# Patient Record
Sex: Male | Born: 1935 | Race: White | Hispanic: No | Marital: Married | Smoking: Never smoker
Health system: Southern US, Community
[De-identification: ages and names within clinical notes are randomized; demographics above are authoritative.]

## PROBLEM LIST (undated history)

## (undated) DIAGNOSIS — I4819 Other persistent atrial fibrillation: Secondary | ICD-10-CM

## (undated) DIAGNOSIS — N400 Enlarged prostate without lower urinary tract symptoms: Secondary | ICD-10-CM

## (undated) DIAGNOSIS — Z952 Presence of prosthetic heart valve: Secondary | ICD-10-CM

## (undated) DIAGNOSIS — E039 Hypothyroidism, unspecified: Secondary | ICD-10-CM

## (undated) DIAGNOSIS — I10 Essential (primary) hypertension: Secondary | ICD-10-CM

## (undated) DIAGNOSIS — I472 Ventricular tachycardia: Secondary | ICD-10-CM

## (undated) DIAGNOSIS — Z87442 Personal history of urinary calculi: Secondary | ICD-10-CM

## (undated) DIAGNOSIS — E785 Hyperlipidemia, unspecified: Secondary | ICD-10-CM

## (undated) DIAGNOSIS — M1990 Unspecified osteoarthritis, unspecified site: Secondary | ICD-10-CM

## (undated) DIAGNOSIS — I2510 Atherosclerotic heart disease of native coronary artery without angina pectoris: Secondary | ICD-10-CM

## (undated) DIAGNOSIS — G709 Myoneural disorder, unspecified: Secondary | ICD-10-CM

## (undated) DIAGNOSIS — K219 Gastro-esophageal reflux disease without esophagitis: Secondary | ICD-10-CM

## (undated) DIAGNOSIS — I4729 Other ventricular tachycardia: Secondary | ICD-10-CM

## (undated) DIAGNOSIS — I5022 Chronic systolic (congestive) heart failure: Secondary | ICD-10-CM

## (undated) DIAGNOSIS — I442 Atrioventricular block, complete: Secondary | ICD-10-CM

## (undated) DIAGNOSIS — G629 Polyneuropathy, unspecified: Secondary | ICD-10-CM

## (undated) DIAGNOSIS — L409 Psoriasis, unspecified: Secondary | ICD-10-CM

## (undated) HISTORY — DX: Psoriasis, unspecified: L40.9

## (undated) HISTORY — DX: Other ventricular tachycardia: I47.29

## (undated) HISTORY — DX: Ventricular tachycardia: I47.2

## (undated) HISTORY — DX: Atherosclerotic heart disease of native coronary artery without angina pectoris: I25.10

## (undated) HISTORY — PX: OTHER SURGICAL HISTORY: SHX169

## (undated) HISTORY — PX: KIDNEY STONE SURGERY: SHX686

## (undated) HISTORY — DX: Presence of prosthetic heart valve: Z95.2

## (undated) HISTORY — PX: CATARACT EXTRACTION, BILATERAL: SHX1313

## (undated) HISTORY — DX: Hyperlipidemia, unspecified: E78.5

## (undated) HISTORY — PX: LUMBAR DISC SURGERY: SHX700

## (undated) HISTORY — DX: Chronic systolic (congestive) heart failure: I50.22

## (undated) HISTORY — PX: CARPAL TUNNEL RELEASE: SHX101

## (undated) HISTORY — DX: Other persistent atrial fibrillation: I48.19

## (undated) HISTORY — PX: BACK SURGERY: SHX140

## (undated) HISTORY — DX: Essential (primary) hypertension: I10

## (undated) HISTORY — PX: APPENDECTOMY: SHX54

## (undated) HISTORY — PX: CORONARY ANGIOPLASTY WITH STENT PLACEMENT: SHX49

## (undated) HISTORY — DX: Atrioventricular block, complete: I44.2

## (undated) HISTORY — PX: ANTERIOR CERVICAL DECOMP/DISCECTOMY FUSION: SHX1161

## (undated) HISTORY — PX: TONSILLECTOMY: SUR1361

## (undated) HISTORY — PX: JOINT REPLACEMENT: SHX530

## (undated) HISTORY — DX: Benign prostatic hyperplasia without lower urinary tract symptoms: N40.0

---

## 1968-08-21 DIAGNOSIS — Z87442 Personal history of urinary calculi: Secondary | ICD-10-CM

## 1968-08-21 HISTORY — DX: Personal history of urinary calculi: Z87.442

## 1993-08-21 HISTORY — PX: CORONARY ARTERY BYPASS GRAFT: SHX141

## 2001-04-17 ENCOUNTER — Encounter: Payer: Self-pay | Admitting: Neurosurgery

## 2001-04-19 ENCOUNTER — Encounter: Payer: Self-pay | Admitting: Neurosurgery

## 2001-04-19 ENCOUNTER — Inpatient Hospital Stay (HOSPITAL_COMMUNITY): Admission: RE | Admit: 2001-04-19 | Discharge: 2001-04-20 | Payer: Self-pay | Admitting: Neurosurgery

## 2001-04-25 ENCOUNTER — Encounter: Admission: RE | Admit: 2001-04-25 | Discharge: 2001-04-25 | Payer: Self-pay | Admitting: Neurosurgery

## 2001-04-25 ENCOUNTER — Encounter: Payer: Self-pay | Admitting: Neurosurgery

## 2001-05-16 ENCOUNTER — Encounter: Payer: Self-pay | Admitting: Neurosurgery

## 2001-05-16 ENCOUNTER — Encounter: Admission: RE | Admit: 2001-05-16 | Discharge: 2001-05-16 | Payer: Self-pay | Admitting: Neurosurgery

## 2001-07-02 ENCOUNTER — Ambulatory Visit (HOSPITAL_COMMUNITY): Admission: RE | Admit: 2001-07-02 | Discharge: 2001-07-02 | Payer: Self-pay | Admitting: Internal Medicine

## 2001-08-29 ENCOUNTER — Other Ambulatory Visit: Admission: RE | Admit: 2001-08-29 | Discharge: 2001-08-29 | Payer: Self-pay | Admitting: Dermatology

## 2002-06-23 ENCOUNTER — Encounter: Payer: Self-pay | Admitting: Cardiology

## 2004-07-25 ENCOUNTER — Ambulatory Visit: Payer: Self-pay | Admitting: Cardiology

## 2004-09-21 HISTORY — PX: SYNOVIAL CYST EXCISION: SUR507

## 2004-09-21 HISTORY — PX: LUMBAR LAMINECTOMY: SHX95

## 2004-09-30 ENCOUNTER — Ambulatory Visit (HOSPITAL_COMMUNITY): Admission: RE | Admit: 2004-09-30 | Discharge: 2004-09-30 | Payer: Self-pay | Admitting: Internal Medicine

## 2004-10-18 ENCOUNTER — Inpatient Hospital Stay (HOSPITAL_COMMUNITY): Admission: RE | Admit: 2004-10-18 | Discharge: 2004-10-21 | Payer: Self-pay | Admitting: Neurosurgery

## 2005-01-20 ENCOUNTER — Ambulatory Visit: Payer: Self-pay | Admitting: Cardiology

## 2005-03-15 ENCOUNTER — Ambulatory Visit (HOSPITAL_COMMUNITY): Admission: RE | Admit: 2005-03-15 | Discharge: 2005-03-15 | Payer: Self-pay | Admitting: Internal Medicine

## 2005-07-25 ENCOUNTER — Ambulatory Visit: Payer: Self-pay | Admitting: Cardiology

## 2005-08-04 ENCOUNTER — Ambulatory Visit: Payer: Self-pay

## 2005-09-28 ENCOUNTER — Ambulatory Visit: Payer: Self-pay | Admitting: Cardiology

## 2005-11-07 ENCOUNTER — Ambulatory Visit: Payer: Self-pay | Admitting: Cardiology

## 2006-02-09 ENCOUNTER — Ambulatory Visit: Payer: Self-pay | Admitting: Cardiology

## 2006-05-15 ENCOUNTER — Ambulatory Visit (HOSPITAL_COMMUNITY): Admission: RE | Admit: 2006-05-15 | Discharge: 2006-05-15 | Payer: Self-pay | Admitting: Internal Medicine

## 2006-07-05 ENCOUNTER — Ambulatory Visit: Payer: Self-pay | Admitting: Cardiology

## 2006-08-06 ENCOUNTER — Ambulatory Visit: Payer: Self-pay | Admitting: Cardiology

## 2006-08-06 LAB — CONVERTED CEMR LAB
ALT: 41 units/L — ABNORMAL HIGH (ref 0–40)
AST: 36 units/L (ref 0–37)
Albumin: 4 g/dL (ref 3.5–5.2)
HDL: 29.7 mg/dL — ABNORMAL LOW (ref >39.0)
Total Bilirubin: 0.7 mg/dL (ref 0.3–1.2)
Triglyceride fasting, serum: 141 mg/dL (ref 0–149)

## 2007-01-17 ENCOUNTER — Ambulatory Visit: Payer: Self-pay | Admitting: *Deleted

## 2007-06-25 ENCOUNTER — Ambulatory Visit: Payer: Self-pay | Admitting: Cardiology

## 2007-07-15 ENCOUNTER — Encounter: Payer: Self-pay | Admitting: Cardiology

## 2007-07-15 ENCOUNTER — Ambulatory Visit: Payer: Self-pay

## 2008-01-09 ENCOUNTER — Ambulatory Visit: Payer: Self-pay | Admitting: *Deleted

## 2008-01-23 ENCOUNTER — Ambulatory Visit: Payer: Self-pay | Admitting: *Deleted

## 2008-01-30 ENCOUNTER — Ambulatory Visit (HOSPITAL_COMMUNITY): Admission: RE | Admit: 2008-01-30 | Discharge: 2008-01-30 | Payer: Self-pay | Admitting: *Deleted

## 2008-02-06 ENCOUNTER — Ambulatory Visit: Payer: Self-pay | Admitting: *Deleted

## 2008-02-19 HISTORY — PX: CAROTID ENDARTERECTOMY: SUR193

## 2008-02-24 ENCOUNTER — Ambulatory Visit: Payer: Self-pay | Admitting: *Deleted

## 2008-02-24 ENCOUNTER — Inpatient Hospital Stay (HOSPITAL_COMMUNITY): Admission: RE | Admit: 2008-02-24 | Discharge: 2008-02-25 | Payer: Self-pay | Admitting: *Deleted

## 2008-02-24 ENCOUNTER — Encounter (INDEPENDENT_AMBULATORY_CARE_PROVIDER_SITE_OTHER): Payer: Self-pay | Admitting: *Deleted

## 2008-03-12 ENCOUNTER — Ambulatory Visit: Payer: Self-pay | Admitting: *Deleted

## 2008-05-19 ENCOUNTER — Ambulatory Visit (HOSPITAL_BASED_OUTPATIENT_CLINIC_OR_DEPARTMENT_OTHER): Admission: RE | Admit: 2008-05-19 | Discharge: 2008-05-19 | Payer: Self-pay | Admitting: Orthopedic Surgery

## 2008-07-02 ENCOUNTER — Ambulatory Visit: Payer: Self-pay | Admitting: Cardiology

## 2008-08-20 ENCOUNTER — Ambulatory Visit: Payer: Self-pay | Admitting: *Deleted

## 2009-01-21 ENCOUNTER — Ambulatory Visit: Payer: Self-pay | Admitting: *Deleted

## 2009-03-03 ENCOUNTER — Encounter: Payer: Self-pay | Admitting: Cardiology

## 2009-06-10 ENCOUNTER — Encounter: Payer: Self-pay | Admitting: Cardiology

## 2009-06-11 ENCOUNTER — Ambulatory Visit: Payer: Self-pay | Admitting: Cardiology

## 2009-06-11 ENCOUNTER — Encounter (INDEPENDENT_AMBULATORY_CARE_PROVIDER_SITE_OTHER): Payer: Self-pay | Admitting: *Deleted

## 2009-06-11 DIAGNOSIS — Z87898 Personal history of other specified conditions: Secondary | ICD-10-CM | POA: Insufficient documentation

## 2009-06-14 ENCOUNTER — Ambulatory Visit: Payer: Self-pay | Admitting: Cardiology

## 2009-06-14 ENCOUNTER — Inpatient Hospital Stay (HOSPITAL_BASED_OUTPATIENT_CLINIC_OR_DEPARTMENT_OTHER): Admission: RE | Admit: 2009-06-14 | Discharge: 2009-06-14 | Payer: Self-pay | Admitting: Cardiology

## 2009-06-15 ENCOUNTER — Encounter: Payer: Self-pay | Admitting: Cardiology

## 2009-06-15 ENCOUNTER — Encounter (INDEPENDENT_AMBULATORY_CARE_PROVIDER_SITE_OTHER): Payer: Self-pay | Admitting: *Deleted

## 2009-06-15 LAB — CONVERTED CEMR LAB
Basophils Absolute: 0 10*3/uL (ref 0.0–0.1)
Calcium: 8.9 mg/dL (ref 8.4–10.5)
Creatinine, Ser: 1.1 mg/dL (ref 0.4–1.5)
Eosinophils Absolute: 0.1 10*3/uL (ref 0.0–0.7)
GFR calc non Af Amer: 69.14 mL/min (ref >60)
Glucose, Bld: 122 mg/dL — ABNORMAL HIGH (ref 70–99)
Hemoglobin: 14.9 g/dL (ref 13.0–17.0)
INR: 1 (ref 0.8–1.0)
Lymphocytes Relative: 17.6 % (ref 12.0–46.0)
Monocytes Relative: 7.7 % (ref 3.0–12.0)
Neutrophils Relative %: 73.2 % (ref 43.0–77.0)
Platelets: 164 10*3/uL (ref 150.0–400.0)
Prothrombin Time: 10.6 s (ref 9.1–11.7)
RDW: 14.2 % (ref 11.5–14.6)
Sodium: 136 meq/L (ref 135–145)

## 2009-06-17 ENCOUNTER — Ambulatory Visit: Payer: Self-pay | Admitting: Cardiology

## 2009-06-17 ENCOUNTER — Inpatient Hospital Stay (HOSPITAL_COMMUNITY): Admission: RE | Admit: 2009-06-17 | Discharge: 2009-06-18 | Payer: Self-pay | Admitting: Cardiology

## 2009-06-18 ENCOUNTER — Encounter: Payer: Self-pay | Admitting: Cardiology

## 2009-06-21 ENCOUNTER — Encounter: Payer: Self-pay | Admitting: Cardiology

## 2009-06-22 ENCOUNTER — Ambulatory Visit: Payer: Self-pay | Admitting: Cardiology

## 2009-07-05 ENCOUNTER — Ambulatory Visit: Payer: Self-pay | Admitting: Cardiology

## 2009-07-07 ENCOUNTER — Encounter (HOSPITAL_COMMUNITY): Admission: RE | Admit: 2009-07-07 | Discharge: 2009-08-06 | Payer: Self-pay | Admitting: Cardiology

## 2009-08-09 ENCOUNTER — Encounter (HOSPITAL_COMMUNITY): Admission: RE | Admit: 2009-08-09 | Discharge: 2009-08-20 | Payer: Self-pay | Admitting: Cardiology

## 2009-08-12 ENCOUNTER — Ambulatory Visit: Payer: Self-pay | Admitting: Surgery

## 2009-08-18 ENCOUNTER — Encounter: Payer: Self-pay | Admitting: Cardiology

## 2009-08-23 ENCOUNTER — Ambulatory Visit: Payer: Self-pay | Admitting: Cardiology

## 2009-08-23 ENCOUNTER — Encounter (HOSPITAL_COMMUNITY): Admission: RE | Admit: 2009-08-23 | Discharge: 2009-09-22 | Payer: Self-pay | Admitting: Cardiology

## 2009-11-01 ENCOUNTER — Encounter (HOSPITAL_COMMUNITY): Admission: RE | Admit: 2009-11-01 | Discharge: 2009-12-01 | Payer: Self-pay | Admitting: Cardiology

## 2009-11-29 ENCOUNTER — Encounter: Payer: Self-pay | Admitting: Cardiology

## 2009-12-02 ENCOUNTER — Encounter (HOSPITAL_COMMUNITY): Admission: RE | Admit: 2009-12-02 | Discharge: 2010-01-01 | Payer: Self-pay | Admitting: Cardiology

## 2009-12-03 ENCOUNTER — Encounter: Payer: Self-pay | Admitting: Cardiology

## 2009-12-17 ENCOUNTER — Encounter: Payer: Self-pay | Admitting: Cardiology

## 2010-02-14 ENCOUNTER — Encounter: Payer: Self-pay | Admitting: Cardiology

## 2010-02-14 ENCOUNTER — Ambulatory Visit: Payer: Self-pay | Admitting: Surgery

## 2010-03-03 ENCOUNTER — Ambulatory Visit: Payer: Self-pay | Admitting: Cardiology

## 2010-03-29 ENCOUNTER — Encounter: Payer: Self-pay | Admitting: Cardiology

## 2010-05-02 ENCOUNTER — Telehealth: Payer: Self-pay | Admitting: Cardiology

## 2010-05-23 ENCOUNTER — Encounter: Admission: RE | Admit: 2010-05-23 | Discharge: 2010-05-23 | Payer: Self-pay | Admitting: Orthopedic Surgery

## 2010-05-25 ENCOUNTER — Ambulatory Visit (HOSPITAL_BASED_OUTPATIENT_CLINIC_OR_DEPARTMENT_OTHER): Admission: RE | Admit: 2010-05-25 | Discharge: 2010-05-25 | Payer: Self-pay | Admitting: Orthopedic Surgery

## 2010-06-22 ENCOUNTER — Telehealth: Payer: Self-pay | Admitting: Cardiology

## 2010-07-13 ENCOUNTER — Encounter: Admission: RE | Admit: 2010-07-13 | Discharge: 2010-07-13 | Payer: Self-pay | Admitting: Neurosurgery

## 2010-08-10 ENCOUNTER — Inpatient Hospital Stay (HOSPITAL_COMMUNITY)
Admission: RE | Admit: 2010-08-10 | Discharge: 2010-08-13 | Payer: Self-pay | Source: Home / Self Care | Attending: Neurosurgery | Admitting: Neurosurgery

## 2010-08-17 ENCOUNTER — Ambulatory Visit: Payer: Self-pay | Admitting: Cardiology

## 2010-09-06 ENCOUNTER — Encounter: Payer: Self-pay | Admitting: Cardiology

## 2010-09-20 NOTE — Miscellaneous (Signed)
Summary: Rehab Report  Rehab Report   Imported By: Faythe Ghee 08/26/2009 13:08:37  _____________________________________________________________________  External Attachment:    Type:   Image     Comment:   External Document

## 2010-09-20 NOTE — Assessment & Plan Note (Signed)
Summary: f36m   Visit Type:  Follow-up Referring Provider:  Cindee Salt, MD  , Durene Cal, MD Primary Provider:  Dr. Carylon Perches  CC:  CAD.  History of Present Illness: The patient is seen for followup of coronary artery disease. He is doing well.  He has not had any recurrent angina.  His anginal symptom is a sensation of coolness in his throat.  He has not had any of this.  I saw him last in January, 2011.  He had a drug-eluting stent placed to the vein graft to the circumflex in October, 2010.  He is to remain on Plavix for one year.  His lipids are being treated aggressively by Dr.Fagan. He needs to have carpal tunnel surgery.  It will be safe to do this and hold his Plavix in October of this year.  Current Medications (verified): 1)  Lipitor 20 Mg Tabs (Atorvastatin Calcium) .Marland Kitchen.. 1 By Mouth Dialy 2)  Aspirin Ec 325 Mg Tbec (Aspirin) .... Take One Tablet By Mouth Daily 3)  Allopurinol 300 Mg Tabs (Allopurinol) .... 2 Tab Once Daily 4)  Synthroid 125 Mcg Tabs (Levothyroxine Sodium) .Marland Kitchen.. 1 Tab Once Daily 5)  Multivitamins   Tabs (Multiple Vitamin) .Marland Kitchen.. 1 Tab Once Daily 6)  Chlorthalidone 25 Mg Tabs (Chlorthalidone) .Marland Kitchen.. 1 Tab Once Daily 7)  Travatan Z 0.004 % Soln (Travoprost) .... Use As Directed 8)  Nabumetone 750 Mg Tabs (Nabumetone) .Marland Kitchen.. 1 Tab Two Times A Day 9)  Toprol Xl 100 Mg Xr24h-Tab (Metoprolol Succinate) .Marland Kitchen.. 1 1/2 Tab Once Daily 10)  Diovan 320 Mg Tabs (Valsartan) .Marland Kitchen.. 1 Tab Once Daily 11)  Depotestosterone .... Once Every 2 Weeks 12)  Tylenol Extra Strength 500 Mg Tabs (Acetaminophen) .... As Needed 13)  Glucosamine 500 Mg Caps (Glucosamine Sulfate) .Marland Kitchen.. 1 Cap Once Daily 14)  Potassium Chloride Crys Cr 20 Meq Cr-Tabs (Potassium Chloride Crys Cr) .... Take One Tablet By Mouth Twice Daily. 15)  Nitrostat 0.4 Mg Subl (Nitroglycerin) .Marland Kitchen.. 1 Tablet Under Tongue At Onset of Chest Pain; You May Repeat Every 5 Minutes For Up To 3 Doses. 16)  Plavix 75 Mg Tabs (Clopidogrel  Bisulfate) .... Take One Tablet By Mouth Daily 17)  Vitamin B-6 100 Mg Tabs (Pyridoxine Hcl) .... Take One Tablet By Mouth Once Daily. 18)  Omeprazole 20 Mg Cpdr (Omeprazole) .... Take One Tablet By Mouth Once Daily.  Allergies (verified): 1)  ! * Propaline  Past History:  Past Medical History: HYPERLIPIDEMIA (ICD-272.4) BENIGN PROSTATIC HYPERTROPHY, HX OF (ICD-V13.8) HYPERTENSION (ICD-401.9) meds adjusted Dr. [name] CHEST PAIN, EXERTIONAL (ICD-786.50) CAROTID ARTERY DISEASE (ICD-433.10) Carotid stenosis status post recent left carotid endarterectomy Dr. Madilyn Fireman Ejection fraction 55%  /  60% cardiac catheterization June 14, 2009 CAD, UNSPECIFIED SITE (ICD-414.00) stress Myoview November 2009 no ischemia...  /  DES  to SVG to the marginal branch of the circumflex... June 17, 2009 ( SVG up  PDA & PLA.. patent... LIMA to the LAD patent.. EF 60%.  Mild inferior hypokinesis) volume overload ...mild..historically p.r.n. diuretic History of renal stones Syncope.. remote.Marland Kitchen etiology unknown Severe psoriasis with total skin exfoliation in the past resolved Need for carpal tunnel surgery  in October, 2011  Review of Systems       Patient denies fever, chills, headache, sweats, rash, change in vision, change in hearing, chest pain, cough, nausea vomiting, urinary symptoms.  All other systems are reviewed and are negative.  Vital Signs:  Patient profile:   77 year old male Height:  70 inches Weight:      191 pounds BMI:     27.50 Pulse rate:   60 / minute Resp:     16 per minute BP sitting:   122 / 68  (left arm)  Vitals Entered By: Marrion Coy, CNA (March 03, 2010 3:44 PM)  Physical Exam  General:  Patient is stable today. Head:  head is atraumatic. Eyes:  no xanthelasma. Neck:  no jugular venous distention. Chest Wall:  no chest wall tenderness. Lungs:  lungs are clear.  Respiratory effort is nonlabored. Heart:  cardiac exam reveals S1-S2.  No clicks or significant  murmurs. Abdomen:  abdomen is soft. Msk:  no musculoskeletal deformities. Extremities:  no peripheral edema. Skin:  no skin rashes. Psych:  patient is oriented to person time and place.  Affect is normal.   Impression & Recommendations:  Problem # 1:  CAD (ICD-414.00)  Coronary disease is stable.  EKG is done and reviewed by me.  There is normal sinus rhythm.  No further workup is needed.  Orders: EKG w/ Interpretation (93000)  Problem # 2:  FLUID OVERLOAD (ICD-276.6) Status Is Stable.  No Change in Meds Needed.  Problem # 3:  HYPERLIPIDEMIA (ICD-272.4)  His updated medication list for this problem includes:    Lipitor 20 Mg Tabs (Atorvastatin calcium) .Marland Kitchen... 1 by mouth dialy The patient's Lipitor was recently increased.  He is following with Dr.Fagan.  Problem # 4:  HYPERTENSION (ICD-401.9) Blood pressure is controlled.  No change in therapy.  Problem # 5:  CAROTID ARTERY DISEASE (ICD-433.10) The patient has carotid artery disease.  He had seen Dr.Hayes in the past.  He is now being followed by Dr.Brabham.  Problem # 6:  * CLEARANCE FOR CARPAL TUNNEL SURGERY The patient is cleared to have carpal tunnel surgery in October, 2011.  At that time it will be safe to hold his Plavix for 5-7 days before the surgery and then restart the Plavix after his surgery.  Patient Instructions: 1)  Your physician wants you to follow-up in:  6 months.  You will receive a reminder letter in the mail two months in advance. If you don't receive a letter, please call our office to schedule the follow-up appointment.

## 2010-09-20 NOTE — Progress Notes (Signed)
Summary: re going off plavix for tooth extraction  Phone Note Call from Patient   Caller: Patient [phone] Reason for Call: Talk to Nurse Summary of Call: having tooth pulled-does he need to go off plavix? Initial call taken by: Glynda Jaeger,  June 22, 2010 10:28 AM  Follow-up for Phone Call        spoke w/pt advised we do not hold plavix for tooth extraction Meredith Staggers, RN  June 22, 2010 11:24 AM

## 2010-09-20 NOTE — Miscellaneous (Signed)
Summary: [name] Progress Report  [name] Progress Report   Imported By: Roderic Ovens 01/06/2010 11:06:40  _____________________________________________________________________  External Attachment:    Type:   Image     Comment:   External Document

## 2010-09-20 NOTE — Progress Notes (Signed)
Summary: pt needs surgical clearence  Phone Note Call from Patient Call back at [phone]   Caller: Patient Reason for Call: Talk to Nurse, Talk to Doctor Summary of Call: pt is have surgery on elbow and wrist on 10/5 and needs to come off plavix and ASA 5 days prior and also needs a letter of clearence for this surgery Initial call taken by: Omer Jack,  May 02, 2010 12:03 PM  Follow-up for Phone Call        pt last seen 03/03/10, will send to Dr Myrtis Ser for review Meredith Staggers, RN  May 02, 2010 3:19 PM   Additional Follow-up for Phone Call Additional follow up Details #1::        In October he will be one year post drug-eluting stent.  In October his Plavix can be held for 5 days before carpal tunnel surgery with the Plavix resumed the day after the procedure.  His aspirin should be continued.  Talitha Givens, MD, [facility]  May 02, 2010 3:29 PM     Additional Follow-up for Phone Call Additional follow up Details #2::    pt is aware, note faxed to Dr Glena Norfolk, RN  May 02, 2010 4:30 PM

## 2010-09-20 NOTE — Assessment & Plan Note (Signed)
Summary: f/u 1st week january/sl   Visit Type:  Follow-up Primary Provider:  Dr. Carylon Perches  CC:  CAD.  History of Present Illness: The patient is seen for followup of coronary artery disease.  He had an intervention June 17, 2009.  He has done well.  He had some mild symptoms afterwards and we'll watch him carefully.  He is now fully active and has only question of very slight symptoms if he is walking hard and very cold weather.  He sounds quite good.  Current Medications (verified): 1)  Lipitor 10 Mg Tabs (Atorvastatin Calcium) .Marland Kitchen.. 1 Tab Once Daily 2)  Aspirin Ec 325 Mg Tbec (Aspirin) .... Take One Tablet By Mouth Daily 3)  Allopurinol 300 Mg Tabs (Allopurinol) .... 2 Tab Once Daily 4)  Synthroid 125 Mcg Tabs (Levothyroxine Sodium) .Marland Kitchen.. 1 Tab Once Daily 5)  Multivitamins   Tabs (Multiple Vitamin) .Marland Kitchen.. 1 Tab Once Daily 6)  Chlorthalidone 25 Mg Tabs (Chlorthalidone) .Marland Kitchen.. 1 Tab Once Daily 7)  Travatan Z 0.004 % Soln (Travoprost) .... Use As Directed 8)  Nabumetone 750 Mg Tabs (Nabumetone) .Marland Kitchen.. 1 Tab Two Times A Day 9)  Toprol Xl 100 Mg Xr24h-Tab (Metoprolol Succinate) .Marland Kitchen.. 1 1/2 Tab Once Daily 10)  Diovan 320 Mg Tabs (Valsartan) .Marland Kitchen.. 1 Tab Once Daily 11)  Depotestosterone .... Once Every 4 Weeks 12)  Tylenol Extra Strength 500 Mg Tabs (Acetaminophen) .... As Needed 13)  Glucosamine 500 Mg Caps (Glucosamine Sulfate) .Marland Kitchen.. 1 Cap Once Daily 14)  Potassium Chloride Crys Cr 20 Meq Cr-Tabs (Potassium Chloride Crys Cr) .... Take One Tablet By Mouth Twice Daily. 15)  Nitrostat 0.4 Mg Subl (Nitroglycerin) .Marland Kitchen.. 1 Tablet Under Tongue At Onset of Chest Pain; You May Repeat Every 5 Minutes For Up To 3 Doses. 16)  Plavix 75 Mg Tabs (Clopidogrel Bisulfate) .... Take One Tablet By Mouth Daily 17)  Vitamin B-6 100 Mg Tabs (Pyridoxine Hcl) .... Take One Tablet By Mouth Once Daily. 18)  Omeprazole 20 Mg Cpdr (Omeprazole) .... Take One Tablet By Mouth Once Daily.  Allergies: 1)  ! * Propaline  Past  History:  Past Medical History: Last updated: 06/21/2009 HYPERLIPIDEMIA (ICD-272.4) BENIGN PROSTATIC HYPERTROPHY, HX OF (ICD-V13.8) HYPERTENSION (ICD-401.9) meds adjusted Dr. [name] CHEST PAIN, EXERTIONAL (ICD-786.50) CAROTID ARTERY DISEASE (ICD-433.10) Carotid stenosis status post recent left carotid endarterectomy Dr. Madilyn Fireman Ejection fraction 55%  /  60% cardiac catheterization June 14, 2009 CAD, UNSPECIFIED SITE (ICD-414.00) stress Myoview November 2009 no ischemia...  /  DES  to SVG to the marginal branch of the circumflex... June 17, 2009 ( SVG up  PDA & PLA.. patent... LIMA to the LAD patent.. EF 60%.  Mild inferior hypokinesis) volume overload ...mild..historically p.r.n. diuretic History of renal stones Syncope.. remote.Marland Kitchen etiology unknown Severe psoriasis with total skin exfoliation in the past resolved  Review of Systems       Patient denies fever, chills, headache, sweats, rash, change in vision, change in hearing, chest pain, shortness of breath, cough, nausea vomiting, urinary symptoms.  All other systems are reviewed and are negative.  Vital Signs:  Patient profile:   77 year old male Height:      70 inches Weight:      191 pounds BMI:     27.50 Pulse rate:   50 / minute BP sitting:   127 / 65  (left arm) Cuff size:   regular  Vitals Entered By: Oswald Hillock (August 23, 2009 10:07 AM)  Physical Exam  General:  patient is stable. Eyes:  no xanthelasma. Neck:  no jugular venous distention. Lungs:  lungs are clear.  Respiratory effort is not labored. Heart:  cardiac exam reveals S1 and S2.  There is a soft systolic murmur. Abdomen:  abdomen is soft. Msk:  no musculoskeletal deformities. Extremities:  no peripheral edema. Skin:  no skin rashes. Psych:  patient is oriented to person time and place.  Affect is normal.   Impression & Recommendations:  Problem # 1:  FLUID OVERLOAD (ICD-276.6) Volume status is stable.  No change in therapy.  Problem  # 2:  CAD (ICD-414.00) Coronary disease is stable.  He is back to full activity.  No testing is needed.  We'll see him back in 6 months.  He asked about the use of Viagra.  I approved it as long as he does not use nitroglycerin and we discussed this carefully.  Problem # 3:  SYNCOPE (ICD-780.2) Patient has had slight dizziness but this is probably vertigo.  No change in therapy.  Patient Instructions: 1)  Follow up in 6 months

## 2010-09-20 NOTE — Letter (Signed)
Summary: [facility]   [facility]   Imported By: Roderic Ovens 03/05/2010 10:21:43  _____________________________________________________________________  External Attachment:    Type:   Image     Comment:   External Document

## 2010-09-20 NOTE — Miscellaneous (Signed)
Summary: [name]  [name]   Imported By: Roderic Ovens 12/21/2009 13:23:38  _____________________________________________________________________  External Attachment:    Type:   Image     Comment:   External Document

## 2010-09-22 NOTE — Miscellaneous (Signed)
  Clinical Lists Changes  Observations: Added new observation of PAST MED HX: HYPERLIPIDEMIA (ICD-272.4) BENIGN PROSTATIC HYPERTROPHY, HX OF (ICD-V13.8) HYPERTENSION (ICD-401.9) meds adjusted Dr. [name] CHEST PAIN, EXERTIONAL (ICD-786.50) CAROTID ARTERY DISEASE (ICD-433.10) Carotid stenosis status post recent left carotid endarterectomy Dr. Madilyn Fireman Ejection fraction 55%  /  60% cardiac catheterization June 14, 2009 CAD, UNSPECIFIED SITE (ICD-414.00) stress Myoview November 2009 no ischemia...  /  DES  to SVG to the marginal branch of the circumflex... June 17, 2009 ( SVG up  PDA & PLA.. patent... LIMA to the LAD patent.. EF 60%.  Mild inferior hypokinesis) volume overload ...mild..historically p.r.n. diuretic History of renal stones Syncope.. remote.Marland Kitchen etiology unknown Severe psoriasis with total skin exfoliation in the past resolved Need for carpal tunnel surgery  in October, 2011 Lumbar laminectomy L2-3, L3-4 with posterior lateral arthrodesis   Dr. Jeral Fruit... December, 2011  (09/06/2010 13:37) Added new observation of REFERRING MD: Cindee Salt, MD  , Durene Cal, MD (09/06/2010 13:37) Added new observation of PRIMARY MD: Dr. Carylon Perches (09/06/2010 13:37)       Past History:  Past Medical History: HYPERLIPIDEMIA (ICD-272.4) BENIGN PROSTATIC HYPERTROPHY, HX OF (ICD-V13.8) HYPERTENSION (ICD-401.9) meds adjusted Dr. [name] CHEST PAIN, EXERTIONAL (ICD-786.50) CAROTID ARTERY DISEASE (ICD-433.10) Carotid stenosis status post recent left carotid endarterectomy Dr. Madilyn Fireman Ejection fraction 55%  /  60% cardiac catheterization June 14, 2009 CAD, UNSPECIFIED SITE (ICD-414.00) stress Myoview November 2009 no ischemia...  /  DES  to SVG to the marginal branch of the circumflex... June 17, 2009 ( SVG up  PDA & PLA.. patent... LIMA to the LAD patent.. EF 60%.  Mild inferior hypokinesis) volume overload ...mild..historically p.r.n. diuretic History of renal stones Syncope.. remote.Marland Kitchen  etiology unknown Severe psoriasis with total skin exfoliation in the past resolved Need for carpal tunnel surgery  in October, 2011 Lumbar laminectomy L2-3, L3-4 with posterior lateral arthrodesis   Dr. Jeral Fruit... December, 2011

## 2010-09-22 NOTE — Assessment & Plan Note (Signed)
Summary: [name]   Visit Type:  Follow-up Referring Provider:  Hilda Lias, MD  Cindee Salt, MD  , Durene Cal, MD Primary Provider:  Dr. Carylon Perches  CC:  CAD.  History of Present Illness: The patient is seen for followup coronary artery disease.  I saw him last July, 2011 he received a drug-eluting stent to a vein graft to the circumflex in October, 2010.  He was on aspirin and Plavix for greater than one year.  Recently he underwent laminectomy at L2-3, and L3-4.  He has done well.  He did have some wheezing and his aspirin and Plavix have remained on hold.  He will be seeing his doctor in 2 days and I'm hopeful that his aspirin would actually be restarted.  His Plavix can be held if necessary for another week or 2.  Is not having any chest pain or shortness of breath.  Current Medications (verified): 1)  Lipitor 20 Mg Tabs (Atorvastatin Calcium) .Marland Kitchen.. 1 By Mouth Dialy 2)  Aspirin Ec 325 Mg Tbec (Aspirin) .... Take One Tablet By Mouth Daily On Hold 3)  Allopurinol 300 Mg Tabs (Allopurinol) .... 2 Tab Once Daily 4)  Synthroid 125 Mcg Tabs (Levothyroxine Sodium) .Marland Kitchen.. 1 Tab Once Daily 5)  Multivitamins   Tabs (Multiple Vitamin) .Marland Kitchen.. 1 Tab Once Daily 6)  Chlorthalidone 25 Mg Tabs (Chlorthalidone) .Marland Kitchen.. 1 Tab Once Daily 7)  Travatan Z 0.004 % Soln (Travoprost) .... Use As Directed 8)  Toprol Xl 100 Mg Xr24h-Tab (Metoprolol Succinate) .Marland Kitchen.. 1 1/2 Tab Once Daily 9)  Diovan 320 Mg Tabs (Valsartan) .Marland Kitchen.. 1 Tab Once Daily 10)  Depotestosterone .... Once Every 2 Weeks 11)  Tylenol Extra Strength 500 Mg Tabs (Acetaminophen) .... As Needed 12)  Glucosamine 500 Mg Caps (Glucosamine Sulfate) .Marland Kitchen.. 1 Cap Once Daily 13)  Potassium Chloride Crys Cr 20 Meq Cr-Tabs (Potassium Chloride Crys Cr) .... Take One Tablet By Mouth Twice Daily. 14)  Nitrostat 0.4 Mg Subl (Nitroglycerin) .Marland Kitchen.. 1 Tablet Under Tongue At Onset of Chest Pain; You May Repeat Every 5 Minutes For Up To 3 Doses. 15)  Plavix 75 Mg Tabs (Clopidogrel  Bisulfate) .... Take One Tablet By Mouth Daily On Hold 16)  Vitamin B-6 100 Mg Tabs (Pyridoxine Hcl) .... Take One Tablet By Mouth Once Daily. 17)  Omeprazole 20 Mg Cpdr (Omeprazole) .... Take One Tablet By Mouth Once Daily.  Allergies (verified): 1)  ! * Propaline  Past History:  Past Medical History: HYPERLIPIDEMIA (ICD-272.4) BENIGN PROSTATIC HYPERTROPHY, HX OF (ICD-V13.8) HYPERTENSION (ICD-401.9) meds adjusted Dr. [name] CHEST PAIN, EXERTIONAL (ICD-786.50) CAROTID ARTERY DISEASE (ICD-433.10) Carotid stenosis status post recent left carotid endarterectomy Dr. Madilyn Fireman Ejection fraction 55%  /  60% cardiac catheterization June 14, 2009 CAD, UNSPECIFIED SITE (ICD-414.00) stress Myoview November 2009 no ischemia...  /  DES  to SVG to the marginal branch of the circumflex... June 17, 2009 ( SVG up  PDA & PLA.. patent... LIMA to the LAD patent.. EF 60%.  Mild inferior hypokinesis) volume overload ...mild..historically p.r.n. diuretic History of renal stones Syncope.. remote.Marland Kitchen etiology unknown Severe psoriasis with total skin exfoliation in the past resolved Need for carpal tunnel surgery  in October, 2011 Lumbar laminectomy L2-3, L3-4 with posterior lateral arthrodesis   Dr. Jeral Fruit... December, 2011 Murmur   systolic.... January, 2012  Review of Systems       Patient denies fever, chills, headache, sweats, rash, change in vision, change chest pain, cough, nausea vomiting, urinary symptoms.  All the systems are reviewed  and are negative.  Vital Signs:  Patient profile:   77 year old male Height:      70 inches Weight:      188 pounds BMI:     27.07 Pulse rate:   70 / minute BP sitting:   118 / 60  (left arm) Cuff size:   regular  Vitals Entered By: Hardin Negus, RMA (September 06, 2010 3:17 PM)  Physical Exam  General:  patient is quite stable. Eyes:  no xanthelasma. Neck:  no jugular venous distention. Lungs:  lungs are clear respiratory effort is nonlabored. Heart:   cardiac exam reveals S1-S2.  There is a 2/6 systolic murmur. Abdomen:  abdomen is soft Extremities:  no peripheral edema. Psych:  patient is oriented to person time and place.  Affect is normal.   Impression & Recommendations:  Problem # 1:  MURMUR (ICD-785.2)  His updated medication list for this problem includes:    Chlorthalidone 25 Mg Tabs (Chlorthalidone) .Marland Kitchen... 1 tab once daily    Toprol Xl 100 Mg Xr24h-tab (Metoprolol succinate) .Marland Kitchen... 1 1/2 tab once daily    Diovan 320 Mg Tabs (Valsartan) .Marland Kitchen... 1 tab once daily    Nitrostat 0.4 Mg Subl (Nitroglycerin) .Marland Kitchen... 1 tablet under tongue at onset of chest pain; you may repeat every 5 minutes for up to 3 doses. The patient has a systolic murmur today.  I will follow this over time and consider followup echo at some point.  Problem # 2:  CAD (ICD-414.00)  His updated medication list for this problem includes:    Aspirin Ec 325 Mg Tbec (Aspirin) .Marland Kitchen... Take one tablet by mouth daily on hold    Toprol Xl 100 Mg Xr24h-tab (Metoprolol succinate) .Marland Kitchen... 1 1/2 tab once daily    Nitrostat 0.4 Mg Subl (Nitroglycerin) .Marland Kitchen... 1 tablet under tongue at onset of chest pain; you may repeat every 5 minutes for up to 3 doses.    Plavix 75 Mg Tabs (Clopidogrel bisulfate) .Marland Kitchen... Take one tablet by mouth daily on hold Coronary disease is stable.  No change in therapy.  I hoped that he will be back on his aspirin and Plavix soon.  Problem # 3:  FLUID OVERLOAD (ICD-276.6) The patient had an episode of fluid overload after he left the hospital.  He has had unusual intermittent episodes in the past.  Each time the diuresis easily in and does not require an ongoing diuretic.  This was all managed by Dr.Fagan and he is stable  Problem # 4:  CAROTID ARTERY DISEASE (ICD-433.10)  His updated medication list for this problem includes:    Aspirin Ec 325 Mg Tbec (Aspirin) .Marland Kitchen... Take one tablet by mouth daily on hold    Plavix 75 Mg Tabs (Clopidogrel bisulfate) .Marland Kitchen... Take  one tablet by mouth daily on hold carotid disease is stable.  No change in therapy  Patient Instructions: 1)  Your physician wants you to follow-up in:  6 months.  You will receive a reminder letter in the mail two months in advance. If you don't receive a letter, please call our office to schedule the follow-up appointment.

## 2010-10-10 NOTE — Op Note (Signed)
[name], [name]               ACCOUNT NO.:  [id number]  MEDICAL RECORD NO.:  [id number]          [name] TYPE:  AMB  LOCATION:  DSC                          FACILITY:  [facility]  PHYSICIAN:  Cindee Salt, M.D.       DATE OF BIRTH:  [birth year]  DATE OF PROCEDURE:  05/25/2010 DATE OF DISCHARGE:                              OPERATIVE REPORT   PREOPERATIVE DIAGNOSES:  Carpal tunnel syndrome right hand, cubital tunnel syndrome right elbow.  POSTOPERATIVE DIAGNOSES:  Carpal tunnel syndrome right hand, cubital tunnel syndrome right elbow.  OPERATION:  Carpal tunnel release right hand, decompression ulnar nerve right elbow.  SURGEON.:  Cindee Salt, MD  ASSISTANT:  None.  ANESTHESIA:  General with local infiltration.  ANESTHESIOLOGIST:  Janetta Hora. Gelene Mink, MD  HISTORY:  The [name] is a 77 year old male with a history of bilateral carpal tunnel syndrome, ulnar neuropathies at the elbow.  These have not responded to conservative treatment.  Nerve conductions are positive at each location.  He has undergone release on his left and is admitted now for release on his right.  Pre, peri and postoperative course have been discussed along with risks and complications.  He is aware there is no guarantee with the surgery, possibility of infection, recurrence injury to arteries, nerves, tendons, incomplete relief of symptoms and dystrophy.  In the preoperative area, the [name] is seen.  The extremity marked by both the [name] and surgeon.  Antibiotic given.  PROCEDURE:  The [name] was brought to the operating room where a general anesthetic was carried out without difficulty under the direction of Dr. Gelene Mink.  He was prepped using ChloraPrep, supine position with the right arm free.  A 3-minute dry time was allowed. Time-out taken confirming the [name] and procedure.  The limb was exsanguinated with an Esmarch bandage, tourniquet placed high and the arm was inflated to 250 mmHg.  A  longitudinal incision was made in the palm and carried down through subcutaneous tissue.  Bleeders were electrocauterized.  Palmar fascia was split, superficial palmar arch identified, flexor tendon to the ring and little finger identified.  To the ulnar side of the median nerve the carpal retinaculum was incised with sharp dissection.  A right-angle and Sewall retractor were placed between skin and forearm fascia.  The fascia released for approximately a centimeter and half proximal to the wrist crease.  Canal was explored. Area compression to the nerve was apparent.  No further lesions were identified.  The wound was irrigated.  Skin closed with interrupted 5-0 Vicryl Rapide sutures.  A separate incision was then made over the medial epicondyle of the right elbow and carried down through subcutaneous tissue.  Bleeders again electrocauterized with bipolar. The posterior branches of the medial antebrachial cutaneous nerve in the forearm were identified and protected.  An anconeus epitrochlearis muscle was found.  This was detached from its most posterior aspect. The ulnar nerve was identified.  This was then traced distally.  The two muscle bellies of the flexor carpi ulnaris were split.  Two new retractors were placed allowing visualization of the subcutaneous fascia interval after blunt dissection.  This  allowed release of the fascia for approximately 6 cm to 7 cm distally.  The muscle belly of the flexor carpi ulnaris was then split.  A KMI carpal tunnel retractor skid was then placed using the EMT scissors.  The deep fascia was then cut protecting the ulnar nerve.  Attention was then directed proximally. The fascia again dissected free from the underlying fascia.  The two knee retractors were placed allowing visualization of the fascia and the fascial release was then performed.  The muscle dissected from the medial intermuscular septum and a release of the deep fascia was  then performed again with protection to the ulnar nerve using KMI carpal tunnel skid.  The nerve was fully released at this point in time proximally and distally.  Full flexion revealed no subluxation anteriorly of the nerve.  The wound was copiously irrigated with saline. The anconeus epitrochlearis was then sutured to the dorsal skin flap, subcutaneous tissue, deep fascia and the subcutaneous tissue closed with interrupted 2-0 Vicryl and the skin with interrupted 4-0 Vicryl Rapide sutures.  A sterile compressive dressing long-arm splint with the elbow flexed approximately 30 degrees was applied.  Deflation of the tourniquet all fingers immediately pinked.  He was taken to the recovery room for observation in satisfactory condition.  He will be discharged home to return to the [facility] in 1 week on Vicodin.          ______________________________ Cindee Salt, M.D.     GK/MEDQ  D:  05/25/2010  T:  05/26/2010  Job:  [id number]  cc:   Kingsley Callander. [name], MD  Electronically Signed by Cindee Salt M.D. on 10/10/2010 12:11:30 PM

## 2010-10-31 LAB — CBC
Hemoglobin: 15.7 g/dL (ref 13.0–17.0)
Platelets: 163 10*3/uL (ref 150–400)
RBC: 4.79 MIL/uL (ref 4.22–5.81)

## 2010-10-31 LAB — BASIC METABOLIC PANEL
Calcium: 9.6 mg/dL (ref 8.4–10.5)
Creatinine, Ser: 1.26 mg/dL (ref 0.4–1.5)
GFR calc Af Amer: >60 mL/min (ref >60)
GFR calc non Af Amer: 56 mL/min — ABNORMAL LOW (ref >60)
Sodium: 136 mEq/L (ref 135–145)

## 2010-10-31 LAB — SURGICAL PCR SCREEN
MRSA, PCR: NEGATIVE
Staphylococcus aureus: NEGATIVE

## 2010-11-02 LAB — BASIC METABOLIC PANEL
BUN: 14 mg/dL (ref 6–23)
Calcium: 9.2 mg/dL (ref 8.4–10.5)
GFR calc non Af Amer: >60 mL/min (ref >60)
Potassium: 3.9 mEq/L (ref 3.5–5.1)
Sodium: 137 mEq/L (ref 135–145)

## 2010-11-02 LAB — POCT HEMOGLOBIN-HEMACUE: Hemoglobin: 15.5 g/dL (ref 13.0–17.0)

## 2010-11-24 LAB — CBC
HCT: 45.6 % (ref 39.0–52.0)
Platelets: 144 10*3/uL — ABNORMAL LOW (ref 150–400)
Platelets: 149 10*3/uL — ABNORMAL LOW (ref 150–400)
RBC: 4.06 MIL/uL — ABNORMAL LOW (ref 4.22–5.81)
RDW: 14.6 % (ref 11.5–15.5)
WBC: 9.2 10*3/uL (ref 4.0–10.5)
WBC: 9.9 10*3/uL (ref 4.0–10.5)

## 2010-11-24 LAB — BASIC METABOLIC PANEL
BUN: 14 mg/dL (ref 6–23)
BUN: 17 mg/dL (ref 6–23)
Calcium: 9.3 mg/dL (ref 8.4–10.5)
Creatinine, Ser: 1.13 mg/dL (ref 0.4–1.5)
GFR calc Af Amer: >60 mL/min (ref >60)
GFR calc non Af Amer: 56 mL/min — ABNORMAL LOW (ref >60)
GFR calc non Af Amer: >60 mL/min (ref >60)
Glucose, Bld: 116 mg/dL — ABNORMAL HIGH (ref 70–99)
Potassium: 3.8 mEq/L (ref 3.5–5.1)
Sodium: 132 mEq/L — ABNORMAL LOW (ref 135–145)

## 2011-01-03 NOTE — Procedures (Signed)
CAROTID DUPLEX EXAM   INDICATION:  Preop carotid endarterectomy.   HISTORY:  Diabetes:  No.  Cardiac:  CABG.  Hypertension:  Yes.  Smoking:  No.  Previous Surgery:  Left carotid endarterectomy and Dacron patch  angioplasty 02/2008.  CV History:  No.  Amaurosis Fugax No, Paresthesias No, Hemiparesis No                                       RIGHT             LEFT  Brachial systolic pressure:         116               154  Brachial Doppler waveforms:         Within normal limits                Within normal limits  Vertebral direction of flow:        Antegrade         Antegrade  DUPLEX VELOCITIES (cm/sec)  CCA peak systolic                   120               119  ECA peak systolic                   270               192  ICA peak systolic                   226               99  ICA end diastolic                   53                24  PLAQUE MORPHOLOGY:                  Calcified  PLAQUE AMOUNT:                      Moderate  PLAQUE LOCATION:                    CCA, bifurcation, ICA and ECA   IMPRESSION:  1. Sonographic evidence of 40-59% stenosis of the right internal      carotid artery.  2. Patent left internal carotid artery with no restenosis.  3. Bilateral external carotid artery stenosis.   ___________________________________________  P. Liliane Bade, M.D.   AC/MEDQ  D:  08/20/2008  T:  08/20/2008  Job:  [id number]

## 2011-01-03 NOTE — Procedures (Signed)
CAROTID DUPLEX EXAM   INDICATION:  Follow-up of known carotid artery disease.   HISTORY:  Diabetes:  No.  Cardiac:  CABG.  Hypertension:  Yes.  Smoking:  No.  Previous Surgery:  Left carotid endarterectomy with Dacron patch  angioplasty on 02/2008.  CV History:  No.  Amaurosis Fugax No, Paresthesias No, Hemiparesis No.                                       RIGHT             LEFT  Brachial systolic pressure:         156               150  Brachial Doppler waveforms:         WNL               WNL  Vertebral direction of flow:        Antegrade         Antegrade  DUPLEX VELOCITIES (cm/sec)  CCA peak systolic                   100               139  ECA peak systolic                   214               150  ICA peak systolic                   196               90  ICA end diastolic                   53                25  PLAQUE MORPHOLOGY:                  Calcified         Homogeneous, soft  PLAQUE AMOUNT:                      Moderate          Mild  PLAQUE LOCATION:                    BIF, ICA, ECA     BIF, ICA   IMPRESSION:  1. 40-59% right internal carotid artery stenosis.  2. 20-39% left internal carotid artery restenosis.       ___________________________________________  P. Liliane Bade, M.D.   AC/MEDQ  D:  01/21/2009  T:  01/21/2009  Job:  [id number]

## 2011-01-03 NOTE — Discharge Summary (Signed)
NAMECHONG, WOJDYLA               ACCOUNT NO.:  [id number]   MEDICAL RECORD NO.:  [id number]          PATIENT TYPE:  INP   LOCATION:  3301                         FACILITY:  [facility]   PHYSICIAN:  Balinda Quails, M.D.    DATE OF BIRTH:  [birth year]   DATE OF ADMISSION:  02/24/2008  DATE OF DISCHARGE:  02/25/2008                               DISCHARGE SUMMARY   ADMISSION DIAGNOSIS:  Severe progressive left internal carotid artery  stenosis, asymptomatic.   DISCHARGE/SECONDARY DIAGNOSES:  1. Severe progressive left internal carotid artery stenosis,      asymptomatic status post left carotid endarterectomy.  2. Hypertension.  3. History of gout.  4. Hyperlipidemia.  5. Hypothyroidism.  6. No known drug allergies.  7. Coronary artery disease followed at [facility].  8. History of nephrolithiasis.  9. Benign prostatic hyperplasia.  10.History of syncope of unknown origin.  11.Ejection fraction of 55%.  12.History of severe psoriasis, treated at [facility].  13.History of bilateral L3-4 laminectomy, removal of synovial cyst,      posterior arthrodesis with autograft and allograft by Dr. Jeral Fruit in      February 2006.  14.Postoperative hypokalemia, supplemented.  15.Postoperative hyponatremia (sodium of 126), asymptomatic).   PROCEDURES:  On February 24, 2008, left carotid endarterectomy with Dacron  patch angioplasty by Dr. Denman George.   BRIEF HISTORY:  Mr. Fonda is a 77 year old male followed by Dr. Madilyn Fireman  for known carotid disease.  Recently, he presented for routine followup  with evidence of progression of his carotid disease.  He underwent  carotid duplex scan along with a CT angiogram verified a severe left  internal carotid artery stenosis.  Subsequently, Dr. Madilyn Fireman recommended  that he undergo elective left carotid endarterectomy to reduce his risk  for future stroke.   HOSPITAL COURSE:  Mr. Denardo was electively admitted to [facility] on February 24, 2008.  He underwent the previously-mentioned  procedure.  After short-stay in recovery, he was transferred to step-  down unit 3300 where he remained until discharge.  Of note  intraoperatively, Dr. Madilyn Fireman did obtain incidental left mid jugular lymph  node, which he sent to biology and we will review any significant size  of the patient on his followup.  Mr. Demello had uneventful hospital  course.  He remained hemodynamically stable.  He did require  supplementation for mild hypokalemia with potassium of 3.3.  His other  labs showed mild hyponatremia with 126; however, he was asymptomatic,  chloride 92, CO2 25, blood glucose 117, BUN 15, creatinine 1.08, and  calcium 8.4.  White count of 14.5, which was felt probably reactive.  He  did have a negative urinalysis preoperatively.  His hemoglobin and  hematocrit were 13.2 and 39 respectively, and platelet count 164.  Left  neck appeared clean, dry, and intact without evidence of hematoma.  He  remained neurologically appropriate.  On postoperative day #1, he was  able to tolerate regular food, void without difficulty, pain was  controlled on oral medication, and ambulate in the hallways.  Subsequently, he was felt appropriate  for discharge home on  postoperative day 1, on February 25, 2008.  He was discharged in stable  condition.   DISCHARGE MEDICATIONS:  1. Oxycodone 5 mg 1-2 tablets p.o. q.4 h. p.r.n. pain.  2. Omeprazole 20 mg daily.  3. Chlorthalidone 25 mg HIM.  4. Allopurinol 30 mg two daily.  5. Colchicine 0.6 mg daily.  6. Coated aspirin 1 mg daily.  7. Potassium chloride 20 mEq daily.  8. Lipitor 10 mg daily.  9. Nabumetone 750 mg one b.i.d.  10.Metoprolol ER 115 mg one q.a.m.  11.Formulated vitamin daily.  12.Diovan 160 mg daily.  13.Testosterone injections 1 mL every 3 weeks.   DISCHARGE INSTRUCTIONS:  He is to continue heart-healthy diet, may  shower and clean incisions gently with soap and water, increase activity  slowly,  and avoid driving or heavy lifting for the next 2 weeks.  Call  us if he develops fever greater than 101 or redness or drainage from  incision sites or neurologic changes.  Otherwise, he will see Dr. Madilyn Fireman  at his [facility] office in 2-3 weeks.  Our office will contact him regarding  specific appointment date and time.      Jerold Coombe, P.A.      Balinda Quails, M.D.  Electronically Signed    AWZ/MEDQ  D:  02/25/2008  T:  02/26/2008  Job:  [id number]   cc:   Kingsley Callander. [name], MD  Claude Manges. Cleophas Dunker, M.D.  [name], MD, [facility]

## 2011-01-03 NOTE — Assessment & Plan Note (Signed)
[facility]                            CARDIOLOGY OFFICE NOTE   Daniel, [name]                      MRN:          [id number]  DATE:07/02/2008                            DOB:          [birth year]    [name] was seen back with a followup of his coronary artery disease.  Fortunately, he is doing very well.  I saw him last in November 2008.  Since that time through careful followup by Dr. Madilyn Fireman, he needed to have  a left carotid endarterectomy that was done successfully and he is doing  well.  He also had carpal tunnel repair done by Dr. Merlyn Lot, of his left  wrist.  He is stable.  He is not having any chest pain.  He goes about  full activities.  He has had no syncope or presyncope.   PAST MEDICAL HISTORY:   ALLERGIES:  No known drug allergies.   MEDICATIONS:  See the flow sheet.   REVIEW OF SYSTEMS:  He has no GI or GU symptoms.  He has no fevers or  chills or skin rashes at this time.  Review of systems is negative.   PHYSICAL EXAMINATION:  VITAL SIGNS:  Blood pressure is 147/82 with a  pulse of 54.  GENERAL:  The patient is oriented to person, time, and place.  Affect is  normal.  HEENT:  No xanthelasma.  He has normal extraocular motion.  NECK:  There are no carotid bruits.  There is no jugular venous  distention.  LUNGS:  Clear.  Respiratory effort is not labored.  CARDIAC:  S1 and S2.  No clicks or significant murmurs.  ABDOMEN:  Soft.  EXTREMITIES:  He has no peripheral edema.   EKG reveals sinus rhythm with nonspecific ST-T wave changes.  The  patient had a stress Myoview scan in November 2008 that showed no  significant ischemia.   Problems are listed completely on the note of June 25, 2007.  His  coronary artery disease is stable.  He needs no further testing at this  time.  His blood pressure meds are being adjusted by Dr. [name] and he is  doing well.  No further workup is needed.  I will see him in 1 year.     [name], MD, [facility]  Electronically Signed    JDK/MedQ  DD: 07/02/2008  DT: 07/03/2008  Job #: [id number]   cc:   Kingsley Callander. [name], MD

## 2011-01-03 NOTE — Procedures (Signed)
CAROTID DUPLEX EXAM   INDICATION:  Follow up known carotid disease.   HISTORY:  Diabetes:  No.  Cardiac:  Stent, CABG.  Hypertension:  Yes.  Smoking:  No.  Previous Surgery:  Left carotid endarterectomy with Dacron patch in  02/2008.  CV History:  Asymptomatic.  Amaurosis Fugax No, Paresthesias No, Hemiparesis No.                                       RIGHT             LEFT  Brachial systolic pressure:         124               120  Brachial Doppler waveforms:         Normal            Normal  Vertebral direction of flow:        Antegrade         Antegrade  DUPLEX VELOCITIES (cm/sec)  CCA peak systolic                   108               93  ECA peak systolic                   128               147  ICA peak systolic                   154               86  ICA end diastolic                   32                19  PLAQUE MORPHOLOGY:                  Calcific          Homogenous  PLAQUE AMOUNT:                      Moderate          Mild  PLAQUE LOCATION:                    ICA, bulb         ICA   IMPRESSION:  1. Doppler velocities suggest 40% to 59% stenosis in the right      internal carotid artery.  2. Left internal carotid artery is patent post carotid endarterectomy      with no evidence of restenosis or narrowing.  3. Antegrade flow in bilateral vertebral arteries.   ___________________________________________  V. Charlena Cross, MD   NT/MEDQ  D:  02/14/2010  T:  02/14/2010  Job:  [id number]

## 2011-01-03 NOTE — Assessment & Plan Note (Signed)
OFFICE VISIT   BLAYDEN, CONWELL  DOB:  1931-02-01                                       03/12/2008  NWGNF#:[id number]   The patient underwent left carotid endarterectomy 02/24/2008 at [facility]  Doing well without major complaints at this time.  Mild  soreness in his left side of his jaw.  Otherwise doing well.   BP 164/64, pulse 88 per minute.  Left neck incision healing  unremarkably.  No recurrent bruits.  Cranial nerves intact.  Strength  equal bilaterally.   The patient continues to do well following his left carotid surgery.  Will plan followup in 6 months with a carotid Doppler evaluation.   Balinda Quails, M.D.  Electronically Signed   PGH/MEDQ  D:  03/12/2008  T:  03/13/2008  Job:  [id number]   cc:   [name], MD, [facility]  Kingsley Callander. [name], MD

## 2011-01-03 NOTE — Assessment & Plan Note (Signed)
OFFICE VISIT   [name], [name]  DOB:  [birth year]                                       01/23/2008  ZOXWR#:[id number]   The patient underwent carotid surveillance Doppler evaluation on  01/09/2008 at this office.  He has progression of his left internal  carotid artery stenosis.  Velocities now measure 370/101 cm/sec, this  represents a stenosis greater than 80% by velocity evaluation.  His  right ICA shows some increase from previous study but remains in the 40-  59% range.   The patient has been free of any symptoms.  Denies sensory, motor or  visual deficit.  No speech problems.  No syncope or presyncope.   He is not a diabetic.  He has a history of coronary artery bypass.  Also  longstanding hypertension.  Not using tobacco.  No history of stroke.   PHYSICAL EXAMINATION:  On evaluation today he is a 77 year old gentleman  who is alert and oriented, in no acute distress.  His left neck does  reveal a bruit.  Cranial nerves intact.  Strength equal bilaterally.  Normal heart sounds without murmurs.  Chest is clear.  His blood  pressure is 150/79 in both arms and pulse is 54 per minute.   CURRENT MEDICATIONS:  Current medications reviewed again and he remains  on aspirin 81 mg daily.   PLAN:  The plan at this time is to obtain a CT angiogram of the neck and  followup appointment to see me.  He clearly does have progression of his  left internal carotid artery stenosis and may require surgery.   Balinda Quails, M.D.  Electronically Signed   PGH/MEDQ  D:  01/23/2008  T:  01/24/2008  Job:  [id number]   cc:   Kingsley Callander. [name], MD  [name], MD, [facility]

## 2011-01-03 NOTE — Procedures (Signed)
CAROTID DUPLEX EXAM   INDICATION:  Followup carotid artery disease.   HISTORY:  Diabetes:  No.  Cardiac:  CABG.  Hypertension:  Yes.  Smoking:  No.  Previous Surgery:  No.  CV History:  No.  Amaurosis Fugax No, Paresthesias No, Hemiparesis No                                       RIGHT               LEFT  Brachial systolic pressure:         190                 185  Brachial Doppler waveforms:         Biphasic            Biphasic  Vertebral direction of flow:        Antegrade           Antegrade  DUPLEX VELOCITIES (cm/sec)  CCA peak systolic                   99                  87  ECA peak systolic                   184                 190  ICA peak systolic                   184                 370  ICA end diastolic                   46                  101  PLAQUE MORPHOLOGY:                  Calcified           Calcified  PLAQUE AMOUNT:                      Moderate            Moderate/severe  PLAQUE LOCATION:                    ICA/ECA/bifurcation ICA/ECA/CCA   IMPRESSION:  1. Right ICA shows evidence of 40 to 59% stenosis, an increase from      previous study.  2. Left ICA shows evidence of 60 to 79% stenosis (top end of range),      an increase from previous study.  3. Bilateral ECA stenosis.  4. Followup appointment made to see Dr. Madilyn Fireman.   ___________________________________________  P. Liliane Bade, M.D.   AS/MEDQ  D:  01/09/2008  T:  01/09/2008  Job:  [id number]

## 2011-01-03 NOTE — Procedures (Signed)
CAROTID DUPLEX EXAM   INDICATION:  Followup carotid artery disease.   HISTORY:  Diabetes:  No.  Cardiac:  Stent, CABG.  Hypertension:  Yes.  Smoking:  No.  Previous Surgery:  Left carotid endarterectomy with Dacron patch in  02/2008.  CV History:  No.  Amaurosis Fugax No, Paresthesias No, Hemiparesis No                                       RIGHT             LEFT  Brachial systolic pressure:         130               120  Brachial Doppler waveforms:         WNL               WNL  Vertebral direction of flow:        Antegrade         Antegrade  DUPLEX VELOCITIES (cm/sec)  CCA peak systolic                   110               114  ECA peak systolic                   184               143  ICA peak systolic                   154               94  ICA end diastolic                   28                20  PLAQUE MORPHOLOGY:                  Calcific          Homogeneous  PLAQUE AMOUNT:                      Mild              Mild  PLAQUE LOCATION:                    Bulb              ICA   IMPRESSION:  1. 40%-59% stenosis of the right internal carotid artery.  2. 20%-39% stenosis of the left internal carotid artery status post      left carotid endarterectomy.  3. Antegrade flow in bilateral vertebrals.   ___________________________________________  V. Charlena Cross, MD   CB/MEDQ  D:  08/12/2009  T:  08/12/2009  Job:  [id number]

## 2011-01-03 NOTE — Assessment & Plan Note (Signed)
OFFICE VISIT   WOODY, KRONBERG  DOB:  May 05, 1931                                       02/14/2010  ZOXWR#:[id number]   The patient comes back in today for followup.  He is status post left  carotid endarterectomy on February 24, 2008 by Dr. Madilyn Fireman.  This was done for  progressive carotid stenosis which is asymptomatic.  The patient has  remained asymptomatic since that time.  He has had no complications from  his operation.   PHYSICAL EXAMINATION:  On physical exam, his heart rate is 65, blood  pressure 140/55, O2 saturations 98%.  He is well-appearing in no acute  distress.  HEENT:  Within normal limits.  Cardiovascular:  Regular rate  and rhythm, no carotid bruits.  Respirations are nonlabored.  Neurologically he is intact.   I have ordered and independently reviewed his carotid ultrasound that  shows 40% to 59% stenosis in the right side.  The left is widely patent  with no evidence of stenosis.   ASSESSMENT:  Status post left carotid endarterectomy.   PLAN:  The patient will be followed up in 1 year with a repeat carotid  ultrasound to evaluate the operative side as well as to follow the  nonoperative side.     Jorge Ny, MD  Electronically Signed   VWB/MEDQ  D:  02/14/2010  T:  02/15/2010  Job:  [id number]   cc:   [name], MD, [facility]  Kingsley Callander. [name], MD

## 2011-01-03 NOTE — Assessment & Plan Note (Signed)
[facility]                            CARDIOLOGY OFFICE NOTE   [name], [name]                      MRN:          [id number]  DATE:06/25/2007                            DOB:          12-25-1930    [name] is doing well. He has known coronary disease. He is not  having any chest pain. His last exercise test was done in December 2006  and it is time to repeat one. He is going about full activities. He has  gained a few extra pounds and he needs to lose some weight. Today his  blood pressure is a little higher than it has been. I am not changing  his medications but he will see Dr. [name] for early followup.   PAST MEDICAL HISTORY:   ALLERGIES:  No known drug allergies.   MEDICATIONS:  Lipitor, aspirin, allopurinol, colchicine, Altace, Toprol,  Synthroid, multivitamin, chlorthalidone, eyedrops, Depo-Testosterone  weekly and nabumetone 750 b.i.d.   OTHER MEDICAL PROBLEMS:  See the list below.   REVIEW OF SYSTEMS:  He is actually feeling well. He has no major  complaints. Review of systems is negative.   PHYSICAL EXAMINATION:  Blood pressure today is 160/85. His weight is 204  on our scale which is up 2 pounds since his last visit. Pulse is 69.  The patient is oriented to person, time and place. Affect is normal.  HEENT:  Reveals no xanthelasma. He has normal extraocular motion. There  is a question of a soft left carotid bruit. There is no jugular venous  distention.  LUNGS:  Clear. Respiratory effort is not labored.  CARDIAC:  Reveals an S1 with an S2. There is a 2/6 murmur.  ABDOMEN:  Soft. He has no masses or bruits. He has good distal pulses.  There is no significant peripheral edema.   EKG reveals normal sinus with no major changes in the past.   PROBLEM:  1. Coronary disease. It is now time for a followup Myoview scan and      this will be arranged.  2. Hypertension. This has been treated over time but his systolic is  higher than baseline today. He will follow his pressure and see Dr.      [name] soon.  3. Mild volume overload and this has been stable with him watching his      salt intake and using a diuretic as needed.  4. History of renal stones.  5. Benign prostatic hypertrophy.  6. Lipid abnormalities on very low-dose Lipitor.  7. History of syncope once in the past of unknown origin.  8. Carotid stenosis. He is followed by Dr. Liliane Bade and he is due to      see him some time in the near future.  9. Ejection fraction of 55%.  10.Episode of severe psoriasis in the past and ultimately this      improved. This was treated at [facility].   [name] is stable. We need to renew some of his medicine  prescriptions. He needs a stress Myoview scan. He will followup with Dr.  [name] concerning his blood  pressure.     Daniel Abed, MD, [facility]  Electronically Signed    JDK/MedQ  DD: 06/25/2007  DT: 06/26/2007  Job #: [id number]   cc:   Kingsley Callander. [name], MD  P. Liliane Bade, M.D.  Claude Manges. Cleophas Dunker, M.D.

## 2011-01-03 NOTE — Assessment & Plan Note (Signed)
OFFICE VISIT   [name], ZAHLER  DOB:  [birth year]                                       02/06/2008  ZOXWR#:[id number]   The patient returned to the office today with the results of CT  angiogram of the neck.  This does verify a severe stenosis of his left  internal carotid artery.  He will require a left carotid endarterectomy.   This is been scheduled for 02/24/2008 at [facility].   He had a stress test carried out by Dr. Myrtis Ser some time within the last  year and this was normal according to the patient.   I discussed the details of his operative procedure.  The potential risks  1-2% of a major complication.  He will increase his aspirin to 325 mg  daily.   His blood pressure is well controlled at 126/80 and his pulse is 69 per  minute.  He has had no symptoms up to this time.   Balinda Quails, M.D.  Electronically Signed   PGH/MEDQ  D:  02/06/2008  T:  02/07/2008  Job:  [id number]

## 2011-01-03 NOTE — Op Note (Signed)
NAMEINES, WARF               ACCOUNT NO.:  [id number]   MEDICAL RECORD NO.:  [id number]          PATIENT TYPE:  AMB   LOCATION:  DSC                          FACILITY:  [facility]   PHYSICIAN:  Cindee Salt, M.D.       DATE OF BIRTH:  1931-01-17   DATE OF PROCEDURE:  05/19/2008  DATE OF DISCHARGE:                               OPERATIVE REPORT   PREOPERATIVE DIAGNOSIS:  Carpal tunnel syndrome, cubital tunnel  syndrome, left arm.   POSTOPERATIVE DIAGNOSIS:  Carpal tunnel syndrome, cubital tunnel  syndrome, left arm.   OPERATION:  Decompression of left median nerve, left ulnar nerve at the  elbow and median nerve at the wrist.   SURGEON:  Cindee Salt, MD   ASSISTANT:  Joaquin Courts, RN   ANESTHESIA:  Axillary general.   ANESTHESIOLOGIST:  Quita Skye. Krista Blue, MD   HISTORY:  The patient is a 77 year old male with a history of carpal  tunnel syndrome.  EMG nerve conduction is positive with ulnar neuropathy  at his elbow.  He has elected to proceed to have this surgically  decompress.  Postoperative course has been discussed along with risks  and complications.  He is aware that there is no guarantee with the  surgery, possibility of infection, recurrence of injury to arteries,  nerves, tendons, incomplete relief of symptoms, and dystrophy.  In the  preoperative area, the patient is seen, the extremity marked by both the  patient and surgeon, and antibiotic given.   PROCEDURE IN DETAIL:  The patient was brought to the operating room  where an axillary block was carried out without difficulty.  He was  prepped using DuraPrep, supine position, left arm free.  He had some  feeling in the arm.  A general anesthetic was then carried out under the  direction of Dr. Krista Blue with an LMA.  Time-out was taken.  A  longitudinal incision was made in the palm after exsanguinating the limb  with an Esmarch bandage and inflation of tourniquet on the upper arm to  250 mmHg.  Dissection was carried down  splitting the palmar fascia.  Superficial palmar arch was identified.  Flexor tendon in the mid, ring,  and little finger was identified.  The ulnar side of median nerve with  the carpal retinaculum was incised with sharp dissection.  Right angle  and Sewall retractor were placed between the skin and forearm fascia.  The fascia released for approximately a centimeter and half proximal to  the wrist crease under direct vision.  The canal was explored.  Area  compression to the nerve was apparent.  No further lesions were  identified.  The wound was irrigated.  The skin was then closed with  interrupted 5-0 Vicryl Rapide sutures.  A separate incision was then  made over the medial side of the elbow with the epicondyle, carried down  through subcutaneous tissue, and bleeders again electrocauterized.  The  posterior branches of the medial antebrachial cutaneous nerve and  forearm were identified and protected.  The dissection carried down to  [facility] fascia.  This was opened.  The  dissection was carried proximally  and distally.  A fasciotomy was performed to the flexor carpi ulnaris  and Sewall retractors were placed.  This allowed visualization of the  intramuscular fascia.  This was released for approximately 5 cm distal  to the Osborne fascia in the entrance of the cubital tunnel into the  flexor carpi ulnaris protecting the motor branches.  Retractors were  then placed proximally to protect the posterior branches of medial  antebrachial cutaneous nerve of the forearm.  A release was then  performed of the fascia proximally to the level of the arcade of  Struthers.  The entire area was released.  The medial intermuscular  septum was left intact.  The elbow flexed and no subluxation to the  ulnar nerve was apparent.  The wound was irrigated.  Subcutaneous tissue  was closed with interrupted 4-0 Vicryl sutures and the skin with  interrupted 5-0 Vicryl Rapide sutures.  Sterile compressive  dressing and  splint with the elbow flexed approximately 20 degrees and the wrist in  extension was applied.  Fingers were free.  The patient tolerated the  procedure well.  On deflation of the tourniquet, all fingers immediately  pinked.  He was taken to the recovery room for observation in  satisfactory condition.  He will be discharged to home to return to the  [facility] [place] in 1 week on Vicodin.           ______________________________  Cindee Salt, M.D.     GK/MEDQ  D:  05/19/2008  T:  05/20/2008  Job:  [id number]   cc:   Kingsley Callander. [name], MD

## 2011-01-03 NOTE — Op Note (Signed)
NAMECHUKWUMA, STRAUS               ACCOUNT NO.:  [id number]   MEDICAL RECORD NO.:  [id number]          PATIENT TYPE:  INP   LOCATION:  3301                         FACILITY:  [facility]   PHYSICIAN:  Balinda Quails, M.D.    DATE OF BIRTH:  [birth year]   DATE OF PROCEDURE:  DATE OF DISCHARGE:                               OPERATIVE REPORT   SURGEON:  Balinda Quails, MD   ASSISTANT:  Crissie Reese, PA   ANESTHETIC:  General endotracheal.   PREOPERATIVE DIAGNOSIS:  Severe progressive left internal carotid artery  stenosis.   POSTOPERATIVE DIAGNOSIS:  Severe progressive left internal carotid  artery stenosis.   PROCEDURE:  Left carotid endarterectomy and Dacron patch angioplasty.   CLINICAL NOTE:  Mr. Bontempo is a 77 year old male followed in the office  with known carotid disease.  Recently, presented for routine follow up  with evidence of progression of his carotid disease.  He underwent  carotid duplex scan along with a CT angiogram verifying a severe left  internal carotid artery stenosis.  He was brought to the operating room  at this time for planned left carotid endarterectomy.   OPERATIVE PROCEDURE:  The patient was brought to the operating room in  stable hemodynamic condition.  Placed under general endotracheal  anesthesia.  Left neck prepped and draped in a sterile fashion.  Foley  catheter and arterial line in place.   Curvilinear skin incision made along the anterior border of left  sternomastoid muscle.  Dissection carried down through the subcutaneous  tissue with electrocautery.  Deep dissection carried through the  platysma.  The facial vein exposed and ligated with 2-0 silk and  divided.  Carotid bifurcation exposed.  The common carotid artery  mobilized down to the omohyoid muscle and encircled with a Vesseloop.  The superior thyroid and external carotid were freed and encircled with  Vesseloops.  The internal carotid artery then followed distally up to  the  posterior belly of the digastric muscle and encircled with a  Vesseloop.   The carotid bifurcation did reveal extensive calcified plaque disease in  the bulb extending into the left internal carotid artery.   The vagus nerve and hypoglossal nerve both clearly identified and  preserved.   The patient administered 7000 units of heparin intravenously.  Adequate  circulation time permitted.  The carotid vessel was controlled with  clamps.  A longitudinal arteriotomy made in the distal common carotid  artery.  The arteriotomy extended across the carotid bulb and up into  the internal carotid artery.   There was extensive calcified plaque with mild ulceration and a high-  grade left internal carotid artery stenosis.  A shunt was inserted.   The endarterectomy then carried out with an elevator, the plaque divided  proximally in the common carotid artery.  Raised up in the bulb was  superior thyroid and external carotid were endarterectomized using an  eversion technique.  The distal internal carotid artery plaque feathered  out well.   Fragments of plaque removed with fine forceps.  Site irrigated with  heparin, saline, and dextran solution.  A  patch angioplasty carried out  with a Finesse Dacron patch using running 6-0 Prolene suture.  At  completion of patch angioplasty, the shunt removed.  All vessels well  flushed.  Clamps removed directing initial antegrade flow up the  external carotid artery, following this the internal carotid was  released.   Excellent pulse and Doppler signal in the distal internal carotid  arteries.  The patient administered 50 mg protamine intravenously.  Adequate hemostasis obtained.  Sponge and instrument counts correct.   Sternomastoid fascia closed with running 2-0 Vicryl suture.  Platysma  closed with running 3-0 Vicryl suture.  Skin closed with 4-0 Monocryl.  Dermabond applied.  No apparent complications.  The patient awakened  readily.  Moved all  extremities to command.  Transferred to recovery  room in stable condition.      Balinda Quails, M.D.  Electronically Signed     PGH/MEDQ  D:  02/24/2008  T:  02/25/2008  Job:  [id number]   cc:   [name], MD, [facility]  Kingsley Callander. [name], MD

## 2011-01-06 NOTE — Op Note (Signed)
[name], [name]               ACCOUNT NO.:  [id number]   MEDICAL RECORD NO.:  [id number]          PATIENT TYPE:  INP   LOCATION:  2867                         FACILITY:  [facility]   PHYSICIAN:  Hilda Lias, M.D.   DATE OF BIRTH:  06/27/1931   DATE OF PROCEDURE:  10/18/2004  DATE OF DISCHARGE:                                 OPERATIVE REPORT   PREOPERATIVE DIAGNOSES:  L3-4 lumbar stenosis with a right L3-4 synovial  cyst and 2 mm spondylolisthesis at L3-4.   POSTOPERATIVE DIAGNOSES:  L3-4 lumbar stenosis with a right L3-4 synovial  cyst and 2 mm spondylolisthesis at L3-4.   PROCEDURES:  1.  Bilateral L3-4 laminectomies.  2.  Removal of synovial cyst.  3.  Posterior arthrodesis with autograft and allograft.   SURGEON:  Hilda Lias, M.D.   ASSISTANT:  Cristi Loron, M.D.   CLINICAL HISTORY:  The patient was admitted because of back pain with  radiation to both legs, right worse than the left one.  X-ray showed severe  stenosis at the level of L3-4 with a large synovial cyst.  The patient had a  2-3 mm step off between flexion and extension in that area.  Surgery was  advised.  The procedure was to decompress and fuse in situ.  The risks were  explained in the history and physical.   DESCRIPTION OF PROCEDURE:  The patient was taken to the OR and he was  positioned in a prone manner.  The patient had quite a difficult intubation.  The lumbar area was prepped with Betadine.  A midline incision from the  upper part of L3 down to L4-5 was made.  Muscle was retracted laterally.  X-  rays showed that we were at the spinous process of L2.  From then on, we  identified L3 and L4.  With a Stille rongeur, we removed the spinous process  of L3 and L4.  With the [employer] with a drill, we did bilateral  laminectomies.  Indeed at the level of L3-4 going to the right side there  was a large synovial cyst.  We brought the microscope into the area and  using dissection, as  well as the 1-2 mm Kerrison punch, we were able to  remove the cyst, decompressing along the line of the L3 and L4 nerve root.  Having good decompression, the area was irrigated.  Then we went laterally  and identified the transverse process of L3 and L4.  The periosteum was  removed.  Then a mix of autograft and allograft was used for the  arthrodesis.  Hemostasis was done with bipolar.  Fentanyl was left in the  epidural space and the wound was closed with Vicryl and Steri-Strips.  The  patient did well.  He is going to go to the recovery room.      EB/MEDQ  D:  10/18/2004  T:  10/18/2004  Job:  [id number]

## 2011-01-06 NOTE — Assessment & Plan Note (Signed)
[facility]                              CARDIOLOGY OFFICE NOTE   [name], [name]                      MRN:          [id number]  DATE:07/05/2006                            DOB:          1930-10-17    [name] returns today and is doing well from the cardiovascular view  point. He has not had any significant chest pain. When I saw him in December  of 2006, we decided to proceed with a stress Myoview scan. This was done on  August 04, 2005. He had no significant ischemia, and he is doing well. He  continues to have full activities. He is not having chest pain he has no  shortness of breath. He has no presyncope or syncope.   He does need to be cleared to have knee surgery by Dr. Norlene Campbell.   ALLERGIES:  No known drug allergies.   MEDICATIONS:  1. Lipitor 5.  2. Aspirin 81.  3. Allopurinol 300.  4. Colchicine.  5. Altace 10.  6. Toprol 100.  7. Synthroid 0.125.  8. Chlorthalidone 25.  9. Travatan.  10.Depo-Testosterone weekly.  11.Nabumetone b.i.d.   OTHER MEDICAL PROBLEMS:  See the complete list below.   REVIEW OF SYSTEMS:  The patient has noticed some psoriasis on his lower  abdomen. He had a severe problem with this in the past which stabilized. He  and I are both concerned that this appears to have returned. He is headed  over to [facility] to the team that worked with him previously to have thi  assessed. Otherwise, his review of systems is negative.   PHYSICAL EXAMINATION:  The patient is well developed and well nourished. He  is oriented x3. Affect is normal.  Eyes revealed no xanthelasma. He has normal extraocular motion. The  patient's tongue is normal. He is not having any significant dental  problems.  NECK:  Reveals his left carotid bruit that is known. He has no jugular  venous distention.  LUNGS:  Clear. Respiratory effort is not labored. He has no kyphosis or  scoliosis.  CARDIAC:  Reveals a S1 with a S2.  There is a 2/6 crescendo/decrescendo  systolic murmur.  ABDOMEN:  Soft. There are no masses or bruits. There is normal bowel sounds.  He does have areas of psoriatic skin lesions on his lower abdomen. He has no  peripheral edema. There are 2+ distal pulses.   EKG reveals no significant change.   PROBLEM:  1. Coronary disease. The patient's Myoview scan last year showed no marked      abnormalities. He does not need any further cardiac testing at this      time. He is post coronary artery bypass grafting in the past.  2. Hypertension. Treated.  3. History of mild volume overload. Watching his salt and keeping him on      low-dose diuretic, and he remains stable.  4. Hypertension. Treated.  5. History of renal stones.  6. Benign prostatic hypertrophy.  7. Lipids well treated on very low dose Lipitor.  8. History of syncope in the past of  unknown origin.  9. Carotid stenosis. I have encouraged him to have his yearly carotid      follow up before he has his knee surgery.  10.Episode of severe psoriasis in the past. He had a dramatically marked      course. He will be seen at [facility] very soon to help assess this.  11.History of ejection fraction 55%.   From the cardiac view point, he is stable for knee surgery. However, I have  recommended that he have his psoriasis fully evaluated again to be sure it  remains stable and that he have his follow up with Dr. Madilyn Fireman before he has  any knee surgery.     [name], MD, [facility]  Electronically Signed    JDK/MedQ  DD: 07/05/2006  DT: 07/05/2006  Job #: [id number]   cc:   Claude Manges. Cleophas Dunker, M.D.  Balinda Quails, M.D.  Kingsley Callander. [name], MD

## 2011-01-06 NOTE — H&P (Signed)
[facility]  Patient:    [name], VORIS Visit Number: [id number] MRN: [id number]          Service Type: SUR Location: [facility] 3172 02 Attending Physician:  Danella Penton Dictated by:   Tanya Nones. Jeral Fruit, M.D. Admit Date:  04/19/2001                           History and Physical  HISTORY OF PRESENT ILLNESS:  The patient is a 77 year old gentleman who came to see me because of weakening in the right arm.  This problem has been going for several months and he has no pain.  Because of the amount of atrophy he has in the arm he had an MRI and was sent to [place] for evaluation.  He denies any problem with the left upper extremity or sensory changes.  PAST MEDICAL HISTORY:  Open heart surgery in 1995.  He had his kidney stone in 1970.  ALLERGIES:  He is not allergic to any medication.  SOCIAL HISTORY:  Patient does not smoke or drink.  FAMILY HISTORY:  Unremarkable.  REVIEW OF SYSTEMS:  Positive for arm weakness and thyroid disease.  PHYSICAL EXAMINATION:  HEENT:  Normal.  NECK:  He has a good flexibility with no pain.  LUNGS:  Clear.  HEART:  Heart sounds normal.  ABDOMEN:  There is a midline scar from previous surgery.  Abdomen normal.  EXTREMITIES:  Normal pulses.  NEUROLOGIC:  Mental status and cranial nerves normal.  Strength in the left upper extremity is normal.  In the right upper extremity he has atrophy of the right deltoid, right biceps, and wrist extensor.  The weakness goes from 1/5 in the deltoid to 1/5 in the biceps to 2/5 at the level of the wrist.  Triceps completely normal.  The right lower extremity as well as the left is normal. Sensory normal.  Reflexes show absent on the right brachioradialis and right biceps.  There is 1+ of the right triceps.  Coordination normal.  LABORATORY DATA:  The MRI is quite impressive.  The patient has a severe case of spondylosis with stenosis at the level 4-5, 5-6.  CLINICAL  IMPRESSION:  C4-C5 and C5-C6 spondylosis with a chronic radiculopathy.  RECOMMENDATIONS:  The patient agrees with surgery.  He knows about the risks such as infection, CSF leak, worsening of the pain, stroke, all the risks associated with his open heart surgery including emboli to the brain, failure of the bone graft, damage to the vocal cord, and also the possibility that the amount of weakness that he has will not get better. Dictated by:   Tanya Nones. Jeral Fruit, M.D. Attending Physician:  Danella Penton DD:  04/19/01 TD:  04/19/01 Job: [id number]

## 2011-01-06 NOTE — Discharge Summary (Signed)
[name], [name]               ACCOUNT NO.:  [id number]   MEDICAL RECORD NO.:  [id number]          PATIENT TYPE:  INP   LOCATION:  5158                         FACILITY:  [facility]   PHYSICIAN:  Hilda Lias, M.D.   DATE OF BIRTH:  07-31-1931   DATE OF ADMISSION:  10/18/2004  DATE OF DISCHARGE:  10/21/2004                                 DISCHARGE SUMMARY   ADMISSION DIAGNOSIS:  L3-4 lumbar stenosis.   DISCHARGE DIAGNOSIS:  L3-4 lumbar stenosis.   HISTORY OF PRESENT ILLNESS:  The patient was admitted because of back pain  radiation down to both legs, right worse than the left one.  The patient has  a small step-off between L3 and 4 with a large synovial cyst.  Surgery was  advised.   LABORATORY DATA:  Normal.   HOSPITAL COURSE:  The patient was taken to surgery and bilateral L3-4  laminectomy and removal of synovial cyst was done.  We did a posterior  lateral arthrodesis.  Today, he is doing really well, he is ready to go  home, and he is going to be followed by me in my office in four weeks.   DISCHARGE MEDICATIONS:  1.  Flexeril.  2.  Percocet.   DIET:  Regular.   ACTIVITY:  Do not drive for a week.   FOLLOWUP:  In four weeks.      EB/MEDQ  D:  10/21/2004  T:  10/21/2004  Job:  [id number]

## 2011-02-02 ENCOUNTER — Encounter: Payer: Self-pay | Admitting: Cardiology

## 2011-02-13 ENCOUNTER — Other Ambulatory Visit (INDEPENDENT_AMBULATORY_CARE_PROVIDER_SITE_OTHER): Payer: Medicare Other

## 2011-02-13 ENCOUNTER — Ambulatory Visit: Payer: Self-pay | Admitting: Surgery

## 2011-02-13 ENCOUNTER — Ambulatory Visit (INDEPENDENT_AMBULATORY_CARE_PROVIDER_SITE_OTHER): Payer: Medicare Other | Admitting: Surgery

## 2011-02-13 ENCOUNTER — Other Ambulatory Visit: Payer: Self-pay

## 2011-02-13 DIAGNOSIS — I6529 Occlusion and stenosis of unspecified carotid artery: Secondary | ICD-10-CM

## 2011-02-13 DIAGNOSIS — Z48812 Encounter for surgical aftercare following surgery on the circulatory system: Secondary | ICD-10-CM

## 2011-02-14 NOTE — Assessment & Plan Note (Signed)
OFFICE VISIT  COURTLAND, REAS DOB:  05/28/1931                                       02/13/2011 GEXBM#:[id number]  REASON FOR VISIT:  Followup.  HISTORY:  This is a 77 year old gentleman who is status post left carotid endarterectomy on 02/24/2008 by Dr. Madilyn Fireman for asymptomatic progressive carotid stenosis.  He comes back in today for routine followup.  He denies having any symptoms.  Specifically denies numbness or weakness in either extremity.  No slurred speech.  No amaurosis fugax.  PHYSICAL EXAMINATION:  Vital signs:  Blood pressure is 172/77 on the right, 145/72 on the left, heart rate 74, respiratory rate 18.  General: He is well-appearing, in no distress.  Neurological:  He is intact. Cardiovascular:  Regular rate and rhythm.  No murmur.  No carotid bruits.  Respirations:  Are nonlabored.  No wheezes or rhonchi.  DIAGNOSTIC STUDIES:  Carotid duplex was performed today.  This shows 1%- 39% stenosis on the right and mild neointimal thickening on the left, unchanged from prior study in June of 2011.  ASSESSMENT:  Status post left carotid endarterectomy for asymptomatic disease.  PLAN:  The patient is doing well.  We will continue to follow him with repeat duplex ultrasound.  The next will be in 1 year.    Jorge Ny, MD Electronically Signed  VWB/MEDQ  D:  02/13/2011  T:  02/14/2011  Job:  [id number]  cc:   Kingsley Callander. [name], MD [name], MD, [facility]

## 2011-02-23 NOTE — Procedures (Unsigned)
CAROTID DUPLEX EXAM  INDICATION:  Followup carotid stenosis  HISTORY: Diabetes:  No. Cardiac:  Stent, CABG. Hypertension:  Yes. Smoking:  No. Previous Surgery:  Left carotid endarterectomy 02/24/2008. CV History:  Asymptomatic. Amaurosis Fugax No, Paresthesias No, Hemiparesis No                                      RIGHT             LEFT Brachial systolic pressure:         156               148 Brachial Doppler waveforms:         WNL               WNL Vertebral direction of flow:        Antegrade         Antegrade DUPLEX VELOCITIES (cm/sec) CCA peak systolic                   115               112 ECA peak systolic                   213               119 ICA peak systolic                   190               108 ICA end diastolic                   28                19 PLAQUE MORPHOLOGY:                  Calcified         Homogeneous PLAQUE AMOUNT:                      Mild to moderate  Mild PLAQUE LOCATION:                    CCA, ICA, ECA     CCA, ICA  IMPRESSION: 1. Right internal carotid artery stenosis in the 1%-39% range (high     end of range). 2. Right external carotid artery stenosis is present. 3. Left internal carotid artery myointimal thickening present with     history of endarterectomy. 4. Essentially unchanged since previous study on 02/14/2010.  ___________________________________________ V. Charlena Cross, MD  SH/MEDQ  D:  02/13/2011  T:  02/13/2011  Job:  [id number]

## 2011-03-23 ENCOUNTER — Encounter: Payer: Self-pay | Admitting: Cardiology

## 2011-03-23 DIAGNOSIS — E8770 Fluid overload, unspecified: Secondary | ICD-10-CM | POA: Insufficient documentation

## 2011-03-23 DIAGNOSIS — E785 Hyperlipidemia, unspecified: Secondary | ICD-10-CM | POA: Insufficient documentation

## 2011-03-23 DIAGNOSIS — I2510 Atherosclerotic heart disease of native coronary artery without angina pectoris: Secondary | ICD-10-CM | POA: Insufficient documentation

## 2011-03-23 DIAGNOSIS — G5600 Carpal tunnel syndrome, unspecified upper limb: Secondary | ICD-10-CM | POA: Insufficient documentation

## 2011-03-23 DIAGNOSIS — R55 Syncope and collapse: Secondary | ICD-10-CM | POA: Insufficient documentation

## 2011-03-23 DIAGNOSIS — L409 Psoriasis, unspecified: Secondary | ICD-10-CM | POA: Insufficient documentation

## 2011-03-23 DIAGNOSIS — R9430 Abnormal result of cardiovascular function study, unspecified: Secondary | ICD-10-CM | POA: Insufficient documentation

## 2011-03-23 DIAGNOSIS — Z951 Presence of aortocoronary bypass graft: Secondary | ICD-10-CM | POA: Insufficient documentation

## 2011-03-23 DIAGNOSIS — I10 Essential (primary) hypertension: Secondary | ICD-10-CM | POA: Insufficient documentation

## 2011-03-24 ENCOUNTER — Ambulatory Visit: Payer: Self-pay | Admitting: Cardiology

## 2011-03-24 ENCOUNTER — Encounter: Payer: Self-pay | Admitting: Cardiology

## 2011-03-24 ENCOUNTER — Ambulatory Visit (INDEPENDENT_AMBULATORY_CARE_PROVIDER_SITE_OTHER): Payer: Medicare Other | Admitting: Cardiology

## 2011-03-24 DIAGNOSIS — R011 Cardiac murmur, unspecified: Secondary | ICD-10-CM

## 2011-03-24 DIAGNOSIS — R55 Syncope and collapse: Secondary | ICD-10-CM

## 2011-03-24 DIAGNOSIS — I779 Disorder of arteries and arterioles, unspecified: Secondary | ICD-10-CM | POA: Insufficient documentation

## 2011-03-24 DIAGNOSIS — E785 Hyperlipidemia, unspecified: Secondary | ICD-10-CM

## 2011-03-24 DIAGNOSIS — I10 Essential (primary) hypertension: Secondary | ICD-10-CM

## 2011-03-24 DIAGNOSIS — I2510 Atherosclerotic heart disease of native coronary artery without angina pectoris: Secondary | ICD-10-CM

## 2011-03-24 DIAGNOSIS — I739 Peripheral vascular disease, unspecified: Secondary | ICD-10-CM

## 2011-03-24 NOTE — Assessment & Plan Note (Signed)
The patient's murmur suggesting aortic valvular disease.  I suspect he may now have some mild aortic stenosis.  Two-dimensional echo will be done to assess his LV function and his aortic valve.  We'll be in touch with him.  We can see him back in one year if further workup is not needed.

## 2011-03-24 NOTE — Assessment & Plan Note (Signed)
Coronary disease is stable.  He does not need further workup at this time. 

## 2011-03-24 NOTE — Assessment & Plan Note (Signed)
Blood pressure is under good control. No change in therapy 

## 2011-03-24 NOTE — Assessment & Plan Note (Signed)
Lipids are being treated. No change in therapy. 

## 2011-03-24 NOTE — Assessment & Plan Note (Signed)
Patient has had no recurrent syncope or presyncope.  No further workup. 

## 2011-03-24 NOTE — Assessment & Plan Note (Signed)
His vascular disease is being followed carefully by vascular surgery.  No further workup.

## 2011-03-24 NOTE — Patient Instructions (Signed)
Your physician recommends that you schedule a follow-up appointment in: 1 year  Your physician has requested that you have an echocardiogram. Echocardiography is a painless test that uses sound waves to create images of your heart. It provides your doctor with information about the size and shape of your heart and how well your heart's chambers and valves are working. This procedure takes approximately one hour. There are no restrictions for this procedure.    

## 2011-03-24 NOTE — Progress Notes (Signed)
HPI Patient is seen for cardiology followup.  [name] has known coronary disease.  [name] also has a systolic murmur.  In the past I had decided to wait for followup echo and will now be time to proceed with this.  [name] has carotid artery disease.  [name] had carotid endarterectomy in the past and is being followed now by Dr.Brabham.  [name] is fully active.  I saw him in the office last December, 2011 [name] received a drug-eluting stent to the SVG to marginal in October, 2010.  [name]'s been stable and does not need further evaluation of this at this time. Allergies  Allergen Reactions  . Glycolax (Polyethylene Glycol)     Current Outpatient Prescriptions  Medication Sig Dispense Refill  . acetaminophen (TYLENOL) 500 MG tablet Take 500 mg by mouth every 6 (six) hours as needed.        Marland Kitchen allopurinol (ZYLOPRIM) 300 MG tablet Take 600 mg by mouth daily.        Marland Kitchen aspirin 325 MG tablet Take 325 mg by mouth daily. ON HOLD       . atorvastatin (LIPITOR) 20 MG tablet Take 20 mg by mouth daily.        . chlorthalidone (HYGROTON) 25 MG tablet Take 25 mg by mouth daily.        . clopidogrel (PLAVIX) 75 MG tablet Take 75 mg by mouth daily. ON HOLD       . Glucosamine 500 MG CAPS Take 1 capsule by mouth daily.        Marland Kitchen levothyroxine (SYNTHROID, LEVOTHROID) 125 MCG tablet Take 125 mcg by mouth daily.        . metoprolol (TOPROL-XL) 100 MG 24 hr tablet Take 150 mg by mouth daily.        . multivitamin (THERAGRAN) per tablet Take 1 tablet by mouth daily.        . nitroGLYCERIN (NITROSTAT) 0.4 MG SL tablet Place 0.4 mg under the tongue every 5 (five) minutes as needed.        Marland Kitchen omeprazole (PRILOSEC) 20 MG capsule Take 20 mg by mouth daily.        Marland Kitchen pyridOXINE (VITAMIN B-6) 100 MG tablet Take 100 mg by mouth daily.        . Tamsulosin HCl (FLOMAX) 0.4 MG CAPS Take 0.4 mg by mouth daily.        Marland Kitchen testosterone cypionate (DEPOTESTOTERONE CYPIONATE) 200 MG/ML injection Inject into the muscle every 14 (fourteen) days.        . traMADol  (ULTRAM) 50 MG tablet Take 50 mg by mouth 3 (three) times daily as needed.        . Travoprost, BAK Free, (TRAVATAN Z) 0.004 % SOLN ophthalmic solution Place 1 drop into both eyes at bedtime.        . valsartan (DIOVAN) 320 MG tablet Take 320 mg by mouth daily.          History   Social History  . Marital Status: Married    Spouse Name: N/A    Number of Children: N/A  . Years of Education: N/A   Occupational History  . Not on file.   Social History Main Topics  . Smoking status: Never Smoker   . Smokeless tobacco: Not on file  . Alcohol Use: No  . Drug Use: Not on file  . Sexually Active: Not on file   Other Topics Concern  . Not on file   Social History Narrative  . No narrative  on file    No family history on file.  Past Medical History  Diagnosis Date  . Hyperlipidemia   . Benign prostatic hypertrophy   . HTN (hypertension)   . Carotid artery disease     Status post left carotid endarterectomy / Dr.   Myra Gianotti   . CAD (coronary artery disease)     DES, SVG to circumflex marginal, October, 2010 ( SVG to PDA and PLA patent , LIMA to LAD patent, EF 60%, mild inferior hypokinesis  . Syncope     Remote, etiology unknown  . Psoriasis     severe; with total skin exfoliation in the past resolved  . Systolic murmur   . Ejection fraction     EF 60% cardiac catheterization October, 2010  . Fluid overload     Mild,prn diuretic  . Nephrolithiasis   . Carpal tunnel syndrome   . Hx of decompressive lumbar laminectomy     Dr.Botero December, 2011  . Hx of CABG     Past Surgical History  Procedure Date  . Coronary artery bypass graft 1995  . Decompression of left median nerve   . Left carotid endarectomy and dacron patch angioplasty]   . Bilateral l3-4 laminectomies   . Removal of synovial cyst   . Posterior arthrodesis with autograft and allograft     ROS  Patient denies fever, chills, headache, sweats, rash, change in vision, change in hearing, chest pain,  cough, nausea vomiting, urinary symptoms.  All other systems are reviewed and are negative.  PHYSICAL EXAM Patient is quite stable today.  [name] is oriented to person time and place.  Affect is normal.  Head is atraumatic.  There is no xanthelasma.  There is a left carotid bruit.  There is no jugular venous distention.  Lungs are clear.  Respiratory effort is nonlabored.  Cardiac exam reveals S1-S2.  There is a 3/6 crescendo decrescendo systolic murmur compatible with aortic valve disease.  The abdomen is soft there is no peripheral edema.  There no skin rashes.  There is no musculoskeletal deformities. Filed Vitals:   03/24/11 0939  BP: 110/60  Pulse: 55  Height: 5\' 10"  (1.778 m)  Weight: 185 lb (83.915 kg)    EKG Is done today and reviewed by me.  There is normal sinus rhythm.  There are no acute changes.  ASSESSMENT & PLAN

## 2011-03-28 ENCOUNTER — Encounter: Payer: Self-pay | Admitting: Cardiology

## 2011-03-31 ENCOUNTER — Ambulatory Visit (HOSPITAL_COMMUNITY): Payer: Medicare Other | Attending: Cardiology

## 2011-04-05 ENCOUNTER — Encounter (INDEPENDENT_AMBULATORY_CARE_PROVIDER_SITE_OTHER): Payer: Self-pay | Admitting: *Deleted

## 2011-04-06 ENCOUNTER — Ambulatory Visit (HOSPITAL_COMMUNITY)
Admission: RE | Admit: 2011-04-06 | Discharge: 2011-04-06 | Disposition: A | Discharge status: Home / Self Care | Payer: Medicare Other | Source: Ambulatory Visit | Attending: Cardiology | Admitting: Cardiology

## 2011-04-06 DIAGNOSIS — R011 Cardiac murmur, unspecified: Secondary | ICD-10-CM | POA: Insufficient documentation

## 2011-04-06 DIAGNOSIS — E785 Hyperlipidemia, unspecified: Secondary | ICD-10-CM | POA: Insufficient documentation

## 2011-04-06 DIAGNOSIS — I10 Essential (primary) hypertension: Secondary | ICD-10-CM | POA: Insufficient documentation

## 2011-04-06 DIAGNOSIS — I359 Nonrheumatic aortic valve disorder, unspecified: Secondary | ICD-10-CM

## 2011-04-06 NOTE — Progress Notes (Signed)
*  PRELIMINARY RESULTS* Echocardiogram 2D Echocardiogram has been performed.  [name] 04/06/2011, 2:20 PM

## 2011-04-12 ENCOUNTER — Telehealth: Payer: Self-pay | Admitting: *Deleted

## 2011-04-12 NOTE — Telephone Encounter (Signed)
pt aware of echo results today and gave verbal understanding. [name]

## 2011-05-18 LAB — BASIC METABOLIC PANEL
CO2: 25
Calcium: 8.4
GFR calc Af Amer: >60
GFR calc non Af Amer: >60
Sodium: 126 — ABNORMAL LOW

## 2011-05-18 LAB — URINALYSIS, ROUTINE W REFLEX MICROSCOPIC
Hgb urine dipstick: NEGATIVE
Nitrite: NEGATIVE
Specific Gravity, Urine: 1.01
Urobilinogen, UA: 0.2

## 2011-05-18 LAB — COMPREHENSIVE METABOLIC PANEL
AST: 41 — ABNORMAL HIGH
Albumin: 4.2
Alkaline Phosphatase: 88
BUN: 18
GFR calc Af Amer: >60
Potassium: 4
Sodium: 136
Total Protein: 7.3

## 2011-05-18 LAB — TYPE AND SCREEN
ABO/RH(D): O POS
Antibody Screen: NEGATIVE

## 2011-05-18 LAB — CBC
HCT: 47.9
Hemoglobin: 13.2
Platelets: 214
RBC: 3.77 — ABNORMAL LOW
RDW: 14.8

## 2011-05-18 LAB — ABO/RH: ABO/RH(D): O POS

## 2011-05-18 LAB — PROTIME-INR: INR: 0.9

## 2011-05-22 LAB — BASIC METABOLIC PANEL
CO2: 28
Chloride: 101
Glucose, Bld: 95
Potassium: 4.5
Sodium: 137

## 2011-09-14 ENCOUNTER — Encounter (INDEPENDENT_AMBULATORY_CARE_PROVIDER_SITE_OTHER): Payer: Self-pay | Admitting: *Deleted

## 2011-09-14 ENCOUNTER — Other Ambulatory Visit (INDEPENDENT_AMBULATORY_CARE_PROVIDER_SITE_OTHER): Payer: Self-pay | Admitting: *Deleted

## 2011-09-14 DIAGNOSIS — Z1211 Encounter for screening for malignant neoplasm of colon: Secondary | ICD-10-CM

## 2011-10-26 ENCOUNTER — Telehealth (INDEPENDENT_AMBULATORY_CARE_PROVIDER_SITE_OTHER): Payer: Self-pay | Admitting: *Deleted

## 2011-10-26 NOTE — Telephone Encounter (Signed)
Requesting MD/PCP:  [name]     Name & DOB: Edgerrin Correia 02-Mar-2031     Procedure: TCS  Reason/Indication:  screening  Has patient had this procedure before?  yes  If so, when, by whom and where?  2002  Is there a family history of colon cancer?  no  Who?  What age when diagnosed?    Is patient diabetic?   no      Does patient have prosthetic heart valve?  no  Do you have a pacemaker?  no  Has patient had joint replacement within last 12 months?  no  Is patient on Coumadin, Plavix and/or Aspirin? yes  Medications: plavix 75 mg daily, asa 81 mg daily, Lipitor 20 mg daily, allopurinol 300 mg bid, synthroid 125 mcg daily, multi vit, chlorthalidone 25 mg daily, eye drop 1 drop each eye daily, toprol xl 150 mg daily, diovan 320 mg daily, depotestosterone 1 ml every 2 weeks, glucosamine, nitroglycerin prn, omeprazole 20 mg daily  Allergies: glycolax  Pharmacy: [facility]  Medication Adjustment: plavix 5 days, asa 2 days  Procedure date & time: 11/23/11 at 730

## 2011-10-27 NOTE — Telephone Encounter (Signed)
agree

## 2011-11-17 ENCOUNTER — Encounter (HOSPITAL_COMMUNITY): Payer: Self-pay

## 2011-11-23 ENCOUNTER — Encounter (HOSPITAL_COMMUNITY): Payer: Self-pay | Admitting: *Deleted

## 2011-11-23 ENCOUNTER — Ambulatory Visit (HOSPITAL_COMMUNITY)
Admission: RE | Admit: 2011-11-23 | Discharge: 2011-11-23 | Disposition: A | Discharge status: Home Health | Payer: Medicare Other | Source: Ambulatory Visit | Attending: Internal Medicine | Admitting: Internal Medicine

## 2011-11-23 ENCOUNTER — Encounter (HOSPITAL_COMMUNITY)
Admission: RE | Disposition: A | Discharge status: Home Health | Payer: Self-pay | Source: Ambulatory Visit | Attending: Internal Medicine

## 2011-11-23 DIAGNOSIS — Z1211 Encounter for screening for malignant neoplasm of colon: Secondary | ICD-10-CM

## 2011-11-23 DIAGNOSIS — Z79899 Other long term (current) drug therapy: Secondary | ICD-10-CM | POA: Insufficient documentation

## 2011-11-23 DIAGNOSIS — E785 Hyperlipidemia, unspecified: Secondary | ICD-10-CM | POA: Insufficient documentation

## 2011-11-23 DIAGNOSIS — D126 Benign neoplasm of colon, unspecified: Secondary | ICD-10-CM

## 2011-11-23 DIAGNOSIS — Z951 Presence of aortocoronary bypass graft: Secondary | ICD-10-CM | POA: Insufficient documentation

## 2011-11-23 DIAGNOSIS — K644 Residual hemorrhoidal skin tags: Secondary | ICD-10-CM | POA: Insufficient documentation

## 2011-11-23 DIAGNOSIS — I10 Essential (primary) hypertension: Secondary | ICD-10-CM | POA: Insufficient documentation

## 2011-11-23 HISTORY — PX: COLONOSCOPY: SHX5424

## 2011-11-23 SURGERY — COLONOSCOPY
Anesthesia: Moderate Sedation

## 2011-11-23 MED ORDER — MIDAZOLAM HCL 5 MG/5ML IJ SOLN
INTRAMUSCULAR | Status: AC
  Filled 2011-11-23: qty 10

## 2011-11-23 MED ORDER — STERILE WATER FOR IRRIGATION IR SOLN
Status: DC | PRN
  Administered 2011-11-23: 07:00:00

## 2011-11-23 MED ORDER — MEPERIDINE HCL 50 MG/ML IJ SOLN
INTRAMUSCULAR | Status: AC
  Filled 2011-11-23: qty 1

## 2011-11-23 MED ORDER — MEPERIDINE HCL 50 MG/ML IJ SOLN
INTRAMUSCULAR | Status: DC | PRN
  Administered 2011-11-23: 25 mg via INTRAVENOUS

## 2011-11-23 MED ORDER — SODIUM CHLORIDE 0.45 % IV SOLN
Freq: Once | INTRAVENOUS | Status: AC
  Administered 2011-11-23: 07:00:00 via INTRAVENOUS

## 2011-11-23 MED ORDER — MIDAZOLAM HCL 5 MG/5ML IJ SOLN
INTRAMUSCULAR | Status: DC | PRN
  Administered 2011-11-23: 1 mg via INTRAVENOUS
  Administered 2011-11-23: 2 mg via INTRAVENOUS
  Administered 2011-11-23: 1 mg via INTRAVENOUS

## 2011-11-23 NOTE — H&P (Signed)
[name] is an 78 y.o. male.   Chief Complaint: Patient is here for colonoscopy. HPI: Patient is a 78 year-old Caucasian male who is here for average risk screening colonoscopy. He denies abdominal pain change in his bowel habits or rectal bleeding. Patient's loss exams in 2002. Family history is negative for colorectal carcinoma.   Past Medical History  Diagnosis Date  . Hyperlipidemia   . Benign prostatic hypertrophy   . HTN (hypertension)   . Carotid artery disease     Status post left carotid endarterectomy / Dr.   Myra Gianotti   . CAD (coronary artery disease)     DES, SVG to circumflex marginal, October, 2010 ( SVG to PDA and PLA patent , LIMA to LAD patent, EF 60%, mild inferior hypokinesis  . Syncope     Remote, etiology unknown  . Psoriasis     severe; with total skin exfoliation in the past resolved  . Systolic murmur   . Ejection fraction     EF 60% cardiac catheterization October, 2010  . Fluid overload     Mild,prn diuretic  . Nephrolithiasis   . Carpal tunnel syndrome   . Hx of decompressive lumbar laminectomy     Dr.Botero December, 2011  . Hx of CABG     Past Surgical History  Procedure Date  . Coronary artery bypass graft 1995  . Decompression of left median nerve   . Left carotid endarectomy and dacron patch angioplasty]   . Bilateral l3-4 laminectomies   . Removal of synovial cyst   . Posterior arthrodesis with autograft and allograft     Family History  Problem Relation Age of Onset  . Colon cancer Neg Hx    Social History:  reports that he has never smoked. He does not have any smokeless tobacco history on file. He reports that he drinks alcohol. He reports that he does not use illicit drugs.  Allergies:  Allergies  Allergen Reactions  . Propylene Glycol Rash    Medications Prior to Admission  Medication Dose Route Frequency Provider Last Rate Last Dose  . 0.45 % sodium chloride infusion   Intravenous Once Malissa Hippo, MD 20 mL/hr at  11/23/11 4098    . meperidine (DEMEROL) 50 MG/ML injection           . midazolam (VERSED) 5 MG/5ML injection           . simethicone susp in sterile water 1000 mL irrigation    PRN Malissa Hippo, MD       Medications Prior to Admission  Medication Sig Dispense Refill  . acetaminophen (TYLENOL) 500 MG tablet Take 500 mg by mouth every 6 (six) hours as needed. For pain      . allopurinol (ZYLOPRIM) 300 MG tablet Take 600 mg by mouth daily.       Marland Kitchen atorvastatin (LIPITOR) 20 MG tablet Take 20 mg by mouth daily.        . chlorthalidone (HYGROTON) 25 MG tablet Take 25 mg by mouth daily.        . clopidogrel (PLAVIX) 75 MG tablet Take 75 mg by mouth daily. ON HOLD for 5 days prior to surgery      . Glucosamine 500 MG CAPS Take 1 capsule by mouth daily.       Marland Kitchen levothyroxine (SYNTHROID, LEVOTHROID) 125 MCG tablet Take 125 mcg by mouth daily.       . metoprolol (TOPROL-XL) 100 MG 24 hr tablet Take 150 mg by  mouth daily.       . multivitamin (THERAGRAN) per tablet Take 1 tablet by mouth daily.        Marland Kitchen omeprazole (PRILOSEC) 20 MG capsule Take 20 mg by mouth daily.        Marland Kitchen pyridOXINE (VITAMIN B-6) 100 MG tablet Take 100 mg by mouth daily.       . Tamsulosin HCl (FLOMAX) 0.4 MG CAPS Take 0.4 mg by mouth daily.       Marland Kitchen testosterone cypionate (DEPOTESTOTERONE CYPIONATE) 200 MG/ML injection Inject into the muscle every 14 (fourteen) days.        . traMADol (ULTRAM) 50 MG tablet Take 50 mg by mouth daily.       . Travoprost, BAK Free, (TRAVATAN Z) 0.004 % SOLN ophthalmic solution Place 1 drop into both eyes at bedtime.       . valsartan (DIOVAN) 320 MG tablet Take 320 mg by mouth daily.       . nitroGLYCERIN (NITROSTAT) 0.4 MG SL tablet Place 0.4 mg under the tongue every 5 (five) minutes x 3 doses as needed.         No results found for this or any previous visit (from the past 48 hour(s)). No results found.  Review of Systems  Constitutional: Negative for weight loss.  Gastrointestinal: Negative  for abdominal pain, diarrhea, constipation, blood in stool and melena.    Blood pressure 170/87, temperature 97.8 F (36.6 C), temperature source Oral, resp. rate 21, height 5\' 10"  (1.778 m), weight 185 lb (83.915 kg), SpO2 96.00%. Physical Exam  Constitutional: He appears well-developed and well-nourished.  HENT:  Mouth/Throat: Oropharynx is clear and moist.  Eyes: Conjunctivae are normal.  Neck: No thyromegaly present.  Cardiovascular: Normal rate and regular rhythm.   No murmur heard.      Grade 3/6 SEM at LLSB  Respiratory: Effort normal and breath sounds normal.  GI: Soft. He exhibits no distension and no mass. There is no tenderness.  Musculoskeletal: He exhibits edema.  Lymphadenopathy:    He has no cervical adenopathy.  Neurological: He is alert.  Skin: Skin is warm and dry.   he has leg edema bilaterally slightly worse on the left; no calf tenderness noted.  Assessment/Plan Average risk screening colonoscopy  Dayyan Krist U 11/23/2011, 7:34 AM

## 2011-11-23 NOTE — Discharge Instructions (Signed)
Resume medications and diet. No driving for 24 hours. Physician will contact you with biopsy results.  Colonoscopy Care After Read the instructions outlined below and refer to this sheet in the next few weeks. These discharge instructions provide you with general information on caring for yourself after you leave the hospital. Your doctor may also give you specific instructions. While your treatment has been planned according to the most current medical practices available, unavoidable complications occasionally occur. If you have any problems or questions after discharge, call your doctor. HOME CARE INSTRUCTIONS ACTIVITY:  You may resume your regular activity, but move at a slower pace for the next 24 hours.   Take frequent rest periods for the next 24 hours.   Walking will help get rid of the air and reduce the bloated feeling in your belly (abdomen).   No driving for 24 hours (because of the medicine (anesthesia) used during the test).   You may shower.   Do not sign any important legal documents or operate any machinery for 24 hours (because of the anesthesia used during the test).  NUTRITION:  Drink plenty of fluids.   You may resume your normal diet as instructed by your doctor.   Begin with a light meal and progress to your normal diet. Heavy or fried foods are harder to digest and may make you feel sick to your stomach (nauseated).   Avoid alcoholic beverages for 24 hours or as instructed.  MEDICATIONS:  You may resume your normal medications unless your doctor tells you otherwise.  WHAT TO EXPECT TODAY:  Some feelings of bloating in the abdomen.   Passage of more gas than usual.   Spotting of blood in your stool or on the toilet paper.  IF YOU HAD POLYPS REMOVED DURING THE COLONOSCOPY:  No aspirin products for 7 days or as instructed.   No alcohol for 7 days or as instructed.   Eat a soft diet for the next 24 hours.  FINDING OUT THE RESULTS OF YOUR TEST Not  all test results are available during your visit. If your test results are not back during the visit, make an appointment with your caregiver to find out the results. Do not assume everything is normal if you have not heard from your caregiver or the medical facility. It is important for you to follow up on all of your test results.  SEEK IMMEDIATE MEDICAL CARE IF:  You have more than a spotting of blood in your stool.   Your belly is swollen (abdominal distention).   You are nauseated or vomiting.   You have a fever.   You have abdominal pain or discomfort that is severe or gets worse throughout the day.  Document Released: 03/21/2004 Document Revised: 07/27/2011 Document Reviewed: 03/19/2008 [facility] Patient Information 2012 [place], [place].  Colon Polyps A polyp is extra tissue that grows inside your body. Colon polyps grow in the large intestine. The large intestine, also called the colon, is part of your digestive system. It is a long, hollow tube at the end of your digestive tract where your body makes and stores stool. Most polyps are not dangerous. They are benign. This means they are not cancerous. But over time, some types of polyps can turn into cancer. Polyps that are smaller than a pea are usually not harmful. But larger polyps could someday become or may already be cancerous. To be safe, doctors remove all polyps and test them.  WHO GETS POLYPS? Anyone can get polyps, but  certain people are more likely than others. You may have a greater chance of getting polyps if:  You are over 50.   You have had polyps before.   Someone in your family has had polyps.   Someone in your family has had cancer of the large intestine.   Find out if someone in your family has had polyps. You may also be more likely to get polyps if you:   Eat a lot of fatty foods.   Smoke.   Drink alcohol.   Do not exercise.   Eat too much.  SYMPTOMS  Most small polyps do not cause symptoms. People  often do not know they have one until their caregiver finds it during a regular checkup or while testing them for something else. Some people do have symptoms like these:  Bleeding from the anus. You might notice blood on your underwear or on toilet paper after you have had a bowel movement.   Constipation or diarrhea that lasts more than a week.   Blood in the stool. Blood can make stool look black or it can show up as red streaks in the stool.  If you have any of these symptoms, see your caregiver. HOW DOES THE DOCTOR TEST FOR POLYPS? The doctor can use four tests to check for polyps:  Digital rectal exam. The caregiver wears gloves and checks your rectum (the last part of the large intestine) to see if it feels normal. This test would find polyps only in the rectum. Your caregiver may need to do one of the other tests listed below to find polyps higher up in the intestine.   Barium enema. The caregiver puts a liquid called barium into your rectum before taking x-rays of your large intestine. Barium makes your intestine look white in the pictures. Polyps are dark, so they are easy to see.   Sigmoidoscopy. With this test, the caregiver can see inside your large intestine. A thin flexible tube is placed into your rectum. The device is called a sigmoidoscope, which has a light and a tiny video camera in it. The caregiver uses the sigmoidoscope to look at the last third of your large intestine.   Colonoscopy. This test is like sigmoidoscopy, but the caregiver looks at all of the large intestine. It usually requires sedation. This is the most common method for finding and removing polyps.  TREATMENT   The caregiver will remove the polyp during sigmoidoscopy or colonoscopy. The polyp is then tested for cancer.   If you have had polyps, your caregiver may want you to get tested regularly in the future.  PREVENTION  There is not one sure way to prevent polyps. You might be able to lower your risk  of getting them if you:  Eat more fruits and vegetables and less fatty food.   Do not smoke.   Avoid alcohol.   Exercise every day.   Lose weight if you are overweight.   Eating more calcium and folate can also lower your risk of getting polyps. Some foods that are rich in calcium are milk, cheese, and broccoli. Some foods that are rich in folate are chickpeas, kidney beans, and spinach.   Aspirin might help prevent polyps. Studies are under way.  Document Released: 05/03/2004 Document Revised: 07/27/2011 Document Reviewed: 10/09/2007 [facility] Patient Information 2012 [place], [place].

## 2011-11-23 NOTE — Op Note (Signed)
COLONOSCOPY PROCEDURE REPORT  PATIENT:  [name]  MR#:  [id number] Birthdate:  1931-03-19, 78 y.o., male Endoscopist:  Dr. Malissa Hippo, MD Referred By:  Dr. Carylon Perches, MD Procedure Date: 11/23/2011  Procedure:   Colonoscopy  Indications:  Patient is a 78 year old Caucasian male who is undergoing average risk screening colonoscopy  Informed Consent:  The procedure and risks were reviewed with the patient and informed consent was obtained.  Medications:  Demerol 25 mg IV Versed 4 mg IV  Description of procedure:  After a digital rectal exam was performed, that colonoscope was advanced from the anus through the rectum and colon to the area of the cecum, ileocecal valve and appendiceal orifice. The cecum was deeply intubated. These structures were well-seen and photographed for the record. From the level of the cecum and ileocecal valve, the scope was slowly and cautiously withdrawn. The mucosal surfaces were carefully surveyed utilizing scope tip to flexion to facilitate fold flattening as needed. The scope was pulled down into the rectum where a thorough exam including retroflexion was performed.  Findings:   Prep satisfactory. Small polyp ablated via cold biopsy from cecum and another from hepatic flexure and submitted together. Rest of the mucosa was normal. Normal rectal mucosa. Small hemorrhoids below the dentate line.  Therapeutic/Diagnostic Maneuvers Performed:  See above  Complications:  None  Cecal Withdrawal Time:  10 minutes  Impression:  Examination performed to cecum. Two small polyps ablated via cold biopsy and submitted together. One was at cecum and the second one at hepatic flexure. Small external hemorrhoids.   Recommendations:  Standard instructions given. I will be contacting patient with biopsy results.  Marquese Burkland U  11/23/2011 8:21 AM  CC: Dr. Carylon Perches, MD, MD & Dr. Bonnetta Barry ref. provider found

## 2011-11-27 ENCOUNTER — Encounter (HOSPITAL_COMMUNITY): Payer: Self-pay | Admitting: Internal Medicine

## 2011-12-01 ENCOUNTER — Encounter (INDEPENDENT_AMBULATORY_CARE_PROVIDER_SITE_OTHER): Payer: Self-pay | Admitting: *Deleted

## 2012-02-12 ENCOUNTER — Encounter: Payer: Self-pay | Admitting: Neurosurgery

## 2012-02-13 ENCOUNTER — Other Ambulatory Visit: Payer: Federal, State, Local not specified - PPO

## 2012-02-13 ENCOUNTER — Ambulatory Visit (INDEPENDENT_AMBULATORY_CARE_PROVIDER_SITE_OTHER): Payer: Medicare Other | Admitting: *Deleted

## 2012-02-13 ENCOUNTER — Encounter: Payer: Self-pay | Admitting: Neurosurgery

## 2012-02-13 ENCOUNTER — Ambulatory Visit (INDEPENDENT_AMBULATORY_CARE_PROVIDER_SITE_OTHER): Payer: Medicare Other | Admitting: Neurosurgery

## 2012-02-13 VITALS — BP 130/68 | HR 63 | Resp 14 | Ht 70.0 in | Wt 185.3 lb

## 2012-02-13 DIAGNOSIS — I6529 Occlusion and stenosis of unspecified carotid artery: Secondary | ICD-10-CM

## 2012-02-13 DIAGNOSIS — Z48812 Encounter for surgical aftercare following surgery on the circulatory system: Secondary | ICD-10-CM

## 2012-02-13 NOTE — Progress Notes (Signed)
[facility]  Carotid Office Note  CC: Annual carotid duplex Referring Physician: Brabham  History of Present Illness: 78 year old male patient of Dr. Myra Gianotti who is status post a left CEA in 2009 with Dr. Madilyn Fireman. Patient reports no signs or symptoms of CVA, TIA, amaurosis fugax or any neural deficit. The patient also denies any new medical diagnoses or recent surgeries.  Past Medical History  Diagnosis Date  . Hyperlipidemia   . Benign prostatic hypertrophy   . HTN (hypertension)   . Carotid artery disease     Status post left carotid endarterectomy / Dr.   Myra Gianotti   . CAD (coronary artery disease)     DES, SVG to circumflex marginal, October, 2010 ( SVG to PDA and PLA patent , LIMA to LAD patent, EF 60%, mild inferior hypokinesis  . Syncope     Remote, etiology unknown  . Psoriasis     severe; with total skin exfoliation in the past resolved  . Systolic murmur   . Ejection fraction     EF 60% cardiac catheterization October, 2010  . Fluid overload     Mild,prn diuretic  . Nephrolithiasis   . Carpal tunnel syndrome   . Hx of decompressive lumbar laminectomy     Dr.Botero December, 2011  . Hx of CABG     ROS: [x]  Positive   [ ]  Denies    General: [ ]  Weight loss, [ ]  Fever, [ ]  chills Neurologic: [ ]  Dizziness, [ ]  Blackouts, [ ]  Seizure [ ]  Stroke, [ ]  "Mini stroke", [ ]  Slurred speech, [ ]  Temporary blindness; [ ]  weakness in arms or legs, [ ]  Hoarseness Cardiac: [ ]  Chest pain/pressure, [ ]  Shortness of breath at rest [ ]  Shortness of breath with exertion, [ ]  Atrial fibrillation or irregular heartbeat Vascular: [ ]  Pain in legs with walking, [ ]  Pain in legs at rest, [ ]  Pain in legs at night,  [ ]  Non-healing ulcer, [ ]  Blood clot in vein/DVT,   Pulmonary: [ ]  Home oxygen, [ ]  Productive cough, [ ]  Coughing up blood, [ ]  Asthma,  [ ]  Wheezing Musculoskeletal:  [ ]  Arthritis, [ ]  Low back pain, [ ]  Joint pain Hematologic: [ ]  Easy Bruising, [  ] Anemia; [ ]  Hepatitis Gastrointestinal: [ ]  Blood in stool, [ ]  Gastroesophageal Reflux/heartburn, [ ]  Trouble swallowing Urinary: [ ]  chronic Kidney disease, [ ]  on HD - [ ]  MWF or [ ]  TTHS, [ ]  Burning with urination, [ ]  Difficulty urinating Skin: [ ]  Rashes, [ ]  Wounds Psychological: [ ]  Anxiety, [ ]  Depression   Social History History  Substance Use Topics  . Smoking status: Never Smoker   . Smokeless tobacco: Not on file  . Alcohol Use: Yes     Occasional    Family History Family History  Problem Relation Age of Onset  . Colon cancer Neg Hx     Allergies  Allergen Reactions  . Propylene Glycol Rash    Current Outpatient Prescriptions  Medication Sig Dispense Refill  . acetaminophen (TYLENOL) 500 MG tablet Take 500 mg by mouth every 6 (six) hours as needed. For pain      . allopurinol (ZYLOPRIM) 300 MG tablet Take 600 mg by mouth daily.       Marland Kitchen aspirin EC 81 MG tablet Take 81 mg by mouth daily. Hold for 2 days prior to surgery      . atorvastatin (LIPITOR) 20 MG tablet Take  20 mg by mouth daily.        . chlorthalidone (HYGROTON) 25 MG tablet Take 25 mg by mouth daily.        . clopidogrel (PLAVIX) 75 MG tablet Take 75 mg by mouth daily. ON HOLD for 5 days prior to surgery      . Glucosamine 500 MG CAPS Take 1 capsule by mouth daily.       Marland Kitchen ibuprofen (ADVIL,MOTRIN) 200 MG tablet Take 200 mg by mouth every 6 (six) hours as needed. For pain      . levothyroxine (SYNTHROID, LEVOTHROID) 125 MCG tablet Take 125 mcg by mouth daily.       . metoprolol (TOPROL-XL) 100 MG 24 hr tablet Take 150 mg by mouth daily.       . multivitamin (THERAGRAN) per tablet Take 1 tablet by mouth daily.        . nitroGLYCERIN (NITROSTAT) 0.4 MG SL tablet Place 0.4 mg under the tongue every 5 (five) minutes x 3 doses as needed.       Marland Kitchen omeprazole (PRILOSEC) 20 MG capsule Take 20 mg by mouth daily.        Marland Kitchen pyridOXINE (VITAMIN B-6) 100 MG tablet Take 100 mg by mouth daily.       . Tamsulosin HCl  (FLOMAX) 0.4 MG CAPS Take 0.4 mg by mouth daily.       Marland Kitchen testosterone cypionate (DEPOTESTOTERONE CYPIONATE) 200 MG/ML injection Inject 70 mg into the muscle every 14 (fourteen) days.       . traMADol (ULTRAM) 50 MG tablet Take 50 mg by mouth daily.       . valsartan (DIOVAN) 320 MG tablet Take 320 mg by mouth daily.         Physical Examination  Filed Vitals:   02/13/12 1340  BP: 130/68  Pulse:   Resp:     Body mass index is 26.59 kg/(m^2).  General:  WDWN in NAD Gait: Normal HEENT: WNL Eyes: Pupils equal Pulmonary: normal non-labored breathing , without Rales, rhonchi,  wheezing Cardiac: RRR, without  Murmurs, rubs or gallops; Abdomen: soft, NT, no masses Skin: no rashes, ulcers noted  Vascular Exam Pulses: 3+ radial pulses bilaterally Carotid bruits: Carotid pulses to auscultation no bruits are heard Extremities without ischemic changes, no Gangrene , no cellulitis; no open wounds;  Musculoskeletal: no muscle wasting or atrophy   Neurologic: A&O X 3; Appropriate Affect ; SENSATION: normal; MOTOR FUNCTION:  moving all extremities equally. Speech is fluent/normal  Non-Invasive Vascular Imaging CAROTID DUPLEX 02/13/2012  Right ICA 20 - 39 % stenosis Left ICA 0 - 19% stenosis   ASSESSMENT/PLAN: Asymptomatic patient status post left CEA 2009. The patient return in one year for repeat carotid duplex and be seen in my clinic. The patient's questions were encouraged and answered. The patient understands signs and symptoms of CVA and knows to report to the nearest emergency room should that occur.  Lauree Chandler ANP   Clinic MD: [facility]

## 2012-02-14 NOTE — Addendum Note (Signed)
Addended by: Sharee Pimple on: 02/14/2012 09:44 AM   Modules accepted: Orders

## 2012-02-19 NOTE — Procedures (Unsigned)
CAROTID DUPLEX EXAM  INDICATION:  Carotid disease  HISTORY: Diabetes:  No Cardiac:  Stent, CABG Hypertension:  Yes Smoking:  No Previous Surgery:  Left CEA 02/24/2008 CV History:  Currently asymptomatic Amaurosis Fugax No, Paresthesias No, Hemiparesis No                                      RIGHT             LEFT Brachial systolic pressure:         138               141 Brachial Doppler waveforms:         Normal            Normal Vertebral direction of flow:        Antegrade         Antegrade DUPLEX VELOCITIES (cm/sec) CCA peak systolic                   87                91 ECA peak systolic                   126               103 ICA peak systolic                   139               72 ICA end diastolic                   27                20 PLAQUE MORPHOLOGY:                  Calcific          Homogeneous PLAQUE AMOUNT:                      Minimal/moderate  Minimal PLAQUE LOCATION:                    CCA, ICA, ECA     CCA, ICA  IMPRESSION: 1. Right internal carotid artery velocity suggests 1%-39% stenosis. 2. Right external carotid artery stenosis present. 3. Patent left carotid endarterectomy site with no evidence of     restenosis of the internal carotid artery. 4. Stable in comparison to the previous exam.  ___________________________________________ V. Charlena Cross, MD  EM/MEDQ  D:  02/13/2012  T:  02/13/2012  Job:  [id number]

## 2012-03-04 ENCOUNTER — Telehealth: Payer: Self-pay | Admitting: Cardiology

## 2012-03-04 NOTE — Telephone Encounter (Signed)
Walk in pt Form " Pt Dropped Off Pre-Operative CLearance form from  [facility]" sent Message Nurse 03/04/12/KM

## 2012-03-06 ENCOUNTER — Encounter: Payer: Self-pay | Admitting: Cardiology

## 2012-03-06 DIAGNOSIS — I350 Nonrheumatic aortic (valve) stenosis: Secondary | ICD-10-CM | POA: Insufficient documentation

## 2012-03-27 ENCOUNTER — Encounter: Payer: Self-pay | Admitting: Cardiology

## 2012-03-28 ENCOUNTER — Encounter: Payer: Self-pay | Admitting: Cardiology

## 2012-03-28 ENCOUNTER — Ambulatory Visit (INDEPENDENT_AMBULATORY_CARE_PROVIDER_SITE_OTHER): Payer: Medicare Other | Admitting: Cardiology

## 2012-03-28 VITALS — BP 140/80 | HR 63 | Ht 70.0 in | Wt 184.6 lb

## 2012-03-28 DIAGNOSIS — I2510 Atherosclerotic heart disease of native coronary artery without angina pectoris: Secondary | ICD-10-CM

## 2012-03-28 DIAGNOSIS — I359 Nonrheumatic aortic valve disorder, unspecified: Secondary | ICD-10-CM

## 2012-03-28 DIAGNOSIS — I10 Essential (primary) hypertension: Secondary | ICD-10-CM

## 2012-03-28 DIAGNOSIS — I350 Nonrheumatic aortic (valve) stenosis: Secondary | ICD-10-CM

## 2012-03-28 DIAGNOSIS — Z01810 Encounter for preprocedural cardiovascular examination: Secondary | ICD-10-CM

## 2012-03-28 NOTE — Assessment & Plan Note (Signed)
The patient's cardiac status is stable. His exercise capacity is good despite his knee problem. He does not need any further cardiac workup. He is cleared for orthopedic surgery to his knee.

## 2012-03-28 NOTE — Assessment & Plan Note (Signed)
Blood pressures control. No change in therapy. 

## 2012-03-28 NOTE — Assessment & Plan Note (Signed)
His coronary disease is stable. His last intervention was October, 2010. LV function remains normal. He is active and having no significant symptoms. He is very reliable concerning his symptoms. He does not need exercise testing at this time.

## 2012-03-28 NOTE — Assessment & Plan Note (Signed)
He has mild aortic stenosis. There is no need for an echo at this time.

## 2012-03-28 NOTE — Progress Notes (Signed)
HPI Patient is seen today to followup coronary disease. He is also seen for preop clearance for knee surgery. The patient has been stable. I have followed him for many years. His carotid disease is followed carefully by vascular surgery. He underwent CABG in the past. He then had a drug-eluting stent to vein graft in October, 2010. His ejection fraction remains 60%. He is extremely reliable with his history. When he has symptoms he has a burning sensation in his chest. He has had none of this since his intervention in October, 2010. Despite his knee problem he can be active on an elliptical machine. With this he gets his heart rate up and he has no symptoms.  At this time he requires knee surgery.  Allergies  Allergen Reactions  . Propylene Glycol Rash    Current Outpatient Prescriptions  Medication Sig Dispense Refill  . allopurinol (ZYLOPRIM) 300 MG tablet Take 600 mg by mouth daily.       Marland Kitchen aspirin EC 81 MG tablet Take 81 mg by mouth daily. Hold for 2 days prior to surgery      . atorvastatin (LIPITOR) 20 MG tablet Take 20 mg by mouth daily.        . chlorthalidone (HYGROTON) 25 MG tablet Take 25 mg by mouth daily.        . clopidogrel (PLAVIX) 75 MG tablet Take 75 mg by mouth daily. ON HOLD for 5 days prior to surgery      . Glucosamine 500 MG CAPS Take 1 capsule by mouth daily.       Marland Kitchen HALOG 0.1 % CREA Apply 1 application topically as needed.       Marland Kitchen ibuprofen (ADVIL,MOTRIN) 200 MG tablet Take 200 mg by mouth every 6 (six) hours as needed. For pain       . levothyroxine (SYNTHROID, LEVOTHROID) 125 MCG tablet Take 125 mcg by mouth daily.       . metoprolol (TOPROL-XL) 100 MG 24 hr tablet Take 150 mg by mouth daily.       . multivitamin (THERAGRAN) per tablet Take 1 tablet by mouth daily.        . nitroGLYCERIN (NITROSTAT) 0.4 MG SL tablet Place 0.4 mg under the tongue every 5 (five) minutes x 3 doses as needed.       Marland Kitchen omeprazole (PRILOSEC) 20 MG capsule Take 20 mg by mouth daily.         Marland Kitchen pyridOXINE (VITAMIN B-6) 100 MG tablet Take 100 mg by mouth daily.       . Tamsulosin HCl (FLOMAX) 0.4 MG CAPS Take 0.4 mg by mouth daily.       Marland Kitchen testosterone cypionate (DEPOTESTOTERONE CYPIONATE) 200 MG/ML injection Inject 70 mg into the muscle every 14 (fourteen) days.       . traMADol (ULTRAM) 50 MG tablet Take 50 mg by mouth daily.       . valsartan (DIOVAN) 320 MG tablet Take 320 mg by mouth daily.         History   Social History  . Marital Status: Married    Spouse Name: N/A    Number of Children: N/A  . Years of Education: N/A   Occupational History  . Not on file.   Social History Main Topics  . Smoking status: Never Smoker   . Smokeless tobacco: Not on file  . Alcohol Use: Yes     Occasional  . Drug Use: No  . Sexually Active: Not on file  Other Topics Concern  . Not on file   Social History Narrative  . No narrative on file    Family History  Problem Relation Age of Onset  . Colon cancer Neg Hx     Past Medical History  Diagnosis Date  . Hyperlipidemia   . Benign prostatic hypertrophy   . HTN (hypertension)   . Carotid artery disease     Status post left carotid endarterectomy / Dr.   Myra Gianotti   . CAD (coronary artery disease)     DES, SVG to circumflex marginal, October, 2010 ( SVG to PDA and PLA patent , LIMA to LAD patent, EF 60%, mild inferior hypokinesis  . Syncope     Remote, etiology unknown  . Psoriasis     severe; with total skin exfoliation in the past resolved  . Systolic murmur   . Ejection fraction     EF 60% cardiac catheterization October, 2010  . Fluid overload     Mild,prn diuretic  . Nephrolithiasis   . Carpal tunnel syndrome   . Hx of decompressive lumbar laminectomy     Dr.Botero December, 2011  . Hx of CABG   . Aortic stenosis     Mild, echo, August, 2012    Past Surgical History  Procedure Date  . Coronary artery bypass graft 1995  . Decompression of left median nerve   . Left carotid endarectomy and dacron  patch angioplasty]   . Bilateral l3-4 laminectomies   . Removal of synovial cyst   . Posterior arthrodesis with autograft and allograft   . Colonoscopy 11/23/2011    Procedure: COLONOSCOPY;  Surgeon: Malissa Hippo, MD;  Location: [facility];  Service: Endoscopy;  Laterality: N/A;  730    ROS    Patient denies fever, chills, headache, sweats, rash, change in vision, change in hearing, chest pain, cough, nausea vomiting, urinary symptoms. All of the systems are reviewed and are negative.  PHYSICAL EXAM   Patient is oriented to person time and place. Affect is normal. There is no jugulovenous distention. Lungs are clear. Respiratory effort is nonlabored. Cardiac exam reveals S1 and S2. There no clicks. There is a systolic murmur compatible with for his mild aortic stenosis. Abdomen is soft. Is no peripheral edema. There no musculoskeletal deformities. There are no skin rashes.  Filed Vitals:   03/28/12 1021  BP: 140/80  Pulse: 63  Height: 5\' 10"  (1.778 m)  Weight: 184 lb 9.6 oz (83.734 kg)   EKG is done today and reviewed by me. It is compared to the prior tracing. He does have scattered PVCs. He has an interventricular conduction delay. His QRS is slightly longer than the past. There is no significant change.  ASSESSMENT & PLAN

## 2012-03-28 NOTE — Patient Instructions (Addendum)
Your physician wants you to follow-up in: 6 months.   You will receive a reminder letter in the mail two months in advance. If you don't receive a letter, please call our office to schedule the follow-up appointment.  You have been cleared by Dr Myrtis Ser for knee surgery from a cardiac standpoint.

## 2012-04-07 NOTE — H&P (Signed)
Daniel Campbell, MD   Daniel Code, PA-C [address] [place], [place], [place]  [address]                             [phone]  ORTHOPAEDIC HISTORY & PHYSICAL  [name] MRN:  [id number] DOB/SEX:  [birth year]/male  CHIEF COMPLAINT:  Painful left Knee  HISTORY: Patient is a 78 y.o. male presented with a history of pain in the left knee for 8 years. Onset of symptoms was gradual starting 8 years ago with gradually worsening course since that time. Prior procedures on the knee are meniscectomy. Patient has been treated conservatively with over-the-counter NSAIDs and activity modification. Patient currently rates pain in the knee at 6 out of 10 with activity. There is pain at night. present.  They have been previously treated with: NSAIDS: Ibuprofen, Naprosyn with mild improvement  Knee injection with corticosteroid  was performed Knee injection with visco supplementation was not performed Medications: Ibuprofen with mild improvement, still taking   PAST MEDICAL HISTORY: Patient Active Problem List   Diagnosis Date Noted  . Preop cardiovascular exam   . Aortic stenosis   . Occlusion and stenosis of carotid artery without mention of cerebral infarction 02/13/2012  . Carotid artery disease   . Hyperlipidemia   . HTN (hypertension)   . CAD (coronary artery disease)   . Syncope   . Psoriasis   . Systolic murmur   . Ejection fraction   . Fluid overload   . Carpal tunnel syndrome   . Hx of CABG   . BENIGN PROSTATIC HYPERTROPHY, HX OF 06/11/2009   Past Medical History  Diagnosis Date  . Hyperlipidemia   . Benign prostatic hypertrophy   . HTN (hypertension)   . Carotid artery disease     Status post left carotid endarterectomy / Dr.   Myra Gianotti   . CAD (coronary artery disease)     DES, SVG to circumflex marginal, October, 2010 ( SVG to PDA and PLA patent , LIMA to LAD patent, EF 60%, mild inferior hypokinesis  . Syncope     Remote, etiology unknown  . Psoriasis      severe; with total skin exfoliation in the past resolved  . Systolic murmur   . Ejection fraction     EF 60% cardiac catheterization October, 2010  . Fluid overload     Mild,prn diuretic  . Nephrolithiasis   . Carpal tunnel syndrome   . Hx of decompressive lumbar laminectomy     Dr.Botero December, 2011  . Hx of CABG   . Aortic stenosis     Mild, echo, August, 2012  . Preop cardiovascular exam     Preop clearance for knee surgery, August, 2013   Past Surgical History  Procedure Date  . Coronary artery bypass graft 1995  . Decompression of left median nerve   . Left carotid endarectomy and dacron patch angioplasty]   . Bilateral l3-4 laminectomies   . Removal of synovial cyst   . Posterior arthrodesis with autograft and allograft   . Colonoscopy 11/23/2011    Procedure: COLONOSCOPY;  Surgeon: Malissa Hippo, MD;  Location: [facility];  Service: Endoscopy;  Laterality: N/A;  730     MEDICATIONS:   No prescriptions prior to admission    ALLERGIES:   Allergies  Allergen Reactions  . Propylene Glycol Rash    REVIEW OF SYSTEMS:  A comprehensive review of systems was negative  except for: Eyes: positive for cataracts and contacts/glasses Ears, nose, mouth, throat, and face: positive for partial upper denture Cardiovascular: positive for previous CABG x3, Stent x1.  Hypertension since 1995 Gastrointestinal: positive for hemmorrhoids Genitourinary: positive for nocturia and h/o uric acid renal calculi Endocrine: positive for hypothyroidism H/O psoriasis with remission from Enbrel   FAMILY HISTORY:   Family History  Problem Relation Age of Onset  . Colon cancer Neg Hx     SOCIAL HISTORY:   History  Substance Use Topics  . Smoking status: Never Smoker   . Smokeless tobacco: Not on file  . Alcohol Use: Yes     Occasional      EXAMINATION: Vital signs in last 24 hours:     T 97.1, P 64, R 18, PB 140/70   General Appearance:    Alert, cooperative, no  distress, appears stated age  Head:    Normocephalic, without obvious abnormality, atraumatic  Eyes:    PERRL, conjunctiva/corneas clear, EOM's intact       Ears:    Normal TM's and external ear canals, both ears  Nose:   Nares normal, septum midline, mucosa normal, no drainage    or sinus tenderness  Throat:   Lips, mucosa, and tongue normal; teeth and gums normal  Neck:   Supple, symmetrical, trachea midline; no carotid   bruit   Back:     Symmetric, no curvature, ROM normal, no CVA tenderness  Lungs:     Clear to auscultation bilaterally, respirations unlabored  Chest wall:    No tenderness or deformity  Heart:    Regular rate and rhythm, S1 and S2 normal, no murmur, rub   or gallop  Abdomen:     Soft, non-tender, bowel sounds active all four quadrants,    no masses  Genitalia:    Not indicated for Ortho evaluation  Rectal:    Not indicated for Ortho evaluation  Extremities:   Extremities normal, atraumatic, no cyanosis or edema  Pulses:   1+ and symmetric all extremities  Skin:   Skin color, texture, turgor normal, no rashes or lesions     Neurologic:   CNII-XII intact. Oriented x3t    Musculoskeletal:  ROM 5-105 degrees, Ligaments pseudo laxity with varus/valgus stressing. Positive for effusion.  Crepitus and pain with ROM ,    Imaging Review Plain radiographs demonstrate severe degenerative joint disease of the left knee. The overall alignment is mild varus. The bone quality appears to be poor for age and reported activity level.  ASSESSMENT: End stage arthritis, left knee  Past Medical History  Diagnosis Date  . Hyperlipidemia   . Benign prostatic hypertrophy   . HTN (hypertension)   . Carotid artery disease     Status post left carotid endarterectomy / Dr.   Myra Gianotti   . CAD (coronary artery disease)     DES, SVG to circumflex marginal, October, 2010 ( SVG to PDA and PLA patent , LIMA to LAD patent, EF 60%, mild inferior hypokinesis  . Syncope     Remote, etiology  unknown  . Psoriasis     severe; with total skin exfoliation in the past resolved  . Systolic murmur   . Ejection fraction     EF 60% cardiac catheterization October, 2010  . Fluid overload     Mild,prn diuretic  . Nephrolithiasis   . Carpal tunnel syndrome   . Hx of decompressive lumbar laminectomy     Dr.Botero December, 2011  . Hx of CABG   .  Aortic stenosis     Mild, echo, August, 2012  . Preop cardiovascular exam     Preop clearance for knee surgery, August, 2013    PLAN: Plan for left total knee replacement.  The patient history, physical examination and imaging studies are consistent with progressive degenerative joint disease of the left knee. The patient has failed conservative treatment.  The clearance notes were reviewed.  After discussion with the patient it was felt that Total Knee Replacement was indicated. The procedure,  risks, and benefits of total knee arthroplasty were presented and reviewed. The risks including but not limited to aseptic loosening, infection, blood clots, vascular and nerve injury, stiffness, patella tracking problems and fracture complications among others were discussed. The patient acknowledged the explanation, agreed to proceed with total knee replacement.  PETRARCA,BRIAN 04/07/2012, 9:20 PM

## 2012-04-08 NOTE — Pre-Procedure Instructions (Signed)
20 HELIX LAFONTAINE  04/08/2012   Your procedure is scheduled on:  Tuesday, August 27th.  Report to [facility] at 5:30 AM.  Call this number if you have problems the morning of surgery: [phone]   Remember:   Do not eat food or drink any fluid:After Midnight.      Take these medicines the morning of surgery with A SIP OF WATER: Allopurinol (Zyloprim), Levothyroxine (Synthyroid), Metoprolol (Toprol XL),  Omeprazole (Prilosec), Tamsulosin (Flomax). May take Tramadol (Ultram) if needed.  Stop taking Clopidogrel (Plavix) _________. Do not take Ibuprofen (Advil, Motrin) or any Herbal medications.   Do not wear jewelry, make-up or nail polish.  Do not wear lotions, powders, or perfumes. You may wear deodorant.  Do not shave 48 hours prior to surgery. Men may shave face and neck.  Do not bring valuables to the hospital.  Contacts, dentures or bridgework may not be worn into surgery.  Leave suitcase in the car. After surgery it may be brought to your room.  For patients admitted to the hospital, checkout time is 11:00 AM the day of discharge.   Patients discharged the day of surgery will not be allowed to drive home.  Name and phone number of your driver: NA  Special Instructions: Incentive Spirometry - Practice and bring it with you on the day of surgery. and CHG Shower Use Special Wash: 1/2 bottle night before surgery and 1/2 bottle morning of surgery.   Please read over the following fact sheets that you were given: Pain Booklet, Coughing and Deep Breathing, Blood Transfusion Information and Surgical Site Infection Prevention

## 2012-04-09 ENCOUNTER — Encounter (HOSPITAL_COMMUNITY): Payer: Self-pay

## 2012-04-09 ENCOUNTER — Encounter (HOSPITAL_COMMUNITY)
Admission: RE | Admit: 2012-04-09 | Discharge: 2012-04-09 | Disposition: A | Discharge status: Home / Self Care | Payer: Medicare Other | Source: Ambulatory Visit | Attending: Orthopedic Surgery | Admitting: Orthopedic Surgery

## 2012-04-09 ENCOUNTER — Encounter (HOSPITAL_COMMUNITY): Payer: Self-pay | Admitting: Pharmacy Technician

## 2012-04-09 ENCOUNTER — Encounter (HOSPITAL_COMMUNITY)
Admission: RE | Admit: 2012-04-09 | Discharge: 2012-04-09 | Disposition: A | Discharge status: Home / Self Care | Payer: Medicare Other | Source: Ambulatory Visit | Attending: Orthopaedic Surgery | Admitting: Orthopaedic Surgery

## 2012-04-09 HISTORY — DX: Gastro-esophageal reflux disease without esophagitis: K21.9

## 2012-04-09 HISTORY — DX: Hypothyroidism, unspecified: E03.9

## 2012-04-09 HISTORY — DX: Personal history of urinary calculi: Z87.442

## 2012-04-09 LAB — COMPREHENSIVE METABOLIC PANEL
ALT: 26 U/L (ref 0–53)
AST: 33 U/L (ref 0–37)
Albumin: 3.9 g/dL (ref 3.5–5.2)
Alkaline Phosphatase: 87 U/L (ref 39–117)
CO2: 32 mEq/L (ref 19–32)
Chloride: 98 mEq/L (ref 96–112)
Creatinine, Ser: 1.11 mg/dL (ref 0.50–1.35)
GFR calc non Af Amer: 61 mL/min — ABNORMAL LOW (ref >90)
Potassium: 3.5 mEq/L (ref 3.5–5.1)
Sodium: 137 mEq/L (ref 135–145)
Total Bilirubin: 0.5 mg/dL (ref 0.3–1.2)

## 2012-04-09 LAB — URINALYSIS, ROUTINE W REFLEX MICROSCOPIC
Bilirubin Urine: NEGATIVE
Glucose, UA: NEGATIVE mg/dL
Nitrite: NEGATIVE
Specific Gravity, Urine: 1.013 (ref 1.005–1.030)
pH: 6.5 (ref 5.0–8.0)

## 2012-04-09 LAB — PROTIME-INR: INR: 1.03 (ref 0.00–1.49)

## 2012-04-09 LAB — SURGICAL PCR SCREEN
MRSA, PCR: NEGATIVE
Staphylococcus aureus: NEGATIVE

## 2012-04-09 LAB — CBC
HCT: 44.6 % (ref 39.0–52.0)
Hemoglobin: 15.3 g/dL (ref 13.0–17.0)
MCH: 31.3 pg (ref 26.0–34.0)
RBC: 4.89 MIL/uL (ref 4.22–5.81)

## 2012-04-09 LAB — APTT: aPTT: 35 seconds (ref 24–37)

## 2012-04-09 NOTE — Progress Notes (Addendum)
Primary Physician - Dr. [name] in [place]  Cardiologist - Dr. Myrtis Ser EKG in August 2013 in epic Echo 2012 in epic Cath 2010 in epic

## 2012-04-15 MED ORDER — CEFAZOLIN SODIUM-DEXTROSE 2-3 GM-% IV SOLR
2.0000 g | INTRAVENOUS | Status: AC
  Administered 2012-04-16: 2 g via INTRAVENOUS
  Filled 2012-04-15: qty 50

## 2012-04-16 ENCOUNTER — Encounter (HOSPITAL_COMMUNITY): Payer: Self-pay | Admitting: Surgery

## 2012-04-16 ENCOUNTER — Encounter (HOSPITAL_COMMUNITY)
Admission: RE | Disposition: A | Discharge status: Home / Self Care | Payer: Self-pay | Source: Ambulatory Visit | Attending: Orthopaedic Surgery

## 2012-04-16 ENCOUNTER — Encounter (HOSPITAL_COMMUNITY): Payer: Self-pay | Admitting: General Practice

## 2012-04-16 ENCOUNTER — Encounter (HOSPITAL_COMMUNITY): Payer: Self-pay | Admitting: Certified Registered"

## 2012-04-16 ENCOUNTER — Inpatient Hospital Stay (HOSPITAL_COMMUNITY): Payer: Medicare Other | Admitting: Certified Registered"

## 2012-04-16 ENCOUNTER — Inpatient Hospital Stay (HOSPITAL_COMMUNITY)
Admission: RE | Admit: 2012-04-16 | Discharge: 2012-04-19 | DRG: 470 | Disposition: A | Discharge status: Home / Self Care | Payer: Medicare Other | Source: Ambulatory Visit | Attending: Orthopaedic Surgery | Admitting: Orthopaedic Surgery

## 2012-04-16 DIAGNOSIS — E871 Hypo-osmolality and hyponatremia: Secondary | ICD-10-CM | POA: Diagnosis present

## 2012-04-16 DIAGNOSIS — N400 Enlarged prostate without lower urinary tract symptoms: Secondary | ICD-10-CM | POA: Diagnosis present

## 2012-04-16 DIAGNOSIS — E876 Hypokalemia: Secondary | ICD-10-CM | POA: Diagnosis not present

## 2012-04-16 DIAGNOSIS — M1710 Unilateral primary osteoarthritis, unspecified knee: Secondary | ICD-10-CM | POA: Diagnosis present

## 2012-04-16 DIAGNOSIS — I2510 Atherosclerotic heart disease of native coronary artery without angina pectoris: Secondary | ICD-10-CM | POA: Diagnosis present

## 2012-04-16 DIAGNOSIS — IMO0002 Reserved for concepts with insufficient information to code with codable children: Principal | ICD-10-CM | POA: Diagnosis present

## 2012-04-16 DIAGNOSIS — D62 Acute posthemorrhagic anemia: Secondary | ICD-10-CM | POA: Diagnosis not present

## 2012-04-16 DIAGNOSIS — E785 Hyperlipidemia, unspecified: Secondary | ICD-10-CM | POA: Diagnosis present

## 2012-04-16 DIAGNOSIS — Z951 Presence of aortocoronary bypass graft: Secondary | ICD-10-CM

## 2012-04-16 DIAGNOSIS — I6529 Occlusion and stenosis of unspecified carotid artery: Secondary | ICD-10-CM | POA: Diagnosis present

## 2012-04-16 HISTORY — DX: Unspecified osteoarthritis, unspecified site: M19.90

## 2012-04-16 HISTORY — PX: TOTAL KNEE ARTHROPLASTY: SHX125

## 2012-04-16 SURGERY — ARTHROPLASTY, KNEE, TOTAL
Anesthesia: General | Site: Knee | Laterality: Left | Wound class: Clean

## 2012-04-16 MED ORDER — PROPOFOL 10 MG/ML IV EMUL
INTRAVENOUS | Status: DC | PRN
  Administered 2012-04-16: 180 mg via INTRAVENOUS

## 2012-04-16 MED ORDER — ACETAMINOPHEN 10 MG/ML IV SOLN
1000.0000 mg | Freq: Four times a day (QID) | INTRAVENOUS | Status: AC
  Administered 2012-04-16 – 2012-04-17 (×4): 1000 mg via INTRAVENOUS
  Filled 2012-04-16 (×5): qty 100

## 2012-04-16 MED ORDER — IRBESARTAN 300 MG PO TABS
300.0000 mg | ORAL_TABLET | Freq: Every day | ORAL | Status: DC
  Administered 2012-04-16 – 2012-04-19 (×4): 300 mg via ORAL
  Filled 2012-04-16 (×4): qty 1

## 2012-04-16 MED ORDER — BUPIVACAINE-EPINEPHRINE PF 0.25-1:200000 % IJ SOLN
INTRAMUSCULAR | Status: AC
  Filled 2012-04-16: qty 30

## 2012-04-16 MED ORDER — METOCLOPRAMIDE HCL 5 MG/ML IJ SOLN: 10.0000 mg | Freq: Once | INTRAMUSCULAR | Status: DC | PRN

## 2012-04-16 MED ORDER — FENTANYL CITRATE 0.05 MG/ML IJ SOLN
INTRAMUSCULAR | Status: DC | PRN
  Administered 2012-04-16 (×4): 50 ug via INTRAVENOUS

## 2012-04-16 MED ORDER — GLYCOPYRROLATE 0.2 MG/ML IJ SOLN
INTRAMUSCULAR | Status: DC | PRN
  Administered 2012-04-16: 0.2 mg via INTRAVENOUS
  Administered 2012-04-16: 0.4 mg via INTRAVENOUS

## 2012-04-16 MED ORDER — MIDAZOLAM HCL 5 MG/5ML IJ SOLN
INTRAMUSCULAR | Status: DC | PRN
  Administered 2012-04-16: 2 mg via INTRAVENOUS

## 2012-04-16 MED ORDER — ONDANSETRON HCL 4 MG/2ML IJ SOLN: 4.0000 mg | Freq: Four times a day (QID) | INTRAMUSCULAR | Status: DC | PRN

## 2012-04-16 MED ORDER — CEFAZOLIN SODIUM 1-5 GM-% IV SOLN
1.0000 g | Freq: Four times a day (QID) | INTRAVENOUS | Status: AC
  Administered 2012-04-16 (×2): 1 g via INTRAVENOUS
  Filled 2012-04-16 (×2): qty 50

## 2012-04-16 MED ORDER — ONDANSETRON HCL 4 MG PO TABS: 4.0000 mg | ORAL_TABLET | Freq: Four times a day (QID) | ORAL | Status: DC | PRN

## 2012-04-16 MED ORDER — ALLOPURINOL 300 MG PO TABS
600.0000 mg | ORAL_TABLET | Freq: Every day | ORAL | Status: DC
  Administered 2012-04-17 – 2012-04-19 (×3): 600 mg via ORAL
  Filled 2012-04-16 (×3): qty 2

## 2012-04-16 MED ORDER — LEVOTHYROXINE SODIUM 125 MCG PO TABS
125.0000 ug | ORAL_TABLET | Freq: Every day | ORAL | Status: DC
  Administered 2012-04-17 – 2012-04-19 (×3): 125 ug via ORAL
  Filled 2012-04-16 (×5): qty 1

## 2012-04-16 MED ORDER — OXYCODONE HCL 5 MG/5ML PO SOLN: 5.0000 mg | Freq: Once | ORAL | Status: DC | PRN

## 2012-04-16 MED ORDER — OXYCODONE HCL 5 MG PO TABS
5.0000 mg | ORAL_TABLET | ORAL | Status: DC | PRN
  Administered 2012-04-16 – 2012-04-17 (×2): 10 mg via ORAL
  Administered 2012-04-17 – 2012-04-18 (×2): 5 mg via ORAL
  Filled 2012-04-16: qty 1
  Filled 2012-04-16: qty 2
  Filled 2012-04-16: qty 1
  Filled 2012-04-16: qty 2

## 2012-04-16 MED ORDER — FLUOCINONIDE 0.05 % EX CREA: 1.0000 "application " | TOPICAL_CREAM | Freq: Two times a day (BID) | CUTANEOUS | Status: DC | PRN

## 2012-04-16 MED ORDER — NEOSTIGMINE METHYLSULFATE 1 MG/ML IJ SOLN
INTRAMUSCULAR | Status: DC | PRN
  Administered 2012-04-16: 3 mg via INTRAVENOUS

## 2012-04-16 MED ORDER — SODIUM CHLORIDE 0.9 % IV SOLN
75.0000 mL/h | INTRAVENOUS | Status: DC
  Administered 2012-04-16 – 2012-04-17 (×2): 75 mL/h via INTRAVENOUS

## 2012-04-16 MED ORDER — METHOCARBAMOL 500 MG PO TABS
500.0000 mg | ORAL_TABLET | Freq: Four times a day (QID) | ORAL | Status: DC | PRN
  Administered 2012-04-16 – 2012-04-18 (×5): 500 mg via ORAL
  Filled 2012-04-16 (×6): qty 1

## 2012-04-16 MED ORDER — BUPIVACAINE-EPINEPHRINE 0.25% -1:200000 IJ SOLN
INTRAMUSCULAR | Status: DC | PRN
  Administered 2012-04-16: 30 mL

## 2012-04-16 MED ORDER — DOCUSATE SODIUM 100 MG PO CAPS
100.0000 mg | ORAL_CAPSULE | Freq: Two times a day (BID) | ORAL | Status: DC
  Administered 2012-04-16 – 2012-04-19 (×7): 100 mg via ORAL
  Filled 2012-04-16 (×8): qty 1

## 2012-04-16 MED ORDER — METOCLOPRAMIDE HCL 5 MG PO TABS
5.0000 mg | ORAL_TABLET | Freq: Three times a day (TID) | ORAL | Status: DC | PRN
  Filled 2012-04-16: qty 2

## 2012-04-16 MED ORDER — THROMBIN 20000 UNITS EX KIT
PACK | CUTANEOUS | Status: DC | PRN
  Administered 2012-04-16: 20000 [IU] via TOPICAL

## 2012-04-16 MED ORDER — NEOSTIGMINE METHYLSULFATE 1 MG/ML IJ SOLN: INTRAMUSCULAR | Status: DC | PRN

## 2012-04-16 MED ORDER — ONDANSETRON HCL 4 MG/2ML IJ SOLN
INTRAMUSCULAR | Status: DC | PRN
  Administered 2012-04-16: 4 mg via INTRAVENOUS

## 2012-04-16 MED ORDER — NITROGLYCERIN 0.4 MG SL SUBL: 0.4000 mg | SUBLINGUAL_TABLET | SUBLINGUAL | Status: DC | PRN

## 2012-04-16 MED ORDER — OXYCODONE HCL 5 MG PO TABS: 5.0000 mg | ORAL_TABLET | Freq: Once | ORAL | Status: DC | PRN

## 2012-04-16 MED ORDER — HYDROMORPHONE HCL PF 1 MG/ML IJ SOLN: 0.2500 mg | INTRAMUSCULAR | Status: DC | PRN

## 2012-04-16 MED ORDER — MAGNESIUM HYDROXIDE 400 MG/5ML PO SUSP: 30.0000 mL | Freq: Every day | ORAL | Status: DC | PRN

## 2012-04-16 MED ORDER — BUPIVACAINE HCL (PF) 0.5 % IJ SOLN
INTRAMUSCULAR | Status: DC | PRN
  Administered 2012-04-16: 25 mL

## 2012-04-16 MED ORDER — SODIUM CHLORIDE 0.9 % IV SOLN: INTRAVENOUS | Status: DC

## 2012-04-16 MED ORDER — THROMBIN 20000 UNITS EX KIT
PACK | CUTANEOUS | Status: AC
  Filled 2012-04-16: qty 1

## 2012-04-16 MED ORDER — CHLORTHALIDONE 25 MG PO TABS
25.0000 mg | ORAL_TABLET | Freq: Every day | ORAL | Status: DC
  Administered 2012-04-16 – 2012-04-18 (×3): 25 mg via ORAL
  Filled 2012-04-16 (×3): qty 1

## 2012-04-16 MED ORDER — ATORVASTATIN CALCIUM 20 MG PO TABS
20.0000 mg | ORAL_TABLET | Freq: Every day | ORAL | Status: DC
  Administered 2012-04-16 – 2012-04-18 (×3): 20 mg via ORAL
  Filled 2012-04-16 (×4): qty 1

## 2012-04-16 MED ORDER — PANTOPRAZOLE SODIUM 40 MG PO TBEC
40.0000 mg | DELAYED_RELEASE_TABLET | Freq: Every day | ORAL | Status: DC
  Administered 2012-04-17 – 2012-04-19 (×3): 40 mg via ORAL
  Filled 2012-04-16 (×3): qty 1

## 2012-04-16 MED ORDER — EPHEDRINE SULFATE 50 MG/ML IJ SOLN
INTRAMUSCULAR | Status: DC | PRN
  Administered 2012-04-16: 5 mg via INTRAVENOUS
  Administered 2012-04-16 (×2): 10 mg via INTRAVENOUS
  Administered 2012-04-16: 5 mg via INTRAVENOUS
  Administered 2012-04-16: 10 mg via INTRAVENOUS

## 2012-04-16 MED ORDER — BISACODYL 10 MG RE SUPP: 10.0000 mg | Freq: Every day | RECTAL | Status: DC | PRN

## 2012-04-16 MED ORDER — HYDROMORPHONE HCL PF 1 MG/ML IJ SOLN: 0.5000 mg | INTRAMUSCULAR | Status: DC | PRN

## 2012-04-16 MED ORDER — LACTATED RINGERS IV SOLN
INTRAVENOUS | Status: DC | PRN
  Administered 2012-04-16 (×3): via INTRAVENOUS

## 2012-04-16 MED ORDER — KETOROLAC TROMETHAMINE 15 MG/ML IJ SOLN
7.5000 mg | Freq: Four times a day (QID) | INTRAMUSCULAR | Status: AC
  Administered 2012-04-16 – 2012-04-17 (×4): 7.5 mg via INTRAVENOUS
  Filled 2012-04-16 (×4): qty 1

## 2012-04-16 MED ORDER — ROCURONIUM BROMIDE 100 MG/10ML IV SOLN
INTRAVENOUS | Status: DC | PRN
  Administered 2012-04-16: 50 mg via INTRAVENOUS

## 2012-04-16 MED ORDER — SODIUM CHLORIDE 0.9 % IR SOLN
Status: DC | PRN
  Administered 2012-04-16: 3000 mL

## 2012-04-16 MED ORDER — CHLORHEXIDINE GLUCONATE 4 % EX LIQD: 60.0000 mL | Freq: Every day | CUTANEOUS | Status: DC

## 2012-04-16 MED ORDER — CHLORHEXIDINE GLUCONATE 4 % EX LIQD: 60.0000 mL | Freq: Once | CUTANEOUS | Status: DC

## 2012-04-16 MED ORDER — ACETAMINOPHEN 10 MG/ML IV SOLN
1000.0000 mg | Freq: Once | INTRAVENOUS | Status: DC
  Administered 2012-04-16: 1000 mg via INTRAVENOUS

## 2012-04-16 MED ORDER — 0.9 % SODIUM CHLORIDE (POUR BTL) OPTIME
TOPICAL | Status: DC | PRN
  Administered 2012-04-16: 1000 mL

## 2012-04-16 MED ORDER — ACETAMINOPHEN 10 MG/ML IV SOLN
INTRAVENOUS | Status: AC
  Filled 2012-04-16: qty 100

## 2012-04-16 MED ORDER — METOCLOPRAMIDE HCL 5 MG/ML IJ SOLN: 5.0000 mg | Freq: Three times a day (TID) | INTRAMUSCULAR | Status: DC | PRN

## 2012-04-16 MED ORDER — TAMSULOSIN HCL 0.4 MG PO CAPS
0.4000 mg | ORAL_CAPSULE | Freq: Every day | ORAL | Status: DC
Start: 2012-04-17 — End: 2012-04-19
  Administered 2012-04-17 – 2012-04-19 (×3): 0.4 mg via ORAL
  Filled 2012-04-16 (×3): qty 1

## 2012-04-16 MED ORDER — RIVAROXABAN 10 MG PO TABS
10.0000 mg | ORAL_TABLET | ORAL | Status: DC
  Administered 2012-04-16 – 2012-04-18 (×3): 10 mg via ORAL
  Filled 2012-04-16 (×4): qty 1

## 2012-04-16 MED ORDER — FLEET ENEMA 7-19 GM/118ML RE ENEM: 1.0000 | ENEMA | Freq: Once | RECTAL | Status: AC | PRN

## 2012-04-16 MED ORDER — PHENYLEPHRINE HCL 10 MG/ML IJ SOLN
INTRAMUSCULAR | Status: DC | PRN
  Administered 2012-04-16: 80 ug via INTRAVENOUS
  Administered 2012-04-16: 40 ug via INTRAVENOUS
  Administered 2012-04-16: 80 ug via INTRAVENOUS
  Administered 2012-04-16: 40 ug via INTRAVENOUS
  Administered 2012-04-16: 80 ug via INTRAVENOUS

## 2012-04-16 MED ORDER — DEXAMETHASONE SODIUM PHOSPHATE 4 MG/ML IJ SOLN
INTRAMUSCULAR | Status: DC | PRN
  Administered 2012-04-16: 8 mg via INTRAVENOUS

## 2012-04-16 MED ORDER — METHOCARBAMOL 100 MG/ML IJ SOLN
500.0000 mg | Freq: Four times a day (QID) | INTRAVENOUS | Status: DC | PRN
  Filled 2012-04-16: qty 5

## 2012-04-16 MED ORDER — METOPROLOL SUCCINATE ER 50 MG PO TB24
150.0000 mg | ORAL_TABLET | Freq: Every day | ORAL | Status: DC
  Administered 2012-04-17: 150 mg via ORAL
  Filled 2012-04-16 (×3): qty 1

## 2012-04-16 SURGICAL SUPPLY — 59 items
BANDAGE ESMARK 6X9 LF (GAUZE/BANDAGES/DRESSINGS) ×1 IMPLANT
BLADE SAGITTAL 25.0X1.19X90 (BLADE) ×2 IMPLANT
BNDG ESMARK 6X9 LF (GAUZE/BANDAGES/DRESSINGS) ×2
BOWL SMART MIX CTS (DISPOSABLE) ×2 IMPLANT
CEMENT HV SMART SET (Cement) ×4 IMPLANT
CLOTH BEACON ORANGE TIMEOUT ST (SAFETY) ×2 IMPLANT
COVER BACK TABLE 24X17X13 BIG (DRAPES) ×2 IMPLANT
COVER SURGICAL LIGHT HANDLE (MISCELLANEOUS) ×2 IMPLANT
CUFF TOURNIQUET SINGLE 34IN LL (TOURNIQUET CUFF) ×2 IMPLANT
CUFF TOURNIQUET SINGLE 44IN (TOURNIQUET CUFF) IMPLANT
DRAPE EXTREMITY T 121X128X90 (DRAPE) ×2 IMPLANT
DRAPE PROXIMA HALF (DRAPES) ×4 IMPLANT
DRSG ADAPTIC 3X8 NADH LF (GAUZE/BANDAGES/DRESSINGS) IMPLANT
DRSG PAD ABDOMINAL 8X10 ST (GAUZE/BANDAGES/DRESSINGS) IMPLANT
DURAPREP 26ML APPLICATOR (WOUND CARE) ×2 IMPLANT
ELECT CAUTERY BLADE 6.4 (BLADE) ×2 IMPLANT
ELECT REM PT RETURN 9FT ADLT (ELECTROSURGICAL) ×2
ELECTRODE REM PT RTRN 9FT ADLT (ELECTROSURGICAL) ×1 IMPLANT
EVACUATOR 1/8 PVC DRAIN (DRAIN) IMPLANT
FACESHIELD LNG OPTICON STERILE (SAFETY) ×4 IMPLANT
FLOSEAL 10ML (HEMOSTASIS) IMPLANT
GLOVE BIOGEL PI IND STRL 8 (GLOVE) ×1 IMPLANT
GLOVE BIOGEL PI IND STRL 8.5 (GLOVE) ×1 IMPLANT
GLOVE BIOGEL PI INDICATOR 8 (GLOVE) ×1
GLOVE BIOGEL PI INDICATOR 8.5 (GLOVE) ×1
GLOVE ECLIPSE 8.0 STRL XLNG CF (GLOVE) ×6 IMPLANT
GLOVE SURG ORTHO 8.5 STRL (GLOVE) ×4 IMPLANT
GOWN PREVENTION PLUS XXLARGE (GOWN DISPOSABLE) ×2 IMPLANT
GOWN STRL NON-REIN LRG LVL3 (GOWN DISPOSABLE) ×4 IMPLANT
HANDPIECE INTERPULSE COAX TIP (DISPOSABLE) ×1
KIT BASIN OR (CUSTOM PROCEDURE TRAY) ×2 IMPLANT
KIT ROOM TURNOVER OR (KITS) ×2 IMPLANT
MANIFOLD NEPTUNE II (INSTRUMENTS) ×2 IMPLANT
MARKER SPHERE PSV REFLC THRD 5 (MARKER) IMPLANT
NEEDLE 22X1 1/2 (OR ONLY) (NEEDLE) IMPLANT
NS IRRIG 1000ML POUR BTL (IV SOLUTION) ×2 IMPLANT
PACK TOTAL JOINT (CUSTOM PROCEDURE TRAY) ×2 IMPLANT
PAD ARMBOARD 7.5X6 YLW CONV (MISCELLANEOUS) ×4 IMPLANT
PAD CAST 4YDX4 CTTN HI CHSV (CAST SUPPLIES) IMPLANT
PADDING CAST COTTON 4X4 STRL (CAST SUPPLIES)
PADDING CAST COTTON 6X4 STRL (CAST SUPPLIES) IMPLANT
PIN SCHANZ 4MM 130MM (PIN) IMPLANT
SET HNDPC FAN SPRY TIP SCT (DISPOSABLE) ×1 IMPLANT
SPONGE GAUZE 4X4 12PLY (GAUZE/BANDAGES/DRESSINGS) IMPLANT
STAPLER VISISTAT 35W (STAPLE) ×2 IMPLANT
SUCTION FRAZIER TIP 10 FR DISP (SUCTIONS) ×2 IMPLANT
SUT BONE WAX W31G (SUTURE) ×2 IMPLANT
SUT ETHIBOND NAB CT1 #1 30IN (SUTURE) ×6 IMPLANT
SUT MNCRL AB 3-0 PS2 18 (SUTURE) ×2 IMPLANT
SUT VIC AB 0 CT1 27 (SUTURE) ×1
SUT VIC AB 0 CT1 27XBRD ANBCTR (SUTURE) ×1 IMPLANT
SUT VIC AB 1 CT1 27 (SUTURE)
SUT VIC AB 1 CT1 27XBRD ANBCTR (SUTURE) IMPLANT
SYR CONTROL 10ML LL (SYRINGE) IMPLANT
TOWEL OR 17X24 6PK STRL BLUE (TOWEL DISPOSABLE) ×2 IMPLANT
TOWEL OR 17X26 10 PK STRL BLUE (TOWEL DISPOSABLE) ×2 IMPLANT
TRAY FOLEY CATH 14FR (SET/KITS/TRAYS/PACK) ×2 IMPLANT
WATER STERILE IRR 1000ML POUR (IV SOLUTION) ×4 IMPLANT
WRAP KNEE MAXI GEL POST OP (GAUZE/BANDAGES/DRESSINGS) ×2 IMPLANT

## 2012-04-16 NOTE — Progress Notes (Signed)
Patient ID: [name], male   DOB: June 18, 1931, 78 y.o.   MRN: [id number] There has been no change in health status since  the current H&P.I have examined the patient and discussed the surgery. No contraindications to the planned procedure exist.

## 2012-04-16 NOTE — Transfer of Care (Signed)
Immediate Anesthesia Transfer of Care Note  Patient: [name]  Procedure(s) Performed: Procedure(s) (LRB): TOTAL KNEE ARTHROPLASTY (Left)  Patient Location: PACU  Anesthesia Type: GA combined with regional for post-op pain  Level of Consciousness: awake, alert  and oriented  Airway & Oxygen Therapy: Patient Spontanous Breathing and Patient connected to nasal cannula oxygen  Post-op Assessment: Report given to PACU RN, Post -op Vital signs reviewed and stable and Patient moving all extremities X 4  Post vital signs: Reviewed and stable  Complications: No apparent anesthesia complications

## 2012-04-16 NOTE — Progress Notes (Signed)
Orthopedic Tech Progress Note Patient Details:  [name] Mar 03, 1931 [id number] CPM applied to Left LE with appropriate settings; OHF with trapeze bar applied to bed CPM Left Knee CPM Left Knee: On Left Knee Flexion (Degrees): 60  Left Knee Extension (Degrees): 0    Asia R Thompson 04/16/2012, 10:36 AM

## 2012-04-16 NOTE — Plan of Care (Signed)
Problem: Consults Goal: Diagnosis- Total Joint Replacement Primary Total Knee Left     

## 2012-04-16 NOTE — Anesthesia Procedure Notes (Signed)
Anesthesia Regional Block:  Femoral nerve block  Pre-Anesthetic Checklist: ,, timeout performed, Correct Patient, Correct Site, Correct Laterality, Correct Procedure, Correct Position, site marked, Risks and benefits discussed,  Surgical consent,  Pre-op evaluation,  At surgeon's request and post-op pain management  Laterality: Left  Prep: chloraprep       Needles:   Needle Type: Other   (Arrow Echogenic)   Needle Length: 9cm  Needle Gauge: 21    Additional Needles:  Procedures: ultrasound guided Femoral nerve block Narrative:  Start time: 04/16/2012 6:53 AM End time: 04/16/2012 7:00 AM Injection made incrementally with aspirations every 5 mL.  Performed by: Personally  Anesthesiologist: C. Addi Pak MD  Additional Notes: Ultrasound guidance used to: id relevant anatomy, confirm needle position, local anesthetic spread, avoidance of vascular puncture. Picture saved. No complications. Block performed personally by Janetta Hora. Gelene Mink, MD    Femoral nerve block

## 2012-04-16 NOTE — Anesthesia Postprocedure Evaluation (Signed)
Anesthesia Post Note  Patient: [name]  Procedure(s) Performed: Procedure(s) (LRB): TOTAL KNEE ARTHROPLASTY (Left)  Anesthesia type: general  Patient location: PACU  Post pain: Pain level controlled  Post assessment: Patient's Cardiovascular Status Stable  Last Vitals:  Filed Vitals:   04/16/12 0940  BP:   Pulse:   Temp: 36.4 C  Resp:     Post vital signs: Reviewed and stable  Level of consciousness: sedated  Complications: No apparent anesthesia complications

## 2012-04-16 NOTE — Anesthesia Preprocedure Evaluation (Addendum)
Anesthesia Evaluation  Patient identified by MRN, date of birth, ID band Patient awake    Reviewed: Allergy & Precautions, H&P , NPO status , Patient's Chart, lab work & pertinent test results, reviewed documented beta blocker date and time   Airway Mallampati: II TM Distance: >3 FB Neck ROM: full    Dental  (+) Teeth Intact, Missing and Dental Advisory Given   Pulmonary neg pulmonary ROS,  breath sounds clear to auscultation        Cardiovascular hypertension, Pt. on medications and Pt. on home beta blockers + CAD, + CABG and + Peripheral Vascular Disease + Valvular Problems/Murmurs AS Rhythm:regular     Neuro/Psych  Neuromuscular disease negative psych ROS   GI/Hepatic Neg liver ROS, GERD-  Medicated and Controlled,  Endo/Other  Hypothyroidism   Renal/GU Renal disease  negative genitourinary   Musculoskeletal   Abdominal   Peds  Hematology negative hematology ROS (+)   Anesthesia Other Findings See surgeon's H&P   Reproductive/Obstetrics negative OB ROS                          Anesthesia Physical Anesthesia Plan  ASA: III  Anesthesia Plan: General   Post-op Pain Management:    Induction:   Airway Management Planned: Oral ETT  Additional Equipment:   Intra-op Plan:   Post-operative Plan: Extubation in OR  Informed Consent: I have reviewed the patients History and Physical, chart, labs and discussed the procedure including the risks, benefits and alternatives for the proposed anesthesia with the patient or authorized representative who has indicated his/her understanding and acceptance.   Dental Advisory Given  Plan Discussed with: CRNA and Surgeon  Anesthesia Plan Comments:         Anesthesia Quick Evaluation

## 2012-04-16 NOTE — Op Note (Signed)
PATIENT ID:      [name]  MRN:     [id number] DOB/AGE:    01-25-1931 / 78 y.o.       OPERATIVE REPORT    DATE OF PROCEDURE:  04/16/2012       PREOPERATIVE DIAGNOSIS:   osteoarthritis left knee                                                       There is no height or weight on file to calculate BMI.     POSTOPERATIVE DIAGNOSIS:   osteoarthritis left knee                                                                     There is no height or weight on file to calculate BMI.     PROCEDURE:  Procedure(s):LEFT TOTAL KNEE ARTHROPLASTY     SURGEON:  Norlene Campbell, MD    ASSISTANT:   Jacqualine Code, PA-C   (Present and scrubbed throughout the case, critical for assistance with exposure, retraction, instrumentation, and closure.)          ANESTHESIA: Regional     DRAINS: (left knee) Hemovact drain(s) in the open with  Suction Open :       TOURNIQUET TIME: 69 minutes   COMPLICATIONS:  None    CONDITION:  stable  DESCRIPTION:[id number]   [name] 04/16/2012, 9:13 AM

## 2012-04-17 ENCOUNTER — Encounter (HOSPITAL_COMMUNITY): Payer: Self-pay | Admitting: Orthopaedic Surgery

## 2012-04-17 LAB — CBC
MCHC: 35 g/dL (ref 30.0–36.0)
Platelets: 157 10*3/uL (ref 150–400)
RDW: 16.3 % — ABNORMAL HIGH (ref 11.5–15.5)
WBC: 24.6 10*3/uL — ABNORMAL HIGH (ref 4.0–10.5)

## 2012-04-17 LAB — BASIC METABOLIC PANEL
GFR calc Af Amer: 54 mL/min — ABNORMAL LOW (ref >90)
GFR calc non Af Amer: 47 mL/min — ABNORMAL LOW (ref >90)
Potassium: 3.3 mEq/L — ABNORMAL LOW (ref 3.5–5.1)
Sodium: 127 mEq/L — ABNORMAL LOW (ref 135–145)

## 2012-04-17 LAB — GLUCOSE, CAPILLARY: Glucose-Capillary: 142 mg/dL — ABNORMAL HIGH (ref 70–99)

## 2012-04-17 MED ORDER — POTASSIUM CHLORIDE CRYS ER 20 MEQ PO TBCR
20.0000 meq | EXTENDED_RELEASE_TABLET | Freq: Two times a day (BID) | ORAL | Status: DC
  Administered 2012-04-17 – 2012-04-19 (×5): 20 meq via ORAL
  Filled 2012-04-17 (×7): qty 1

## 2012-04-17 MED ORDER — SODIUM CHLORIDE 0.9 % IV SOLN
INTRAVENOUS | Status: DC
  Administered 2012-04-17: 10:00:00 via INTRAVENOUS

## 2012-04-17 NOTE — Progress Notes (Signed)
Patient ID: [name], male   DOB: 01/10/1931, 78 y.o.   MRN: [id number] PATIENT ID: [name]        MRN:  [id number]          DOB/AGE: 1931-02-21 / 78 y.o.  Norlene Campbell, MD   Jacqualine Code, PA-C [address] [place], [place], [place]  [address]                             [phone]   PROGRESS NOTE  Subjective:  negative for Chest Pain  negative for Shortness of Breath  negative for Nausea/Vomiting   negative for Calf Pain  negative for Bowel Movement   Tolerating Diet: yes         Patient reports pain as mild.       Objective: Vital signs in last 24 hours:   Patient Vitals for the past 24 hrs:  BP Temp Temp src Pulse Resp SpO2 Height Weight  04/17/12 0522 141/67 mmHg 97.7 F (36.5 C) Oral 88  18  99 % - -  04/17/12 0301 114/57 mmHg 98.4 F (36.9 C) Oral 73  16  97 % - -  04/16/12 2048 117/56 mmHg 98.6 F (37 C) Oral 80  16  97 % - -  04/16/12 1500 113/65 mmHg 97.7 F (36.5 C) - 80  16  97 % - -  04/16/12 1115 166/83 mmHg 97.7 F (36.5 C) - 83  20  98 % 5\' 10"  (1.778 m) 83.915 kg (185 lb)  04/16/12 1053 - - - 85  30  100 % - -  04/16/12 1052 - - - 86  34  99 % - -  04/16/12 1051 - - - 82  31  99 % - -  04/16/12 1050 - - - 85  46  99 % - -  04/16/12 1049 - - - 83  24  99 % - -  04/16/12 1048 - - - 84  34  99 % - -  04/16/12 1047 - - - 84  37  99 % - -  04/16/12 1046 - - - 86  38  99 % - -  04/16/12 1045 - 97.3 F (36.3 C) - 87  42  99 % - -  04/16/12 1044 157/81 mmHg - - 86  31  99 % - -  04/16/12 1043 - - - 85  38  98 % - -  04/16/12 1042 - - - 86  36  99 % - -  04/16/12 1041 - - - 84  38  99 % - -  04/16/12 1040 - - - 84  40  99 % - -  04/16/12 1039 - - - 83  38  99 % - -  04/16/12 1038 - - - 86  30  99 % - -  04/16/12 1037 - - - 86  28  99 % - -  04/16/12 1036 - - - 87  22  99 % - -  04/16/12 1035 - - - 87  39  100 % - -  04/16/12 1034 - - - 86  29  99 % - -  04/16/12 1033 - - - 86  35  91 % - -  04/16/12 1032 - - - 86  35  96 % - -    04/16/12 1031 - - - 86  39  98 % - -  04/16/12  1030 - - - 87  24  98 % - -  04/16/12 1029 166/84 mmHg - - 87  23  98 % - -  04/16/12 1028 - - - 87  27  98 % - -  04/16/12 1027 - - - 87  24  98 % - -  04/16/12 1026 - - - 88  29  98 % - -  04/16/12 1025 - - - 87  24  99 % - -  04/16/12 1024 - - - 87  24  98 % - -  04/16/12 1023 - - - 88  24  98 % - -  04/16/12 1022 - - - 88  24  99 % - -  04/16/12 1021 - - - 87  29  98 % - -  04/16/12 1020 - - - 87  19  99 % - -  04/16/12 1019 - - - 86  20  97 % - -  04/16/12 1018 - - - 87  17  98 % - -  04/16/12 1017 - - - 86  20  98 % - -  04/16/12 1016 - - - 86  21  98 % - -  04/16/12 1015 164/79 mmHg - - 86  28  98 % - -  04/16/12 1014 - - - 88  22  98 % - -  04/16/12 1013 - - - 88  30  98 % - -  04/16/12 1012 - - - 88  28  98 % - -  04/16/12 1011 - - - 88  36  98 % - -  04/16/12 1010 - - - 87  25  99 % - -  04/16/12 1009 - - - 87  26  99 % - -  04/16/12 1008 - - - 86  23  99 % - -  04/16/12 1007 - - - 87  23  97 % - -  04/16/12 1006 - - - 86  22  99 % - -  04/16/12 1005 - - - 86  37  98 % - -  04/16/12 1004 - - - 86  23  99 % - -  04/16/12 1003 - - - 86  24  99 % - -  04/16/12 1002 - - - 87  21  99 % - -  04/16/12 1001 - - - 85  27  99 % - -  04/16/12 1000 - - - 84  31  99 % - -  04/16/12 0959 117/100 mmHg - - 83  23  98 % - -  04/16/12 0958 - - - 74  25  97 % - -  04/16/12 0957 - - - 31  23  88 % - -  04/16/12 0956 - - - 49  26  90 % - -  04/16/12 0955 - - - 37  29  92 % - -  04/16/12 0954 - - - 44  22  92 % - -  04/16/12 0953 - - - 77  22  90 % - -  04/16/12 0952 - - - 81  23  95 % - -  04/16/12 0951 - - - 82  19  95 % - -  04/16/12 0950 - - - 80  27  94 % - -  04/16/12 0949 - - - 79  26  87 % - -  04/16/12 0948 - - - - 43  - - -  04/16/12 0947 - - - 80  34  100 % - -  04/16/12 0946 - - - 79  36  100 % - -  04/16/12 0945 - - - 79  37  98 % - -  04/16/12 0944 146/69 mmHg - - 76  34  93 % - -  04/16/12 0940 - 97.5 F (36.4 C) - -  - - - -      Intake/Output from previous day:   08/27 0701 - 08/28 0700 In: 2500 [I.V.:2500] Out: 1840 [Urine:900; Drains:890]   Intake/Output this shift:       Intake/Output      08/27 0701 - 08/28 0700 08/28 0701 - 08/29 0700   I.V. (mL/kg) 2500 (29.8)    Total Intake(mL/kg) 2500 (29.8)    Urine (mL/kg/hr) 900 (0.4)    Drains 890    Blood 50    Total Output 1840    Net +660            LABORATORY DATA:  Basename 04/17/12 0600  WBC 24.6*  HGB 11.4*  HCT 32.6*  PLT 157    Basename 04/17/12 0600  NA 127*  K 3.3*  CL 92*  CO2 26  BUN 23  CREATININE 1.39*  GLUCOSE 152*  CALCIUM 8.5   Lab Results  Component Value Date   INR 1.03 04/09/2012   INR 1.0 ratio 06/11/2009   INR 0.9 02/20/2008    Recent Radiographic Studies :   Chest 2 View  04/09/2012  *RADIOLOGY REPORT*  Clinical Data: Preoperative assessment for left total knee arthroplasty, history coronary artery disease, aortic stenosis, hypertension, GERD  CHEST - 2 VIEW  Comparison: 05/23/2010  Findings: Upper normal heart size post CABG. Mediastinal contours and pulmonary vascularity normal. Lungs clear. No pleural effusion or pneumothorax. Prior cervical spine fusion. Few scattered endplate spurs and thoracic spine.  IMPRESSION: Post CABG. No acute abnormalities.   Original Report Authenticated By: Lollie Marrow, M.D.      Examination:  General appearance: alert, cooperative and no distress  Wound Exam: clean, dry, intact   Drainage:  None: wound tissue dry  Motor Exam: EHL, FHL, Anterior Tibial and Posterior Tibial Intact  Sensory Exam: Superficial Peroneal, Deep Peroneal and Tibial normal  Vascular Exam: Normal  Assessment:    1 Day Post-Op  Procedure(s) (LRB): TOTAL KNEE ARTHROPLASTY (Left)  ADDITIONAL DIAGNOSIS:  Active Problems:  * No active hospital problems. *   Hypokalemia and Hyponatremia   Plan: Physical Therapy as ordered Partial Weight Bearing @ 50% (PWB)  DVT Prophylaxis:   Xarelto  DISCHARGE PLAN: Home  DISCHARGE NEEDS: has equipment at home   hemovac output 50cc in last 7 hours-will D/C. OOB with PT      Valeria Batman 04/17/2012, 7:53 AM

## 2012-04-17 NOTE — Progress Notes (Signed)
Referral received for SNF. Chart reviewed and CSW has spoken with RNCM who indicates that patient is for DC to home with Home Health.  CSW to sign off. Please re-consult if CSW needs arise.  Destani Wamser T. Evo Aderman, LCSWA  [phone]  

## 2012-04-17 NOTE — Progress Notes (Signed)
CARE MANAGEMENT NOTE 04/17/2012  Patient:  [name], [name]   Account Number:  [id number]  Date Initiated:  04/17/2012  Documentation initiated by:  Vance Peper  Subjective/Objective Assessment:   78 yr old male s/p left total knee arthroplasty.     Action/Plan:   CM spoke with patient and wife regarding home health and DME needs at discharge. Choice offered, patient preoperatively setup with [facility], no changes. rolling walker, CPM and 3in1 have been delivered to the home by [employer].   Anticipated DC Date:  04/18/2012   Anticipated DC Plan:  HOME W HOME HEALTH SERVICES      DC Planning Services  CM consult      [facility] Choice  HOME HEALTH   Choice offered to / List presented to:  C-1 Patient        HH arranged  HH-2 PT      [facility] agency  [facility]   Status of service:  Completed, signed off Medicare Important Message given?   (If response is "NO", the following Medicare IM given date fields will be blank) Date Medicare IM given:   Date Additional Medicare IM given:    Discharge Disposition:  HOME W HOME HEALTH SERVICES  Per UR Regulation:    If discussed at Long Length of Stay Meetings, dates discussed:    Comments:  04/17/12 1053 Vance Peper, RN BSN Case Manager Patient will be discharged on Xarelto 10 mg, contacted patient's pharmacy- [facility], spoke with pharmacist Sela Hua, they have Xarelto in stock.

## 2012-04-17 NOTE — Evaluation (Signed)
Physical Therapy Evaluation Patient Details Name: [name] MRN: [id number] DOB: 26-Mar-1931 Today's Date: 04/17/2012 Time: 7829-5621 PT Time Calculation (min): 24 min  PT Assessment / Plan / Recommendation Clinical Impression  Pt is 78 y/o male admitted for s/p left TKA.  Pt moving well and willing to work.  Pt will benefit from acute PT services to improve overall mobility to prepare for safe d/c home with family.    PT Assessment  Patient needs continued PT services    Follow Up Recommendations  Home health PT;Supervision/Assistance - 24 hour    Barriers to Discharge        Equipment Recommendations  None recommended by PT    Recommendations for Other Services     Frequency 7X/week    Precautions / Restrictions Precautions Precautions: Knee Restrictions Weight Bearing Restrictions: Yes LLE Weight Bearing: Partial weight bearing (50%)   Pertinent Vitals/Pain 3/10 left knee pain      Mobility  Bed Mobility Bed Mobility: Sit to Supine Sit to Supine: 4: Min guard Details for Bed Mobility Assistance: Minguard for safety with cues for proper technique Transfers Transfers: Sit to Stand;Stand to Sit Sit to Stand: 4: Min guard;From bed Stand to Sit: 4: Min guard;To chair/3-in-1 Details for Transfer Assistance: Minguard for safety with cues for hand placement and LE placement Ambulation/Gait Ambulation/Gait Assistance: 4: Min guard Ambulation Distance (Feet): 30 Feet Assistive device: Rolling walker Ambulation/Gait Assistance Details: Minguard for safety with cues for proper step sequence and RW placement Gait Pattern: Step-to pattern;Decreased stride length;Antalgic;Trunk flexed    Exercises Total Joint Exercises Ankle Circles/Pumps: AAROM;Both;10 reps Quad Sets: AAROM;Strengthening;Left;5 reps Heel Slides: AAROM;Strengthening;Left;5 reps   PT Diagnosis: Difficulty walking;Abnormality of gait;Acute pain  PT Problem List: Decreased strength;Decreased range of  motion;Decreased activity tolerance;Decreased balance;Decreased knowledge of use of DME;Decreased mobility;Pain PT Treatment Interventions: DME instruction;Gait training;Stair training;Functional mobility training;Therapeutic activities;Therapeutic exercise;Patient/family education   PT Goals Acute Rehab PT Goals PT Goal Formulation: With patient Time For Goal Achievement: 04/24/12 Potential to Achieve Goals: Good Pt will go Supine/Side to Sit: with modified independence PT Goal: Supine/Side to Sit - Progress: Goal set today Pt will go Sit to Supine/Side: with modified independence PT Goal: Sit to Supine/Side - Progress: Goal set today Pt will go Sit to Stand: with modified independence PT Goal: Sit to Stand - Progress: Goal set today Pt will go Stand to Sit: with modified independence PT Goal: Stand to Sit - Progress: Goal set today Pt will Ambulate: >150 feet;with modified independence;with rolling walker PT Goal: Ambulate - Progress: Goal set today Pt will Go Up / Down Stairs: 1-2 stairs;with min assist;with rolling walker PT Goal: Up/Down Stairs - Progress: Goal set today Pt will Perform Home Exercise Program: Independently  Visit Information  Last PT Received On: 04/17/12 Assistance Needed: +1    Subjective Data  Subjective: "I'm ready to get up." Patient Stated Goal: To play golf again   Prior Functioning  Home Living Lives With: Spouse Available Help at Discharge: Family Type of Home: House Home Access: Stairs to enter Secretary/administrator of Steps: 2 Entrance Stairs-Rails: None Home Layout: One level Bathroom Shower/Tub: Walk-in shower;Door [employer] Toilet: Pharmacist, community: Yes How Accessible: Accessible via wheelchair Home Adaptive Equipment: Bedside commode/3-in-1;Walker - rolling Prior Function Level of Independence: Independent Able to Take Stairs?: Yes Driving: Yes Vocation: Retired Musician: No difficulties Dominant  Hand: Right    Cognition  Overall Cognitive Status: Appears within functional limits for tasks assessed/performed Arousal/Alertness: Awake/alert Orientation Level: Appears  intact for tasks assessed Behavior During Session: [facility] for tasks performed    Extremity/Trunk Assessment Right Lower Extremity Assessment RLE ROM/Strength/Tone: Within functional levels Left Lower Extremity Assessment LLE ROM/Strength/Tone: Unable to fully assess;Due to pain;Deficits LLE ROM/Strength/Tone Deficits: Limited knee flexion AAROM 0-50 degrees    Balance    End of Session PT - End of Session Equipment Utilized During Treatment: Gait belt Activity Tolerance: Patient tolerated treatment well Patient left: in chair;with call bell/phone within reach Nurse Communication: Mobility status CPM Left Knee CPM Left Knee: Off Left Knee Flexion (Degrees): 65  Left Knee Extension (Degrees): 0   GP     [name] 04/17/2012, 9:06 AM [name], PT DPT [phone]

## 2012-04-17 NOTE — Op Note (Signed)
[name], [name] NO.:  [id number]  MEDICAL RECORD NO.:  [id number]  LOCATION:  5N01C                        FACILITY:  [facility]  PHYSICIAN:  Claude Manges. Whitfield, M.D.DATE OF BIRTH:  Feb 15, 1931  DATE OF PROCEDURE:  04/16/2012 DATE OF DISCHARGE:                              OPERATIVE REPORT   PREOPERATIVE DIAGNOSIS:  End-stage osteoarthritis, left knee.  POSTOPERATIVE DIAGNOSIS:  End-stage osteoarthritis, left knee.  PROCEDURE:  Left total knee replacement.  SURGEON:  Claude Manges. Cleophas Dunker, MD  ASSISTANT:  Oris Drone. Petrarca, PA-C  ANESTHESIA:  General with supplemental femoral nerve block.  COMPLICATIONS:  None.  COMPONENTS:  DePuy LCS large femoral component at #4 rotating keeled tibial tray, a 10-mm bridging bearing, a metal-back 3-peg rotating patella, all was secured with polymethyl methacrylate.  PROCEDURE:  [name] was met in the holding area, identified his left knee as appropriate operative site, did receive a preoperative femoral nerve block.  The patient was transported to room #7 and placed under general anesthesia without difficulty.  Nursing staff inserted a Foley catheter. Urine was clear.  Tourniquet was then applied to the left thigh and the leg was prepped with chlorhexidine and DuraPrep tourniquet to the midfoot.  Sterile draping was performed.  With the extremity still elevated, was Esmarch exsanguinated with tourniquet at 350 mmHg.  Midline longitudinal incision was made, centered about the patella, extending from the superior pouch to the tibial tubercle via sharp dissection.  Incision was carried down to the subcutaneous tissue. Gross bleeders were Bovie coagulated.  The deep capsule was incised with the Bovie.  The joint was entered.  There was a large deep yellow effusion.  The patella was everted at 180 degrees and knee flexed to 90 degrees. There was a large and abundant synovitis.  Synovectomy was performed. There  were very large osteophytes along the medial, lateral femoral condyle along the medial, tibial plateau.  There was a fixed varus and medial release was performed.  So, I could neutralize the extremity. There was complete absence of articular cartilage medially and to a greater extent laterally in both the femoral condyle and tibial plateau. There were large osteophytes about the patella, which were excised.  First, bony cut was made transversely in the proximal tibia using the external tibial guide with a 7-degree angle of declination.  We measured a large femoral component, and using that jig, femoral cuts were made with 10-mm flexion and extension gaps being perfectly symmetrical. There was a fixed flexure contracture preoperatively approximately 10 degrees.  We had perfectly full extension after our bony cuts.  At each bony cut, I checked the alignment.  A 4-degree distal femoral valgus cut was utilized.  Lamina spreaders were then placed along the medial and lateral compartments to remove medial and lateral menisci, synovitis, and ACL and PCL.  Osteophytes removed from the posterior femoral condyle medially and laterally with a 3/4-inch curved osteotome.  The Baker cyst was decompressed and excised as best I could see with the Bovie.  The final femoral cut was then made for tapering and to apply the two center holes.  Retractors were then placed around the tibia.  We measured a #4  tibial tray.  Any further osteophytes removed from the medial and lateral femoral condyle.  Center cut was filled by the keeled cut.  The 10-mm bridging bearing was then applied to the tibial jig followed by the large femoral component.  We had excellent alignment.  We have no opening with varus or valgus stress with full extension.  The patella was then prepared by removing 12 mm of bone leaving, 14 mm of patella thickness.  Three pegs were then made.  The trial patella was inserted and then reduced  through a full range of motion, it remained perfectly stable.  The trial components were removed.  The joint was copiously irrigated with saline solution.  The final components were then applied with polymethyl methacrylate.  We initially applied the tibia followed by the 10-mm bridging bearing and the femur.  Components were compressed and then the knee was placed in full extension.  Any extraneous methacrylate was removed with a Glorious Peach. The patella was applied with methacrylate and bone clamp.  Waiting for the methacrylate to mature, we injected the deep capsule with 0.25% Marcaine with epinephrine.  We also applied thrombin as the patient has been on Plavix.  At approximately 16 minutes, the methacrylate was hard, the tourniquet was deflated.  Any gross bleeders were Bovie coagulated with a very nice dry field.  Hemovac was inserted. The deep capsule was closed with interrupted #1 Ethibond, superficial capsule was closed with running 0 Vicryl, subcu with 3-0 Monocryl.  Skin was closed with skin clips.  Sterile bulky dressing was applied followed by the patient's support stocking.  The patient tolerated the procedure well without complications.     Claude Manges. Cleophas Dunker, M.D.    PWW/MEDQ  D:  04/16/2012  T:  04/17/2012  Job:  [id number]

## 2012-04-17 NOTE — Progress Notes (Signed)
Physical Therapy Treatment Patient Details Name: [name] MRN: [id number] DOB: 1931-01-23 Today's Date: 04/17/2012 Time: 7829-5621 PT Time Calculation (min): 30 min  PT Assessment / Plan / Recommendation Comments on Treatment Session  Pt able to increase ambulation distance and perform HEP.  Pt given HEP handout.  Plan to practice stairs next sessino.    Follow Up Recommendations  Home health PT;Supervision/Assistance - 24 hour    Barriers to Discharge        Equipment Recommendations  None recommended by PT    Recommendations for Other Services    Frequency 7X/week   Plan Discharge plan remains appropriate;Frequency remains appropriate    Precautions / Restrictions Precautions Precautions: Knee Restrictions Weight Bearing Restrictions: Yes LLE Weight Bearing: Partial weight bearing   Pertinent Vitals/Pain 5/10 left knee pain    Mobility  Bed Mobility Bed Mobility: Sit to Supine Sit to Supine: 4: Min assist Details for Bed Mobility Assistance: (A) to elevate trunk OOB with cues for proper technique Transfers Transfers: Sit to Stand;Stand to Sit Sit to Stand: 4: Min guard;From bed Stand to Sit: 4: Min guard;To chair/3-in-1 Details for Transfer Assistance: Minguard for safety with cues for hand placement and LE placement Ambulation/Gait Ambulation/Gait Assistance: 4: Min guard Ambulation Distance (Feet): 150 Feet Assistive device: Rolling walker Ambulation/Gait Assistance Details: Minguard for safety with cues for upright posture and proper step sequence Gait Pattern: Step-to pattern;Decreased stride length;Antalgic;Trunk flexed    Exercises Total Joint Exercises Ankle Circles/Pumps: AAROM;Both;10 reps Quad Sets: AAROM;Strengthening;Left;10 reps Short Arc Quad: Strengthening;Left;10 reps Heel Slides: Strengthening;Left;10 reps Hip ABduction/ADduction: Strengthening;Left;10 reps Straight Leg Raises: Strengthening;Left;10 reps   PT Diagnosis:    PT Problem  List:   PT Treatment Interventions:     PT Goals Acute Rehab PT Goals PT Goal Formulation: With patient Time For Goal Achievement: 04/24/12 Potential to Achieve Goals: Good Pt will go Supine/Side to Sit: with modified independence PT Goal: Supine/Side to Sit - Progress: Progressing toward goal Pt will go Sit to Supine/Side: with modified independence PT Goal: Sit to Supine/Side - Progress: Progressing toward goal Pt will go Sit to Stand: with modified independence PT Goal: Sit to Stand - Progress: Progressing toward goal Pt will go Stand to Sit: with modified independence PT Goal: Stand to Sit - Progress: Progressing toward goal Pt will Ambulate: >150 feet;with modified independence;with rolling walker PT Goal: Ambulate - Progress: Progressing toward goal Pt will Perform Home Exercise Program: Independently PT Goal: Perform Home Exercise Program - Progress: Progressing toward goal  Visit Information  Last PT Received On: 04/17/12 Assistance Needed: +1    Subjective Data  Subjective: "I'm ready to go again." Patient Stated Goal: To play golf again   Cognition  Overall Cognitive Status: Appears within functional limits for tasks assessed/performed Arousal/Alertness: Awake/alert Orientation Level: Appears intact for tasks assessed Behavior During Session: [facility] for tasks performed    Balance     End of Session PT - End of Session Equipment Utilized During Treatment: Gait belt Activity Tolerance: Patient tolerated treatment well Patient left: in chair;with call bell/phone within reach Nurse Communication: Mobility status   GP     Ryzen Deady 04/17/2012, 3:44 PM Jake Shark, PT DPT [phone]

## 2012-04-18 LAB — BASIC METABOLIC PANEL
CO2: 27 mEq/L (ref 19–32)
CO2: 26 mEq/L (ref 19–32)
Calcium: 8.2 mg/dL — ABNORMAL LOW (ref 8.4–10.5)
Calcium: 8.6 mg/dL (ref 8.4–10.5)
Chloride: 91 mEq/L — ABNORMAL LOW (ref 96–112)
Potassium: 3.6 mEq/L (ref 3.5–5.1)
Sodium: 125 mEq/L — ABNORMAL LOW (ref 135–145)
Sodium: 125 mEq/L — ABNORMAL LOW (ref 135–145)

## 2012-04-18 LAB — PREPARE RBC (CROSSMATCH)

## 2012-04-18 LAB — CBC
HCT: 25.1 % — ABNORMAL LOW (ref 39.0–52.0)
MCH: 31.5 pg (ref 26.0–34.0)
Platelets: 150 10*3/uL (ref 150–400)
Platelets: 125 10*3/uL — ABNORMAL LOW (ref 150–400)
RBC: 2.86 MIL/uL — ABNORMAL LOW (ref 4.22–5.81)
RDW: 16 % — ABNORMAL HIGH (ref 11.5–15.5)
WBC: 16.8 10*3/uL — ABNORMAL HIGH (ref 4.0–10.5)
WBC: 16.6 10*3/uL — ABNORMAL HIGH (ref 4.0–10.5)

## 2012-04-18 MED ORDER — HYDROCODONE-ACETAMINOPHEN 5-325 MG PO TABS: 1.0000 | ORAL_TABLET | ORAL | Status: DC | PRN

## 2012-04-18 MED ORDER — ACETAMINOPHEN 325 MG PO TABS
650.0000 mg | ORAL_TABLET | Freq: Once | ORAL | Status: AC
  Administered 2012-04-18: 650 mg via ORAL
  Filled 2012-04-18: qty 2

## 2012-04-18 MED ORDER — TRAMADOL HCL 50 MG PO TABS: 50.0000 mg | ORAL_TABLET | Freq: Four times a day (QID) | ORAL | Status: DC | PRN

## 2012-04-18 NOTE — Progress Notes (Signed)
Physical Therapy Treatment Patient Details Name: [name] MRN: [id number] DOB: 01/03/1931 Today's Date: 04/18/2012 Time: 2130-8657 PT Time Calculation (min): 31 min  PT Assessment / Plan / Recommendation Comments on Treatment Session  Pt continues to c/o fatigue and MD ordered 1 unit of PRBCs.  Pt Hgb currently 9.0.  Pt left on CPM 0-55 degrees.  Need to practice stairs next session.    Follow Up Recommendations  Home health PT;Supervision/Assistance - 24 hour    Barriers to Discharge        Equipment Recommendations  Tub/shower seat    Recommendations for Other Services    Frequency 7X/week   Plan Discharge plan remains appropriate;Frequency remains appropriate    Precautions / Restrictions Precautions Precautions: Knee Restrictions Weight Bearing Restrictions: Yes LLE Weight Bearing: Partial weight bearing LLE Partial Weight Bearing Percentage or Pounds: 50%   Pertinent Vitals/Pain 7/10 left knee pain    Mobility  Bed Mobility Bed Mobility: Sit to Supine Sit to Supine: 4: Min assist Details for Bed Mobility Assistance: (A) with left LE into bed Transfers Transfers: Sit to Stand;Stand to Sit Sit to Stand: 4: Min guard;From chair/3-in-1 Stand to Sit: 4: Min guard;To chair/3-in-1 Details for Transfer Assistance: Minguard for safety with cues for RW placement  Ambulation/Gait Ambulation/Gait Assistance: 4: Min guard Ambulation Distance (Feet): 100 Feet Assistive device: Rolling walker Ambulation/Gait Assistance Details: Minguard for safety with cues for step sequence to continue step through gait Gait Pattern: Step-to pattern;Step-through pattern;Shuffle;Trunk flexed    Exercises Total Joint Exercises Ankle Circles/Pumps: AAROM;Both;10 reps Quad Sets: AAROM;Strengthening;Left;10 reps Short Arc Quad: Strengthening;Left;10 reps Heel Slides: Strengthening;Left;10 reps   PT Diagnosis:    PT Problem List:   PT Treatment Interventions:     PT Goals Acute  Rehab PT Goals PT Goal Formulation: With patient Time For Goal Achievement: 04/24/12 Potential to Achieve Goals: Good Pt will go Sit to Supine/Side: with modified independence PT Goal: Sit to Supine/Side - Progress: Progressing toward goal Pt will go Sit to Stand: with modified independence PT Goal: Sit to Stand - Progress: Progressing toward goal Pt will go Stand to Sit: with modified independence PT Goal: Stand to Sit - Progress: Progressing toward goal Pt will Ambulate: >150 feet;with modified independence;with rolling walker PT Goal: Ambulate - Progress: Progressing toward goal Pt will Perform Home Exercise Program: Independently PT Goal: Perform Home Exercise Program - Progress: Progressing toward goal  Visit Information  Last PT Received On: 04/18/12 Assistance Needed: +1    Subjective Data  Subjective: "I can't believe how weak I can be."   Cognition  Overall Cognitive Status: Appears within functional limits for tasks assessed/performed Arousal/Alertness: Awake/alert Orientation Level: Appears intact for tasks assessed Behavior During Session: [facility] for tasks performed    Balance     End of Session PT - End of Session Equipment Utilized During Treatment: Gait belt Activity Tolerance: Patient tolerated treatment well Patient left: in bed;in CPM;with call bell/phone within reach Nurse Communication: Mobility status   GP     Hanif Radin 04/18/2012, 3:08 PM Jake Shark, PT DPT [phone]

## 2012-04-18 NOTE — Progress Notes (Signed)
Patient ID: [name], male   DOB: October 02, 1930, 78 y.o.   MRN: [id number] PATIENT ID: [name]        MRN:  [id number]          DOB/AGE: [birth year] / 78 y.o.  Daniel Campbell, MD   Jacqualine Code, PA-C [name] [place], [place], [place]  [address]                             [phone]   PROGRESS NOTE  Subjective:  negative for Chest Pain  negative for Shortness of Breath  negative for Nausea/Vomiting   negative for Calf Pain  negative for Bowel Movement   Tolerating Diet: yes         Patient reports pain as mild.       Objective: Vital signs in last 24 hours:   Patient Vitals for the past 24 hrs:  BP Temp Temp src Pulse Resp SpO2  04/18/12 0927 116/57 mmHg - - 78  - -  04/18/12 0542 92/50 mmHg 97.2 F (36.2 C) - 82  18  99 %  04/17/12 2020 107/53 mmHg 98.2 F (36.8 C) Oral 66  15  96 %  04/17/12 1355 118/51 mmHg 98.1 F (36.7 C) - 76  20  98 %      Intake/Output from previous day:   08/28 0701 - 08/29 0700 In: 1200 [P.O.:1200] Out: 1210 [Urine:1210]   Intake/Output this shift:   08/29 0701 - 08/29 1900 In: -  Out: 200 [Urine:200]   Intake/Output      08/28 0701 - 08/29 0700 08/29 0701 - 08/30 0700   P.O. 1200    I.V. (mL/kg)     Total Intake(mL/kg) 1200 (14.3)    Urine (mL/kg/hr) 1210 (0.6) 200 (0.4)   Drains     Blood     Total Output 1210 200   Net -10 -200        Urine Occurrence 1 x       LABORATORY DATA:  Basename 04/18/12 0700 04/17/12 0600  WBC 16.8* 24.6*  HGB 9.0* 11.4*  HCT 25.8* 32.6*  PLT 150 157    Basename 04/18/12 0700 04/17/12 0600  NA 125* 127*  K 3.6 3.3*  CL 91* 92*  CO2 27 26  BUN 24* 23  CREATININE 1.28 1.39*  GLUCOSE 132* 152*  CALCIUM 8.2* 8.5   Lab Results  Component Value Date   INR 1.03 04/09/2012   INR 1.0 ratio 06/11/2009   INR 0.9 02/20/2008    Recent Radiographic Studies :   Chest 2 View  04/09/2012  *RADIOLOGY REPORT*  Clinical Data: Preoperative assessment for left total knee arthroplasty,  history coronary artery disease, aortic stenosis, hypertension, GERD  CHEST - 2 VIEW  Comparison: 05/23/2010  Findings: Upper normal heart size post CABG. Mediastinal contours and pulmonary vascularity normal. Lungs clear. No pleural effusion or pneumothorax. Prior cervical spine fusion. Few scattered endplate spurs and thoracic spine.  IMPRESSION: Post CABG. No acute abnormalities.   Original Report Authenticated By: Lollie Marrow, M.D.      Examination:  General appearance: alert, fatigued and no distress  Wound Exam: clean, dry, intact   Drainage:  None: wound tissue dry  Motor Exam: EHL, FHL, Anterior Tibial and Posterior Tibial Intact  Sensory Exam: Superficial Peroneal, Deep Peroneal and Tibial normal  Vascular Exam: Normal  Assessment:    2 Days Post-Op  Procedure(s) (LRB): TOTAL KNEE  ARTHROPLASTY (Left)  ADDITIONAL DIAGNOSIS:  Active Problems:  * No active hospital problems. *   Acute Blood Loss Anemia, Hypokalemia and Hyponatremia-has fatigue with exertion   Plan: Physical Therapy as ordered Partial Weight Bearing @ 50% (PWB)  DVT Prophylaxis:  Xarelto and TED hose  DISCHARGE PLAN: Home  DISCHARGE NEEDS: has equipment at home   will transfuse 1 unit blood,continue with KCL supplement, limit fluids,try tramadol for pain,repeat labs in am      [facility], [name] 04/18/2012, 12:45 PM

## 2012-04-18 NOTE — Progress Notes (Signed)
Physical Therapy Treatment Patient Details Name: [name] MRN: [id number] DOB: 05/23/1931 Today's Date: 04/18/2012 Time: 4098-1191 PT Time Calculation (min): 31 min  PT Assessment / Plan / Recommendation Comments on Treatment Session  Pt able to perform stair negotiation and handout given. Pt slightly anxious with ambulation and stairs and c/o overall weakness.  Hgb did decrease to 9.0 from 11.4.  Plan to perform HEP this session.    Follow Up Recommendations  Home health PT;Supervision/Assistance - 24 hour    Barriers to Discharge        Equipment Recommendations  None recommended by PT    Recommendations for Other Services    Frequency 7X/week   Plan Discharge plan remains appropriate;Frequency remains appropriate    Precautions / Restrictions Precautions Precautions: Knee Restrictions Weight Bearing Restrictions: Yes LLE Weight Bearing: Partial weight bearing LLE Partial Weight Bearing Percentage or Pounds: 50%   Pertinent Vitals/Pain 5/10 left knee pain    Mobility  Bed Mobility Bed Mobility: Not assessed Transfers Transfers: Sit to Stand;Stand to Sit Sit to Stand: 4: Min guard;From chair/3-in-1 Stand to Sit: 4: Min guard;To chair/3-in-1 Details for Transfer Assistance: Minguard for safety with cues for hand placement and LE placement Ambulation/Gait Ambulation/Gait Assistance: 4: Min guard Ambulation Distance (Feet): 100 Feet Assistive device: Rolling walker Ambulation/Gait Assistance Details: Limited ambulation due to fatigue.  Noticeable decrease in Hgb to 9 from 11.4.   Gait Pattern: Step-to pattern;Decreased stride length;Antalgic;Trunk flexed;Step-through pattern (able to advance to step through) Stairs: Yes Stairs Assistance: 4: Min Editor, commissioning Details (indicate cue type and reason): (A) to maintain balance with max cues for proper step sequence Stair Management Technique: Backwards;With walker Number of Stairs: 2     Exercises Total  Joint Exercises Quad Sets: AAROM;Strengthening;Left;10 reps   PT Diagnosis:    PT Problem List:   PT Treatment Interventions:     PT Goals Acute Rehab PT Goals PT Goal Formulation: With patient Time For Goal Achievement: 04/24/12 Potential to Achieve Goals: Good Pt will go Sit to Stand: with modified independence PT Goal: Sit to Stand - Progress: Progressing toward goal Pt will go Stand to Sit: with modified independence PT Goal: Stand to Sit - Progress: Progressing toward goal Pt will Ambulate: >150 feet;with modified independence;with rolling walker PT Goal: Ambulate - Progress: Progressing toward goal Pt will Go Up / Down Stairs: 1-2 stairs;with min assist;with rolling walker PT Goal: Up/Down Stairs - Progress: Partly met Pt will Perform Home Exercise Program: Independently PT Goal: Perform Home Exercise Program - Progress: Partly met  Visit Information  Last PT Received On: 04/18/12 Assistance Needed: +1    Subjective Data  Subjective: "I feel weak today." Patient Stated Goal: To play golf again   Cognition  Overall Cognitive Status: Appears within functional limits for tasks assessed/performed Arousal/Alertness: Awake/alert Orientation Level: Appears intact for tasks assessed Behavior During Session: [facility] for tasks performed    Balance     End of Session PT - End of Session Equipment Utilized During Treatment: Gait belt Activity Tolerance: Patient tolerated treatment well Patient left: in chair;with call bell/phone within reach Nurse Communication: Mobility status   GP     Braylea Brancato 04/18/2012, 10:44 AM Jake Shark, PT DPT [phone]

## 2012-04-18 NOTE — Evaluation (Signed)
Occupational Therapy Evaluation **late entry for 04/17/12** Patient Details Name: [name] MRN: [id number] DOB: 08/21/1931 Today's Date: 04/18/2012 Time: 4098-1191 OT Time Calculation (min): 23 min  OT Assessment / Plan / Recommendation Clinical Impression  Pt s/p Lt TKA. Will benefit from skilled OT in the acute setting to maximize I with ADL and ADL mobility prior to d/c.     OT Assessment  Patient needs continued OT Services    Follow Up Recommendations  No OT follow up;Supervision/Assistance - 24 hour    Barriers to Discharge      Equipment Recommendations  None recommended by OT;None recommended by PT    Recommendations for Other Services    Frequency  Min 2X/week    Precautions / Restrictions Precautions Precautions: Knee Restrictions Weight Bearing Restrictions: Yes LLE Weight Bearing: Partial weight bearing LLE Partial Weight Bearing Percentage or Pounds: 50%   Pertinent Vitals/Pain Pt with 5/10 left knee pain- premedicated    ADL  Eating/Feeding: Performed;Independent Where Assessed - Eating/Feeding: Chair Grooming: Simulated;Minimal assistance Where Assessed - Grooming: Supported standing Lower Body Bathing: Simulated;Moderate assistance Where Assessed - Lower Body Bathing: Supported sit to stand Upper Body Dressing: Simulated;Set up;Supervision/safety Where Assessed - Upper Body Dressing: Unsupported sitting Lower Body Dressing: Simulated;Maximal assistance Where Assessed - Lower Body Dressing: Supported sit to stand Toilet Transfer: Simulated;Min guard Statistician Method: Sit to Barista:  (to/from chair with armrests) Toileting - Clothing Manipulation and Hygiene: Simulated;Minimal assistance Where Assessed - Engineer, mining and Hygiene: Standing Tub/Shower Transfer: Paramedic Method: Science writer: Walk in shower (backward  entry) Equipment Used: Gait belt;Rolling walker Transfers/Ambulation Related to ADLs: Pt Min a with RW ambulation throughout room; assist for sequencing and balance ADL Comments: Will benefit from shower tx practice and AE education    OT Diagnosis: Generalized weakness;Acute pain  OT Problem List: Decreased activity tolerance;Impaired balance (sitting and/or standing);Decreased knowledge of use of DME or AE;Decreased knowledge of precautions;Pain OT Treatment Interventions: Self-care/ADL training;DME and/or AE instruction;Therapeutic activities;Patient/family education;Balance training   OT Goals Acute Rehab OT Goals OT Goal Formulation: With patient Time For Goal Achievement: 04/25/12 Potential to Achieve Goals: Good ADL Goals Pt Will Perform Grooming: with modified independence;Standing at sink;Sitting at sink ADL Goal: Grooming - Progress: Goal set today Pt Will Perform Upper Body Dressing: Independently;Sitting, chair;Sitting, bed ADL Goal: Upper Body Dressing - Progress: Goal set today Pt Will Perform Lower Body Dressing: with modified independence;with adaptive equipment;Sit to stand from bed;Sit to stand from chair ADL Goal: Lower Body Dressing - Progress: Goal set today Pt Will Transfer to Toilet: with modified independence;Ambulation;with DME ADL Goal: Toilet Transfer - Progress: Goal set today Pt Will Perform Toileting - Clothing Manipulation: Independently;Standing ADL Goal: Toileting - Clothing Manipulation - Progress: Goal set today Pt Will Perform Tub/Shower Transfer: Shower transfer;with supervision;Ambulation;with DME ADL Goal: Tub/Shower Transfer - Progress: Goal set today  Visit Information  Last OT Received On: 04/17/12 Assistance Needed: +1    Subjective Data  Subjective: this has been very helpful Patient Stated Goal: Return home with wife   Prior Functioning  Vision/Perception  Home Living Lives With: Spouse Available Help at Discharge: Family Type of  Home: House Home Access: Stairs to enter Secretary/administrator of Steps: 2 Entrance Stairs-Rails: None Home Layout: One level Bathroom Shower/Tub: Walk-in shower;Door [employer] Toilet: Standard Bathroom Accessibility: Yes How Accessible: Accessible via wheelchair Home Adaptive Equipment: Bedside commode/3-in-1;Walker - rolling Prior Function Level of Independence: Independent Able to Take Stairs?:  Yes Driving: Yes Vocation: Retired Musician: No difficulties Dominant Hand: Right      Cognition  Overall Cognitive Status: Appears within functional limits for tasks assessed/performed Arousal/Alertness: Awake/alert Orientation Level: Appears intact for tasks assessed Behavior During Session: [facility] for tasks performed    Extremity/Trunk Assessment Right Upper Extremity Assessment RUE ROM/Strength/Tone: WFL for tasks assessed (reports UE fatigue after ambulation) RUE Coordination: WFL - gross/fine motor Left Upper Extremity Assessment LUE ROM/Strength/Tone: WFL for tasks assessed (reports UE fatigue after ambulation) LUE Coordination: WFL - gross/fine motor   Mobility  Shoulder Instructions  Bed Mobility Bed Mobility: Not assessed Transfers Sit to Stand: 4: Min guard;From chair/3-in-1;With armrests Stand to Sit: 4: Min guard;With armrests;To chair/3-in-1 Details for Transfer Assistance: Minguard for safety with cues for hand placement and LE placement       Exercise     Balance     End of Session OT - End of Session Equipment Utilized During Treatment: Gait belt Activity Tolerance: Patient tolerated treatment well Patient left: in chair;with call bell/phone within reach;with family/visitor present Nurse Communication: Mobility status  GO     Aracelys Glade 04/18/2012, 10:40 AM

## 2012-04-18 NOTE — Progress Notes (Signed)
Occupational Therapy Treatment Patient Details Name: [name] MRN: [id number] DOB: [birth year] Today's Date: 04/18/2012 Time: 4098-1191 OT Time Calculation (min): 19 min  OT Assessment / Plan / Recommendation Comments on Treatment Session Pt progressing well with therapy. Next session to focus on LB dressing and standing balance.    Follow Up Recommendations  No OT follow up;Supervision/Assistance - 24 hour    Barriers to Discharge       Equipment Recommendations  Tub/shower seat    Recommendations for Other Services    Frequency     Plan Discharge plan remains appropriate    Precautions / Restrictions Precautions Precautions: Knee Restrictions LLE Weight Bearing: Partial weight bearing LLE Partial Weight Bearing Percentage or Pounds: 50%   Pertinent Vitals/Pain Pt reports 5/10 left knee pain, "especially during flexion" Pt pre-medicated    ADL  Grooming: Performed;Wash/dry hands;Supervision/safety Where Assessed - Grooming: Supported standing (pt leaned on sink for support) Toilet Transfer: Simulated;Min Pension scheme manager Method: Sit to Barista:  (from chair with armrests) Tub/Shower Transfer: Performed;Min guard Tub/Shower Transfer Method: Ambulating (backward entry/forward exit) Psychologist, educational: Walk in shower Equipment Used: Gait belt;Rolling walker Transfers/Ambulation Related to ADLs: Min guard/S with RW ambulation in room- good sequencing ADL Comments: Re-educated pt on shower transfer as pt unable to I'ly recall steps for entry and exit. Pt able to verbalize and demonstrate all steps by end of session. Educated pt and wife on AE- pt interested in reacher and possibly sock aid, but not other pieces in kit. Educated pt and wife on where they can purchase    OT Diagnosis:    OT Problem List:   OT Treatment Interventions:     OT Goals ADL Goals ADL Goal: Grooming - Progress: Progressing toward goals ADL Goal: Lower  Body Dressing - Progress: Progressing toward goals ADL Goal: Toilet Transfer - Progress: Progressing toward goals ADL Goal: Toileting - Clothing Manipulation - Progress: Progressing toward goals ADL Goal: Tub/Shower Transfer - Progress: Progressing toward goals  Visit Information  Last OT Received On: 04/18/12 Assistance Needed: +1    Subjective Data      Prior Functioning       Cognition  Overall Cognitive Status: Appears within functional limits for tasks assessed/performed Arousal/Alertness: Awake/alert Orientation Level: Appears intact for tasks assessed Behavior During Session: [facility] for tasks performed    Mobility  Shoulder Instructions Bed Mobility Bed Mobility: Not assessed Transfers Sit to Stand: 4: Min guard;From chair/3-in-1 Stand to Sit: 4: Min guard;To chair/3-in-1 Details for Transfer Assistance: Cues for LE; min guard for safety       Exercises      Balance     End of Session OT - End of Session Equipment Utilized During Treatment: Gait belt Activity Tolerance: Patient tolerated treatment well Patient left: in chair;with call bell/phone within reach;with family/visitor present Nurse Communication: Mobility status  GO     [name] 04/18/2012, 2:46 PM

## 2012-04-19 DIAGNOSIS — D62 Acute posthemorrhagic anemia: Secondary | ICD-10-CM | POA: Diagnosis not present

## 2012-04-19 DIAGNOSIS — E876 Hypokalemia: Secondary | ICD-10-CM | POA: Diagnosis not present

## 2012-04-19 DIAGNOSIS — M1710 Unilateral primary osteoarthritis, unspecified knee: Secondary | ICD-10-CM | POA: Diagnosis present

## 2012-04-19 DIAGNOSIS — E871 Hypo-osmolality and hyponatremia: Secondary | ICD-10-CM | POA: Diagnosis not present

## 2012-04-19 LAB — BASIC METABOLIC PANEL
CO2: 28 mEq/L (ref 19–32)
Chloride: 91 mEq/L — ABNORMAL LOW (ref 96–112)
Glucose, Bld: 126 mg/dL — ABNORMAL HIGH (ref 70–99)
Potassium: 3.4 mEq/L — ABNORMAL LOW (ref 3.5–5.1)
Sodium: 127 mEq/L — ABNORMAL LOW (ref 135–145)

## 2012-04-19 LAB — TYPE AND SCREEN
ABO/RH(D): O POS
Antibody Screen: NEGATIVE

## 2012-04-19 LAB — CBC
Hemoglobin: 8.5 g/dL — ABNORMAL LOW (ref 13.0–17.0)
MCH: 30.6 pg (ref 26.0–34.0)
RBC: 2.78 MIL/uL — ABNORMAL LOW (ref 4.22–5.81)

## 2012-04-19 LAB — OSMOLALITY: Osmolality: 271 mOsm/kg — ABNORMAL LOW (ref 275–300)

## 2012-04-19 MED ORDER — METHOCARBAMOL 500 MG PO TABS: 500.0000 mg | ORAL_TABLET | Freq: Three times a day (TID) | ORAL | Status: AC | PRN

## 2012-04-19 MED ORDER — TRAMADOL HCL 50 MG PO TABS: 50.0000 mg | ORAL_TABLET | Freq: Four times a day (QID) | ORAL | Status: AC | PRN

## 2012-04-19 MED ORDER — RIVAROXABAN 10 MG PO TABS: 10.0000 mg | ORAL_TABLET | ORAL | Status: DC

## 2012-04-19 MED ORDER — POTASSIUM CHLORIDE CRYS ER 20 MEQ PO TBCR
40.0000 meq | EXTENDED_RELEASE_TABLET | Freq: Once | ORAL | Status: AC
  Administered 2012-04-19: 40 meq via ORAL
  Filled 2012-04-19: qty 2

## 2012-04-19 NOTE — Progress Notes (Signed)
Physical Therapy Treatment Patient Details Name: [name] MRN: [id number] DOB: [birth year] Today's Date: 04/19/2012 Time: 2956-2130 PT Time Calculation (min): 41 min  PT Assessment / Plan / Recommendation Comments on Treatment Session  Pt unable to ambulate further due to fatigue however able to complete step negotiation x 2 with (A) from wife.  Pt will have necessary assistance at home to d/c.  Educated and reviewed getting in/out car as well as HEP in detail.    Follow Up Recommendations  Home health PT;Supervision/Assistance - 24 hour    Barriers to Discharge        Equipment Recommendations  Tub/shower seat    Recommendations for Other Services    Frequency 7X/week   Plan Discharge plan remains appropriate;Frequency remains appropriate    Precautions / Restrictions Precautions Precautions: Knee Restrictions Weight Bearing Restrictions: Yes LLE Weight Bearing: Partial weight bearing   Pertinent Vitals/Pain 7/10 left knee pain    Mobility  Transfers Transfers: Sit to Stand;Stand to Sit Sit to Stand: 4: Min guard;From chair/3-in-1 Stand to Sit: 4: Min guard;To chair/3-in-1 Details for Transfer Assistance: Minguard for safety with cues for RW placement  Ambulation/Gait Ambulation/Gait Assistance: 4: Min guard Ambulation Distance (Feet): 100 Feet Assistive device: Rolling walker Ambulation/Gait Assistance Details: Minguard for safety with cues for step sequence to continue step through gait Gait Pattern: Step-to pattern;Step-through pattern;Shuffle;Trunk flexed Stairs: Yes Stairs Assistance: 4: Min assist Stairs Assistance Details (indicate cue type and reason):   (A) to maintain balance with max cues for proper step sequence  Stair Management Technique: Backwards;With walker Number of Stairs: 2  (x2)    Exercises Total Joint Exercises Ankle Circles/Pumps: AAROM;Both;10 reps Quad Sets: AAROM;Strengthening;Left;10 reps Short Arc Quad: Strengthening;Left;10  reps Heel Slides: Strengthening;Left;10 reps Hip ABduction/ADduction: Strengthening;Left;10 reps Straight Leg Raises: Strengthening;Left;10 reps   PT Diagnosis:    PT Problem List:   PT Treatment Interventions:     PT Goals Acute Rehab PT Goals PT Goal Formulation: With patient Time For Goal Achievement: 04/24/12 Potential to Achieve Goals: Good Pt will go Sit to Supine/Side: with modified independence PT Goal: Sit to Supine/Side - Progress: Progressing toward goal Pt will go Sit to Stand: with modified independence PT Goal: Sit to Stand - Progress: Progressing toward goal Pt will go Stand to Sit: with modified independence PT Goal: Stand to Sit - Progress: Progressing toward goal Pt will Ambulate: >150 feet;with modified independence;with rolling walker PT Goal: Ambulate - Progress: Progressing toward goal Pt will Go Up / Down Stairs: 1-2 stairs;with min assist;with rolling walker PT Goal: Up/Down Stairs - Progress: Met Pt will Perform Home Exercise Program: Independently PT Goal: Perform Home Exercise Program - Progress: Progressing toward goal  Visit Information  Last PT Received On: 04/19/12 Assistance Needed: +1    Subjective Data  Subjective: "I'm suppose to go home today." Patient Stated Goal: To play golf again   Cognition  Overall Cognitive Status: Appears within functional limits for tasks assessed/performed Arousal/Alertness: Awake/alert Orientation Level: Appears intact for tasks assessed Behavior During Session: [facility] for tasks performed    Balance     End of Session PT - End of Session Equipment Utilized During Treatment: Gait belt Activity Tolerance: Patient tolerated treatment well Patient left: in chair;with call bell/phone within reach Nurse Communication: Mobility status   GP     Grabiel Schmutz 04/19/2012, 12:41 PM Jake Shark, PT DPT [phone]

## 2012-04-19 NOTE — Discharge Summary (Signed)
Norlene Campbell, MD   Jacqualine Code, PA-C [address] [place], [place], [place]  [address]                             [phone]  PATIENT ID: [name]        MRN:  [id number]          DOB/AGE: 20-Oct-1930 / 78 y.o.    DISCHARGE SUMMARY  ADMISSION DATE:    04/16/2012 DISCHARGE DATE:   04/19/2012   ADMISSION DIAGNOSIS: osteoarthritis left knee    DISCHARGE DIAGNOSIS:  osteoarthritis left knee    ADDITIONAL DIAGNOSIS: Active Problems:  Hyponatremia  Hypokalemia  Postoperative anemia due to acute blood loss  Past Medical History  Diagnosis Date  . Hyperlipidemia   . Benign prostatic hypertrophy   . Carotid artery disease     Status post left carotid endarterectomy / Dr.   Myra Gianotti   . CAD (coronary artery disease)     DES, SVG to circumflex marginal, October, 2010 ( SVG to PDA and PLA patent , LIMA to LAD patent, EF 60%, mild inferior hypokinesis  . Syncope     Remote, etiology unknown  . Psoriasis     severe; with total skin exfoliation in the past resolved  . Systolic murmur   . Ejection fraction     EF 60% cardiac catheterization October, 2010  . Fluid overload     Mild,prn diuretic  . Nephrolithiasis   . Carpal tunnel syndrome   . Hx of decompressive lumbar laminectomy     Dr.Botero December, 2011  . Hx of CABG   . Aortic stenosis     Mild, echo, August, 2012  . Preop cardiovascular exam     Preop clearance for knee surgery, August, 2013  . HTN (hypertension)     takes meds daily  . Hypothyroidism   . History of kidney stones   . GERD (gastroesophageal reflux disease)     takes daily  . Hx of dizziness   . Arthritis     PROCEDURE:Left TOTAL KNEE ARTHROPLASTY on 04/16/2012  CONSULTS: none     HISTORY: Patient is a 78 y.o. male presented with a history of pain in the left knee for 8 years. Onset of symptoms was gradual starting 8 years ago with gradually worsening course since that time. Prior procedures on the knee are meniscectomy. Patient has  been treated conservatively with over-the-counter NSAIDs and activity modification. Patient currently rates pain in the knee at 6 out of 10 with activity. There is pain at night. present.  They have been previously treated with:  NSAIDS: Ibuprofen, Naprosyn with mild improvement  Knee injection with corticosteroid was performed  Knee injection with visco supplementation was not performed  Medications: Ibuprofen with mild improvement, still taking   HOSPITAL COURSE:  [name] is a 78 y.o. admitted on 04/16/2012 and found to have a diagnosis of osteoarthritis left knee.  After appropriate laboratory studies were obtained  they were taken to the operating room on 04/16/2012 and underwent Left Procedure(s): TOTAL KNEE ARTHROPLASTY.   They were given perioperative antibiotics:  Anti-infectives     Start     Dose/Rate Route Frequency Ordered Stop   04/16/12 1400   ceFAZolin (ANCEF) IVPB 1 g/50 mL premix        1 g 100 mL/hr over 30 Minutes Intravenous Every 6 hours 04/16/12 1116 04/16/12 2013   04/15/12 1446   ceFAZolin (ANCEF)  IVPB 2 g/50 mL premix        2 g 100 mL/hr over 30 Minutes Intravenous 60 min pre-op 04/15/12 1446 04/16/12 0742        .  Tolerated the procedure well.  Placed with a foley intraoperatively.  Given Ofirmev at induction and for 48 hours.    POD #1, allowed out of bed to a chair.  PT for ambulation and exercise program.  Foley D/C'd in morning.  IV saline to [facility].  O2 discontionued. Hemovac pulled. KCL given for hypokalemia.  POD #2, continued PT and ambulation.  Given 1 unit PRBC's with pre transfusion tylenol 650 mg. KCL continued.  Fluid restricted to 1000 ml/day.  Stopped oxycodone and began norco and tramadol as needed.  Marland Kitchen POD #3, continued PT.  Doing well and wants to go home.  Labs slowly improving.  The remainder of the hospital course was dedicated to ambulation and strengthening.   The patient was discharged on 3 Days Post-Op in  Stable  condition.  Blood products given:1 unit  CC PRBC  DIAGNOSTIC STUDIES: Recent vital signs: Patient Vitals for the past 24 hrs:  BP Temp Temp src Pulse Resp SpO2  04/19/12 1002 114/52 mmHg - - - - -  04/19/12 0545 110/56 mmHg 98.2 F (36.8 C) - 100  18  99 %  05/16/12 2232 109/50 mmHg 98.8 F (37.1 C) - 102  18  100 %  05-16-2012 1725 103/53 mmHg 99 F (37.2 C) Oral 106  18  -  05-16-12 1650 99/36 mmHg 99.7 F (37.6 C) Oral 102  18  -  16-May-2012 1550 109/58 mmHg 98.8 F (37.1 C) Oral 99  18  -  May 16, 2012 1525 111/56 mmHg 98.6 F (37 C) Oral 93  18  -  16-May-2012 1445 101/48 mmHg 98.2 F (36.8 C) Oral 92  18  -  05/16/2012 1435 95/47 mmHg 97.4 F (36.3 C) - 96  18  96 %       Recent laboratory studies:  Basename 04/19/12 0535 05/16/2012 2137 May 16, 2012 0700 04/17/12 0600  WBC 15.1* 16.6* 16.8* 24.6*  HGB 8.5* 8.9* 9.0* 11.4*  HCT 24.7* 25.1* 25.8* 32.6*  PLT 131* 125* 150 157    Basename 04/19/12 0535 2012/05/16 2137 2012-05-16 0700 04/17/12 0600  NA 127* 125* 125* 127*  K 3.4* 3.9 3.6 3.3*  CL 91* 91* 91* 92*  CO2 28 26 27 26   BUN 24* 26* 24* 23  CREATININE 1.18 1.21 1.28 1.39*  GLUCOSE 126* 160* 132* 152*  CALCIUM 8.4 8.6 8.2* 8.5   Lab Results  Component Value Date   INR 1.03 04/09/2012   INR 1.0 ratio 06/11/2009   INR 0.9 02/20/2008     Recent Radiographic Studies :   Chest 2 View  04/09/2012  *RADIOLOGY REPORT*  Clinical Data: Preoperative assessment for left total knee arthroplasty, history coronary artery disease, aortic stenosis, hypertension, GERD  CHEST - 2 VIEW  Comparison: 05/23/2010  Findings: Upper normal heart size post CABG. Mediastinal contours and pulmonary vascularity normal. Lungs clear. No pleural effusion or pneumothorax. Prior cervical spine fusion. Few scattered endplate spurs and thoracic spine.  IMPRESSION: Post CABG. No acute abnormalities.   Original Report Authenticated By: Lollie Marrow, M.D.     DISCHARGE INSTRUCTIONS: Discharge Orders    Future  Appointments: Provider: Department: Dept Phone: Center:   06/03/2012 2:00 PM [facility] [phone] [facility]   06/03/2012 3:00 PM Evern Bio, NP [facility] [phone] [facility]  Future Orders Please Complete By Expires   Diet general      Scheduling Instructions:   PLEASE EAT ONE BANANA A DAY.  MAY ADD SALT TO FOOD   Call MD / Call 911      Comments:   If you experience chest pain or shortness of breath, CALL 911 and be transported to the hospital emergency room.  If you develope a fever above 101 F, pus (white drainage) or increased drainage or redness at the wound, or calf pain, call your surgeon's office.   Constipation Prevention      Comments:   Drink plenty of fluids.  Prune juice may be helpful.  You may use a stool softener, such as Colace (over the counter) 100 mg twice a day.  Use MiraLax (over the counter) for constipation as needed.   Increase activity slowly as tolerated      Patient may shower      Comments:   You may shower with dressing on. May shower without a dressing once there is no drainage.  Do not wash over the wound.  If drainage remains, cover wound with plastic wrap and then shower.   Partial weight bearing      Comments:   50 % WEIGHT BEARING AS TAUGHT IN PHYSICAL THERAPY   Driving restrictions      Comments:   No driving for 6 weeks   Lifting restrictions      Comments:   No lifting for 6 weeks   CPM      Comments:   Continuous passive motion machine (CPM):      Use the CPM from 0 to 60 for 8 hours per day.      You may increase by 5-10 per day.  You may break it up into 2 or 3 sessions per day.      Use CPM for 3 weeks or until you are told to stop.   TED hose      Comments:   Use stockings (TED hose) for 3 weeks on operative leg(s).  You may remove them at night for sleeping.   Change dressing      Comments:   Change dressing on Monday, then change the dressing daily with sterile 4 x 4 inch gauze dressing and apply TED hose.   You may clean the incision with alcohol prior to redressing.   Do not put a pillow under the knee. Place it under the heel.         DISCHARGE MEDICATIONS:   Medication List  As of 04/19/2012 11:03 AM   STOP taking these medications         aspirin EC 81 MG tablet      clopidogrel 75 MG tablet      Glucosamine 500 MG Caps      ibuprofen 200 MG tablet         TAKE these medications         allopurinol 300 MG tablet   Commonly known as: ZYLOPRIM   Take 600 mg by mouth daily.      atorvastatin 20 MG tablet   Commonly known as: LIPITOR   Take 20 mg by mouth daily.      chlorthalidone 25 MG tablet   Commonly known as: HYGROTON   Take 25 mg by mouth daily.      HALOG 0.1 % Crea   Generic drug: Halcinonide   Apply 1 application topically daily as needed. For rash  levothyroxine 125 MCG tablet   Commonly known as: SYNTHROID, LEVOTHROID   Take 125 mcg by mouth daily.      methocarbamol 500 MG tablet   Commonly known as: ROBAXIN   Take 1 tablet (500 mg total) by mouth every 8 (eight) hours as needed.      metoprolol succinate 100 MG 24 hr tablet   Commonly known as: TOPROL-XL   Take 150 mg by mouth daily.      multivitamin per tablet   Take 1 tablet by mouth daily.      nitroGLYCERIN 0.4 MG SL tablet   Commonly known as: NITROSTAT   Place 0.4 mg under the tongue every 5 (five) minutes x 3 doses as needed. For chest pain      omeprazole 20 MG capsule   Commonly known as: PRILOSEC   Take 20 mg by mouth daily.      pyridOXINE 100 MG tablet   Commonly known as: VITAMIN B-6   Take 100 mg by mouth daily.      rivaroxaban 10 MG Tabs tablet   Commonly known as: XARELTO   Take 1 tablet (10 mg total) by mouth daily.      Tamsulosin HCl 0.4 MG Caps   Commonly known as: FLOMAX   Take 0.4 mg by mouth daily.      testosterone cypionate 200 MG/ML injection   Commonly known as: DEPOTESTOTERONE CYPIONATE   Inject 70 mg into the muscle every 14 (fourteen) days.       traMADol 50 MG tablet   Commonly known as: ULTRAM   Take 1 tablet (50 mg total) by mouth every 6 (six) hours as needed.      valsartan 320 MG tablet   Commonly known as: DIOVAN   Take 320 mg by mouth daily.            FOLLOW UP VISIT:   Follow-up Information    Follow up with Valeria Batman, MD on 05/01/2012.   Contact information:   [facility]           DISPOSITION:  Home    CONDITION:  Stable   Darlis Wragg 04/19/2012, 11:03 AM

## 2012-04-19 NOTE — Progress Notes (Signed)
Utilization review completed. Catheryne Deford, RN, BSN. 

## 2012-04-19 NOTE — Progress Notes (Signed)
Patient ID: [name], male   DOB: March 14, 1931, 78 y.o.   MRN: [id number] PATIENT ID: Daniel BISPING        MRN:  [id number]          DOB/AGE: 12/31/1930 / 78 y.o.  Norlene Campbell, MD   Jacqualine Code, PA-C [address] [place], [place], [place]  [address]                             [phone]   PROGRESS NOTE  Subjective:  negative for Chest Pain  negative for Shortness of Breath  negative for Nausea/Vomiting   negative for Calf Pain  negative for Bowel Movement   Tolerating Diet: yes         Patient reports pain as mild.       Objective: Vital signs in last 24 hours:   Patient Vitals for the past 24 hrs:  BP Temp Temp src Pulse Resp SpO2  04/19/12 1002 114/52 mmHg - - - - -  04/19/12 0545 110/56 mmHg 98.2 F (36.8 C) - 100  18  99 %  04/18/12 2232 109/50 mmHg 98.8 F (37.1 C) - 102  18  100 %  04/18/12 1725 103/53 mmHg 99 F (37.2 C) Oral 106  18  -  04/18/12 1650 99/36 mmHg 99.7 F (37.6 C) Oral 102  18  -  04/18/12 1550 109/58 mmHg 98.8 F (37.1 C) Oral 99  18  -  04/18/12 1525 111/56 mmHg 98.6 F (37 C) Oral 93  18  -  04/18/12 1445 101/48 mmHg 98.2 F (36.8 C) Oral 92  18  -  04/18/12 1435 95/47 mmHg 97.4 F (36.3 C) - 96  18  96 %      Intake/Output from previous day:   08/29 0701 - 08/30 0700 In: 615 [P.O.:315; I.V.:30] Out: 600 [Urine:600]   Intake/Output this shift:       Intake/Output      08/29 0701 - 08/30 0700 08/30 0701 - 08/31 0700   P.O. 315    I.V. (mL/kg) 30 (0.4)    Blood 270    Total Intake(mL/kg) 615 (7.3)    Urine (mL/kg/hr) 600 (0.3)    Total Output 600    Net +15            LABORATORY DATA:  Basename 04/19/12 0535 04/18/12 2137 04/18/12 0700 04/17/12 0600  WBC 15.1* 16.6* 16.8* 24.6*  HGB 8.5* 8.9* 9.0* 11.4*  HCT 24.7* 25.1* 25.8* 32.6*  PLT 131* 125* 150 157    Basename 04/19/12 0535 04/18/12 2137 04/18/12 0700 04/17/12 0600  NA 127* 125* 125* 127*  K 3.4* 3.9 3.6 3.3*  CL 91* 91* 91* 92*  CO2 28 26 27 26     BUN 24* 26* 24* 23  CREATININE 1.18 1.21 1.28 1.39*  GLUCOSE 126* 160* 132* 152*  CALCIUM 8.4 8.6 8.2* 8.5   Lab Results  Component Value Date   INR 1.03 04/09/2012   INR 1.0 ratio 06/11/2009   INR 0.9 02/20/2008    Recent Radiographic Studies :   Chest 2 View  04/09/2012  *RADIOLOGY REPORT*  Clinical Data: Preoperative assessment for left total knee arthroplasty, history coronary artery disease, aortic stenosis, hypertension, GERD  CHEST - 2 VIEW  Comparison: 05/23/2010  Findings: Upper normal heart size post CABG. Mediastinal contours and pulmonary vascularity normal. Lungs clear. No pleural effusion or pneumothorax. Prior cervical spine fusion. Few scattered endplate  spurs and thoracic spine.  IMPRESSION: Post CABG. No acute abnormalities.   Original Report Authenticated By: Lollie Marrow, M.D.      Examination:  General appearance: alert, cooperative and no distress  Wound Exam: clean, dry, intact   Drainage:  None: wound tissue dry  Motor Exam: EHL, FHL, Anterior Tibial and Posterior Tibial Intact  Sensory Exam: Superficial Peroneal, Deep Peroneal and Tibial normal  Vascular Exam: Normal  Assessment:    3 Days Post-Op  Procedure(s) (LRB): TOTAL KNEE ARTHROPLASTY (Left)  ADDITIONAL DIAGNOSIS:  Active Problems:  * No active hospital problems. *   Acute Blood Loss Anemia,hypokalemia,hyponatremia-less fatigued,had one unit of packed cells  Plan: Physical Therapy as ordered Partial Weight Bearing @ 50% (PWB)  DVT Prophylaxis:  Xarelto  DISCHARGE PLAN: Home  DISCHARGE NEEDS: has equipment   will D/C today, comfortable,tolerating diet, lab OK-see in office 2 weeks, continue xarelto for two weeks, then restart plavix and ASA    Toryn Mcclinton W 04/19/2012, 10:46 AM

## 2012-04-29 ENCOUNTER — Inpatient Hospital Stay (HOSPITAL_COMMUNITY)
Admission: EM | Admit: 2012-04-29 | Discharge: 2012-05-05 | DRG: 645 | Disposition: A | Discharge status: Home / Self Care | Payer: Medicare Other | Attending: Internal Medicine | Admitting: Internal Medicine

## 2012-04-29 ENCOUNTER — Encounter (HOSPITAL_COMMUNITY): Payer: Self-pay | Admitting: *Deleted

## 2012-04-29 DIAGNOSIS — E236 Other disorders of pituitary gland: Principal | ICD-10-CM | POA: Diagnosis present

## 2012-04-29 DIAGNOSIS — D72829 Elevated white blood cell count, unspecified: Secondary | ICD-10-CM | POA: Diagnosis present

## 2012-04-29 DIAGNOSIS — I2510 Atherosclerotic heart disease of native coronary artery without angina pectoris: Secondary | ICD-10-CM | POA: Diagnosis present

## 2012-04-29 DIAGNOSIS — Z7901 Long term (current) use of anticoagulants: Secondary | ICD-10-CM

## 2012-04-29 DIAGNOSIS — D649 Anemia, unspecified: Secondary | ICD-10-CM | POA: Diagnosis present

## 2012-04-29 DIAGNOSIS — Z79899 Other long term (current) drug therapy: Secondary | ICD-10-CM

## 2012-04-29 DIAGNOSIS — E876 Hypokalemia: Secondary | ICD-10-CM | POA: Diagnosis present

## 2012-04-29 DIAGNOSIS — I10 Essential (primary) hypertension: Secondary | ICD-10-CM | POA: Diagnosis present

## 2012-04-29 DIAGNOSIS — M109 Gout, unspecified: Secondary | ICD-10-CM | POA: Diagnosis present

## 2012-04-29 DIAGNOSIS — M1710 Unilateral primary osteoarthritis, unspecified knee: Secondary | ICD-10-CM | POA: Diagnosis present

## 2012-04-29 DIAGNOSIS — R011 Cardiac murmur, unspecified: Secondary | ICD-10-CM | POA: Diagnosis present

## 2012-04-29 DIAGNOSIS — E039 Hypothyroidism, unspecified: Secondary | ICD-10-CM | POA: Diagnosis present

## 2012-04-29 DIAGNOSIS — K5900 Constipation, unspecified: Secondary | ICD-10-CM | POA: Diagnosis present

## 2012-04-29 DIAGNOSIS — L408 Other psoriasis: Secondary | ICD-10-CM | POA: Diagnosis present

## 2012-04-29 DIAGNOSIS — Z96659 Presence of unspecified artificial knee joint: Secondary | ICD-10-CM

## 2012-04-29 DIAGNOSIS — I779 Disorder of arteries and arterioles, unspecified: Secondary | ICD-10-CM | POA: Diagnosis present

## 2012-04-29 DIAGNOSIS — E785 Hyperlipidemia, unspecified: Secondary | ICD-10-CM | POA: Diagnosis present

## 2012-04-29 HISTORY — DX: Myoneural disorder, unspecified: G70.9

## 2012-04-29 LAB — CBC WITH DIFFERENTIAL/PLATELET
Basophils Absolute: 0 10*3/uL (ref 0.0–0.1)
HCT: 27 % — ABNORMAL LOW (ref 39.0–52.0)
Lymphocytes Relative: 3 % — ABNORMAL LOW (ref 12–46)
Monocytes Absolute: 1.6 10*3/uL — ABNORMAL HIGH (ref 0.1–1.0)
Neutro Abs: 17.2 10*3/uL — ABNORMAL HIGH (ref 1.7–7.7)
Neutrophils Relative %: 89 % — ABNORMAL HIGH (ref 43–77)
RDW: 16.7 % — ABNORMAL HIGH (ref 11.5–15.5)
WBC: 19.5 10*3/uL — ABNORMAL HIGH (ref 4.0–10.5)

## 2012-04-29 LAB — BASIC METABOLIC PANEL
CO2: 27 mEq/L (ref 19–32)
Chloride: 74 mEq/L — ABNORMAL LOW (ref 96–112)
Creatinine, Ser: 0.9 mg/dL (ref 0.50–1.35)
GFR calc Af Amer: >90 mL/min (ref >90)
Potassium: 3.4 mEq/L — ABNORMAL LOW (ref 3.5–5.1)
Sodium: 111 mEq/L — CL (ref 135–145)

## 2012-04-29 LAB — URINALYSIS, ROUTINE W REFLEX MICROSCOPIC
Glucose, UA: NEGATIVE mg/dL
Leukocytes, UA: NEGATIVE
pH: 7 (ref 5.0–8.0)

## 2012-04-29 LAB — SODIUM, URINE, RANDOM: Sodium, Ur: 61 mEq/L

## 2012-04-29 MED ORDER — METOPROLOL SUCCINATE ER 50 MG PO TB24
150.0000 mg | ORAL_TABLET | Freq: Every day | ORAL | Status: DC
  Administered 2012-04-30 – 2012-05-04 (×5): 150 mg via ORAL
  Filled 2012-04-29 (×6): qty 3

## 2012-04-29 MED ORDER — ONDANSETRON HCL 4 MG PO TABS: 4.0000 mg | ORAL_TABLET | Freq: Four times a day (QID) | ORAL | Status: DC | PRN

## 2012-04-29 MED ORDER — POTASSIUM CHLORIDE CRYS ER 20 MEQ PO TBCR
20.0000 meq | EXTENDED_RELEASE_TABLET | Freq: Three times a day (TID) | ORAL | Status: AC
  Administered 2012-04-29 – 2012-05-01 (×6): 20 meq via ORAL
  Filled 2012-04-29 (×6): qty 1

## 2012-04-29 MED ORDER — ALUM & MAG HYDROXIDE-SIMETH 200-200-20 MG/5ML PO SUSP: 30.0000 mL | Freq: Four times a day (QID) | ORAL | Status: DC | PRN

## 2012-04-29 MED ORDER — ATORVASTATIN CALCIUM 20 MG PO TABS
20.0000 mg | ORAL_TABLET | Freq: Every day | ORAL | Status: DC
  Administered 2012-04-29 – 2012-05-04 (×6): 20 mg via ORAL
  Filled 2012-04-29 (×6): qty 1

## 2012-04-29 MED ORDER — TAMSULOSIN HCL 0.4 MG PO CAPS: 0.4000 mg | ORAL_CAPSULE | Freq: Every day | ORAL | Status: DC

## 2012-04-29 MED ORDER — IRBESARTAN 300 MG PO TABS
300.0000 mg | ORAL_TABLET | Freq: Every day | ORAL | Status: DC
  Administered 2012-04-30 – 2012-05-04 (×5): 300 mg via ORAL
  Filled 2012-04-29 (×6): qty 1

## 2012-04-29 MED ORDER — VITAMIN B-6 50 MG PO TABS
100.0000 mg | ORAL_TABLET | Freq: Every day | ORAL | Status: DC
  Administered 2012-04-30 – 2012-05-04 (×5): 100 mg via ORAL
  Filled 2012-04-29 (×6): qty 2

## 2012-04-29 MED ORDER — LEVOTHYROXINE SODIUM 25 MCG PO TABS
125.0000 ug | ORAL_TABLET | Freq: Every day | ORAL | Status: DC
  Administered 2012-04-30 – 2012-05-04 (×5): 125 ug via ORAL
  Filled 2012-04-29 (×6): qty 1

## 2012-04-29 MED ORDER — ACETAMINOPHEN 325 MG PO TABS: 650.0000 mg | ORAL_TABLET | Freq: Four times a day (QID) | ORAL | Status: DC | PRN

## 2012-04-29 MED ORDER — TAMSULOSIN HCL 0.4 MG PO CAPS
0.4000 mg | ORAL_CAPSULE | Freq: Every day | ORAL | Status: DC
  Administered 2012-04-30 – 2012-05-04 (×5): 0.4 mg via ORAL
  Filled 2012-04-29 (×7): qty 1

## 2012-04-29 MED ORDER — ALLOPURINOL 300 MG PO TABS
600.0000 mg | ORAL_TABLET | Freq: Every day | ORAL | Status: DC
  Administered 2012-04-30 – 2012-05-04 (×5): 600 mg via ORAL
  Filled 2012-04-29 (×6): qty 2

## 2012-04-29 MED ORDER — SODIUM CHLORIDE 0.9 % IV SOLN
INTRAVENOUS | Status: DC
  Administered 2012-04-29 – 2012-05-03 (×8): via INTRAVENOUS

## 2012-04-29 MED ORDER — PANTOPRAZOLE SODIUM 40 MG PO TBEC
40.0000 mg | DELAYED_RELEASE_TABLET | Freq: Every day | ORAL | Status: DC
  Administered 2012-04-30 – 2012-05-05 (×6): 40 mg via ORAL
  Filled 2012-04-29 (×6): qty 1

## 2012-04-29 MED ORDER — ONDANSETRON HCL 4 MG/2ML IJ SOLN: 4.0000 mg | Freq: Four times a day (QID) | INTRAMUSCULAR | Status: DC | PRN

## 2012-04-29 MED ORDER — ZOLPIDEM TARTRATE 5 MG PO TABS: 5.0000 mg | ORAL_TABLET | Freq: Every evening | ORAL | Status: DC | PRN

## 2012-04-29 MED ORDER — ACETAMINOPHEN 650 MG RE SUPP: 650.0000 mg | Freq: Four times a day (QID) | RECTAL | Status: DC | PRN

## 2012-04-29 MED ORDER — ENOXAPARIN SODIUM 40 MG/0.4ML ~~LOC~~ SOLN: 40.0000 mg | SUBCUTANEOUS | Status: DC

## 2012-04-29 NOTE — ED Notes (Signed)
Patient requested ice pack for his knee per ED tech.   Ice pack given

## 2012-04-29 NOTE — ED Notes (Signed)
Pt presents to ED with c/o weakness. Pt had complete left knee replacement 2 weeks ago and states he has been feeling weak since post-sx. Pt was seen by PCP today and blood was drawn. Pt received phone call this afternoon telling him to be seen in ED for low sodium levels. Pt had stables removed from incision yesterday and was told the site "looked great".

## 2012-04-29 NOTE — ED Notes (Signed)
Weakness for 2 weeks,  Called by MD and told he had low Na.  Had   lt knee replacement  2 weeks ago.  Dry intact dressing on lt knee

## 2012-04-30 LAB — BASIC METABOLIC PANEL
CO2: 25 mEq/L (ref 19–32)
Chloride: 79 mEq/L — ABNORMAL LOW (ref 96–112)
Glucose, Bld: 107 mg/dL — ABNORMAL HIGH (ref 70–99)
Potassium: 3.2 mEq/L — ABNORMAL LOW (ref 3.5–5.1)
Sodium: 114 mEq/L — CL (ref 135–145)

## 2012-04-30 MED ORDER — CLOPIDOGREL BISULFATE 75 MG PO TABS
75.0000 mg | ORAL_TABLET | Freq: Every day | ORAL | Status: DC
  Administered 2012-04-30 – 2012-05-04 (×5): 75 mg via ORAL
  Filled 2012-04-30 (×6): qty 1

## 2012-04-30 MED ORDER — ASPIRIN 81 MG PO CHEW
81.0000 mg | CHEWABLE_TABLET | Freq: Every day | ORAL | Status: DC
  Administered 2012-04-30 – 2012-05-04 (×5): 81 mg via ORAL
  Filled 2012-04-30 (×6): qty 1

## 2012-04-30 MED ORDER — ENOXAPARIN SODIUM 40 MG/0.4ML ~~LOC~~ SOLN
40.0000 mg | SUBCUTANEOUS | Status: DC
  Administered 2012-04-30 – 2012-05-04 (×5): 40 mg via SUBCUTANEOUS
  Filled 2012-04-30 (×6): qty 0.4

## 2012-04-30 NOTE — H&P (Signed)
[name], [name]               ACCOUNT NO.:  [id number]  MEDICAL RECORD NO.:  [id number]  LOCATION:  A326                          FACILITY:  [facility]  PHYSICIAN:  Kingsley Callander. [name], MD       DATE OF BIRTH:  1931-07-02  DATE OF ADMISSION:  04/29/2012 DATE OF DISCHARGE:  LH                             HISTORY & PHYSICAL   CHIEF COMPLAINT:  Low sodium.  HISTORY OF PRESENT ILLNESS:  This patient is a 78 year old white male who presented to the hospital with a low sodium level.  The patient had recently undergone a left knee replacement on August 27 by Dr. Cleophas Dunker.  He had followup with Dr. Cleophas Dunker on the day of admission and complained of feeling weak.  Laboratory studies were obtained.  His serum sodium level was found to be 115.  He presented to the emergency room where his sodium level was 111.  He had some mild nausea but had not vomited.  He had not had diarrhea.  During his hospitalization at [facility], it appears his sodium levels had ranged in the mid 120s.  He had a preoperative sodium level at an office visit in early August at which time his sodium level was 140.  He has been on chlorthalidone for hypertension and has used Lasix for edema.  He has had a diminished appetite.  He has felt generally weak.  He has not experienced syncope. Does not feel that he has been thinking as clearly.  PAST MEDICAL HISTORY:  Coronary artery disease, hypertension, hyperlipidemia, hypothyroidism, gout, osteoarthritis, psoriasis, carotid artery disease, status post left carotid endarterectomy.  MEDICATIONS: 1. Xarelto 10 mg daily. 2. He has recently been holding Plavix and aspirin. 3. Vitamin B6 50 mg daily. 4. Prilosec 20 mg daily. 5. Synthroid 125 mcg daily. 6. Allopurinol 600 mg daily. 7. Tramadol 50 mg t.i.d. p.r.n. 8. Diovan 320 mg daily. 9. Lipitor 20 mg daily. 10.Chlorthalidone 25 mg daily. 11.Lasix 40 mg daily as needed. 12.Toprol-XL 150 mg daily.  ALLERGIES:  PROPYLENE  GLYCOL.  SOCIAL HISTORY:  He does not smoke, drink or use drugs.  FAMILY HISTORY:  Remarkable for prostate cancer, heart disease and diabetes.  REVIEW OF SYSTEMS:  He has had no syncope, angina, fever, signs of left knee infection or difficulty breathing.  PHYSICAL EXAMINATION:  VITAL SIGNS:  Temperature 98.6, pulse 79, blood pressure 136/62. GENERAL:  Alert and in no distress. HEENT:  Eyes, nose, and oropharynx are unremarkable.  He does not appear dehydrated. NECK:  Bilateral carotid bruits with no thyromegaly or JVD. LUNGS:  Clear. HEART:  Regular with a grade 2 systolic murmur. ABDOMEN:  Soft and nontender with no hepatosplenomegaly. BACK:  No CVA tenderness. EXTREMITIES:  His left knee has Steri-Strips in place.  His staples were removed by his orthopedist on the day of admission.  There are no signs of infection.  There is no calf tenderness or swelling.  He has no significant edema at present, but has recently used Lasix for edema. NEURO:  No focal weakness. LYMPH NODES:  No cervical or supraclavicular enlargement. SKIN:  Warm and dry.  LABORATORY DATA:  Sodium 111, potassium 3.4, chloride 74, bicarb 27,  BUN 20, creatinine 0.9, glucose 126, white count 19.5, hemoglobin 9.7, platelets 446,000, 89 segs, 3 lymphs.  IMPRESSION/PLAN: 1. Hyponatremia.  He has had a significant drop in his serum sodium     level since early August.  He has had drop from the mid 120s to 111     since his hospital discharge.  He will be treated with IV normal     saline.  Chlorthalidone and Lasix will be stopped. 2. Hypokalemia.  We will replace with supplemental potassium. 3. Osteoarthritis status post left knee replacement.  His left knee     appears to be improving nicely with no signs of infection.  The     etiology of his leukocytosis is unclear. 4. Postoperative anemia.  He did receive 1 transfusion while     hospitalized at [facility].  His hemoglobin is 9.7 now. 5. Anticoagulation.  He has  been on Xarelto and Plavix and aspirin     have been held. 6. Coronary artery disease, stable. 7. Hypertension stable. 8. Carotid artery disease, stable. 9. Gout.  Continue allopurinol. His uric acid was 3.3 on August 7. 1. Hyperlipidemia.  Continue Lipitor. His cholesterol was 106 with an LDL of 62, and HDL of 31 on August 7. 1. Hypothyroidism stable.  His TSH was 2.5 on August 7.     Kingsley Callander. [name], MD     ROF/MEDQ  D:  04/30/2012  T:  04/30/2012  Job:  [id number]  cc:   Claude Manges. Cleophas Dunker, M.D. Fax: [phone]

## 2012-04-30 NOTE — Progress Notes (Signed)
NAMEKEI, MCELHINEY               ACCOUNT NO.:  [id number]  MEDICAL RECORD NO.:  [id number]  LOCATION:  A326                          FACILITY:  [facility]  PHYSICIAN:  Kingsley Callander. [name], MD       DATE OF BIRTH:  01-30-1931  DATE OF PROCEDURE:  04/30/2012 DATE OF DISCHARGE:                                PROGRESS NOTE   Mr. Palardy was admitted last night for hyponatremia with a serum sodium level of 111.  He was treated with normal saline.  He is feeling a little better this morning.  He has not had any vomiting or diarrhea. He is not experiencing significant pain with his left knee replacement.  PHYSICAL EXAMINATION:  VITAL SIGNS:  He is afebrile with a temperature of 97.7 with a pulse of 60 and blood pressure of 118/63. GENERAL:  He appears comfortable. LUNGS:  Clear. HEART:  Regular with a grade 2 systolic murmur. ABDOMEN:  Soft and nontender. EXTREMITIES:  No edema.  His left knee reveals no evidence of infection. He has Steri-Strips in place.  Staples were removed by his orthopedist yesterday.  IMPRESSION/PLAN: 1. Hyponatremia, mildly improved.  Serum sodium is improved from 111-     114 overnight.  We will continue with normal saline.  We will     continue holding chlorthalidone and Lasix. 2. Hypokalemia.  Serum potassium has dropped from 3.4 to 3.2.  We will     continue oral supplementation. 3. Anemia.  Hemoglobin is 9.7.  He received a transfusion while     hospitalized at [facility]  He is not showing signs of bleeding. 4. Leukocytosis 19.5.  Urinalysis shows no sign of infection.  He has     no other symptoms to suggest infection and clearly shows no sign of     wound infection at this point.  We will recheck tomorrow. 5. Anticoagulation.  He has been on Xarelto which has been completed.     He will receive DVT prophylaxis with Lovenox.  Plavix and aspirin     will be restarted.     Kingsley Callander. [name], MD     ROF/MEDQ  D:  04/30/2012  T:  04/30/2012  Job:  [id number]

## 2012-05-01 LAB — BASIC METABOLIC PANEL
CO2: 25 mEq/L (ref 19–32)
Calcium: 8.3 mg/dL — ABNORMAL LOW (ref 8.4–10.5)
Calcium: 8.2 mg/dL — ABNORMAL LOW (ref 8.4–10.5)
Chloride: 85 mEq/L — ABNORMAL LOW (ref 96–112)
Creatinine, Ser: 0.83 mg/dL (ref 0.50–1.35)
GFR calc Af Amer: >90 mL/min (ref >90)
GFR calc Af Amer: >90 mL/min (ref >90)
GFR calc Af Amer: >90 mL/min (ref >90)
GFR calc non Af Amer: 82 mL/min — ABNORMAL LOW (ref >90)
GFR calc non Af Amer: 82 mL/min — ABNORMAL LOW (ref >90)
Glucose, Bld: 125 mg/dL — ABNORMAL HIGH (ref 70–99)
Potassium: 4.1 mEq/L (ref 3.5–5.1)
Potassium: 4 mEq/L (ref 3.5–5.1)
Sodium: 116 mEq/L — CL (ref 135–145)
Sodium: 117 mEq/L — CL (ref 135–145)

## 2012-05-01 LAB — CBC
HCT: 26.9 % — ABNORMAL LOW (ref 39.0–52.0)
Hemoglobin: 9.3 g/dL — ABNORMAL LOW (ref 13.0–17.0)
RDW: 16.9 % — ABNORMAL HIGH (ref 11.5–15.5)
WBC: 13.5 10*3/uL — ABNORMAL HIGH (ref 4.0–10.5)

## 2012-05-01 LAB — OSMOLALITY: Osmolality: 245 mOsm/kg — CL (ref 275–300)

## 2012-05-01 LAB — OSMOLALITY, URINE: Osmolality, Ur: 376 mOsm/kg — ABNORMAL LOW (ref 390–1090)

## 2012-05-01 NOTE — Consult Note (Signed)
[name], [name]               ACCOUNT NO.:  [id number]  MEDICAL RECORD NO.:  [id number]  LOCATION:  A326                          FACILITY:  [facility]  PHYSICIAN:  Purcell Nails, MD DATE OF BIRTH:  [birth year]  DATE OF CONSULTATION:  05/01/2012 DATE OF DISCHARGE:                                CONSULTATION   REASON FOR CONSULT:  Hyponatremia.  HISTORY OF PRESENT ILLNESS:  This is a 78 year old gentleman with medical history significant for  coronary artery disease, hypertension, hyperlipidemia, hypothyroidism, osteoarthritis, psoriasis, gout, recent left knee replacement.  He is currently in hospital at [facility] with a diagnosis of hyponatremia.  The patient was on his postop evaluation in his surgeon's office, Dr. Cleophas Dunker on April 29, 2012, where he complained of weakness.  His immediate blood work showed a sodium of 115 because of which he was sent to [facility] Emergency Room, where his sodium was found to be 111. He was subsequently hospitalized for IV hydration and his sodium is slowly increasing to 116 this morning.  During his hospitalization, his sodium level was seen to be in the mid 120s.  Prior to surgery his electrolytes were normal.  The patient denies any knowledge of lung neoplasm.  He has no recent head injury.  No recent intrathoracic infections.  He is known to use intermittent diuretics including Lasix and recently chlorthalidone as well.  He mentions general poor appetite which affected his intake of oral fluid as well as oral nutrition the patient after his surgery.  He denies any seizure disorder.  However he had spells of confusion and disorientation prior to hospitalization.  PAST MEDICAL HISTORY:  As above.  PAST SURGICAL HISTORY:  Left knee replacement.  HOME MEDICATIONS:  Include Xarelto, recent use of Plavix and aspirin, vitamin B12, Prilosec 20 mg daily, Synthroid 125 mcg daily, allopurinol 600 mg daily, tramadol 50 mg t.i.d., Diovan 320  mg daily, Lipitor 20 mg daily, chlorthalidone 25 mg daily, Lasix 40 mg daily, Toprol-XL 150 mg daily.  ALLERGIES:  Allergic to propylene glycol.  SOCIAL HISTORY:  Negative for alcohol, drug or cigarette smoking.  FAMILY HISTORY:  Remarkable for prostate cancer, coronary artery disease and diabetes.  REVIEW OF SYSTEMS:  As in HPI.  Denies any chest pain.  No bleeding. The rest of the systems have been reviewed and negative.  PHYSICAL EXAMINATION: GENERAL:  He is alert and oriented x3.  He is accompanied by his wife in hospital room.  Blood pressure is stable at 116/57, pulse rate 80, temperature 98.3, respiratory rate is 18. HEENT:  Shows no pallor, no icterus.  No JVD.  No thyromegaly.  He has moist mucous membranes. CHEST:  Clear to auscultation bilaterally. CARDIOVASCULAR:  Normal S1 and S2.  No murmur.  No gallop.  ABDOMEN: Soft and nontender.  No organomegaly.  EXTREMITIES:  Left knee dressed and no peripheral edema. CNS:  Nonfocal. SKIN:  Has no rash.  No hyperemia.  LABORATORY DATA:  His most recent workup is showing sodium 116, potassium 4.1, chloride 85, bicarb 25, BUN 12, creatinine 0.83, calcium 8.5.  Serum osmolarity is very low at 245.  Urine osmolality 376.  Urine sodium is 61.  His hemoglobin 9.3.  ASSESSMENT: 1. Hyponatremia. 2. Hypokalemia which has resolved. 3. Osteoarthritis status post left knee replacement. 4. Postop anemia. 5. Coronary artery disease.  DISCUSSION:  The patient's hyponatremia appears to be multifactorial mainly driven by likely decrease in his effective circulating volume pertaining to his recent knee surgery.  This is evident from the fact that he required blood transfusion during his surgeries.  This potentially draw reduction in the tissue perfusion which will trigger inappropriate secretion of ADH with retention of free water to make the hyponatremia worse.  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  Treatment should include free water restriction along with cautious administration of IV normal saline to slowly raise serum sodium to low normal limits.  Caution should be exercised not to over correct into normal range acutely for the risk of CNS osmotic demyelination.  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  Clinical measurement of ADH will not be helpful hence the diagnosis is entirely clinical and by exclusion.  Suspicion for underlying malignancy contributing ectopic ADH is very low at this point.  I will, however, proceed to obtain thyroid function test, and adrenal evaluation to rule out other secondary treatable causes of hyponatremia.  He will need close monitoring with sodium measurement every 4 hours, and strict measurement of input and output of fluid by orally and IV routes, the choice of IV fluid with normal saline is appropriate.  However, I will decrease his fluid flow rate to 100 mL/h, and restrict his free water intake  to below 400 mL in 24 hours.  Dear Dr. [name], thank you for the opportunity to participate in the care of this pleasant patient.  I will update you in his progress.          ______________________________ Purcell Nails, MD     GN/MEDQ  D:  05/01/2012  T:  05/01/2012  Job:  [id number]

## 2012-05-01 NOTE — Consult Note (Signed)
  [id number] 

## 2012-05-01 NOTE — Care Management Note (Unsigned)
    Page 1 of 1   05/01/2012     4:40:36 PM   CARE MANAGEMENT NOTE 05/01/2012  Patient:  [name], [name]   Account Number:  [id number]  Date Initiated:  05/01/2012  Documentation initiated by:  Rosemary Holms  Subjective/Objective Assessment:   Pt admitted from home where he lives with spouse. Pt admitted with Hyponatremia. Currently has HH PT and RN with [facility].     Action/Plan:   Wife states she has called and informed [facility] that pt is in the hospital. Will resume HH at DC   Anticipated DC Date:  05/03/2012   Anticipated DC Plan:  HOME W HOME HEALTH SERVICES      DC Planning Services  CM consult      Choice offered to / List presented to:          [facility] arranged  HH-1 RN  HH-2 PT      [facility] agency  [facility]   Status of service:  In process, will continue to follow Medicare Important Message given?   (If response is "NO", the following Medicare IM given date fields will be blank) Date Medicare IM given:   Date Additional Medicare IM given:    Discharge Disposition:    Per UR Regulation:    If discussed at Long Length of Stay Meetings, dates discussed:    Comments:  05/01/12 Rosemary Holms RN BSN CM

## 2012-05-01 NOTE — Progress Notes (Signed)
[name], [name]               ACCOUNT NO.:  [id number]  MEDICAL RECORD NO.:  [id number]  LOCATION:  A326                          FACILITY:  [facility]  PHYSICIAN:  Kingsley Callander. [name], MD       DATE OF BIRTH:  17-Jan-1931  DATE OF PROCEDURE:  05/01/2012 DATE OF DISCHARGE:                                PROGRESS NOTE   He is feeling stronger.  He slept poorly last night.  He is having a good urine output.  He is eating fairly well.  PHYSICAL EXAMINATION:  VITAL SIGNS:  Normal.  Temperature is 97 with a pulse of 77 and blood pressure of 136/59. LUNGS:  Clear. HEART:  Regular with a grade 2 systolic murmur. ABDOMEN:  Soft, nontender. EXTREMITIES:  His left knee replacement is partially dressed and appears to be healing well.  IMPRESSION/PLAN: 1. Hyponatremia, mildly improved.  Sodium level was up to only 116.     We will check serum and urine osmolalities and we will consult     Endocrinology.  Continue saline. 2. Hypokalemia.  Normalized from 3.2 to 4.1 with supplementation. 3. Status post left knee replacement.  Consult Physical therapy. 4. Leukocytosis improved.  His white count is dropped from 19.5 to     13.5.  He has had a persistent leukocytosis since his knee     replacement, but is clearly trending in a favorable manner.  He has     had no fever and no definite signs of infection.     Kingsley Callander. [name], MD     ROF/MEDQ  D:  05/01/2012  T:  05/01/2012  Job:  [id number]

## 2012-05-01 NOTE — Plan of Care (Signed)
Problem: Phase II Progression Outcomes Goal: Progress activity as tolerated unless otherwise ordered Outcome: Progressing PT/OT eval  pt up with assist, up to chair with minimal assist Goal: Discharge plan established Outcome: Adequate for Discharge Plan to d/c home with wife and home health

## 2012-05-01 NOTE — Evaluation (Signed)
Physical Therapy Evaluation Patient Details Name: [name] MRN: [id number] DOB: [birth year] Today's Date: 05/01/2012 Time: 4403-4742 PT Time Calculation (min): 34 min  PT Assessment / Plan / Recommendation Clinical Impression  Pt is s/p L TKR on 04-16-12.  He has been receiving HHPT for rehab.  We are seeing him today to continue TKR protocol.  He is still feeling weak from medical issues and does not want to try walking, so we focused on exercise only.  His knee flexion is limited to 70 deg., so stretching was done.  His tolerance is fairly low, so we were not able to gain much headway.  We will try to progress to gait tomorrow.             PT Assessment  Patient needs continued PT services    Follow Up Recommendations  Home health PT    Barriers to Discharge None      Equipment Recommendations  None recommended by PT    Recommendations for Other Services     Frequency Min 3X/week    Precautions / Restrictions Precautions Precautions: Knee Restrictions Weight Bearing Restrictions: No   Pertinent Vitals/Pain       Mobility       Exercises Total Joint Exercises Ankle Circles/Pumps: AROM;Left;10 reps;Seated Quad Sets: AROM;10 reps;Left;Seated Short Arc Quad: AROM;Left;10 reps;Seated Straight Leg Raises: AROM;Left;10 reps;Seated Long Arc Quad: AROM;Left;10 reps;Seated Knee Flexion: AROM;AAROM;Left;10 reps;Seated Goniometric ROM: 0-70 deg   PT Diagnosis: Difficulty walking  PT Problem List: Decreased range of motion;Decreased activity tolerance;Decreased mobility PT Treatment Interventions: Gait training;Therapeutic exercise   PT Goals Acute Rehab PT Goals PT Goal Formulation: With patient Time For Goal Achievement: 05/08/12 Potential to Achieve Goals: Good PT Goal: Supine/Side to Sit - Progress: Discontinued (comment) PT Goal: Sit to Supine/Side - Progress: Discontinued (comment) PT Goal: Sit to Stand - Progress: Goal set today Pt will go Stand to Sit: with  modified independence PT Goal: Stand to Sit - Progress: Goal set today Pt will Ambulate: 51 - 150 feet;with modified independence;with rolling walker PT Goal: Ambulate - Progress: Goal set today PT Goal: Up/Down Stairs - Progress: Discontinued (comment) PT Goal: Perform Home Exercise Program - Progress: Discontinued (comment)  Visit Information  Last PT Received On: 05/01/12    Subjective Data  Subjective: Hasn't done any knee exercise since admission..."I don't think the doctor wants me to do any walking today"   Prior Functioning       Cognition  Overall Cognitive Status: Appears within functional limits for tasks assessed/performed Arousal/Alertness: Awake/alert Orientation Level: Appears intact for tasks assessed Behavior During Session: [facility] for tasks performed    Extremity/Trunk Assessment Right Lower Extremity Assessment RLE ROM/Strength/Tone: [facility] for tasks assessed Left Lower Extremity Assessment LLE ROM/Strength/Tone: [facility] for tasks assessed LLE ROM/Strength/Tone Deficits: ROM L knee is 0-70 deg, AA   Balance    End of Session PT - End of Session Equipment Utilized During Treatment: Gait belt Activity Tolerance: Patient tolerated treatment well Patient left: in chair;with call bell/phone within reach;with family/visitor present  GP     Myrlene Broker L 05/01/2012, 1:46 PM

## 2012-05-02 LAB — BASIC METABOLIC PANEL
BUN: 11 mg/dL (ref 6–23)
CO2: 23 mEq/L (ref 19–32)
Calcium: 8.3 mg/dL — ABNORMAL LOW (ref 8.4–10.5)
Chloride: 88 mEq/L — ABNORMAL LOW (ref 96–112)
Chloride: 89 mEq/L — ABNORMAL LOW (ref 96–112)
Creatinine, Ser: 0.89 mg/dL (ref 0.50–1.35)
GFR calc Af Amer: >90 mL/min (ref >90)
GFR calc Af Amer: >90 mL/min (ref >90)
GFR calc Af Amer: >90 mL/min (ref >90)
GFR calc non Af Amer: 80 mL/min — ABNORMAL LOW (ref >90)
GFR calc non Af Amer: 79 mL/min — ABNORMAL LOW (ref >90)
Potassium: 4.5 mEq/L (ref 3.5–5.1)
Potassium: 3.9 mEq/L (ref 3.5–5.1)
Sodium: 119 mEq/L — CL (ref 135–145)
Sodium: 119 mEq/L — CL (ref 135–145)

## 2012-05-02 LAB — CORTISOL-AM, BLOOD: Cortisol - AM: 15.1 ug/dL (ref 4.3–22.4)

## 2012-05-02 LAB — T4, FREE: Free T4: 1.22 ng/dL (ref 0.80–1.80)

## 2012-05-02 NOTE — Progress Notes (Signed)
Physical Therapy Treatment Patient Details Name: [name] MRN: [id number] DOB: [birth year] Today's Date: 05/02/2012 Time: 4098-1191 PT Time Calculation (min): 32 min  PT Assessment / Plan / Recommendation Comments on Treatment Session  Pt highly motivated for L knee strengthening and to improve gait mechanics.  Pt able to ambulate 210' with RW supervision with vc-ing to increase stride length, heel to toe gait and to increase knee flexion.    Follow Up Recommendations       Barriers to Discharge        Equipment Recommendations       Recommendations for Other Services    Frequency     Plan      Precautions / Restrictions Precautions Precautions: Knee Restrictions Weight Bearing Restrictions: No LLE Weight Bearing: Weight bearing as tolerated       Mobility  Transfers Transfers: Sit to Stand;Stand to Sit Sit to Stand: 5: Supervision Stand to Sit: 5: Supervision Details for Transfer Assistance: Supervision with vc-ing for safe hand placement Ambulation/Gait Ambulation/Gait Assistance: 5: Supervision Ambulation Distance (Feet): 210 Feet Assistive device: Rolling walker Ambulation/Gait Assistance Details: Supervision with RW vc-ing for heel to toe and knee flexion.  Pt with good gait speed but did require vc-ing to increase stride length Gait Pattern: Step-through pattern;Decreased stride length Stairs: No    Exercises Total Joint Exercises Quad Sets: AROM;Left;10 reps;Seated Short Arc Quad: AROM;Left;10 reps;Seated Heel Slides: AROM;Left;10 reps;Seated Straight Leg Raises: AROM;Left;10 reps;Seated Long Arc Quad: AROM;Left;10 reps;Seated Knee Flexion: AROM;Left;10 reps;Standing   PT Diagnosis:    PT Problem List:   PT Treatment Interventions:     PT Goals Acute Rehab PT Goals PT Goal: Sit to Stand - Progress: Met PT Goal: Stand to Sit - Progress: Met PT Goal: Ambulate - Progress: Met PT Goal: Up/Down Stairs - Progress: Not met  Visit Information  Last  PT Received On: 05/02/12    Subjective Data  Subjective: Pt highly motivated for therex and gait today, L knee pain not bad maybe a 2-3/10.   Cognition  Overall Cognitive Status: Appears within functional limits for tasks assessed/performed Arousal/Alertness: Awake/alert Orientation Level: Appears intact for tasks assessed Behavior During Session: [facility] for tasks performed    Balance     End of Session PT - End of Session Equipment Utilized During Treatment: Gait belt Activity Tolerance: Patient tolerated treatment well Patient left: in chair;with family/visitor present;with call bell/phone within reach Nurse Communication: Mobility status   GP     Juel Burrow 05/02/2012, 9:17 AM

## 2012-05-02 NOTE — Progress Notes (Signed)
CRITICAL VALUE ALERT  Critical value received:  Sodium 119  Date of notification:  05/02/12  Time of notification:  1715  Critical value read back:yes  Nurse who received alert:  E.Modesta Sammons RN  MD notified (1st page):    Time of first page:    MD notified (2nd page):  Time of second page:  Responding MD:   Time MD responded:   Critical Value has been the same since previous reading. MD not paged. Patient on 432ml/free water restriction & iV fluid maintence.

## 2012-05-02 NOTE — Progress Notes (Signed)
CRITICAL VALUE ALERT  Critical value received:  Sodium=119  Date of notification:  05/02/12  Time of notification:  0102  Critical value read back:yes  Nurse who received alert:  Jinny Sanders, RN   MD notified (1st page):    Time of first page:   MD notified (2nd page):  Time of second page:  Responding MD:   Time MD responded:    Result is improved from previous value of 117 at 2032 05/01/12.  Free water restriction have been initiated and patient receiving NS infusion.  Result not called to MD as it was improved from previous value.  Nursing staff to continue to monitor

## 2012-05-02 NOTE — Progress Notes (Signed)
UR Chart Review Completed  

## 2012-05-02 NOTE — Progress Notes (Signed)
CRITICAL VALUE ALERT  Critical value received:Sodium = 119  Date of notification:  05/02/12  Time of notification:  0535  Critical value read back:yes  Nurse who received alert:  Jinny Sanders   MD notified (1st page):    Time of first page:    MD notified (2nd page):  Time of second page:  Responding MD:    Time MD responded:   Result is the same as previously reported at [facility] 05/02/12. Free water restriction have been initiated and patient receiving NS infusion. Result not called to MD as it was the same as the previous value. Nursing staff to continue to monitor

## 2012-05-02 NOTE — Progress Notes (Signed)
[name], [name]               ACCOUNT NO.:  [id number]  MEDICAL RECORD NO.:  [id number]  LOCATION:  A326                          FACILITY:  [facility]  PHYSICIAN:  Purcell Nails, MD DATE OF BIRTH:  Apr 21, 1931  DATE OF PROCEDURE:  05/02/2012 DATE OF DISCHARGE:                                PROGRESS NOTE   REASON FOR FOLLOWUP:  Hyponatremia.  SUBJECTIVE:  The patient feels very well today.  His sodium level is improving.  His appetite is good.  No new development.  OBJECTIVE:  GENERAL:  He is alert and oriented x3. VITAL SIGNS:  His blood pressure is 122/58, pulse rate is 77, respiratory rate is 18.  He has total intake of 3.0 L and total output of 1.1 L, making him a net positive 2.5 L over the last 24 hours. HEENT:  Well hydrated. NECK:  Negative for JVD. CHEST:  Clear to auscultation bilaterally. CARDIOVASCULAR:  Normal S1, S2.  No murmur.  No gallop. ABDOMEN:  Soft and nontender. EXTREMITIES:  Left knee is red status post surgery.  LABORATORY DATA:  Sodium 119, potassium 3.9, chloride 89, bicarb 23, BUN 10, creatinine 0.78, calcium 8.3.  His a.m. cortisol was normal at 15.1. His free T4 normal at 1.2.  TSH normal at 2.8.  ASSESSMENT: 1. Hyponatremia, improving. 2. Hypokalemia, resolved. 3. Postoperative anemia. 4. Osteoarthritis. 5. Coronary artery disease.  PLAN:  The patient is responding to IV normal saline and fluid restriction with an acceptable rate of correction of hyponatremia.  The etiology for his hyponatremia still appears to be multifactorial including decreasing effective circulating volume pertaining to his recent surgery as well as a brief use of diuretic prior to his hospitalization.  I will continue to monitor his sodium level every 12 hours, while he remains on normal saline at 100 mL/h, and restricted free water to less than 400 mL in 24 hours.  I do not see the need for other therapeutic measures at this point, and the likelihood of  underlying malignancy contributing ectopic ADH that seems to be unlikely.  Presenting diagnosis is still syndrome of inappropriate ADH secretion, likely triggered by a recent reduction in defective circulating volume.  I will continue to follow him in-house as well as an outpatient if needed.          ______________________________ Purcell Nails, MD     GN/MEDQ  D:  05/02/2012  T:  05/02/2012  Job:  [id number]

## 2012-05-02 NOTE — Progress Notes (Signed)
Patient ID: [name], male   DOB: 1931-07-04, 78 y.o.   MRN: [id number]

## 2012-05-02 NOTE — Progress Notes (Signed)
NAMEDEVLAN, STAVINOHA               ACCOUNT NO.:  [id number]  MEDICAL RECORD NO.:  [id number]  LOCATION:  A326                          FACILITY:  [facility]  PHYSICIAN:  Kingsley Callander. [name], MD       DATE OF BIRTH:  20-Jan-1931  DATE OF PROCEDURE:  05/02/2012 DATE OF DISCHARGE:                                PROGRESS NOTE   He continues to experience generalized weakness.  He feels that his thinking is clear.  He has been seen in endocrine consultation by Dr. Fransico Him.  PHYSICAL EXAMINATION:  VITAL SIGNS:  Normal.  He has had a positive I and O of 2510. LUNGS:  Clear. HEART:  Regular with a grade 2 systolic murmur. ABDOMEN:  Soft and nontender. EXTREMITIES:  No edema.  His left knee appears to be healing well.  IMPRESSION/PLAN:  Hyponatremia.  Serum sodium has improved slowly to 119.  His serum osmolality was low at 245.  He has an a.m. cortisol of 15.1.  He has euthyroid with a TSH of 2.84 with a free T4 of 1.22. He has been placed on free water restriction.  He remains on IV saline. His rate has been dropped to 100 mL/h.  He has a urine osmolality of 376. He previously had a chest x-ray on August 20 which revealed no acute abnormalities.  ASSESSMENT: 1. Status post left total knee replacement.  Continue physical     therapy. 2. Hypokalemia.  Serum potassium is again normal at 3.9. 3. Postoperative anemia.     Kingsley Callander. [name], MD     ROF/MEDQ  D:  05/02/2012  T:  05/02/2012  Job:  [id number]

## 2012-05-02 NOTE — Progress Notes (Signed)
Ambulated patient in hall with front wheeled walker, tolerated well

## 2012-05-03 LAB — BASIC METABOLIC PANEL
Calcium: 8.6 mg/dL (ref 8.4–10.5)
GFR calc Af Amer: >90 mL/min (ref >90)
GFR calc non Af Amer: 82 mL/min — ABNORMAL LOW (ref >90)
GFR calc non Af Amer: 80 mL/min — ABNORMAL LOW (ref >90)
Potassium: 4 mEq/L (ref 3.5–5.1)
Sodium: 123 mEq/L — ABNORMAL LOW (ref 135–145)
Sodium: 123 mEq/L — ABNORMAL LOW (ref 135–145)

## 2012-05-03 MED ORDER — POTASSIUM CHLORIDE CRYS ER 20 MEQ PO TBCR
20.0000 meq | EXTENDED_RELEASE_TABLET | Freq: Two times a day (BID) | ORAL | Status: AC
  Administered 2012-05-03 – 2012-05-04 (×4): 20 meq via ORAL
  Filled 2012-05-03 (×4): qty 1

## 2012-05-03 MED ORDER — MAGNESIUM HYDROXIDE 400 MG/5ML PO SUSP
30.0000 mL | Freq: Every day | ORAL | Status: DC | PRN
  Administered 2012-05-03 – 2012-05-04 (×2): 30 mL via ORAL
  Filled 2012-05-03 (×2): qty 30

## 2012-05-03 NOTE — Progress Notes (Signed)
[name], [name]               ACCOUNT NO.:  [id number]  MEDICAL RECORD NO.:  [id number]  LOCATION:  A326                          FACILITY:  [facility]  PHYSICIAN:  Kingsley Callander. [name], MD       DATE OF BIRTH:  1930-10-11  DATE OF PROCEDURE:  05/03/2012 DATE OF DISCHARGE:                                PROGRESS NOTE   [name] is feeling better today.  He is feeling stronger.  He has continued saline administration and fluid restriction.  His intake and output was a positive 2510 yesterday:  He is voiding frequently.  PHYSICAL EXAMINATION:  VITAL SIGNS:  Temperature is 98.1, pulse 84, blood pressure 146/76. LUNGS:  Clear. HEART:  Regular with a grade 2 systolic murmur. ABDOMEN:  Soft and nontender. EXTREMITIES:  No edema. EXTREMITIES:  His left knee is stable.  IMPRESSION/PLAN: 1. Hyponatremia.  Serum sodium is trended up from 119 to 123.  BUN and     creatinine are 9 and 0.83.  Continue current treatment. 2. Status post left knee replacement.  Continue physical therapy. 3. Postop anemia. 4. Coronary artery disease, stable.     Kingsley Callander. [name], MD     ROF/MEDQ  D:  05/03/2012  T:  05/03/2012  Job:  [id number]

## 2012-05-03 NOTE — Progress Notes (Signed)
Patient ID: [name], male   DOB: 20-Feb-1931, 78 y.o.   MRN: [id number]

## 2012-05-03 NOTE — Progress Notes (Signed)
Physical Therapy Treatment Patient Details Name: [name] MRN: [id number] DOB: [birth year] Today's Date: 05/03/2012 Time: 1320-1350 PT Time Calculation (min): 30 min 1 therex 1 gt  PT Assessment / Plan / Recommendation Comments on Treatment Session  Patient doing very well. Completed total of 360" of gait training with RW;supervision;no LOB. Continue with knee flexion exercises    Follow Up Recommendations       Barriers to Discharge        Equipment Recommendations       Recommendations for Other Services    Frequency     Plan      Precautions / Restrictions     Pertinent Vitals/Pain     Mobility  Transfers Sit to Stand: 6: Modified independent (Device/Increase time) Stand to Sit: 6: Modified independent (Device/Increase time) Ambulation/Gait Ambulation/Gait Assistance: 5: Supervision Ambulation Distance (Feet): 360 Feet (180' x2 with seated rest break between) Gait Pattern: Step-to pattern;Decreased stance time - left;Decreased stride length Gait velocity: WNL    Exercises General Exercises - Lower Extremity Long Arc Quad: Both;15 reps Hip ABduction/ADduction: Both;15 reps;Seated (resisted) Toe Raises: 20 reps;Both Heel Raises: Both;20 reps Mini-Sqauts: 10 reps Other Exercises Other Exercises: seated knee flexion stretch 3x 30"holds   PT Diagnosis:    PT Problem List:   PT Treatment Interventions:     PT Goals    Visit Information  Last PT Received On: 05/03/12    Subjective Data      Cognition       Balance     End of Session PT - End of Session Equipment Utilized During Treatment: Gait belt Activity Tolerance: Patient tolerated treatment well Patient left: in chair;with call bell/phone within reach CPM Left Knee CPM Left Knee: Off   GP     Greig Altergott ATKINSO 05/03/2012, 1:59 PM

## 2012-05-03 NOTE — Progress Notes (Signed)
[name], [name]               ACCOUNT NO.:  [id number]  MEDICAL RECORD NO.:  [id number]  LOCATION:  A326                          FACILITY:  [facility]  PHYSICIAN:  Purcell Nails, MD DATE OF BIRTH:  09/14/1930  DATE OF PROCEDURE:  05/03/2012 DATE OF DISCHARGE:                                PROGRESS NOTE   Follow up hyponatremia.  SUBJECTIVE:  The patient feels well.  His sodium level is now 123, has good appetite.  No new complaints.  OBJECTIVE:  GENERAL:  The patient is alert and oriented x3. VITAL SIGNS:  Within normal limits. HEENT:  Well hydrated. CHEST:  Clear to auscultation bilaterally. CARDIOVASCULAR:  Normal S1, S2.  No murmur.  No gallop. ABDOMEN:  Soft and nontender. EXTREMITIES:  Left in dressing status post surgery.  His input was 2.8 L, output was 2.7 L, balance is positive 100 mL.  LABORATORY DATA:  Sodium 123, potassium 3.8, chloride 91, bicarb 25, BUN 9, creatinine 0.83.  ASSESSMENT: 1. Hyponatremia, improving. 2. Hypokalemia, resolved. 3. Postoperative anemia. 4. Osteoarthritis. 5. Coronary artery disease.  PLAN:  Continue normal saline at 50 mL/h, and fluid restriction with free water to less than 400 mL in 24 hours.  Plan to see at least sodium of 125 before discharge, and I like to see him in the clinic for measurement and re-evaluation to see if he needs any other pharmaceutical interpretation.         ______________________________ Purcell Nails, MD    GN/MEDQ  D:  05/03/2012  T:  05/03/2012  Job:  [id number]

## 2012-05-04 LAB — BASIC METABOLIC PANEL
Chloride: 89 mEq/L — ABNORMAL LOW (ref 96–112)
Creatinine, Ser: 0.85 mg/dL (ref 0.50–1.35)
GFR calc Af Amer: >90 mL/min (ref >90)
GFR calc non Af Amer: 81 mL/min — ABNORMAL LOW (ref >90)
Potassium: 4.2 mEq/L (ref 3.5–5.1)

## 2012-05-04 NOTE — Progress Notes (Signed)
Patient ambulating halls well with RW and wife at side.  Still no BM after giving MOM earlier this AM.

## 2012-05-04 NOTE — Progress Notes (Signed)
[name], [name]               ACCOUNT NO.:  [id number]  MEDICAL RECORD NO.:  [id number]  LOCATION:  A326                          FACILITY:  [facility]  PHYSICIAN:  Kingsley Callander. [name], MD       DATE OF BIRTH:  May 02, 1931  DATE OF PROCEDURE:  05/04/2012 DATE OF DISCHARGE:                                PROGRESS NOTE   He is feeling better.  He is feeling some constipation and was not able to have a bowel movement overnight after receiving milk of magnesia.  He is not feeling as lightheaded.  He remains on fluid restriction and saline.  OBJECTIVE:  VITAL SIGNS:  Normal.  Temperature is 98.8 with a pulse of 80 and blood pressure 126/65. LUNGS:  Clear. HEART:  Regular with a grade 2 systolic murmur. ABDOMEN:  Bowel sounds are present, soft, and nontender. EXTREMITIES:  Left knee wound is healing well.  Steri-Strips are in place.  He is wearing a compression stocking. NEURO:  Ambulation is improving.  IMPRESSION/PLAN: 1. Hyponatremia, syndrome of inappropriate antidiuretic hormone.     Sodium level is unchanged over the past 3 checks.  He is still at     123.  We will continue fluid restriction and IV normal saline.     Discussed with Dr. Fransico Him.  Renal function and potassium are normal. 2. Status post left knee replacement.  Continue physical therapy.     Continue ambulation. 3. Anemia.  Recheck hemoglobin.     Kingsley Callander. [name], MD     ROF/MEDQ  D:  05/04/2012  T:  05/04/2012  Job:  [id number]

## 2012-05-05 LAB — CBC
HCT: 29.5 % — ABNORMAL LOW (ref 39.0–52.0)
Hemoglobin: 9.8 g/dL — ABNORMAL LOW (ref 13.0–17.0)
RDW: 17.2 % — ABNORMAL HIGH (ref 11.5–15.5)
WBC: 8.5 10*3/uL (ref 4.0–10.5)

## 2012-05-05 LAB — BASIC METABOLIC PANEL
BUN: 11 mg/dL (ref 6–23)
Chloride: 90 mEq/L — ABNORMAL LOW (ref 96–112)
GFR calc Af Amer: >90 mL/min (ref >90)
Potassium: 4.3 mEq/L (ref 3.5–5.1)
Sodium: 123 mEq/L — ABNORMAL LOW (ref 135–145)

## 2012-05-05 NOTE — Progress Notes (Signed)
Patient with orders to be discharge to home. Discharge instructions given, patient and spouse verbalized understanding. Patient in stable condition upon discharge. Patient left with spouse via private vehicle.

## 2012-05-05 NOTE — Discharge Summary (Signed)
[name], DOBROWSKI               ACCOUNT NO.:  [id number]  MEDICAL RECORD NO.:  [id number]  LOCATION:  A326                          FACILITY:  [facility]  PHYSICIAN:  Kingsley Callander. [name], MD       DATE OF BIRTH:  [birth year]  DATE OF ADMISSION:  04/29/2012 DATE OF DISCHARGE:  09/15/2013LH                              DISCHARGE SUMMARY   DISCHARGE DIAGNOSES: 1. Hyponatremia. 2. Syndrome of inappropriate antidiuretic hormone . 3. Osteoarthritis status post left knee replacement. 4. Hypothyroidism. 5. Coronary artery disease. 6. Hypertension. 7. Hyperlipidemia. 8. Gout. 9. Carotid artery disease.  HOSPITAL COURSE:  This patient is a 78 year old male who was admitted after developing hyponatremia following discharge from this hospitalization for left knee replacement.  He was feeling weak.  He saw an orthopedist and had laboratory studies drawn.  His sodium level was 115.  He was advised to have prompt medical followup.  At that point, his sodium level was 111 on admission.  He was treated with IV normal saline.  He was placed on free water restriction.  He was seen in Endocrinology consultation by Dr. Fransico Him.  He had a serum osmolality of 245 and a urine osmolality of 376.  Urine sodium level was 61; however, he had been on chlorthalidone and Lasix.  Diuretics were stopped.  His urinalysis was negative on admission with the specific gravity of 1.010. He was euthyroid with a TSH of 2.84 with a free T4 of 1.22.  His a.m. cortisol was 15.1. His sodium level gradually improved.  His sodium level rose to 123 on the May 03, 2012, and remained 123 for the next 2 days despite ongoing treatment.  He is feeling much better.  He is stronger.  He is walking and rehabilitating his knee.  He was evaluated and treated with Physical Therapy.  His anemia has improved.  His hemoglobin is up to 9.8.  His white blood cell count is normalized to 8.5 and platelet count is normal at 379,000.  He was felt  to be stable for discharge on the morning of May 05, 2012.  He will have a followup metabolic profile in 2 days.  He will be seen in followup in the office in a week.  His salt intake has been liberalized.  He will start a salt tablet daily.  He will refrain from any diuretics and will limit free water intake to 800 mL daily.  DISCHARGE MEDICATIONS: 1. Allopurinol 600 mg daily. 2. Aspirin 81 mg daily. 3. Plavix 75 mg daily. 4. Lipitor 20 mg daily. 5. Synthroid 125 mcg daily. 6. Toprol-XL 150 mg daily. 7. Multivitamin daily. 8. Nitroglycerin 0.4 mg q.5 minutes x3 p.r.n. 9. Prilosec 20 mg daily. 10.Vitamin B6 100 mg daily. 11.Flomax 0.4 mg daily. 12.Testosterone 200 mg IM every 2 weeks. 13.Tramadol 50 mg q.6 p.r.n. 14.Diovan 320 mg daily.  FOLLOWUP:  The patient will be seen in 1 week.     Kingsley Callander. [name], MD     ROF/MEDQ  D:  05/05/2012  T:  05/05/2012  Job:  [id number]  cc:   Claude Manges. Cleophas Dunker, M.D. Fax: [phone]  Purcell Nails, MD Fax: [phone]

## 2012-05-16 ENCOUNTER — Ambulatory Visit: Payer: Medicare Other | Admitting: Neurosurgery

## 2012-05-16 ENCOUNTER — Other Ambulatory Visit: Payer: Medicare Other

## 2012-05-31 ENCOUNTER — Encounter: Payer: Self-pay | Admitting: Neurosurgery

## 2012-06-03 ENCOUNTER — Encounter: Payer: Self-pay | Admitting: Neurosurgery

## 2012-06-03 ENCOUNTER — Other Ambulatory Visit (INDEPENDENT_AMBULATORY_CARE_PROVIDER_SITE_OTHER): Payer: Medicare Other | Admitting: *Deleted

## 2012-06-03 ENCOUNTER — Ambulatory Visit (INDEPENDENT_AMBULATORY_CARE_PROVIDER_SITE_OTHER): Payer: Medicare Other | Admitting: Neurosurgery

## 2012-06-03 VITALS — BP 138/80 | HR 75 | Resp 16 | Ht 70.0 in | Wt 181.1 lb

## 2012-06-03 DIAGNOSIS — Z48812 Encounter for surgical aftercare following surgery on the circulatory system: Secondary | ICD-10-CM

## 2012-06-03 DIAGNOSIS — I6529 Occlusion and stenosis of unspecified carotid artery: Secondary | ICD-10-CM

## 2012-06-03 NOTE — Progress Notes (Signed)
[facility] Carotid Office Note  CC: Carotid surveillance Referring Physician: Brabham  History of Present Illness: 78 year old male patient of Dr. Myra Gianotti status post left CEA in 2009. The patient denies signs or symptoms of CVA, TIA, amaurosis fugax or any neural deficit.  Past Medical History  Diagnosis Date  . Hyperlipidemia   . Benign prostatic hypertrophy   . Carotid artery disease     Status post left carotid endarterectomy / Dr.   Myra Gianotti   . CAD (coronary artery disease)     DES, SVG to circumflex marginal, October, 2010 ( SVG to PDA and PLA patent , LIMA to LAD patent, EF 60%, mild inferior hypokinesis  . Syncope     Remote, etiology unknown  . Psoriasis     severe; with total skin exfoliation in the past resolved  . Systolic murmur   . Ejection fraction     EF 60% cardiac catheterization October, 2010  . Fluid overload     Mild,prn diuretic  . Hx of decompressive lumbar laminectomy     Dr.Botero December, 2011  . Hx of CABG   . Aortic stenosis     Mild, echo, August, 2012  . Preop cardiovascular exam     Preop clearance for knee surgery, August, 2013  . HTN (hypertension)     takes meds daily  . Hypothyroidism   . History of kidney stones   . GERD (gastroesophageal reflux disease)     takes daily  . Hx of dizziness   . Arthritis   . Nephrolithiasis   . Neuromuscular disorder     ROS: [x]  Positive   [ ]  Denies    General: [ ]  Weight loss, [ ]  Fever, [ ]  chills Neurologic: [ ]  Dizziness, [ ]  Blackouts, [ ]  Seizure [ ]  Stroke, [ ]  "Mini stroke", [ ]  Slurred speech, [ ]  Temporary blindness; [ ]  weakness in arms or legs, [ ]  Hoarseness Cardiac: [ ]  Chest pain/pressure, [ ]  Shortness of breath at rest [ ]  Shortness of breath with exertion, [ ]  Atrial fibrillation or irregular heartbeat Vascular: [ ]  Pain in legs with walking, [ ]  Pain in legs at rest, [ ]  Pain in legs at night,  [ ]  Non-healing ulcer, [ ]  Blood clot in vein/DVT,     Pulmonary: [ ]  Home oxygen, [ ]  Productive cough, [ ]  Coughing up blood, [ ]  Asthma,  [ ]  Wheezing Musculoskeletal:  [ ]  Arthritis, [ ]  Low back pain, [ ]  Joint pain Hematologic: [ ]  Easy Bruising, [ ]  Anemia; [ ]  Hepatitis Gastrointestinal: [ ]  Blood in stool, [ ]  Gastroesophageal Reflux/heartburn, [ ]  Trouble swallowing Urinary: [ ]  chronic Kidney disease, [ ]  on HD - [ ]  MWF or [ ]  TTHS, [ ]  Burning with urination, [ ]  Difficulty urinating Skin: [ ]  Rashes, [ ]  Wounds Psychological: [ ]  Anxiety, [ ]  Depression   Social History History  Substance Use Topics  . Smoking status: Never Smoker   . Smokeless tobacco: Never Used  . Alcohol Use: Yes     Occasional    Family History Family History  Problem Relation Age of Onset  . Colon cancer Neg Hx   . Heart attack Mother   . Heart disease Father   . Heart attack Son     Allergies  Allergen Reactions  . Propylene Glycol Rash    Current Outpatient Prescriptions  Medication Sig Dispense Refill  . allopurinol (ZYLOPRIM) 300 MG tablet Take  600 mg by mouth daily.       Marland Kitchen aspirin EC 81 MG tablet Take 81 mg by mouth daily.      Marland Kitchen atorvastatin (LIPITOR) 20 MG tablet Take 20 mg by mouth every evening.       . clopidogrel (PLAVIX) 75 MG tablet Take 75 mg by mouth daily.      Marland Kitchen levothyroxine (SYNTHROID, LEVOTHROID) 125 MCG tablet Take 125 mcg by mouth daily.       . metoprolol (TOPROL-XL) 100 MG 24 hr tablet Take 150 mg by mouth daily.       . multivitamin (THERAGRAN) per tablet Take 1 tablet by mouth daily.       . nitroGLYCERIN (NITROSTAT) 0.4 MG SL tablet Place 0.4 mg under the tongue every 5 (five) minutes x 3 doses as needed. For chest pain      . omeprazole (PRILOSEC) 20 MG capsule Take 20 mg by mouth daily.       Marland Kitchen pyridOXINE (VITAMIN B-6) 100 MG tablet Take 100 mg by mouth daily.       . Tamsulosin HCl (FLOMAX) 0.4 MG CAPS Take 0.4 mg by mouth daily.       Marland Kitchen testosterone cypionate (DEPOTESTOTERONE CYPIONATE) 200 MG/ML  injection Inject 70 mg into the muscle every 14 (fourteen) days. Patient is only  Taking 70 mg      . valsartan (DIOVAN) 320 MG tablet Take 320 mg by mouth daily.         Physical Examination  Filed Vitals:   06/03/12 1452  BP: 138/80  Pulse: 75  Resp:     Body mass index is 25.99 kg/(m^2).  General:  WDWN in NAD Gait: Normal HEENT: WNL Eyes: Pupils equal Pulmonary: normal non-labored breathing , without Rales, rhonchi,  wheezing Cardiac: RRR, without  Murmurs, rubs or gallops; Abdomen: soft, NT, no masses Skin: no rashes, ulcers noted  Vascular Exam Pulses: 3+ radial pulses bilaterally Carotid bruits: Carotid pulses to auscultation no bruits are heard Extremities without ischemic changes, no Gangrene , no cellulitis; no open wounds;  Musculoskeletal: no muscle wasting or atrophy   Neurologic: A&O X 3; Appropriate Affect ; SENSATION: normal; MOTOR FUNCTION:  moving all extremities equally. Speech is fluent/normal  Non-Invasive Vascular Imaging CAROTID DUPLEX 06/03/2012  Right ICA 20 - 39 % stenosis Left ICA 0 - 19% stenosis   ASSESSMENT/PLAN: Asymptomatic patient status post left CEA in 2009. The patient will followup in one year with repeat carotid duplex. The patient's questions were encouraged and answered, he is in agreement with this plan.   Lauree Chandler ANP   Clinic MD: Myra Gianotti

## 2012-06-04 NOTE — Addendum Note (Signed)
Addended by: Sharee Pimple on: 06/04/2012 08:12 AM   Modules accepted: Orders

## 2012-12-11 ENCOUNTER — Emergency Department (HOSPITAL_COMMUNITY): Payer: Medicare Other

## 2012-12-11 ENCOUNTER — Encounter (HOSPITAL_COMMUNITY): Payer: Self-pay | Admitting: *Deleted

## 2012-12-11 ENCOUNTER — Observation Stay (HOSPITAL_COMMUNITY)
Admission: EM | Admit: 2012-12-11 | Discharge: 2012-12-13 | Disposition: A | Discharge status: Home / Self Care | Payer: Medicare Other | Attending: Internal Medicine | Admitting: Internal Medicine

## 2012-12-11 DIAGNOSIS — I359 Nonrheumatic aortic valve disorder, unspecified: Secondary | ICD-10-CM | POA: Insufficient documentation

## 2012-12-11 DIAGNOSIS — I350 Nonrheumatic aortic (valve) stenosis: Secondary | ICD-10-CM | POA: Diagnosis present

## 2012-12-11 DIAGNOSIS — R0600 Dyspnea, unspecified: Secondary | ICD-10-CM

## 2012-12-11 DIAGNOSIS — R0602 Shortness of breath: Secondary | ICD-10-CM | POA: Insufficient documentation

## 2012-12-11 DIAGNOSIS — R55 Syncope and collapse: Principal | ICD-10-CM | POA: Insufficient documentation

## 2012-12-11 DIAGNOSIS — E039 Hypothyroidism, unspecified: Secondary | ICD-10-CM | POA: Insufficient documentation

## 2012-12-11 DIAGNOSIS — E785 Hyperlipidemia, unspecified: Secondary | ICD-10-CM | POA: Diagnosis present

## 2012-12-11 DIAGNOSIS — I2510 Atherosclerotic heart disease of native coronary artery without angina pectoris: Secondary | ICD-10-CM | POA: Insufficient documentation

## 2012-12-11 DIAGNOSIS — S0181XA Laceration without foreign body of other part of head, initial encounter: Secondary | ICD-10-CM

## 2012-12-11 DIAGNOSIS — I779 Disorder of arteries and arterioles, unspecified: Secondary | ICD-10-CM | POA: Insufficient documentation

## 2012-12-11 DIAGNOSIS — S0012XA Contusion of left eyelid and periocular area, initial encounter: Secondary | ICD-10-CM

## 2012-12-11 DIAGNOSIS — D649 Anemia, unspecified: Secondary | ICD-10-CM

## 2012-12-11 DIAGNOSIS — I10 Essential (primary) hypertension: Secondary | ICD-10-CM | POA: Insufficient documentation

## 2012-12-11 LAB — CBC WITH DIFFERENTIAL/PLATELET
HCT: 36.8 % — ABNORMAL LOW (ref 39.0–52.0)
Hemoglobin: 12 g/dL — ABNORMAL LOW (ref 13.0–17.0)
Lymphocytes Relative: 10 % — ABNORMAL LOW (ref 12–46)
Lymphs Abs: 1.2 10*3/uL (ref 0.7–4.0)
MCHC: 32.6 g/dL (ref 30.0–36.0)
Monocytes Absolute: 1 10*3/uL (ref 0.1–1.0)
Monocytes Relative: 9 % (ref 3–12)
Neutro Abs: 9.4 10*3/uL — ABNORMAL HIGH (ref 1.7–7.7)
RBC: 4.49 MIL/uL (ref 4.22–5.81)
WBC: 11.7 10*3/uL — ABNORMAL HIGH (ref 4.0–10.5)

## 2012-12-11 LAB — COMPREHENSIVE METABOLIC PANEL
ALT: 36 U/L (ref 0–53)
AST: 42 U/L — ABNORMAL HIGH (ref 0–37)
Alkaline Phosphatase: 98 U/L (ref 39–117)
CO2: 31 mEq/L (ref 19–32)
Chloride: 99 mEq/L (ref 96–112)
GFR calc Af Amer: 68 mL/min — ABNORMAL LOW (ref >90)
GFR calc non Af Amer: 59 mL/min — ABNORMAL LOW (ref >90)
Glucose, Bld: 142 mg/dL — ABNORMAL HIGH (ref 70–99)
Potassium: 3.2 mEq/L — ABNORMAL LOW (ref 3.5–5.1)
Sodium: 138 mEq/L (ref 135–145)

## 2012-12-11 LAB — MRSA PCR SCREENING: MRSA by PCR: NEGATIVE

## 2012-12-11 LAB — PRO B NATRIURETIC PEPTIDE: Pro B Natriuretic peptide (BNP): 1139 pg/mL — ABNORMAL HIGH (ref 0–450)

## 2012-12-11 MED ORDER — PANTOPRAZOLE SODIUM 40 MG PO TBEC: 40.0000 mg | DELAYED_RELEASE_TABLET | Freq: Every day | ORAL | Status: DC

## 2012-12-11 MED ORDER — LEVOTHYROXINE SODIUM 25 MCG PO TABS
125.0000 ug | ORAL_TABLET | Freq: Every day | ORAL | Status: DC
  Administered 2012-12-12 – 2012-12-13 (×2): 125 ug via ORAL
  Filled 2012-12-11 (×2): qty 1

## 2012-12-11 MED ORDER — IRBESARTAN 150 MG PO TABS
150.0000 mg | ORAL_TABLET | Freq: Every day | ORAL | Status: DC
  Administered 2012-12-12 – 2012-12-13 (×2): 150 mg via ORAL
  Filled 2012-12-11 (×2): qty 1

## 2012-12-11 MED ORDER — ASPIRIN EC 81 MG PO TBEC: 81.0000 mg | DELAYED_RELEASE_TABLET | Freq: Every day | ORAL | Status: DC

## 2012-12-11 MED ORDER — NITROGLYCERIN 0.4 MG SL SUBL: 0.4000 mg | SUBLINGUAL_TABLET | SUBLINGUAL | Status: DC | PRN

## 2012-12-11 MED ORDER — METOPROLOL SUCCINATE ER 50 MG PO TB24
150.0000 mg | ORAL_TABLET | Freq: Every day | ORAL | Status: DC
  Administered 2012-12-12 – 2012-12-13 (×2): 150 mg via ORAL
  Filled 2012-12-11 (×2): qty 3

## 2012-12-11 MED ORDER — ALLOPURINOL 300 MG PO TABS
600.0000 mg | ORAL_TABLET | Freq: Every day | ORAL | Status: DC
  Administered 2012-12-12 – 2012-12-13 (×2): 600 mg via ORAL
  Filled 2012-12-11: qty 1
  Filled 2012-12-11: qty 2
  Filled 2012-12-11: qty 1

## 2012-12-11 MED ORDER — CLOPIDOGREL BISULFATE 75 MG PO TABS
75.0000 mg | ORAL_TABLET | Freq: Every day | ORAL | Status: DC
  Administered 2012-12-12 – 2012-12-13 (×2): 75 mg via ORAL
  Filled 2012-12-11 (×2): qty 1

## 2012-12-11 MED ORDER — POTASSIUM CHLORIDE CRYS ER 20 MEQ PO TBCR
40.0000 meq | EXTENDED_RELEASE_TABLET | Freq: Once | ORAL | Status: AC
  Administered 2012-12-11: 40 meq via ORAL
  Filled 2012-12-11: qty 2

## 2012-12-11 MED ORDER — ATORVASTATIN CALCIUM 20 MG PO TABS
20.0000 mg | ORAL_TABLET | Freq: Every day | ORAL | Status: DC
Start: 2012-12-11 — End: 2012-12-13
  Administered 2012-12-11 – 2012-12-12 (×2): 20 mg via ORAL
  Filled 2012-12-11 (×2): qty 1

## 2012-12-11 MED ORDER — VITAMIN B-6 50 MG PO TABS
100.0000 mg | ORAL_TABLET | Freq: Every day | ORAL | Status: DC
  Administered 2012-12-12 – 2012-12-13 (×2): 100 mg via ORAL
  Filled 2012-12-11 (×2): qty 2

## 2012-12-11 MED ORDER — TESTOSTERONE CYPIONATE 200 MG/ML IM SOLN
70.0000 mg | INTRAMUSCULAR | Status: DC
  Administered 2012-12-12: 70 mg via INTRAMUSCULAR
  Filled 2012-12-11: qty 0.35

## 2012-12-11 MED ORDER — IOHEXOL 350 MG/ML SOLN
100.0000 mL | Freq: Once | INTRAVENOUS | Status: AC | PRN
  Administered 2012-12-11: 100 mL via INTRAVENOUS

## 2012-12-11 MED ORDER — TAMSULOSIN HCL 0.4 MG PO CAPS
0.4000 mg | ORAL_CAPSULE | Freq: Every day | ORAL | Status: DC
  Administered 2012-12-12 – 2012-12-13 (×2): 0.4 mg via ORAL
  Filled 2012-12-11 (×2): qty 1

## 2012-12-11 MED ORDER — ENOXAPARIN SODIUM 40 MG/0.4ML ~~LOC~~ SOLN
40.0000 mg | Freq: Every day | SUBCUTANEOUS | Status: DC
  Administered 2012-12-11 – 2012-12-12 (×2): 40 mg via SUBCUTANEOUS
  Filled 2012-12-11 (×2): qty 0.4

## 2012-12-11 NOTE — H&P (Signed)
[id number] 

## 2012-12-11 NOTE — ED Provider Notes (Signed)
History  This chart was scribed for Dione Booze, MD by Shari Heritage and Lacey Jensen, ED Scribes. The patient was seen in room APA14/APA14. Patient's care was started at 1656.   CSN: [id number]  Arrival date & time 12/11/12  1626   First MD Initiated Contact with Patient 12/11/12 1656      Chief Complaint  Patient presents with  . Loss of Consciousness    The history is provided by the patient. No language interpreter was used.    HPI Comments: [name] is a 79 y.o. male with history of coronary artery disease, hypertension, systolic murmur, GERD, hypothyroidism who presents to the Emergency Department complaining of syncopal episode that occurred prior to arival. Patient states that he was playing golf and forgot his wallet. While trying to locate it, he became short of breath when climbing a hill. Patient states that he suddenly lost consciousness and fell hitting the left side of his face on concrete. Patient is unsure how long he was unconscious, but believes it may have been up to two minutes. Patient says he sometimes has dyspnea with sudden exertion, but usually doesn't have much difficulty breathing. Patient also has a laceration lateral to the left eye and bruising around the eye. He denies CP or any other pain at this time. PCP is Education officer, environmental. He believes he has had a tetanus shot in Dr. Alonza Smoker office within the past year.   Past Medical History  Diagnosis Date  . Hyperlipidemia   . Benign prostatic hypertrophy   . Carotid artery disease     Status post left carotid endarterectomy / Dr.   Myra Gianotti   . CAD (coronary artery disease)     DES, SVG to circumflex marginal, October, 2010 ( SVG to PDA and PLA patent , LIMA to LAD patent, EF 60%, mild inferior hypokinesis  . Syncope     Remote, etiology unknown  . Psoriasis     severe; with total skin exfoliation in the past resolved  . Systolic murmur   . Ejection fraction     EF 60% cardiac catheterization October, 2010  . Fluid  overload     Mild,prn diuretic  . Hx of decompressive lumbar laminectomy     Dr.Botero December, 2011  . Hx of CABG   . Aortic stenosis     Mild, echo, August, 2012  . Preop cardiovascular exam     Preop clearance for knee surgery, August, 2013  . HTN (hypertension)     takes meds daily  . Hypothyroidism   . History of kidney stones   . GERD (gastroesophageal reflux disease)     takes daily  . Hx of dizziness   . Arthritis   . Nephrolithiasis   . Neuromuscular disorder     Past Surgical History  Procedure Laterality Date  . Coronary artery bypass graft  1995  . Decompression of left median nerve    . Left carotid endarectomy and dacron patch angioplasty]    . Bilateral l3-4 laminectomies    . Removal of synovial cyst    . Posterior arthrodesis with autograft and allograft    . Colonoscopy  11/23/2011    Procedure: COLONOSCOPY;  Surgeon: Malissa Hippo, MD;  Location: [facility];  Service: Endoscopy;  Laterality: N/A;  730  . Carotid endarterectomy  2005    left  . Back surgery      x3  . Tonsillectomy    . Appendectomy    . Knee arthroplasty  04/16/2012    left total  . Total knee arthroplasty  04/16/2012    Procedure: TOTAL KNEE ARTHROPLASTY;  Surgeon: Valeria Batman, MD;  Location: [facility] OR;  Service: Orthopedics;  Laterality: Left;  Marland Kitchen Eye surgery      cataracts; bilateral  . Joint replacement  Sept. 27, 2013    Left Knee    Family History  Problem Relation Age of Onset  . Colon cancer Neg Hx   . Heart attack Mother   . Heart disease Father   . Heart attack Son     History  Substance Use Topics  . Smoking status: Never Smoker   . Smokeless tobacco: Never Used  . Alcohol Use: Yes     Comment: Occasional      Review of Systems  Constitutional: Negative for fever and chills.  HENT: Negative for congestion and rhinorrhea.   Respiratory: Positive for shortness of breath.   Cardiovascular: Negative for chest pain.  Gastrointestinal: Negative for  vomiting and abdominal pain.  Genitourinary: Negative for difficulty urinating.  Musculoskeletal: Negative for back pain.  Skin: Positive for wound.  Neurological: Positive for syncope.  All other systems reviewed and are negative.    Allergies  Propylene glycol  Home Medications   Current Outpatient Rx  Name  Route  Sig  Dispense  Refill  . acetaminophen (TYLENOL) 500 MG tablet   Oral   Take 1,000 mg by mouth every 6 (six) hours as needed for pain.         Marland Kitchen allopurinol (ZYLOPRIM) 300 MG tablet   Oral   Take 600 mg by mouth daily.          Marland Kitchen aspirin EC 81 MG tablet   Oral   Take 81 mg by mouth daily.         Marland Kitchen atorvastatin (LIPITOR) 20 MG tablet   Oral   Take 20 mg by mouth every evening.          . clopidogrel (PLAVIX) 75 MG tablet   Oral   Take 75 mg by mouth daily.         Marland Kitchen levothyroxine (SYNTHROID, LEVOTHROID) 125 MCG tablet   Oral   Take 125 mcg by mouth daily.          . metoprolol (TOPROL-XL) 100 MG 24 hr tablet   Oral   Take 150 mg by mouth daily.          . multivitamin (THERAGRAN) per tablet   Oral   Take 1 tablet by mouth daily.          . nitroGLYCERIN (NITROSTAT) 0.4 MG SL tablet   Sublingual   Place 0.4 mg under the tongue every 5 (five) minutes x 3 doses as needed. For chest pain         . omeprazole (PRILOSEC) 20 MG capsule   Oral   Take 20 mg by mouth daily.          Marland Kitchen pyridOXINE (VITAMIN B-6) 100 MG tablet   Oral   Take 100 mg by mouth daily.          . Tamsulosin HCl (FLOMAX) 0.4 MG CAPS   Oral   Take 0.4 mg by mouth daily.          Marland Kitchen testosterone cypionate (DEPOTESTOTERONE CYPIONATE) 200 MG/ML injection   Intramuscular   Inject 70 mg into the muscle every 14 (fourteen) days. Patient is only  Taking 70 mg         .  valsartan (DIOVAN) 320 MG tablet   Oral   Take 320 mg by mouth daily.            Triage Vitals: BP 153/72  Pulse 75  Temp(Src) 97.8 F (36.6 C) (Oral)  Resp 18  SpO2  96%  Physical Exam  Constitutional: He is oriented to person, place, and time. He appears well-developed and well-nourished. No distress.  HENT:  Head: Normocephalic.  Right Ear: Tympanic membrane normal. No hemotympanum.  Left Ear: Tympanic membrane normal. No hemotympanum.  Left periorbital ecchymosis. 2 cm laceration, lateral and superior to the left eye. No stepoff of the orbital rim.  Eyes:  Pupils 3mm. Fundi poorly seen but grossly nml. Arcus senilis present  Neck:  Stiff cervical collar in place. Nontender.  Cardiovascular: Normal rate and regular rhythm.   Pulmonary/Chest: Effort normal and breath sounds normal.  Abdominal: Soft. There is no tenderness.  Musculoskeletal:  Venous stasis changes present.  Neurological: He is alert and oriented to person, place, and time.  Skin: Skin is warm and dry.    ED Course  Procedures (including critical care time) DIAGNOSTIC STUDIES: Oxygen Saturation is 96% on room air, adequate by my interpretation.    COORDINATION OF CARE: 5:14 PM- Patient informed of current plan for treatment and evaluation and agrees with plan at this time.   LACERATION REPAIR Performed by: [email or web address] Authorized by: [email or web address],[email or web address] Consent: Verbal consent obtained. Risks and benefits: risks, benefits and alternatives were discussed Consent given by: patient Patient identity confirmed: provided demographic data Prepped and Draped in normal sterile fashion Wound explored  Laceration Location: left periorbital  Laceration Length: 2.0 cm  No Foreign Bodies seen or palpated  Anesthesia: none  Amount of cleaning: standard  Skin closure: close  Technique: Dermabond  Patient tolerance: Patient tolerated the procedure well with no immediate complications.   Labs Reviewed  CBC WITH DIFFERENTIAL - Abnormal; Notable for the following:    WBC 11.7 (*)    Hemoglobin 12.0 (*)    HCT 36.8 (*)    RDW 19.7 (*)    Neutrophils Relative 80 (*)    Neutro Abs  9.4 (*)    Lymphocytes Relative 10 (*)    All other components within normal limits  D-DIMER, QUANTITATIVE - Abnormal; Notable for the following:    D-Dimer, Quant 1.86 (*)    All other components within normal limits  PRO B NATRIURETIC PEPTIDE - Abnormal; Notable for the following:    Pro B Natriuretic peptide (BNP) 1139.0 (*)    All other components within normal limits  COMPREHENSIVE METABOLIC PANEL - Abnormal; Notable for the following:    Potassium 3.2 (*)    Glucose, Bld 142 (*)    AST 42 (*)    GFR calc non Af Amer 59 (*)    GFR calc Af Amer 68 (*)    All other components within normal limits  POCT I-STAT TROPONIN I    Dg Chest 1 View  12/11/2012  *RADIOLOGY REPORT*  Clinical Data: 79 year old male loss of consciousness.  Head injury.  CHEST - 1 VIEW  Comparison: 04/09/2012 and earlier.  Findings: AP upright view 1722 hours. Stable cardiomegaly and mediastinal contours.  Sequelae of CABG.  Increased pulmonary vascular congestion.  No pneumothorax or effusion.  No consolidation.  Cervical ACDF hardware partially visible.  IMPRESSION: Cardiomegaly with increased vascular congestion/interstitial edema. Less likely the appearance could reflect viral / atypical respiratory infection.   Original Report Authenticated By: Erskine Speed, M.D.  Ct Head Wo Contrast  12/11/2012  *RADIOLOGY REPORT*  Clinical Data:  Loss of consciousness.  Hit head.  Head injury.  CT HEAD WITHOUT CONTRAST CT MAXILLOFACIAL WITHOUT CONTRAST CT CERVICAL SPINE WITHOUT CONTRAST  Technique:  Multidetector CT imaging of the head, cervical spine, and maxillofacial structures were performed using the standard protocol without intravenous contrast. Multiplanar CT image reconstructions of the cervical spine and maxillofacial structures were also generated.  Comparison:  None  CT HEAD  Findings: Generalized atrophy.  Mild chronic microvascular ischemic change in the white matter.  No acute infarct.  Negative for hemorrhage or  mass.  Soft tissue swelling and hematoma overlying the left eye.  Negative for skull fracture.  There is an air-fluid level in the right maxillary sinus.  IMPRESSION: Atrophy and chronic microvascular ischemia.  No acute abnormality  CT MAXILLOFACIAL  Findings:  Moderate soft tissue swelling overlying the left eye without fracture.  No orbital fracture is identified on the left.  There is mucosal edema and an air-fluid level in the right maxillary sinus.  Fluid is relatively high density suggesting that this may be blood however I see no associated acute fracture.  This could also be due to infection.  Question swelling or trauma to the right orbit.  Negative for facial fracture.  The mandible is intact without fracture.  Zygomatic arches intact.  No fracture of the nasal bones or nasal septum.  IMPRESSION: Soft tissue swelling over the left eye without fracture.  Air-fluid level right maxillary sinus which could contain blood or infection.  No associated fracture.  CT CERVICAL SPINE  Findings:   ACDF C4-5 and C5-6.  There is pseudoarthrosis at both levels with persistent lucency through the disc space.  Advanced disc degeneration and spondylosis C3-4 and C6-7.  3 mm anterior slip C7-T1.  Advanced degenerative change C1-C2 with cystic changes in the dens and thickened transverse ligament of C1. Diffuse facet degeneration.  Negative for fracture.  Atherosclerotic carotid arteries.  IMPRESSION: Negative for fracture  ACDF with pseudoarthrosis C4-5 and C5-6.  Diffuse cervical spondylosis.   Original Report Authenticated By: Janeece Riggers, M.D.    Ct Angio Chest W/cm &/or Wo Cm  12/11/2012  *RADIOLOGY REPORT*  Clinical Data: 79 year old male with loss of consciousness, syncope, shortness of breath.  CT ANGIOGRAPHY CHEST  Technique:  Multidetector CT imaging of the chest using the standard protocol during bolus administration of intravenous contrast. Multiplanar reconstructed images including MIPs were obtained and  reviewed to evaluate the vascular anatomy.  Contrast: OMNIPAQUE IOHEXOL 350 MG/ML SOLN  Comparison: Chest radiographs 12/11/2012 and earlier.  Findings: Adequate contrast bolus timing in the pulmonary arterial tree.  No focal filling defect identified in the pulmonary arterial tree to suggest the presence of acute pulmonary embolism.  Atelectatic changes to the major airways.  Mild dependent atelectasis and crowding of lung markings.  Mild widespread peribronchial thickening.  Cardiomegaly.  Sequelae of CABG.  Coronary artery calcified atherosclerosis.  Calcified atherosclerosis of the aorta and great vessels.  Otherwise negative visualized aorta.  No pericardial or pleural effusion.  No mediastinal lymphadenopathy.  Negative visualized upper abdominal viscera.  Cervical ACDF hardware partially visible. No acute osseous abnormality identified.  IMPRESSION: 1. No evidence of acute pulmonary embolus. 2.  Cardiomegaly and atherosclerosis including coronary atherosclerosis. 2.  Pulmonary peribronchial thickening (could be reactive or viral airway disease) and atelectasis.   Original Report Authenticated By: Erskine Speed, M.D.    Ct Cervical Spine Wo Contrast  12/11/2012  *  RADIOLOGY REPORT*  Clinical Data:  Loss of consciousness.  Hit head.  Head injury.  CT HEAD WITHOUT CONTRAST CT MAXILLOFACIAL WITHOUT CONTRAST CT CERVICAL SPINE WITHOUT CONTRAST  Technique:  Multidetector CT imaging of the head, cervical spine, and maxillofacial structures were performed using the standard protocol without intravenous contrast. Multiplanar CT image reconstructions of the cervical spine and maxillofacial structures were also generated.  Comparison:  None  CT HEAD  Findings: Generalized atrophy.  Mild chronic microvascular ischemic change in the white matter.  No acute infarct.  Negative for hemorrhage or mass.  Soft tissue swelling and hematoma overlying the left eye.  Negative for skull fracture.  There is an air-fluid level in  the right maxillary sinus.  IMPRESSION: Atrophy and chronic microvascular ischemia.  No acute abnormality  CT MAXILLOFACIAL  Findings:  Moderate soft tissue swelling overlying the left eye without fracture.  No orbital fracture is identified on the left.  There is mucosal edema and an air-fluid level in the right maxillary sinus.  Fluid is relatively high density suggesting that this may be blood however I see no associated acute fracture.  This could also be due to infection.  Question swelling or trauma to the right orbit.  Negative for facial fracture.  The mandible is intact without fracture.  Zygomatic arches intact.  No fracture of the nasal bones or nasal septum.  IMPRESSION: Soft tissue swelling over the left eye without fracture.  Air-fluid level right maxillary sinus which could contain blood or infection.  No associated fracture.  CT CERVICAL SPINE  Findings:   ACDF C4-5 and C5-6.  There is pseudoarthrosis at both levels with persistent lucency through the disc space.  Advanced disc degeneration and spondylosis C3-4 and C6-7.  3 mm anterior slip C7-T1.  Advanced degenerative change C1-C2 with cystic changes in the dens and thickened transverse ligament of C1. Diffuse facet degeneration.  Negative for fracture.  Atherosclerotic carotid arteries.  IMPRESSION: Negative for fracture  ACDF with pseudoarthrosis C4-5 and C5-6.  Diffuse cervical spondylosis.   Original Report Authenticated By: Janeece Riggers, M.D.    Ct Maxillofacial Wo Cm  12/11/2012  *RADIOLOGY REPORT*  Clinical Data:  Loss of consciousness.  Hit head.  Head injury.  CT HEAD WITHOUT CONTRAST CT MAXILLOFACIAL WITHOUT CONTRAST CT CERVICAL SPINE WITHOUT CONTRAST  Technique:  Multidetector CT imaging of the head, cervical spine, and maxillofacial structures were performed using the standard protocol without intravenous contrast. Multiplanar CT image reconstructions of the cervical spine and maxillofacial structures were also generated.  Comparison:   None  CT HEAD  Findings: Generalized atrophy.  Mild chronic microvascular ischemic change in the white matter.  No acute infarct.  Negative for hemorrhage or mass.  Soft tissue swelling and hematoma overlying the left eye.  Negative for skull fracture.  There is an air-fluid level in the right maxillary sinus.  IMPRESSION: Atrophy and chronic microvascular ischemia.  No acute abnormality  CT MAXILLOFACIAL  Findings:  Moderate soft tissue swelling overlying the left eye without fracture.  No orbital fracture is identified on the left.  There is mucosal edema and an air-fluid level in the right maxillary sinus.  Fluid is relatively high density suggesting that this may be blood however I see no associated acute fracture.  This could also be due to infection.  Question swelling or trauma to the right orbit.  Negative for facial fracture.  The mandible is intact without fracture.  Zygomatic arches intact.  No fracture of the nasal bones  or nasal septum.  IMPRESSION: Soft tissue swelling over the left eye without fracture.  Air-fluid level right maxillary sinus which could contain blood or infection.  No associated fracture.  CT CERVICAL SPINE  Findings:   ACDF C4-5 and C5-6.  There is pseudoarthrosis at both levels with persistent lucency through the disc space.  Advanced disc degeneration and spondylosis C3-4 and C6-7.  3 mm anterior slip C7-T1.  Advanced degenerative change C1-C2 with cystic changes in the dens and thickened transverse ligament of C1. Diffuse facet degeneration.  Negative for fracture.  Atherosclerotic carotid arteries.  IMPRESSION: Negative for fracture  ACDF with pseudoarthrosis C4-5 and C5-6.  Diffuse cervical spondylosis.   Original Report Authenticated By: Janeece Riggers, M.D.     Images viewed by me.   Date: 12/11/2012  Rate: 71  Rhythm: normal sinus rhythm and premature ventricular contractions (PVC)  QRS Axis: left  Intervals: PR prolonged  ST/T Wave abnormalities: normal  Conduction  Disutrbances:first-degree A-V block  and Incomplete left bundle branch block  Narrative Interpretation:   Old EKG Reviewed: unchanged    1. Syncope   2. Exertional dyspnea   3. Anemia   4. Laceration of periorbital area, initial encounter   5. Periorbital ecchymosis, left, initial encounter       MDM  Syncope which is worrisome. He had no prodrome other than dyspnea with exertion. ECG is unchanged the cardiac markers will be checked. CT will be obtained of head, face, neck. He'll need to be admitted for observation. His laceration should be able to be closed with Dermabond.  Case is discussed with Dr. Janna Arch who agrees to admit the patient.   I personally performed the services described in this documentation, which was scribed in my presence. The recorded information has been reviewed and is accurate.     Dione Booze, MD 12/11/12 [phone]

## 2012-12-11 NOTE — Progress Notes (Signed)
ANTICOAGULATION CONSULT NOTE - Initial Consult  Pharmacy Consult for Lovenox Indication: VTE prophylaxis  Allergies  Allergen Reactions  . Propylene Glycol Rash    Patient Measurements: Height: 5\' 10"  (177.8 cm) Weight: 194 lb 0.1 oz (88 kg) IBW/kg (Calculated) : 73  Vital Signs: Temp: 98 F (36.7 C) (04/23 2210) Temp src: Oral (04/23 2210) BP: 149/60 mmHg (04/23 2145) Pulse Rate: 78 (04/23 2145)  Labs:  Recent Labs  12/11/12 1707  HGB 12.0*  HCT 36.8*  PLT 164  CREATININE 1.15    Estimated Creatinine Clearance: 58.2 ml/min (by C-G formula based on Cr of 1.15).   Medical History: Past Medical History  Diagnosis Date  . Hyperlipidemia   . Benign prostatic hypertrophy   . Carotid artery disease     Status post left carotid endarterectomy / Dr.   Myra Gianotti   . CAD (coronary artery disease)     DES, SVG to circumflex marginal, October, 2010 ( SVG to PDA and PLA patent , LIMA to LAD patent, EF 60%, mild inferior hypokinesis  . Syncope     Remote, etiology unknown  . Psoriasis     severe; with total skin exfoliation in the past resolved  . Systolic murmur   . Ejection fraction     EF 60% cardiac catheterization October, 2010  . Fluid overload     Mild,prn diuretic  . Hx of decompressive lumbar laminectomy     Dr.Botero December, 2011  . Hx of CABG   . Aortic stenosis     Mild, echo, August, 2012  . Preop cardiovascular exam     Preop clearance for knee surgery, August, 2013  . HTN (hypertension)     takes meds daily  . Hypothyroidism   . History of kidney stones   . GERD (gastroesophageal reflux disease)     takes daily  . Hx of dizziness   . Arthritis   . Nephrolithiasis   . Neuromuscular disorder     Medications:  Scheduled:  . [START ON 12/12/2012] allopurinol  600 mg Oral Daily  . [START ON 12/12/2012] aspirin EC  81 mg Oral Daily  . atorvastatin  20 mg Oral q1800  . [START ON 12/12/2012] clopidogrel  75 mg Oral Q breakfast  . enoxaparin  (LOVENOX) injection  40 mg Subcutaneous QHS  . [START ON 12/12/2012] irbesartan  150 mg Oral Daily  . [START ON 12/12/2012] levothyroxine  125 mcg Oral QAC breakfast  . [START ON 12/12/2012] metoprolol succinate  150 mg Oral Daily  . [START ON 12/12/2012] pantoprazole  40 mg Oral Daily  . [COMPLETED] potassium chloride SA  40 mEq Oral Once  . [START ON 12/12/2012] pyridOXINE  100 mg Oral Daily  . [START ON 12/12/2012] tamsulosin  0.4 mg Oral Daily  . [START ON 12/12/2012] testosterone cypionate  70 mg Intramuscular Q14 Days    Assessment: 79 yo M admitted with syncope.  PE was ruled out.  No hx bleeding noted. Renal function is at patient's baseline.    Goal of Therapy:  Monitor platelets by anticoagulation protocol: Yes   Plan:  Lovenox 40mg  sq daily Pharmacy to sign off.   Elson Clan 12/11/2012,10:50 PM

## 2012-12-11 NOTE — ED Notes (Signed)
Backboard removed, C-collar remains in place, pt denies pain/pint tenderness to back, denies pain anywhere except for over lt eye, lac above lt eye bandaged, bleeding controlled, lt eye black/blue, swollen.

## 2012-12-11 NOTE — ED Notes (Signed)
Syncopal episode prior to arrival, passed out and hit the left side of face on concrete, laceration bandaged  Prior to arrival

## 2012-12-11 NOTE — ED Notes (Signed)
Patient would like something to eat RN made aware. 

## 2012-12-11 NOTE — ED Notes (Signed)
MD at bedside. Dr. Maximino Greenland

## 2012-12-11 NOTE — ED Notes (Signed)
Pt A&Ox3 answering questions appropriately.

## 2012-12-12 ENCOUNTER — Inpatient Hospital Stay (HOSPITAL_COMMUNITY): Payer: Medicare Other

## 2012-12-12 DIAGNOSIS — I359 Nonrheumatic aortic valve disorder, unspecified: Secondary | ICD-10-CM

## 2012-12-12 DIAGNOSIS — R55 Syncope and collapse: Secondary | ICD-10-CM

## 2012-12-12 DIAGNOSIS — I2510 Atherosclerotic heart disease of native coronary artery without angina pectoris: Secondary | ICD-10-CM

## 2012-12-12 LAB — BASIC METABOLIC PANEL
BUN: 16 mg/dL (ref 6–23)
Calcium: 9.1 mg/dL (ref 8.4–10.5)
Creatinine, Ser: 1.06 mg/dL (ref 0.50–1.35)
GFR calc non Af Amer: 65 mL/min — ABNORMAL LOW (ref >90)
Glucose, Bld: 108 mg/dL — ABNORMAL HIGH (ref 70–99)

## 2012-12-12 LAB — HEPATIC FUNCTION PANEL
ALT: 32 U/L (ref 0–53)
AST: 33 U/L (ref 0–37)
Albumin: 3.5 g/dL (ref 3.5–5.2)
Alkaline Phosphatase: 93 U/L (ref 39–117)
Total Bilirubin: 0.7 mg/dL (ref 0.3–1.2)

## 2012-12-12 MED ORDER — ASPIRIN 81 MG PO CHEW
81.0000 mg | CHEWABLE_TABLET | Freq: Every day | ORAL | Status: DC
  Administered 2012-12-12 – 2012-12-13 (×2): 81 mg via ORAL
  Filled 2012-12-12 (×2): qty 1

## 2012-12-12 MED ORDER — FAMOTIDINE 20 MG PO TABS
20.0000 mg | ORAL_TABLET | Freq: Every day | ORAL | Status: DC
  Administered 2012-12-12 – 2012-12-13 (×2): 20 mg via ORAL
  Filled 2012-12-12 (×2): qty 1

## 2012-12-12 MED ORDER — MAGNESIUM SULFATE 40 MG/ML IJ SOLN
4.0000 g | Freq: Once | INTRAMUSCULAR | Status: AC
  Administered 2012-12-12: 4 g via INTRAVENOUS
  Filled 2012-12-12 (×2): qty 50

## 2012-12-12 MED ORDER — POTASSIUM CHLORIDE CRYS ER 20 MEQ PO TBCR
20.0000 meq | EXTENDED_RELEASE_TABLET | Freq: Three times a day (TID) | ORAL | Status: DC
  Administered 2012-12-12 – 2012-12-13 (×4): 20 meq via ORAL
  Filled 2012-12-12 (×4): qty 1

## 2012-12-12 NOTE — Progress Notes (Addendum)
Patient had couple episodes tonight of coughing up blood tinged sputum; patient states it is coming from mouth ?injury to mouth

## 2012-12-12 NOTE — Progress Notes (Signed)
[name], [name]               ACCOUNT NO.:  [id number]  MEDICAL RECORD NO.:  [id number]  LOCATION:  IC02                          FACILITY:  [facility]  PHYSICIAN:  Kingsley Callander. [name], MD       DATE OF BIRTH:  [birth year]  DATE OF PROCEDURE:  12/12/2012 DATE OF DISCHARGE:                                PROGRESS NOTE   HISTORY:  [name] was admitted last night after experiencing a syncopal episode.  He fell at the [employer] after walking from the car shed to the clubhouse, he had finished 18 holes.  He lost consciousness without any significant prodromal symptoms.  There was no reported seizure activity.  He did involuntarily urinate.  He did not bite his tongue.  He denies having any chest pain.  He questions whether he may have had some mild dyspnea.  He underwent a CT scan of the brain which revealed no evidence of acute stroke.  He underwent a CT scan of the chest which revealed no evidence of pulmonary embolus.  He did have a mildly elevated D-dimer.  He feels fine this morning.  He suffered a soft tissue injury to his left facial area, but fortunately had no sign of fracture.  PHYSICAL EXAMINATION:  VITAL SIGNS:  Temperature 97.9, pulse 67, respirations 18, blood pressure 131/64. LUNGS:  Clear. HEART:  Regular with a grade 2 systolic murmur. ABDOMEN:  Nondistended. EXTREMITIES:  No calf tenderness or edema. NEUROLOGIC:  Intact.  IMPRESSION/PLAN: 1. Syncope.  His EKG this morning is changed.  He is in normal sinus     rhythm, but now has inverted T-waves inferiorly. 2. Hypokalemia.  Serum potassium was 3.2 initially and is up to 3.4     today.  We will provide additional potassium supplements. 3. Coronary heart disease status post bypass surgery. 4. History of hyponatremia/syndrome of inappropriate antidiuretic     hormone secretion.  Serum sodium is 140. 5. Hypertension. 6. Mild aortic stenosis. 7. Hyperthyroidism.  He has been euthyroid on his current     levothyroxine  dose. 8. History of carotid artery disease status post left carotid     endarterectomy.     Kingsley Callander. [name], MD     ROF/MEDQ  D:  12/12/2012  T:  12/12/2012  Job:  [id number]

## 2012-12-12 NOTE — Progress Notes (Deleted)
Upon emptying urinal for patient a large sized blood clot was found in urine. Patient has no complaints of pain or otherwise at this time. Will continue to monitor patient at this time.

## 2012-12-12 NOTE — H&P (Signed)
NAMETRAQUAN, DUARTE               ACCOUNT NO.:  [id number]  MEDICAL RECORD NO.:  [id number]  LOCATION:  IC02                          FACILITY:  [facility]  PHYSICIAN:  Melvyn Novas, MDDATE OF BIRTH:  1931/03/17  DATE OF ADMISSION:  12/11/2012 DATE OF DISCHARGE:  LH                             HISTORY & PHYSICAL   HISTORY OF PRESENT ILLNESS:  The patient is a 79 year old, white male, patient of Dr. [name] who has diffuse vascular disease, status post CABG x3, status post left carotid endarterectomy, history of hypertension, hyperlipidemia, who golfed 18 holes and experienced no symptomatology, was walking back after golf, slightly up hill and had significant dyspnea out of proportion to what his active was and then had a sudden syncopal episode with hematoma of the left periorbital area.  The patient does not remember hitting the ground.  Concerns are that he has ischemic mediated ventricular dysrhythmia as the cause of syncope, however, other possibilities obviously exist.  His initial cardiac enzymes are negative, and he will be placed in ICU to rule out any serial cardiac markers and any potential dysrhythmias.  He had a low potassium at 3.2.  This is being rectified orally in the ER.  He denies any anginal chest pain, orthopnea, PND, dyspnea on exertion in the previous several weeks.  PAST MEDICAL HISTORY:  Significant for coronary artery disease, status post CABG, carotid artery disease, status post carotid endarterectomy, hypertension, hyperlipidemia, BPH, degenerative joint disease of the left knee, psoriasis, gout, and hypothyroidism.  CT scans of the patient's head and neck were essentially negative.  His D-dimer was mildly elevated, and CT angio of the chest was negative for pulmonary emboli.  PAST SURGICAL HISTORY:  Remarkable for coronary artery bypass x3, a left carotid endarterectomy, appendectomy, left knee total knee replacement, and a stent to either one  of his native coronary vessels or bypass grafts, not certain.  He has no known allergies.  SOCIAL HISTORY:  He has never smoked.  He is retired.  He is married, lives with his wife.  He is physically active around the house.  Imbibes alcohol occasionally.  CURRENT MEDICINES: 1. Flomax 0.4 p.o. daily. 2. Allopurinol 300 mg p.o. daily. 3. Aspirin 81 p.o. daily. 4. Lipitor 20 p.o. daily. 5. Plavix 75 p.o. daily. 6. Synthroid 125 mcg daily. 7. Toprol-XL 100 mg p.o. daily. 8. Sublingual nitroglycerin 0.4 p.r.n. 9. Prilosec 20 mg p.o. daily. 10.Vitamin B6 100 mg p.o. daily. 11.Testosterone cypionate 70 mg injection every 2 weeks. 12.Diovan 320 mg p.o. daily.  REVIEW OF SYSTEMS:  Negative for seizures, tremors, polyuria, polydipsia, nausea, vomiting, melena, hematemesis, or hematochezia.  PHYSICAL EXAMINATION:  VITAL SIGNS:  Blood pressure is 153/72, temperature 97.8, pulse 76 and regular, respiratory rate is 20, O2 saturation 98%. HEENT:  Diffuse periorbital hematoma.  No orbital fracture, no fractures of the zygomatic arch per CT.  Extraocular movements intact.  Sclerae clear.  Conjunctivae pink.  No visual disturbances. NECK:  No carotid bruits.  No thyromegaly.  No thyroid bruits. LUNGS:  Diminished breath sounds at the bases.  No rales, wheeze, or rhonchi appreciable.  No wheezed auscultated. HEART:  Regular rhythm.  No S3, S4 auscultated.  No heaves, thrills, or rubs. ABDOMEN:  Soft, nontender.  Bowel sounds normoactive.  No guarding, rebound, mass, or megaly. EXTREMITIES:  No clubbing, cyanosis, or edema. NEUROLOGIC:  The patient alert and oriented.  An year old healed cranial nerves grossly intact, III through XII.  The patient moves all 4 extremities.  IMPRESSION: 1. Syncope preceded by intense dyspnea on exertion. 2. Coronary artery disease, status post coronary artery bypass graft     x3. 3. Hypokalemia with potassium of 3.2. 4. Hypertension. 5. Carotid artery  disease, status post carotid endarterectomy. 6. Benign prostatic hyperplasia. 7. Degenerative joint disease of the left knee. 8. Cervical fusion of his neck. 9. Gout.  PLAN:  At present is to put in ICU, serial cardiac enzymes.  Monitor for ventricular dysrhythmias.  Observe hemodynamics.  Obtain 12-lead cardiogram in a.m.  Carotid ultrasounds.  Duplex scan ordered for a.m. We will make further recommendations as the database expands.     Melvyn Novas, MD     RMD/MEDQ  D:  12/11/2012  T:  12/12/2012  Job:  [id number]

## 2012-12-12 NOTE — Progress Notes (Signed)
UR chart review completed.  

## 2012-12-12 NOTE — Progress Notes (Signed)
*  PRELIMINARY RESULTS* Echocardiogram 2D Echocardiogram has been performed.  [name] 12/12/2012, 2:21 PM  [employer], RDCS

## 2012-12-12 NOTE — Progress Notes (Signed)
Pt to be transferred to room 324 per MD order. Report called to RN. Pt transferred via wheelchair with personal belongings. Family at bedside.

## 2012-12-12 NOTE — Consult Note (Addendum)
CARDIOLOGY CONSULT NOTE  Patient ID: [name] MRN: [id number] DOB/AGE: Sep 04, 1930 79 y.o.  Admit date: 12/11/2012 Referring Physician: Gerarda Gunther, MD Primary Cardiologist: Jerral Bonito MD ([place] office) Reason for Consultation: Syncope, ASCVD-prior CABG  Active Problems:   Syncope and collapse   HTN (hypertension)   CAD (coronary artery disease)   Carotid artery disease   Hyperlipidemia   Aortic stenosis  HPI: [name] is a very pleasant 79 year old male patient admitted with syncopal episode. The patient had played 18 holes of golf without incident, was on his way home when he realized he forgot his cell phone and returned to where the golf carts had been parked. On his way back from checking the golf carts he will walk up a small incline. He states he had a "funny feeling" described as profound fatigue, without chest pain palpitations or dizziness. He continued to walk up the hill and had sudden onset of syncope.  He awoke after EMS arrived on the scene. He was completely coherent when he awoke without evidence of focal neuro deficits, he was alert oriented and was able to answer questions appropriately. He sustained a laceration and contusion of his left periorbital area.    Patient has a known history of coronary artery disease with coronary artery bypass grafting in 1995 (LIMA-LAD, SVG to PDA, and PL, SVG to CX); status post PCI of the vein graft to the left circumflex in 2010, carotid artery disease with most recent Doppler study completed in May of 2012 revealing a 40-59 stenosis of the RICA and 20-39% stenosis of the LICA status post left carotid endarterectomy; and aortic valve stenosis with aortic valve per echo in 2012 demonstrating mild stenosis with a gradient of 11 mm of mercury systolic with a valve area 1.19 cm by VTI and 1.1 cm square by Vmax. LVEF at that time was 55%. With normal wall motion. He was also noted to have some mild mitral regurg.    On arrival to the emergency room the patient's blood pressure is 153/72 with heart rate 75 respirations 18 with an O2 sat of 96%. He was found to be hypokalemic with potassium of 3.2; sodium 138 creatinine 1.1 Hemoglobin is 12.0 with hematocrit of 36.8. Cardiac enzymes found at the negative at 0.02 with an elevated d-dimer 1.86. CT scan of the chest ruled out pulmonary embolus. CT scan of the head was negative for acute intracranial abnormality. Chest x-ray revealed cardiomegaly with increased vascular congestion and interstitial edema. Pro BNP was elevated at 1139.  Review of systems complete and found to be negative unless listed above  Past Medical History  Diagnosis Date  . Hyperlipidemia   . Benign prostatic hypertrophy   . Carotid artery disease     DES, SVG to circumflex marginal, October, 2010 ( SVG to PDA and PLA patent , LIMA to LAD patent, EF 60%, mild inferior hypokinesis; H/o mild CHF, CABG  . CAD (coronary artery disease)     DES, SVG to circumflex marginal, October, 2010 ( SVG to PDA and PLA patent , LIMA to LAD patent, EF 60%, mild inferior hypokinesis  . Syncope     Remote, etiology unknown  . Psoriasis     severe; with total skin exfoliation in the past resolved  . Hx of decompressive lumbar laminectomy     Dr.Botero December, 2011  . Aortic stenosis     Mild, echo, August, 2012  . HTN (hypertension)     takes meds daily  .  Hypothyroidism   . Nephrolithiasis   . GERD (gastroesophageal reflux disease)     takes daily  . Arthritis   . Neuromuscular disorder      Family History  Problem Relation Age of Onset  . Colon cancer Neg Hx   . Heart attack Mother   . Heart disease Father   . Heart attack Son     History   Social History  . Marital Status: Married    Spouse Name: N/A    Number of Children: N/A  . Years of Education: N/A   Occupational History  . Not on file.   Social History Main Topics  . Smoking status: Never Smoker   . Smokeless tobacco:  Never Used  . Alcohol Use: Yes     Comment: Occasional  . Drug Use: No  . Sexually Active: Not Currently   Other Topics Concern  . Not on file   Social History Narrative  . No narrative on file    Past Surgical History  Procedure Laterality Date  . Coronary artery bypass graft  1995  . Decompression of left median nerve    . Left carotid endarectomy and dacron patch angioplasty]    . Bilateral l3-4 laminectomies    . Removal of synovial cyst    . Posterior arthrodesis with autograft and allograft    . Colonoscopy  11/23/2011    Procedure: COLONOSCOPY;  Surgeon: Malissa Hippo, MD;  Location: [facility];  Service: Endoscopy;  Laterality: N/A;  730  . Carotid endarterectomy  2005    left  . Back surgery      x3  . Tonsillectomy    . Appendectomy    . Knee arthroplasty  04/16/2012    left total  . Total knee arthroplasty  04/16/2012    Procedure: TOTAL KNEE ARTHROPLASTY;  Surgeon: Valeria Batman, MD;  Location: [facility] OR;  Service: Orthopedics;  Laterality: Left;  Marland Kitchen Eye surgery      cataracts; bilateral  . Joint replacement  Sept. 27, 2013    Left Knee    Prescriptions prior to admission  Medication Sig Dispense Refill  . acetaminophen (TYLENOL) 500 MG tablet Take 1,000 mg by mouth every 6 (six) hours as needed for pain.      Marland Kitchen allopurinol (ZYLOPRIM) 300 MG tablet Take 600 mg by mouth daily.       Marland Kitchen aspirin EC 81 MG tablet Take 81 mg by mouth daily.      Marland Kitchen atorvastatin (LIPITOR) 20 MG tablet Take 20 mg by mouth every evening.       . clopidogrel (PLAVIX) 75 MG tablet Take 75 mg by mouth daily.      Marland Kitchen levothyroxine (SYNTHROID, LEVOTHROID) 125 MCG tablet Take 125 mcg by mouth daily.       . metoprolol (TOPROL-XL) 100 MG 24 hr tablet Take 150 mg by mouth daily.       . multivitamin (THERAGRAN) per tablet Take 1 tablet by mouth daily.       . nitroGLYCERIN (NITROSTAT) 0.4 MG SL tablet Place 0.4 mg under the tongue every 5 (five) minutes x 3 doses as needed. For chest pain       . omeprazole (PRILOSEC) 20 MG capsule Take 20 mg by mouth daily.       Marland Kitchen pyridOXINE (VITAMIN B-6) 100 MG tablet Take 100 mg by mouth daily.       . Tamsulosin HCl (FLOMAX) 0.4 MG CAPS Take 0.4 mg by mouth daily.       Marland Kitchen  testosterone cypionate (DEPOTESTOTERONE CYPIONATE) 200 MG/ML injection Inject 70 mg into the muscle every 14 (fourteen) days. Patient is only  Taking 70 mg      . valsartan (DIOVAN) 320 MG tablet Take 320 mg by mouth daily.        Physical Exam: Blood pressure 131/64, pulse 67, temperature 97.9 F (36.6 C), temperature source Oral, resp. rate 18, height 5\' 10"  (1.778 m), weight 197 lb 12 oz (89.7 kg), SpO2 94.00%.   General: Well developed, well nourished, in no acute distress Head: Eyes PERRLA, No xanthomas. Laceration and contusion to the left periorbital area.   Normal cephalic and atramatic  Lungs: Mild bibasilar crackles, without wheezes. Heart: HRRR normal S1; reduced intensity of S2, 2/6 systolic ejection murmur, heard loudest at the RSB but also at the LSB and apex and transmitted to the carotids.  Pulses are 2+ & equal, but carotid upstroke is somewhat delayed.  No JVD.  No abdominal bruits. No femoral bruits. Abdomen: Bowel sounds are positive, abdomen soft and non-tender without masses Msk:  Back normal, normal gait. Normal strength and tone for age. Extremities: No clubbing, cyanosis 1+ ankles edema.  DP +1 Neuro: Alert and oriented X 3.No focal deficits. Psych:  Good affect, responds appropriately   Lab Results  Component Value Date   WBC 11.7* 12/11/2012   HGB 12.0* 12/11/2012   HCT 36.8* 12/11/2012   MCV 82.0 12/11/2012   PLT 164 12/11/2012    Recent Labs Lab 12/12/12 0404  NA 140  K 3.4*  CL 102  CO2 29  BUN 16  CREATININE 1.06  CALCIUM 9.1  PROT 6.4  BILITOT 0.7  ALKPHOS 93  ALT 32  AST 33  GLUCOSE 108*   Lab Results  Component Value Date   TROPONINI <0.30 12/12/2012    BNP (last 3 results)  Recent Labs  12/11/12 1707  PROBNP 1139.0*    Radiology: Dg Chest  12/11/2012   Cardiomegaly with increased vascular congestion/interstitial edema. Less likely the appearance could reflect viral / atypical respiratory infection.   Ct Angio Chest   12/11/2012  Soft tissue swelling over the left eye without fracture.  Air-fluid level right maxillary sinus which could contain blood or infection.  No associated fracture.    CT CERVICAL SPINE : Negative for fracture  ACDF with pseudoarthrosis C4-5 and C5-6.  Diffuse cervical spondylosis.    EKG: 12/11/12  Normal sinus rhythm with PVCs, first degree AV block, incomplete left bundle branch block. Rate of 71 beats per minute; possible left atrial abnormality; voltage criteria for LVH.      12/12/2012: Normal sinus rhythm at a rate of 68 bpm; first-degree AV block, incomplete right bundle branch block, right axis deviation, prominent QRS altered, new inferior T wave inversion in leads 3 and aVF, possibly resulting from change in conduction.  ASSESSMENT AND PLAN:   1. Syncopal Episode: associated with ypokalemia and  Hypomagnesemia.  EKG is also abnormal with T-wave inversion noted in the inferior leads which is new this morning from initial EKG on admission. Cardiac markers negative.  Creatinine of 1.15-> IV fluid hydration. Blood pressure is stable. No evidence on CT scan acute intracranial abnormalities.  2. CAD: Prior three-vessel CABG in 1995, with drug-eluting stent to the SVG to circumflex in 2012, with no further cardiac testing completed since that time as he had been asymptomatic. EKG is abnormal this a.m. with T-wave inversion noted anteriorly with ST depression noted in lead 2. The patient continues on Plavix and  ASA, BB.  We will repeat echocardiogram.  3. CHF: Likely diastolic, with elevated proBNP of 1139. He does have evidence of edema in the lower extremities. We will place on Lasix 20 mg daily. Continue with potassium replacement. Monitor BMET closely.   4. Hypokalemia: Potassium  was 3.2 on admission this a.m. 3.4. Secondary to diuretics. We will increase potassium dose,  5. Hypomagnesemia: Low at 1.6 on admission. He is on a PPI, Protonix, which is known to cause hypomagnesemia. Will DC this and begin him on Pepcid 20 mg daily. Replete magnesium.  6. Carotid artery disease: He is followed by [facility] with most recent carotid ultrasound completed in October of 2013 demonstrating less than 39% right internal carotid artery stenosis, widely patent Left Carotid  endarterectomy with evidence of ess than 39% hyperplasia.  7. Aortic valve stenosis: Most recent echocardiogram completed in August of 2012 demonstrating normal LVEF of 55% no wall motion abnormalities, aortic valve revealed moderately calcified annulus probably trileaflet with moderately calcified leaflets separation was reduced with mild stenosis mean gradient of 11 mm of Hg. valve area was 1.19 cm by VTI and 1.1 cm square by Vmax. Echo is being repeated.   8.  Hypothyroidism: Check TSH. Continue levothyroxine 125 mcg daily.  9. Hypertension: Blood pressure is currently well controlled. Review home medications has him on Avapro 150 mg daily , and beta blocker 150 mg daily.  10. Hypercholesterolemia: Currently on Lipitor 20 mg daily. Fasting lipids and LFTs for continued risk management will be ordered.  Bettey Mare. Lyman Bishop NP [facility] 12/12/2012, 8:27 AM  Cardiology Attending Patient interviewed and examined. Discussed with Joni Reining, NP.  Above note annotated and modified based upon my findings.  Patient developed syncope with exertion, but at a level of activity less and yet maintain most of the morning. Presence of pulmonary edema on admission chest x-ray and moderately elevated BNP level suggests at least transient increase in left atrial pressure.  Left ventricular systolic function is normal, and aortic valve stenosis appears only mild by echocardiography. This suggests either transient ischemia  or arrhythmia as the most likely causes of loss of consciousness.  Alternation between incomplete right and left bundle branch blocks as well as presence of first-degree AV block and presence of conduction system disease and the possibility of a bradycardia arrhythmia Patient can be discharged with plans for an outpatient stress nuclear study next week and possibly event recording. Restricted physical activity advised until evaluation has been completed.  Kemmerer Bing, MD 12/13/2012, 9:24 AM

## 2012-12-12 NOTE — Progress Notes (Signed)
During the night when patient was sleeping he was having short periods of apnea where his oxygen sats would decrease into the mid to high 80s. Placed patient on 2L [place].

## 2012-12-12 NOTE — Care Management Note (Unsigned)
    Page 1 of 1   12/12/2012     2:06:05 PM   CARE MANAGEMENT NOTE 12/12/2012  Patient:  [name], [name]   Account Number:  [id number]  Date Initiated:  12/12/2012  Documentation initiated by:  Sharrie Rothman  Subjective/Objective Assessment:   Pt admitted from home with syncope and shortness of breath. Pt lives with his wife and will return home at discharge. Pt is independent with ADL's.     Action/Plan:   No CM needs noted.   Anticipated DC Date:  12/13/2012   Anticipated DC Plan:  HOME/SELF CARE      DC Planning Services  CM consult      Choice offered to / List presented to:             Status of service:  Completed, signed off Medicare Important Message given?   (If response is "NO", the following Medicare IM given date fields will be blank) Date Medicare IM given:   Date Additional Medicare IM given:    Discharge Disposition:    Per UR Regulation:    If discussed at Long Length of Stay Meetings, dates discussed:    Comments:  12/12/12 1405 Arlyss Queen, RN BSN CM

## 2012-12-13 ENCOUNTER — Encounter (HOSPITAL_COMMUNITY): Payer: Self-pay | Admitting: Cardiology

## 2012-12-13 LAB — HEPATIC FUNCTION PANEL
Bilirubin, Direct: 0.1 mg/dL (ref 0.0–0.3)
Indirect Bilirubin: 0.7 mg/dL (ref 0.3–0.9)
Total Bilirubin: 0.8 mg/dL (ref 0.3–1.2)

## 2012-12-13 LAB — MAGNESIUM: Magnesium: 2.1 mg/dL (ref 1.5–2.5)

## 2012-12-13 LAB — BASIC METABOLIC PANEL
BUN: 15 mg/dL (ref 6–23)
Calcium: 8.9 mg/dL (ref 8.4–10.5)
Creatinine, Ser: 1.07 mg/dL (ref 0.50–1.35)
GFR calc Af Amer: 74 mL/min — ABNORMAL LOW (ref >90)
GFR calc non Af Amer: 64 mL/min — ABNORMAL LOW (ref >90)
Glucose, Bld: 106 mg/dL — ABNORMAL HIGH (ref 70–99)

## 2012-12-13 NOTE — Progress Notes (Signed)
Patient discharged home with wife.  IV removed - WNL. No changes to medications made.  Instructed on FU appts and stress test date.  Instructed not to eat or drink anything prior to stress test after midnight.  No questions at this time.  Stable to discharge home.

## 2012-12-15 NOTE — Discharge Summary (Signed)
NAMEJANIEL, CRISOSTOMO               ACCOUNT NO.:  [id number]  MEDICAL RECORD NO.:  [id number]  LOCATION:  A324                          FACILITY:  [facility]  PHYSICIAN:  Kingsley Callander. [name], MD       DATE OF BIRTH:  08-Jan-1931  DATE OF ADMISSION:  12/11/2012 DATE OF DISCHARGE:  04/25/2014LH                              DISCHARGE SUMMARY   DISCHARGE DIAGNOSES: 1. Syncope. 2. Coronary artery disease. 3. Aortic stenosis. 4. Hypertension. 5. Hypothyroidism. 6. Carotid artery disease.  HOSPITAL COURSE:  This patient is a 79 year old male who presented after passing out at the [employer] following 18 holes of golf.  On presentation to the emergency room, he was neurologically intact and in normal sinus rhythm.  He had a mildly elevated D-dimer on initial laboratory testing and underwent a CT scan of the chest, which revealed no evidence of pulmonary embolus.  He had a CT scan of the brain, which revealed no evidence of acute stroke.  He was hospitalized in a monitored setting.  He had no further syncopal episodes.  He remained in sinus rhythm.  He was noted to have inferior T-wave inversions and an incomplete right bundle-branch block and change of his EKG pattern.  He was seen in Cardiology consultation by Dr. Dietrich Pates.  He underwent an echocardiogram, which revealed stable findings regarding his mild-to- moderate aortic stenosis.  Arrangements were made for a stress test as an outpatient following discharge.  The patient was treated for mild hypokalemia.  His potassium was 3.2 initially and normalized with supplementation.  He was also treated with supplemental magnesium.  His magnesium levels were in the normal range.  His hypothyroidism is stable.  His TSH is in the normal range.  He was improved and stable for discharge on the 25th.  He will be seen in followup in 1 week.  DISCHARGE MEDICATIONS: 1. Acetaminophen 1000 mg q.6 p.r.n. 2. Allopurinol 600 mg daily. 3. Aspirin 81 mg  daily. 4. Lipitor 20 mg daily. 5. Plavix 75 mg daily. 6. Synthroid 125 mcg daily. 7. Toprol-XL 150 mg daily. 8. Multivitamin daily. 9. Sublingual nitroglycerin p.r.n. 10.Prilosec 20 mg daily. 11.Vitamin B6 100 mg daily. 12.Flomax 0.4 mg daily. 13.Testosterone 70 mg q.14 days. 14.Diovan 320 mg daily.     Kingsley Callander. [name], MD     ROF/MEDQ  D:  12/14/2012  T:  12/15/2012  Job:  [id number]

## 2012-12-19 ENCOUNTER — Other Ambulatory Visit: Payer: Self-pay | Admitting: *Deleted

## 2012-12-19 DIAGNOSIS — R079 Chest pain, unspecified: Secondary | ICD-10-CM

## 2012-12-20 ENCOUNTER — Encounter (HOSPITAL_COMMUNITY)
Admission: RE | Admit: 2012-12-20 | Discharge: 2012-12-20 | Disposition: A | Discharge status: Home / Self Care | Payer: Medicare Other | Source: Ambulatory Visit | Attending: Cardiology | Admitting: Cardiology

## 2012-12-20 ENCOUNTER — Encounter (HOSPITAL_COMMUNITY): Payer: Self-pay | Admitting: Cardiology

## 2012-12-20 ENCOUNTER — Other Ambulatory Visit: Payer: Self-pay | Admitting: *Deleted

## 2012-12-20 ENCOUNTER — Encounter (HOSPITAL_COMMUNITY): Payer: Self-pay

## 2012-12-20 DIAGNOSIS — R55 Syncope and collapse: Secondary | ICD-10-CM | POA: Insufficient documentation

## 2012-12-20 DIAGNOSIS — I2510 Atherosclerotic heart disease of native coronary artery without angina pectoris: Secondary | ICD-10-CM

## 2012-12-20 DIAGNOSIS — R079 Chest pain, unspecified: Secondary | ICD-10-CM

## 2012-12-20 DIAGNOSIS — I4949 Other premature depolarization: Secondary | ICD-10-CM

## 2012-12-20 DIAGNOSIS — I493 Ventricular premature depolarization: Secondary | ICD-10-CM

## 2012-12-20 DIAGNOSIS — Z951 Presence of aortocoronary bypass graft: Secondary | ICD-10-CM | POA: Insufficient documentation

## 2012-12-20 MED ORDER — TECHNETIUM TC 99M SESTAMIBI - CARDIOLITE
10.0000 | Freq: Once | INTRAVENOUS | Status: AC | PRN
  Administered 2012-12-20: 09:00:00 10.5 via INTRAVENOUS

## 2012-12-20 MED ORDER — REGADENOSON 0.4 MG/5ML IV SOLN
INTRAVENOUS | Status: AC
  Filled 2012-12-20: qty 5

## 2012-12-20 MED ORDER — TECHNETIUM TC 99M SESTAMIBI - CARDIOLITE
30.0000 | Freq: Once | INTRAVENOUS | Status: AC | PRN
  Administered 2012-12-20: 30 via INTRAVENOUS

## 2012-12-20 MED ORDER — SODIUM CHLORIDE 0.9 % IJ SOLN
INTRAMUSCULAR | Status: AC
  Administered 2012-12-20: 10 mL via INTRAVENOUS
  Filled 2012-12-20: qty 10

## 2012-12-20 NOTE — Progress Notes (Addendum)
Stress Lab Nurses Notes - [name]  [name] 12/20/2012 Reason for doing test: CAD and Syncope Type of test: Stress Cardiolite Nurse performing test: Parke Poisson, RN Nuclear Medicine Tech: Lou Cal Echo Tech: Not Applicable MD performing test: Ival Bible & Joni Reining NP Family MD: [name] Test explained and consent signed: yes IV started: 22g jelco, Saline lock flushed, No redness or edema and Saline lock started in radiology Symptoms: Fatigue Treatment/Intervention: None Reason test stopped: fatigue After recovery IV was: Discontinued via X-ray tech and No redness or edema Patient to return to Nuc. Med at : 11:45 Patient discharged: Home Patient's Condition upon discharge was: stable Comments: During test peak BP 186/97 & HR 127 .  Recovery BP 161/94 & HR 92.  Symptoms resolved in recovery. Erskine Speed T

## 2012-12-23 ENCOUNTER — Encounter: Payer: Medicare Other | Admitting: Adult Health

## 2012-12-24 ENCOUNTER — Encounter: Payer: Self-pay | Admitting: Adult Health

## 2012-12-24 ENCOUNTER — Encounter: Payer: Self-pay | Admitting: *Deleted

## 2012-12-24 ENCOUNTER — Ambulatory Visit (INDEPENDENT_AMBULATORY_CARE_PROVIDER_SITE_OTHER): Payer: Medicare Other | Admitting: Adult Health

## 2012-12-24 ENCOUNTER — Ambulatory Visit (HOSPITAL_COMMUNITY)
Admission: RE | Admit: 2012-12-24 | Discharge: 2012-12-24 | Disposition: A | Discharge status: Home / Self Care | Payer: Medicare Other | Source: Ambulatory Visit | Attending: Adult Health | Admitting: Adult Health

## 2012-12-24 VITALS — BP 156/86 | HR 62 | Ht 70.0 in | Wt 192.0 lb

## 2012-12-24 DIAGNOSIS — I359 Nonrheumatic aortic valve disorder, unspecified: Secondary | ICD-10-CM

## 2012-12-24 DIAGNOSIS — I060 Rheumatic aortic stenosis: Secondary | ICD-10-CM

## 2012-12-24 DIAGNOSIS — I2510 Atherosclerotic heart disease of native coronary artery without angina pectoris: Secondary | ICD-10-CM

## 2012-12-24 DIAGNOSIS — R55 Syncope and collapse: Secondary | ICD-10-CM

## 2012-12-24 DIAGNOSIS — I10 Essential (primary) hypertension: Secondary | ICD-10-CM

## 2012-12-24 DIAGNOSIS — Z01818 Encounter for other preprocedural examination: Secondary | ICD-10-CM | POA: Insufficient documentation

## 2012-12-24 DIAGNOSIS — I350 Nonrheumatic aortic (valve) stenosis: Secondary | ICD-10-CM

## 2012-12-24 LAB — CBC WITH DIFFERENTIAL/PLATELET
Eosinophils Absolute: 0.2 10*3/uL (ref 0.0–0.7)
Hemoglobin: 12.7 g/dL — ABNORMAL LOW (ref 13.0–17.0)
Lymphs Abs: 1.4 10*3/uL (ref 0.7–4.0)
MCH: 26.4 pg (ref 26.0–34.0)
Neutro Abs: 5 10*3/uL (ref 1.7–7.7)
Neutrophils Relative %: 67 % (ref 43–77)
Platelets: 205 10*3/uL (ref 150–400)
RBC: 4.81 MIL/uL (ref 4.22–5.81)
WBC: 7.5 10*3/uL (ref 4.0–10.5)

## 2012-12-24 LAB — BASIC METABOLIC PANEL
BUN: 15 mg/dL (ref 6–23)
Calcium: 9.4 mg/dL (ref 8.4–10.5)
Chloride: 101 mEq/L (ref 96–112)
Creat: 1.01 mg/dL (ref 0.50–1.35)

## 2012-12-24 LAB — PROTIME-INR
INR: 1.08 (ref <1.50)
Prothrombin Time: 13.9 seconds (ref 11.6–15.2)

## 2012-12-24 NOTE — Patient Instructions (Addendum)
A chest x-ray takes a picture of the organs and structures inside the chest, including the heart, lungs, and blood vessels. This test can show several things, including, whether the heart is enlarges; whether fluid is building up in the lungs; and whether pacemaker / defibrillator leads are still in place. THIS WEEK BEFORE LEFT HEART CATH  Your physician has requested that you have a cardiac catheterization. Cardiac catheterization is used to diagnose and/or treat various heart conditions. Doctors may recommend this procedure for a number of different reasons. The most common reason is to evaluate chest pain. Chest pain can be a symptom of coronary artery disease (CAD), and cardiac catheterization can show whether plaque is narrowing or blocking your heart's arteries. This procedure is also used to evaluate the valves, as well as measure the blood flow and oxygen levels in different parts of your heart. For further information please visit [email or web address] Please follow instruction sheet, as given.  Your physician recommends that you HAVE for lab work THIS WEEK BEFORE LEFT HEART CATH ON Friday SCHEDULED AT 10:30 BUT HAVE TO ARRIVE AT 8:30 PT/INR/BMET/CBC Coronary Angiography Coronary angiography is an X-ray procedure used to look at the arteries in the heart. In this procedure, a dye is injected through a long, hollow tube (catheter). The catheter is about the size of a piece of cooked spaghetti. The catheter injects a dye into an artery in your groin. X-rays are then taken to show if there is a blockage in the arteries of your heart. BEFORE THE PROCEDURE   Let your caregiver know if you have allergies to shellfish or contrast dye. Also let your caregiver know if you have kidney problems or failure.  Do not eat or drink starting from midnight up to the time of the procedure, or as directed.  You may drink enough water to take your medications the morning of the procedure if you were instructed to do  so.  You should be at the hospital or outpatient facility where the procedure is to be done 60 minutes prior to the procedure or as directed. PROCEDURE  You may be given an IV medication to help you relax before the procedure.  You will be prepared for the procedure by washing and shaving the area where the catheter will be inserted. This is usually done in the groin but may be done in the fold of your arm by your elbow.  A medicine will be given to numb your groin where the catheter will be inserted.  A specially trained doctor will insert the catheter into an artery in your groin. The catheter is guided by using a special type of X-ray (fluoroscopy) to the blood vessel being examined.  A special dye is then injected into the catheter and X-rays are taken. The dye helps to show where any narrowing or blockages are located in the heart arteries. AFTER THE PROCEDURE   After the procedure you will be kept in bed lying flat for several hours. You will be instructed to not bend or cross your legs.  The groin insertion site will be watched and checked frequently.  The pulse in your feet will be checked frequently.  Additional blood tests, X-rays and an EKG may be done.  You may stay in the hospital overnight for observation. SEEK IMMEDIATE MEDICAL CARE IF:   You develop chest pain, shortness of breath, feel faint, or pass out.  There is bleeding, swelling, or drainage from the catheter insertion site.  You develop pain, discoloration,  coldness, or severe bruising in the leg or area where the catheter was inserted.  You have a fever. Document Released: 02/11/2003 Document Revised: 10/30/2011 Document Reviewed: 04/01/2008 [facility] Patient Information 2013 [place], [place].

## 2012-12-24 NOTE — Assessment & Plan Note (Signed)
The patient's stress Myoview was found to be abnormal with ST changes during exercise with nonsustained ventricular tachycardia, frequent PVCs with couplets. He also was found to have anterior apical and lateral ischemia. LVEF is 45% with global hypokinesis. I discussed this with Dr. Dietrich Pates and Dr. Myrtis Ser, Dr. Dietrich Pates is seen this patient with me and has agreed the patient needs to proceed with cardiac catheterization. Risks and benefits have been discussed with the patient who is verbalizes understanding and is willing to proceed. Catheterization is planned for 12/27/2012 at 10:30 at the main cath lab with Dr. Clifton James.

## 2012-12-24 NOTE — Progress Notes (Signed)
 HPI Mr. are low is a 79-year-old of Dr. Rothbart we are following for ongoing assessment and management of CAD, with history of syncope and aortic stenosis. He was recently admitted after passing out while playing [place]. He had followup stress test completed post hospitalization demonstrating abnormal exercise Myoview overall low to intermediate risk, abnormal ST segment changes noted during exercise with PVCs couplets and brief burst of NSVT. Perfusion imaging demonstrated apical inferior lateral ischemia and  component of scar. LVEF is 45% with global hypokinesis.  A cardiac monitor was placed on the patient but he was unable to wear it consistently as he could not remember how to take it on an off correctly. Baseline tracings for 24 hours revealed normal sinus rhythm without frequent arrhythmias.  Allergies  Allergen Reactions  . Propylene Glycol Rash    Current Outpatient Prescriptions  Medication Sig Dispense Refill  . acetaminophen (TYLENOL) 500 MG tablet Take 1,000 mg by mouth every 6 (six) hours as needed for pain.      . allopurinol (ZYLOPRIM) 300 MG tablet Take 600 mg by mouth daily.       . aspirin EC 81 MG tablet Take 81 mg by mouth daily.      . atorvastatin (LIPITOR) 20 MG tablet Take 20 mg by mouth every evening.       . clopidogrel (PLAVIX) 75 MG tablet Take 75 mg by mouth daily.      . levothyroxine (SYNTHROID, LEVOTHROID) 125 MCG tablet Take 125 mcg by mouth daily.       . metoprolol (TOPROL-XL) 100 MG 24 hr tablet Take 150 mg by mouth daily.       . multivitamin (THERAGRAN) per tablet Take 1 tablet by mouth daily.       . nitroGLYCERIN (NITROSTAT) 0.4 MG SL tablet Place 0.4 mg under the tongue every 5 (five) minutes x 3 doses as needed. For chest pain      . omeprazole (PRILOSEC) 20 MG capsule Take 20 mg by mouth daily.       . pyridOXINE (VITAMIN B-6) 100 MG tablet Take 100 mg by mouth daily.       . Tamsulosin HCl (FLOMAX) 0.4 MG CAPS Take 0.4 mg by  mouth daily.       . testosterone cypionate (DEPOTESTOTERONE CYPIONATE) 200 MG/ML injection Inject 70 mg into the muscle every 14 (fourteen) days. Patient is only  Taking 70 mg      . valsartan (DIOVAN) 320 MG tablet Take 320 mg by mouth daily.        No current facility-administered medications for this visit.    Past Medical History  Diagnosis Date  . Hyperlipidemia   . Benign prostatic hypertrophy   . Carotid artery disease     DES, SVG to circumflex marginal, October, 2010 ( SVG to PDA and PLA patent , LIMA to LAD patent, EF 60%, mild inferior hypokinesis; H/o mild CHF, CABG  . CAD (coronary artery disease)     DES, SVG to circumflex marginal, October, 2010 ( SVG to PDA and PLA patent , LIMA to LAD patent, EF 60%, mild inferior hypokinesis  . Syncope     Remote, etiology unknown  . Psoriasis     severe; with total skin exfoliation in the past resolved  . Hx of decompressive lumbar laminectomy     Dr.Botero December, 2011  . Aortic stenosis     Mild, echo, August, 2012  . HTN (hypertension)       takes meds daily  . Hypothyroidism   . Nephrolithiasis   . GERD (gastroesophageal reflux disease)     takes daily  . Arthritis   . Neuromuscular disorder     Past Surgical History  Procedure Laterality Date  . Coronary artery bypass graft  1995  . Decompression of left median nerve    . Left carotid endarectomy and dacron patch angioplasty]    . Bilateral l3-4 laminectomies    . Removal of synovial cyst    . Posterior arthrodesis with autograft and allograft    . Colonoscopy  11/23/2011    Procedure: COLONOSCOPY;  Surgeon: Najeeb U Rehman, MD;  Location: [facility];  Service: Endoscopy;  Laterality: N/A;  730  . Carotid endarterectomy  2005    left  . Back surgery      x3  . Tonsillectomy    . Appendectomy    . Knee arthroplasty  04/16/2012    left total  . Total knee arthroplasty  04/16/2012    Procedure: TOTAL KNEE ARTHROPLASTY;  Surgeon: Peter W Whitfield, MD;   Location: [facility] OR;  Service: Orthopedics;  Laterality: Left;  . Eye surgery      cataracts; bilateral  . Joint replacement  Sept. 27, 2013    Left Knee    ROS:Review of systems complete and found to be negative unless listed above PHYSICAL EXAM BP 156/86  Pulse 62  Ht 5' 10" (1.778 m)  Wt 192 lb (87.091 kg)  BMI 27.55 kg/m2  General: Well developed, well nourished, in no acute distress Head: Eyes PERRLA, No xanthomas.   Normal cephalic and atramatic  Lungs: Clear bilaterally to auscultation and percussion. Heart: HRRR S1 S2, with 2/6 systolic murmur,  Pulses are 2+ & equal.            No carotid bruit. No JVD.  No abdominal bruits. No femoral bruits. Abdomen: Bowel sounds are positive, abdomen soft and non-tender without masses or                  Hernia's noted. Msk:  Back normal, normal gait. Normal strength and tone for age. Extremities: No clubbing, cyanosis or edema.  DP +1 Neuro: Alert and oriented X 3. Psych:  Good affect, responds appropriately  EKG:SR with 1 st degree AV block. Nonspecific T-wave abnormality  ASSESSMENT AND PLAN 

## 2012-12-24 NOTE — Assessment & Plan Note (Signed)
He has had no further episodes of syncope. He played golf yesterday and had no dizziness lightheadedness and or shortness of breath. Uncertain if syncope was related to ventricular tachycardia as he had this predominantly during his stress test was frequent salvos.

## 2012-12-24 NOTE — Assessment & Plan Note (Signed)
Blood pressure is moderately controlled, we will continue him on metoprolol, and valsartan. Medication changes may be necessary post cardiac catheterization.

## 2012-12-24 NOTE — Assessment & Plan Note (Signed)
He has prominent systolic murmur, best heard at the right sternal border. Valve gradients will be evaluated during cardiac catheterization.

## 2012-12-25 ENCOUNTER — Encounter (HOSPITAL_COMMUNITY): Payer: Self-pay | Admitting: Pharmacy Technician

## 2012-12-26 ENCOUNTER — Encounter: Payer: Medicare Other | Admitting: Adult Health

## 2012-12-27 ENCOUNTER — Ambulatory Visit (HOSPITAL_COMMUNITY)
Admission: RE | Admit: 2012-12-27 | Discharge: 2012-12-27 | Disposition: A | Discharge status: Home / Self Care | Payer: Medicare Other | Source: Ambulatory Visit | Attending: Cardiovascular Disease | Admitting: Cardiovascular Disease

## 2012-12-27 ENCOUNTER — Encounter (HOSPITAL_COMMUNITY)
Admission: RE | Disposition: A | Discharge status: Home / Self Care | Payer: Self-pay | Source: Ambulatory Visit | Attending: Cardiovascular Disease

## 2012-12-27 DIAGNOSIS — I2510 Atherosclerotic heart disease of native coronary artery without angina pectoris: Secondary | ICD-10-CM

## 2012-12-27 DIAGNOSIS — I359 Nonrheumatic aortic valve disorder, unspecified: Secondary | ICD-10-CM | POA: Insufficient documentation

## 2012-12-27 DIAGNOSIS — I2582 Chronic total occlusion of coronary artery: Secondary | ICD-10-CM | POA: Insufficient documentation

## 2012-12-27 DIAGNOSIS — R55 Syncope and collapse: Secondary | ICD-10-CM | POA: Insufficient documentation

## 2012-12-27 DIAGNOSIS — R9439 Abnormal result of other cardiovascular function study: Secondary | ICD-10-CM | POA: Insufficient documentation

## 2012-12-27 DIAGNOSIS — I10 Essential (primary) hypertension: Secondary | ICD-10-CM

## 2012-12-27 DIAGNOSIS — E785 Hyperlipidemia, unspecified: Secondary | ICD-10-CM | POA: Insufficient documentation

## 2012-12-27 DIAGNOSIS — I060 Rheumatic aortic stenosis: Secondary | ICD-10-CM

## 2012-12-27 HISTORY — PX: LEFT HEART CATHETERIZATION WITH CORONARY ANGIOGRAM: SHX5451

## 2012-12-27 SURGERY — LEFT HEART CATHETERIZATION WITH CORONARY ANGIOGRAM
Anesthesia: LOCAL

## 2012-12-27 MED ORDER — LIDOCAINE HCL (PF) 1 % IJ SOLN
INTRAMUSCULAR | Status: AC
  Filled 2012-12-27: qty 30

## 2012-12-27 MED ORDER — ONDANSETRON HCL 4 MG/2ML IJ SOLN: 4.0000 mg | Freq: Four times a day (QID) | INTRAMUSCULAR | Status: DC | PRN

## 2012-12-27 MED ORDER — ASPIRIN 81 MG PO CHEW
324.0000 mg | CHEWABLE_TABLET | ORAL | Status: AC
  Administered 2012-12-27: 324 mg via ORAL
  Filled 2012-12-27: qty 4

## 2012-12-27 MED ORDER — SODIUM CHLORIDE 0.9 % IV SOLN
INTRAVENOUS | Status: DC
  Administered 2012-12-27: 12:00:00 via INTRAVENOUS

## 2012-12-27 MED ORDER — SODIUM CHLORIDE 0.9 % IJ SOLN: 3.0000 mL | Freq: Two times a day (BID) | INTRAMUSCULAR | Status: DC

## 2012-12-27 MED ORDER — SODIUM CHLORIDE 0.9 % IJ SOLN: 3.0000 mL | INTRAMUSCULAR | Status: DC | PRN

## 2012-12-27 MED ORDER — ACETAMINOPHEN 325 MG PO TABS: 650.0000 mg | ORAL_TABLET | ORAL | Status: DC | PRN

## 2012-12-27 MED ORDER — SODIUM CHLORIDE 0.9 % IV SOLN
INTRAVENOUS | Status: DC
  Administered 2012-12-27: 09:00:00 via INTRAVENOUS

## 2012-12-27 MED ORDER — FENTANYL CITRATE 0.05 MG/ML IJ SOLN
INTRAMUSCULAR | Status: AC
  Filled 2012-12-27: qty 2

## 2012-12-27 MED ORDER — HEPARIN (PORCINE) IN NACL 2-0.9 UNIT/ML-% IJ SOLN
INTRAMUSCULAR | Status: AC
  Filled 2012-12-27: qty 1000

## 2012-12-27 MED ORDER — MIDAZOLAM HCL 2 MG/2ML IJ SOLN
INTRAMUSCULAR | Status: AC
  Filled 2012-12-27: qty 2

## 2012-12-27 MED ORDER — SODIUM CHLORIDE 0.9 % IV SOLN: 250.0000 mL | INTRAVENOUS | Status: DC | PRN

## 2012-12-27 NOTE — CV Procedure (Signed)
   Cardiac Catheterization Operative Report  [name] [id number] 5/9/201411:16 AM Carylon Perches, MD  Procedure Performed:  1. Left Heart Catheterization 2. Selective Coronary Angiography 3. SVG angiography 4. LIMA graft angiography  Operator: Verne Carrow, MD  Indication:  79 yo male with history of CAD s/p CABG, HTN, HLD, mild aortic valve stenosis with recent syncope, abnormal stress test, VT during stress test. Cath to exclude progression of CAD.                                     Procedure Details:  The risks, benefits, complications, treatment options, and expected outcomes were discussed with the patient. The patient and/or family concurred with the proposed plan, giving informed consent. The patient was brought to the cath lab after IV hydration was begun and oral premedication was given. The patient was further sedated with Versed and Fentanyl. The right groin was prepped and draped in the usual manner. Using the modified Seldinger access technique, a 5 French sheath was placed in the right femoral artery. Standard diagnostic catheters were used to perform selective coronary angiography. The JR4 catheter was used to engage the native RCA, both vein grafts, the IMA graft. The JR4 catheter was also used to cross the AV. Pressures were measured. No LV gram was performed. A Mynx was placed in the right femoral artery.    There were no immediate complications. The patient was taken to the recovery area in stable condition.   Hemodynamic Findings: Central aortic pressure: 155/72 Left ventricular pressure: 161/10/22  Angiographic Findings:  Left main:  100% ostial occlusion  Left Anterior Descending Artery: Occlusion in the left main. LAD fills from the patent IMA graft.   Circumflex Artery: Occlusion in the left main. The obtuse marginal fills from the patent vein graft.   Right Coronary Artery: 100% proximal occlusion. The distal vessel, PDA and PLA fills from the  patent vein graft.   Graft Anatomy:  SVG sequential to the PDA and posterolateral branch is patent. The proximal body of the vein graft has 20% stenosis.  SVG to the obtuse marginal branch is patent. There is a patent stent in the proximal body of the vein graft with minimal restenosis (10-20%).  LIMA graft to the mid LAD is patent  Left Ventricular Angiogram: Deferred.   Aortic valve: Easily crossed. 6-10 mm peak to peak gradient across AV c/w mild aortic valve stenosis.   Impression: 1. Severe triple vessel CAD s/p 4V CABG with 4/4 patent bypass grafts.  2. Mild aortic stenosis 3. No focal stenosis noted to explain possible ischemia/VT.   Recommendations: Continue medical management.        Complications:  None. The patient tolerated the procedure well.

## 2012-12-27 NOTE — H&P (View-Only) (Signed)
HPI Mr. are low is a 79 year old of Dr. Dietrich Pates we are following for ongoing assessment and management of CAD, with history of syncope and aortic stenosis. He was recently admitted after passing out while playing golf [place]. He had followup stress test completed post hospitalization demonstrating abnormal exercise Myoview overall low to intermediate risk, abnormal ST segment changes noted during exercise with PVCs couplets and brief burst of NSVT. Perfusion imaging demonstrated apical inferior lateral ischemia and  component of scar. LVEF is 45% with global hypokinesis.  A cardiac monitor was placed on the patient but he was unable to wear it consistently as he could not remember how to take it on an off correctly. Baseline tracings for 24 hours revealed normal sinus rhythm without frequent arrhythmias.  Allergies  Allergen Reactions  . Propylene Glycol Rash    Current Outpatient Prescriptions  Medication Sig Dispense Refill  . acetaminophen (TYLENOL) 500 MG tablet Take 1,000 mg by mouth every 6 (six) hours as needed for pain.      Marland Kitchen allopurinol (ZYLOPRIM) 300 MG tablet Take 600 mg by mouth daily.       Marland Kitchen aspirin EC 81 MG tablet Take 81 mg by mouth daily.      Marland Kitchen atorvastatin (LIPITOR) 20 MG tablet Take 20 mg by mouth every evening.       . clopidogrel (PLAVIX) 75 MG tablet Take 75 mg by mouth daily.      Marland Kitchen levothyroxine (SYNTHROID, LEVOTHROID) 125 MCG tablet Take 125 mcg by mouth daily.       . metoprolol (TOPROL-XL) 100 MG 24 hr tablet Take 150 mg by mouth daily.       . multivitamin (THERAGRAN) per tablet Take 1 tablet by mouth daily.       . nitroGLYCERIN (NITROSTAT) 0.4 MG SL tablet Place 0.4 mg under the tongue every 5 (five) minutes x 3 doses as needed. For chest pain      . omeprazole (PRILOSEC) 20 MG capsule Take 20 mg by mouth daily.       Marland Kitchen pyridOXINE (VITAMIN B-6) 100 MG tablet Take 100 mg by mouth daily.       . Tamsulosin HCl (FLOMAX) 0.4 MG CAPS Take 0.4 mg by  mouth daily.       Marland Kitchen testosterone cypionate (DEPOTESTOTERONE CYPIONATE) 200 MG/ML injection Inject 70 mg into the muscle every 14 (fourteen) days. Patient is only  Taking 70 mg      . valsartan (DIOVAN) 320 MG tablet Take 320 mg by mouth daily.        No current facility-administered medications for this visit.    Past Medical History  Diagnosis Date  . Hyperlipidemia   . Benign prostatic hypertrophy   . Carotid artery disease     DES, SVG to circumflex marginal, October, 2010 ( SVG to PDA and PLA patent , LIMA to LAD patent, EF 60%, mild inferior hypokinesis; H/o mild CHF, CABG  . CAD (coronary artery disease)     DES, SVG to circumflex marginal, October, 2010 ( SVG to PDA and PLA patent , LIMA to LAD patent, EF 60%, mild inferior hypokinesis  . Syncope     Remote, etiology unknown  . Psoriasis     severe; with total skin exfoliation in the past resolved  . Hx of decompressive lumbar laminectomy     Dr.Botero December, 2011  . Aortic stenosis     Mild, echo, August, 2012  . HTN (hypertension)  takes meds daily  . Hypothyroidism   . Nephrolithiasis   . GERD (gastroesophageal reflux disease)     takes daily  . Arthritis   . Neuromuscular disorder     Past Surgical History  Procedure Laterality Date  . Coronary artery bypass graft  1995  . Decompression of left median nerve    . Left carotid endarectomy and dacron patch angioplasty]    . Bilateral l3-4 laminectomies    . Removal of synovial cyst    . Posterior arthrodesis with autograft and allograft    . Colonoscopy  11/23/2011    Procedure: COLONOSCOPY;  Surgeon: Malissa Hippo, MD;  Location: [facility];  Service: Endoscopy;  Laterality: N/A;  730  . Carotid endarterectomy  2005    left  . Back surgery      x3  . Tonsillectomy    . Appendectomy    . Knee arthroplasty  04/16/2012    left total  . Total knee arthroplasty  04/16/2012    Procedure: TOTAL KNEE ARTHROPLASTY;  Surgeon: Valeria Batman, MD;   Location: [facility] OR;  Service: Orthopedics;  Laterality: Left;  Marland Kitchen Eye surgery      cataracts; bilateral  . Joint replacement  Sept. 27, 2013    Left Knee    OZH:YQMVHQ of systems complete and found to be negative unless listed above PHYSICAL EXAM BP 156/86  Pulse 62  Ht 5\' 10"  (1.778 m)  Wt 192 lb (87.091 kg)  BMI 27.55 kg/m2  General: Well developed, well nourished, in no acute distress Head: Eyes PERRLA, No xanthomas.   Normal cephalic and atramatic  Lungs: Clear bilaterally to auscultation and percussion. Heart: HRRR S1 S2, with 2/6 systolic murmur,  Pulses are 2+ & equal.            No carotid bruit. No JVD.  No abdominal bruits. No femoral bruits. Abdomen: Bowel sounds are positive, abdomen soft and non-tender without masses or                  Hernia's noted. Msk:  Back normal, normal gait. Normal strength and tone for age. Extremities: No clubbing, cyanosis or edema.  DP +1 Neuro: Alert and oriented X 3. Psych:  Good affect, responds appropriately  EKG:SR with 1 st degree AV block. Nonspecific T-wave abnormality  ASSESSMENT AND PLAN

## 2012-12-27 NOTE — Interval H&P Note (Signed)
History and Physical Interval Note:  12/27/2012 10:10 AM  [name]  has presented today for cardiac cath with the diagnosis of cad/abnormal stress test.  The various methods of treatment have been discussed with the patient and family. After consideration of risks, benefits and other options for treatment, the patient has consented to  Procedure(s): LEFT HEART CATHETERIZATION WITH CORONARY ANGIOGRAM (N/A) as a surgical intervention .  The patient's history has been reviewed, patient examined, no change in status, stable for surgery.  I have reviewed the patient's chart and labs.  Questions were answered to the patient's satisfaction.     MCALHANY,CHRISTOPHER

## 2012-12-30 ENCOUNTER — Other Ambulatory Visit: Payer: Self-pay | Admitting: *Deleted

## 2012-12-30 ENCOUNTER — Encounter: Payer: Self-pay | Admitting: *Deleted

## 2012-12-30 DIAGNOSIS — I493 Ventricular premature depolarization: Secondary | ICD-10-CM

## 2013-01-03 ENCOUNTER — Telehealth: Payer: Self-pay | Admitting: Cardiology

## 2013-01-03 NOTE — Telephone Encounter (Signed)
New Problem:    Patient called in wanting to see Dr. Myrtis Ser soon after being discharged form [facility].   Please call back.

## 2013-01-03 NOTE — Telephone Encounter (Signed)
Per Dr Myrtis Ser pt is advised that Dr Myrtis Ser will call him concerning his f/u, pt verbalized understanding.

## 2013-03-17 ENCOUNTER — Encounter: Payer: Self-pay | Admitting: Cardiology

## 2013-03-17 DIAGNOSIS — E039 Hypothyroidism, unspecified: Secondary | ICD-10-CM | POA: Insufficient documentation

## 2013-03-17 DIAGNOSIS — G709 Myoneural disorder, unspecified: Secondary | ICD-10-CM | POA: Insufficient documentation

## 2013-03-17 DIAGNOSIS — I472 Ventricular tachycardia: Secondary | ICD-10-CM | POA: Insufficient documentation

## 2013-03-17 DIAGNOSIS — I493 Ventricular premature depolarization: Secondary | ICD-10-CM | POA: Insufficient documentation

## 2013-03-17 DIAGNOSIS — N200 Calculus of kidney: Secondary | ICD-10-CM | POA: Insufficient documentation

## 2013-03-18 ENCOUNTER — Encounter: Payer: Self-pay | Admitting: Cardiology

## 2013-03-18 ENCOUNTER — Ambulatory Visit (INDEPENDENT_AMBULATORY_CARE_PROVIDER_SITE_OTHER): Payer: Self-pay | Admitting: Cardiology

## 2013-03-18 VITALS — BP 138/72 | HR 58 | Ht 70.0 in | Wt 186.0 lb

## 2013-03-18 DIAGNOSIS — R9430 Abnormal result of cardiovascular function study, unspecified: Secondary | ICD-10-CM

## 2013-03-18 DIAGNOSIS — R55 Syncope and collapse: Secondary | ICD-10-CM

## 2013-03-18 DIAGNOSIS — I350 Nonrheumatic aortic (valve) stenosis: Secondary | ICD-10-CM

## 2013-03-18 DIAGNOSIS — I472 Ventricular tachycardia: Secondary | ICD-10-CM

## 2013-03-18 DIAGNOSIS — E8770 Fluid overload, unspecified: Secondary | ICD-10-CM

## 2013-03-18 DIAGNOSIS — E8779 Other fluid overload: Secondary | ICD-10-CM

## 2013-03-18 DIAGNOSIS — I359 Nonrheumatic aortic valve disorder, unspecified: Secondary | ICD-10-CM

## 2013-03-18 DIAGNOSIS — I2510 Atherosclerotic heart disease of native coronary artery without angina pectoris: Secondary | ICD-10-CM

## 2013-03-18 DIAGNOSIS — R0989 Other specified symptoms and signs involving the circulatory and respiratory systems: Secondary | ICD-10-CM

## 2013-03-18 NOTE — Assessment & Plan Note (Signed)
The patient notes that over time he said some mild edema. His testosterone was stopped by his primary physician. The patient feels that his edema completely disappeared after this and it was temporally related.

## 2013-03-18 NOTE — Assessment & Plan Note (Signed)
The patient had what was called a brief burst of ventricular tachycardia at the time of his stress study in May, 2014. This may have been only 3 beats. No definite abnormalities have been found. There was no ischemia cath. The attempt to wear the event recorder was limited. I've chosen not to repeat it at this time.

## 2013-03-18 NOTE — Patient Instructions (Addendum)
Your physician has recommended you make the following change in your medication: stop taking Plavix  Your physician recommends that you schedule a follow-up appointment in: 3 months (Please call office at [phone] one day next week to schedule this appointment).

## 2013-03-18 NOTE — Assessment & Plan Note (Addendum)
Coronary disease is stable. Cath in May, 2014 revealed patent grafts. No obvious cause for any ventricular tachycardia. The patient has been on Plavix with his aspirin for greater than 2 years since his drug-eluting stent. It is now appropriate to stop his Plavix. He was instructed to do so.

## 2013-03-18 NOTE — Assessment & Plan Note (Addendum)
The patient had a syncopal episode in May, 2014. Most likely etiology was volume depletion from playing golf in the heat without fluid replenishment. I have carefully considered all of the issues. He did not have obvious obstructive disease on his cath that would warrant an intervention. We only have a brief amount of monitoring time but I've chosen not to repeat this. His LV function is good. The plan will be to go back to usual activities. I'll see him back for earlier followup than usual. He knows to contact me if he has any significant problems.  As part of today's evaluation I spent greater than 25 minutes with is total care. Multiple records were reviewed. In addition more than half of this time was spent discussing the issues of his syncopal episode and his arrhythmia with him.

## 2013-03-18 NOTE — Assessment & Plan Note (Signed)
EF was 55% by echo in April, 2014.

## 2013-03-18 NOTE — Progress Notes (Signed)
HPI  Patient is seen today to followup coronary artery disease and a history of syncope. The patient had a syncopal episode in May, 2014. He tells me today that he had played golf on a hot day. He then went home and realizes he left his cell phone and return to the golf course. While walking towards the clubhouse he began to feel weak and then had syncope. There was a brief prodrome. After this time he had a nuclear scan to rule out the possibility of ischemia. On the scan he had some PVCs and a very short bursts of ventricular tachycardia. I do not know how many beats there were. It was asymptomatic. There was question of some ischemia and therefore decision was made to proceed with cardiac catheterization. This study showed that is coronary status was stable. There was no obvious source of ischemia that could have caused a significant arrhythmia. He again had mild aortic stenosis. In addition attempt was made for him to wear an event recorder. He actually only had it on for a day or 2 and there were difficulties with it. Decision was made for him to not wear it any longer.  Since that time he has felt well. He has not felt any palpitations. He has been more careful with his hydration. He's not had any dizzy spells or syncopal spells.  Allergies  Allergen Reactions  . Propylene Glycol Rash    Current Outpatient Prescriptions  Medication Sig Dispense Refill  . allopurinol (ZYLOPRIM) 300 MG tablet Take 600 mg by mouth daily.       Marland Kitchen aspirin EC 81 MG tablet Take 81 mg by mouth daily.      Marland Kitchen atorvastatin (LIPITOR) 20 MG tablet Take 20 mg by mouth every evening.       Marland Kitchen levothyroxine (SYNTHROID, LEVOTHROID) 125 MCG tablet Take 125 mcg by mouth daily.       . metoprolol (TOPROL-XL) 100 MG 24 hr tablet Take 150 mg by mouth daily.       . multivitamin (THERAGRAN) per tablet Take 1 tablet by mouth daily.       . nitroGLYCERIN (NITROSTAT) 0.4 MG SL tablet Place 0.4 mg under the tongue every 5 (five)  minutes x 3 doses as needed. For chest pain      . omeprazole (PRILOSEC) 20 MG capsule Take 20 mg by mouth daily.       Marland Kitchen pyridOXINE (VITAMIN B-6) 100 MG tablet Take 100 mg by mouth daily.       . Tamsulosin HCl (FLOMAX) 0.4 MG CAPS Take 0.4 mg by mouth daily.       . valsartan (DIOVAN) 320 MG tablet Take 320 mg by mouth daily.        No current facility-administered medications for this visit.    History   Social History  . Marital Status: Married    Spouse Name: N/A    Number of Children: N/A  . Years of Education: N/A   Occupational History  . Not on file.   Social History Main Topics  . Smoking status: Never Smoker   . Smokeless tobacco: Never Used  . Alcohol Use: Yes     Comment: Occasional  . Drug Use: No  . Sexually Active: Not Currently   Other Topics Concern  . Not on file   Social History Narrative  . No narrative on file    Family History  Problem Relation Age of Onset  . Colon cancer Neg Hx   .  Heart attack Mother   . Heart disease Father   . Heart attack Son     Past Medical History  Diagnosis Date  . Hyperlipidemia   . Benign prostatic hypertrophy   . Carotid artery disease     DES, SVG to circumflex marginal, October, 2010 ( SVG to PDA and PLA patent , LIMA to LAD patent, EF 60%, mild inferior hypokinesis; H/o mild CHF, CABG  . CAD (coronary artery disease)     DES, SVG to circumflex marginal, October, 2010 ( SVG to PDA and PLA patent , LIMA to LAD patent, EF 60%, mild inferior hypokinesis  . Syncope     Remote, etiology unknown  . Psoriasis     severe; with total skin exfoliation in the past resolved  . Hx of decompressive lumbar laminectomy     Dr.Botero December, 2011  . Aortic stenosis     Mild, echo, August, 2012  . HTN (hypertension)     takes meds daily  . Hypothyroidism   . Nephrolithiasis   . GERD (gastroesophageal reflux disease)     takes daily  . Arthritis   . Neuromuscular disorder   . PVC's (premature ventricular  contractions)     May, 2014  . Ventricular tachycardia, non-sustained     Past Surgical History  Procedure Laterality Date  . Coronary artery bypass graft  1995  . Decompression of left median nerve    . Left carotid endarectomy and dacron patch angioplasty]    . Bilateral l3-4 laminectomies    . Removal of synovial cyst    . Posterior arthrodesis with autograft and allograft    . Colonoscopy  11/23/2011    Procedure: COLONOSCOPY;  Surgeon: Malissa Hippo, MD;  Location: [facility];  Service: Endoscopy;  Laterality: N/A;  730  . Carotid endarterectomy  2005    left  . Back surgery      x3  . Tonsillectomy    . Appendectomy    . Knee arthroplasty  04/16/2012    left total  . Total knee arthroplasty  04/16/2012    Procedure: TOTAL KNEE ARTHROPLASTY;  Surgeon: Valeria Batman, MD;  Location: [facility] OR;  Service: Orthopedics;  Laterality: Left;  Marland Kitchen Eye surgery      cataracts; bilateral  . Joint replacement  Sept. 27, 2013    Left Knee    Patient Active Problem List   Diagnosis Date Noted  . PVC's (premature ventricular contractions)   . Neuromuscular disorder   . Nephrolithiasis   . Hypothyroidism   . Ventricular tachycardia, non-sustained   . Osteoarthritis of knee 04/19/2012  . Hyponatremia 04/19/2012  . Hypokalemia 04/19/2012  . Postoperative anemia due to acute blood loss 04/19/2012  . Aortic stenosis   . Occlusion and stenosis of carotid artery without mention of cerebral infarction 02/13/2012  . Carotid artery disease   . Hyperlipidemia   . HTN (hypertension)   . CAD (coronary artery disease)   . Syncope   . Psoriasis   . Ejection fraction   . Fluid overload   . Carpal tunnel syndrome   . Hx of CABG   . BENIGN PROSTATIC HYPERTROPHY, HX OF 06/11/2009    ROS   Patient denies fever, chills, headache, sweats, rash, change in vision, change in hearing, chest pain, cough, nausea or vomiting, urinary symptoms. All other systems are reviewed and are  negative.  PHYSICAL EXAM  Patient is oriented to person time and place. Affect is normal. There is no jugular  venous distention. Lungs are clear. Respiratory effort is nonlabored. Cardiac exam reveals S1 and S2. There no clicks. There is a crescendo decrescendo systolic murmur consistent with his mild aortic stenosis.. The abdomen is soft. There is no peripheral edema. There no musculoskeletal deformities. There are no skin rashes.  Filed Vitals:   03/18/13 1109  BP: 138/72  Pulse: 58  Height: 5\' 10"  (1.778 m)  Weight: 186 lb (84.369 kg)  SpO2: 97%    ASSESSMENT & PLAN

## 2013-03-18 NOTE — Assessment & Plan Note (Signed)
He has mild aortic stenosis from his recent catheterization. No further workup is indicated.

## 2013-03-31 ENCOUNTER — Encounter: Payer: Self-pay | Admitting: Cardiology

## 2013-06-02 ENCOUNTER — Other Ambulatory Visit: Payer: Medicare Other

## 2013-06-02 ENCOUNTER — Ambulatory Visit: Payer: Medicare Other | Admitting: Neurosurgery

## 2013-06-20 ENCOUNTER — Encounter: Payer: Self-pay | Admitting: Cardiology

## 2013-06-20 ENCOUNTER — Ambulatory Visit (INDEPENDENT_AMBULATORY_CARE_PROVIDER_SITE_OTHER): Payer: Medicare Other | Admitting: Cardiology

## 2013-06-20 VITALS — BP 130/76 | HR 86 | Ht 70.0 in | Wt 195.0 lb

## 2013-06-20 DIAGNOSIS — I350 Nonrheumatic aortic (valve) stenosis: Secondary | ICD-10-CM

## 2013-06-20 DIAGNOSIS — I359 Nonrheumatic aortic valve disorder, unspecified: Secondary | ICD-10-CM

## 2013-06-20 DIAGNOSIS — I779 Disorder of arteries and arterioles, unspecified: Secondary | ICD-10-CM

## 2013-06-20 DIAGNOSIS — I2510 Atherosclerotic heart disease of native coronary artery without angina pectoris: Secondary | ICD-10-CM

## 2013-06-20 DIAGNOSIS — R55 Syncope and collapse: Secondary | ICD-10-CM

## 2013-06-20 DIAGNOSIS — I472 Ventricular tachycardia: Secondary | ICD-10-CM

## 2013-06-20 NOTE — Assessment & Plan Note (Signed)
Coronary disease is stable. No change in therapy. 

## 2013-06-20 NOTE — Progress Notes (Signed)
HPI  Patient is seen today to followup history of coronary disease and one episode of syncope. I saw him last March 18, 2013. He's doing well. He has not had any return of his syncope or presyncope. We think it is most likely that this was related to some dehydration when playing golf. He is fully active.  Allergies  Allergen Reactions  . Propylene Glycol Rash    Current Outpatient Prescriptions  Medication Sig Dispense Refill  . allopurinol (ZYLOPRIM) 300 MG tablet Take 600 mg by mouth daily.       Marland Kitchen aspirin EC 81 MG tablet Take 81 mg by mouth daily.      Marland Kitchen atorvastatin (LIPITOR) 20 MG tablet Take 20 mg by mouth every evening.       . latanoprost (XALATAN) 0.005 % ophthalmic solution Place 1 drop into both eyes at bedtime.      Marland Kitchen levothyroxine (SYNTHROID, LEVOTHROID) 125 MCG tablet Take 125 mcg by mouth daily.       . metoprolol (TOPROL-XL) 100 MG 24 hr tablet Take 150 mg by mouth daily.       . multivitamin (THERAGRAN) per tablet Take 1 tablet by mouth daily.       . nitroGLYCERIN (NITROSTAT) 0.4 MG SL tablet Place 0.4 mg under the tongue every 5 (five) minutes x 3 doses as needed. For chest pain      . omeprazole (PRILOSEC) 20 MG capsule Take 20 mg by mouth daily.       Marland Kitchen pyridOXINE (VITAMIN B-6) 100 MG tablet Take 100 mg by mouth daily.       . Tamsulosin HCl (FLOMAX) 0.4 MG CAPS Take 0.4 mg by mouth daily.       . valsartan (DIOVAN) 320 MG tablet Take 320 mg by mouth daily.        No current facility-administered medications for this visit.    History   Social History  . Marital Status: Married    Spouse Name: N/A    Number of Children: N/A  . Years of Education: N/A   Occupational History  . Not on file.   Social History Main Topics  . Smoking status: Never Smoker   . Smokeless tobacco: Never Used  . Alcohol Use: Yes     Comment: Occasional  . Drug Use: No  . Sexual Activity: Not Currently   Other Topics Concern  . Not on file   Social History Narrative  .  No narrative on file    Family History  Problem Relation Age of Onset  . Colon cancer Neg Hx   . Heart attack Mother   . Heart disease Father   . Heart attack Son     Past Medical History  Diagnosis Date  . Hyperlipidemia   . Benign prostatic hypertrophy   . Carotid artery disease     DES, SVG to circumflex marginal, October, 2010 ( SVG to PDA and PLA patent , LIMA to LAD patent, EF 60%, mild inferior hypokinesis; H/o mild CHF, CABG  . CAD (coronary artery disease)     DES, SVG to circumflex marginal, October, 2010 ( SVG to PDA and PLA patent , LIMA to LAD patent, EF 60%, mild inferior hypokinesis  . Syncope     Remote, etiology unknown  . Psoriasis     severe; with total skin exfoliation in the past resolved  . Hx of decompressive lumbar laminectomy     Dr.Botero December, 2011  . Aortic stenosis  Mild, echo, August, 2012  . HTN (hypertension)     takes meds daily  . Hypothyroidism   . Nephrolithiasis   . GERD (gastroesophageal reflux disease)     takes daily  . Arthritis   . Neuromuscular disorder   . PVC's (premature ventricular contractions)     May, 2014  . Ventricular tachycardia, non-sustained     Past Surgical History  Procedure Laterality Date  . Coronary artery bypass graft  1995  . Decompression of left median nerve    . Left carotid endarectomy and dacron patch angioplasty]    . Bilateral l3-4 laminectomies    . Removal of synovial cyst    . Posterior arthrodesis with autograft and allograft    . Colonoscopy  11/23/2011    Procedure: COLONOSCOPY;  Surgeon: Malissa Hippo, MD;  Location: [facility];  Service: Endoscopy;  Laterality: N/A;  730  . Carotid endarterectomy  2005    left  . Back surgery      x3  . Tonsillectomy    . Appendectomy    . Knee arthroplasty  04/16/2012    left total  . Total knee arthroplasty  04/16/2012    Procedure: TOTAL KNEE ARTHROPLASTY;  Surgeon: Valeria Batman, MD;  Location: [facility] OR;  Service: Orthopedics;   Laterality: Left;  Marland Kitchen Eye surgery      cataracts; bilateral  . Joint replacement  Sept. 27, 2013    Left Knee    Patient Active Problem List   Diagnosis Date Noted  . PVC's (premature ventricular contractions)   . Neuromuscular disorder   . Nephrolithiasis   . Hypothyroidism   . Ventricular tachycardia, non-sustained   . Osteoarthritis of knee 04/19/2012  . Hyponatremia 04/19/2012  . Hypokalemia 04/19/2012  . Postoperative anemia due to acute blood loss 04/19/2012  . Aortic stenosis   . Occlusion and stenosis of carotid artery without mention of cerebral infarction 02/13/2012  . Carotid artery disease   . Hyperlipidemia   . HTN (hypertension)   . CAD (coronary artery disease)   . Syncope   . Psoriasis   . Ejection fraction   . Fluid overload   . Carpal tunnel syndrome   . Hx of CABG   . BENIGN PROSTATIC HYPERTROPHY, HX OF 06/11/2009    ROS   Patient denies fever, chills, headache, sweats, rash, change in vision, change in hearing, chest pain, cough, nausea vomiting, urinary symptoms. All other systems are reviewed and are negative.  PHYSICAL EXAM  Patient is oriented to person time and place. Affect is normal. He has mild kyphosis of the spine. There is no jugular venous distention. Aortic stenosis murmur radiates to his right neck. Lungs are clear. Respiratory effort is nonlabored. Cardiac exam reveals S1 and S2. There is a 3/6 crescendo decrescendo systolic murmur of aortic stenosis. The abdomen is soft. There is no peripheral edema.  Filed Vitals:   06/20/13 1030  BP: 130/76  Pulse: 86  Height: 5\' 10"  (1.778 m)  Weight: 195 lb (88.451 kg)  SpO2: 95%     ASSESSMENT & PLAN

## 2013-06-20 NOTE — Assessment & Plan Note (Signed)
Aortic stenosis remains mild by catheterization May, 2014. No further workup at this time.

## 2013-06-20 NOTE — Assessment & Plan Note (Signed)
No recurrent syncope 

## 2013-06-20 NOTE — Patient Instructions (Signed)
Your physician recommends that you continue on your current medications as directed. Please refer to the Current Medication list given to you today.  Your physician wants you to follow-up in: 6 months. You will receive a reminder letter in the mail two months in advance. If you don't receive a letter, please call our office to schedule the follow-up appointment.  

## 2013-06-20 NOTE — Assessment & Plan Note (Addendum)
He's had a carotid endarterectomy in the past. He has scheduled followup with the vascular surgeons.

## 2013-06-20 NOTE — Assessment & Plan Note (Signed)
Patient had only ventricular tachycardia at the time of 1 stress test. He's never had proven symptomatic episode. He has good LV function. No further workup.

## 2013-06-27 ENCOUNTER — Encounter: Payer: Self-pay | Admitting: Family

## 2013-06-30 ENCOUNTER — Ambulatory Visit (HOSPITAL_COMMUNITY)
Admission: RE | Admit: 2013-06-30 | Discharge: 2013-06-30 | Disposition: A | Discharge status: Home / Self Care | Payer: Medicare Other | Source: Ambulatory Visit | Attending: Family | Admitting: Family

## 2013-06-30 ENCOUNTER — Ambulatory Visit (INDEPENDENT_AMBULATORY_CARE_PROVIDER_SITE_OTHER): Payer: Medicare Other | Admitting: Family

## 2013-06-30 ENCOUNTER — Encounter: Payer: Self-pay | Admitting: Family

## 2013-06-30 DIAGNOSIS — Z48812 Encounter for surgical aftercare following surgery on the circulatory system: Secondary | ICD-10-CM | POA: Insufficient documentation

## 2013-06-30 DIAGNOSIS — I6529 Occlusion and stenosis of unspecified carotid artery: Secondary | ICD-10-CM | POA: Insufficient documentation

## 2013-06-30 NOTE — Patient Instructions (Signed)
Stroke Prevention Some medical conditions and behaviors are associated with an increased chance of having a stroke. You may prevent a stroke by making healthy choices and managing medical conditions. Reduce your risk of having a stroke by:  Staying physically active. Get at least 30 minutes of activity on most or all days.  Not smoking. It may also be helpful to avoid exposure to secondhand smoke.  Limiting alcohol use. Moderate alcohol use is considered to be:  No more than 2 drinks per day for men.  No more than 1 drink per day for nonpregnant women.  Eating healthy foods.  Include 5 or more servings of fruits and vegetables a day.  Certain diets may be prescribed to address high blood pressure, high cholesterol, diabetes, or obesity.  Managing your cholesterol levels.  A low-saturated fat, low-trans fat, low-cholesterol, and high-fiber diet may control cholesterol levels.  Take any prescribed medicines to control cholesterol as directed by your caregiver.  Managing your diabetes.  A controlled-carbohydrate, controlled-sugar diet is recommended to manage diabetes.  Take any prescribed medicines to control diabetes as directed by your caregiver.  Controlling your high blood pressure (hypertension).  A low-salt (sodium), low-saturated fat, low-trans fat, and low-cholesterol diet is recommended to manage high blood pressure.  Take any prescribed medicines to control hypertension as directed by your caregiver.  Maintaining a healthy weight.  A reduced-calorie, low-sodium, low-saturated fat, low-trans fat, low-cholesterol diet is recommended to manage weight.  Stopping drug abuse.  Avoiding birth control pills.  Talk to your caregiver about the risks of taking birth control pills if you are over 35 years old, smoke, get migraines, or have ever had a blood clot.  Getting evaluated for sleep disorders (sleep apnea).  Talk to your caregiver about getting a sleep evaluation  if you snore a lot or have excessive sleepiness.  Taking medicines as directed by your caregiver.  For some people, aspirin or blood thinners (anticoagulants) are helpful in reducing the risk of forming abnormal blood clots that can lead to stroke. If you have the irregular heart rhythm of atrial fibrillation, you should be on a blood thinner unless there is a good reason you cannot take them.  Understand all your medicine instructions. SEEK IMMEDIATE MEDICAL CARE IF:   You have sudden weakness or numbness of the face, arm, or leg, especially on one side of the body.  You have sudden confusion.  You have trouble speaking (aphasia) or understanding.  You have sudden trouble seeing in one or both eyes.  You have sudden trouble walking.  You have dizziness.  You have a loss of balance or coordination.  You have a sudden, severe headache with no known cause.  You have new chest pain or an irregular heartbeat. Any of these symptoms may represent a serious problem that is an emergency. Do not wait to see if the symptoms will go away. Get medical help right away. Call your local emergency services (911 in [place] Do not drive yourself to the hospital. Document Released: 09/14/2004 Document Revised: 10/30/2011 Document Reviewed: 02/07/2013 [employer] Patient Information 2014 [employer]  

## 2013-06-30 NOTE — Progress Notes (Signed)
Established Carotid Patient  History of Present Illness  [name] is a 79 y.o. male patient of Dr. Myra Gianotti status post left CEA in 2009.  He returns today for scheduled surveillance. States he exercises at least 3 days/week on the treadmill.  Patient has Negative history of TIA or stroke symptom.  The patient denies amaurosis fugax or monocular blindness.  The patient  denies facial drooping.  Pt. denies hemiplegia.  The patient denies receptive or expressive aphasia.  Pt. denies extremity weakness.   Patient report New Medical or Surgical History: left knee replacement in 2013.  Pt Diabetic: No Pt smoker: non-smoker  Pt meds include: Statin : Yes Betablocker: Yes ASA: Yes Other anticoagulants/antiplatelets: no   Past Medical History  Diagnosis Date  . Hyperlipidemia   . Benign prostatic hypertrophy   . Carotid artery disease     DES, SVG to circumflex marginal, October, 2010 ( SVG to PDA and PLA patent , LIMA to LAD patent, EF 60%, mild inferior hypokinesis; H/o mild CHF, CABG  . CAD (coronary artery disease)     DES, SVG to circumflex marginal, October, 2010 ( SVG to PDA and PLA patent , LIMA to LAD patent, EF 60%, mild inferior hypokinesis  . Syncope     Remote, etiology unknown  . Psoriasis     severe; with total skin exfoliation in the past resolved  . Hx of decompressive lumbar laminectomy     Dr.Botero December, 2011  . Aortic stenosis     Mild, echo, August, 2012  . HTN (hypertension)     takes meds daily  . Hypothyroidism   . Nephrolithiasis   . GERD (gastroesophageal reflux disease)     takes daily  . Arthritis   . Neuromuscular disorder   . PVC's (premature ventricular contractions)     May, 2014  . Ventricular tachycardia, non-sustained     Social History History  Substance Use Topics  . Smoking status: Never Smoker   . Smokeless tobacco: Never Used  . Alcohol Use: Yes     Comment: Occasional    Family History Family History  Problem  Relation Age of Onset  . Colon cancer Neg Hx   . Heart attack Mother   . Heart disease Father   . Heart attack Son     Surgical History Past Surgical History  Procedure Laterality Date  . Coronary artery bypass graft  1995  . Decompression of left median nerve    . Left carotid endarectomy and dacron patch angioplasty]    . Bilateral l3-4 laminectomies    . Removal of synovial cyst    . Posterior arthrodesis with autograft and allograft    . Colonoscopy  11/23/2011    Procedure: COLONOSCOPY;  Surgeon: Malissa Hippo, MD;  Location: [facility];  Service: Endoscopy;  Laterality: N/A;  730  . Carotid endarterectomy  2005    left  . Back surgery      x3  . Tonsillectomy    . Appendectomy    . Knee arthroplasty  04/16/2012    left total  . Total knee arthroplasty  04/16/2012    Procedure: TOTAL KNEE ARTHROPLASTY;  Surgeon: Valeria Batman, MD;  Location: [facility];  Service: Orthopedics;  Laterality: Left;  Marland Kitchen Eye surgery      cataracts; bilateral  . Joint replacement  Sept. 27, 2013    Left Knee    Allergies  Allergen Reactions  . Propylene Glycol Rash    Current Outpatient  Prescriptions  Medication Sig Dispense Refill  . allopurinol (ZYLOPRIM) 300 MG tablet Take 600 mg by mouth daily.       Marland Kitchen aspirin EC 81 MG tablet Take 81 mg by mouth daily.      Marland Kitchen atorvastatin (LIPITOR) 20 MG tablet Take 20 mg by mouth every evening.       . latanoprost (XALATAN) 0.005 % ophthalmic solution Place 1 drop into both eyes at bedtime.      Marland Kitchen levothyroxine (SYNTHROID, LEVOTHROID) 125 MCG tablet Take 125 mcg by mouth daily.       . metoprolol (TOPROL-XL) 100 MG 24 hr tablet Take 150 mg by mouth daily.       . multivitamin (THERAGRAN) per tablet Take 1 tablet by mouth daily.       . nitroGLYCERIN (NITROSTAT) 0.4 MG SL tablet Place 0.4 mg under the tongue every 5 (five) minutes x 3 doses as needed. For chest pain      . omeprazole (PRILOSEC) 20 MG capsule Take 20 mg by mouth daily.       Marland Kitchen  pyridOXINE (VITAMIN B-6) 100 MG tablet Take 100 mg by mouth daily.       . Tamsulosin HCl (FLOMAX) 0.4 MG CAPS Take 0.4 mg by mouth daily.       . valsartan (DIOVAN) 320 MG tablet Take 320 mg by mouth daily.        No current facility-administered medications for this visit.    Review of Systems : [x]  Positive   [ ]  Denies  General:[ ]  Weight loss,  [ ]  Weight gain, [ ]  Loss of appetite, [ ]  Fever, [ ]  chills  Neurologic: [ ]  Dizziness, [ ]  Blackouts, [ ]  Headaches, [ ]  Seizure [ ]  Stroke, [ ]  "Mini stroke", [ ]  Slurred speech, [ ]  Temporary blindness;  [ ] weakness,  Ear/Nose/Throat: [ ]  Change in hearing, [ ]  Nose bleeds, [ ]  Hoarseness  Vascular:[ ]  Pain in legs with walking, [ ]  Pain in feet while lying flat , [ ]   Non-healing ulcer, [ ]  Blood clot in vein,    Pulmonary: [ ]  Home oxygen, [ ]   Productive cough, [ ]  Bronchitis, [ ]  Coughing up blood,  [ ]  Asthma, [ ]  Wheezing  Musculoskeletal:  [ ]  Arthritis, [ ]  Joint pain, [ ]  low back pain  Cardiac: [ ]  Chest pain, [ ]  Shortness of breath when lying flat, [ ]  Shortness of breath with exertion, [ ]  Palpitations, [ ]  Heart murmur, [ ]   Atrial fibrillation  Hematologic:[ ]  Easy Bruising, [ ]  Anemia; [ ]  Hepatitis  Psychiatric: [ ]   Depression, [ ]  Anxiety   Gastrointestinal: [ ]  Black stool, [ ]  Blood in stool, [ ]  Peptic ulcer disease,  [ ]  Gastroesophageal Reflux, [ ]  Trouble swallowing, [ ]  Diarrhea, [ ]  Constipation  Urinary: [ ]  chronic Kidney disease, [ ]  on HD, [ ]  Burning with urination, [ ]  Frequent urination, [ ]  Difficulty urinating;   Skin: [ ]  Rashes, [ ]  Wounds    Physical Examination  Filed Vitals:   06/30/13 1038  BP: 155/72  Pulse: 50  Resp:    Filed Weights   06/30/13 1033  Weight: 197 lb (89.359 kg)   Body mass index is 28.27 kg/(m^2).  General: WDWN male in NAD GAIT: normal Eyes: PERRLA Pulmonary:  CTAB, Negative  Rales, Negative rhonchi, & Negative wheezing.  Cardiac: regular Rhythm ,   Positive Murmurs.  VASCULAR EXAM Carotid Bruits Left Right  Transmitted cardiac murmur Transmitted cardiac murmur    Aorta is not palpable. Radial pulses are 2+ palpable and equal.                                                                                                                            LE Pulses LEFT RIGHT       POPLITEAL  not palpable   not palpable       POSTERIOR TIBIAL  not palpable   not palpable        DORSALIS PEDIS      ANTERIOR TIBIAL palpable   palpable     Gastrointestinal: soft, nontender, BS WNL, no r/g,  negative masses.  Musculoskeletal: Negative muscle atrophy/wasting. M/S 5/5 un UE, 4/5 in LE, Extremities without ischemic changes.  Neurologic: A&O X 3; Appropriate Affect ; SENSATION ;normal;  Speech is normal CN 2-12 intact, Pain and light touch intact in extremities, Motor exam as listed above.   Non-Invasive Vascular Imaging CAROTID DUPLEX 06/30/2013   Right ICA: <40% stenosis. Left ICA (CEA site): <40% stenosis.  These findings are Unchanged from previous exam.  Assessment: [name] is a 79 y.o. male who presents with asymptomatic minimal bilateral ICA  Stenosis. The  ICA stenosis is  Unchanged from previous exam.  Plan: Follow-up in 1 year with Carotid Duplex scan.   I discussed in depth with the patient the nature of atherosclerosis, and emphasized the importance of maximal medical management including strict control of blood pressure, blood glucose, and lipid levels, obtaining regular exercise, and continued cessation of smoking.  The patient is aware that without maximal medical management the underlying atherosclerotic disease process will progress, limiting the benefit of any interventions. The patient was given information about stroke prevention and what symptoms should prompt the patient to seek immediate medical care. Thank you for allowing [place] to participate in this patient's care.  Charisse March, RN, MSN,  FNP-C [facility] Office: [phone]  Clinic Physician: Myra Gianotti  06/30/2013 9:34 AM

## 2013-12-18 ENCOUNTER — Encounter: Payer: Self-pay | Admitting: Cardiology

## 2013-12-23 ENCOUNTER — Encounter: Payer: Self-pay | Admitting: Cardiology

## 2013-12-23 ENCOUNTER — Ambulatory Visit (INDEPENDENT_AMBULATORY_CARE_PROVIDER_SITE_OTHER): Payer: Medicare Other | Admitting: Cardiology

## 2013-12-23 VITALS — BP 132/72 | HR 54 | Ht 70.0 in | Wt 197.0 lb

## 2013-12-23 DIAGNOSIS — E785 Hyperlipidemia, unspecified: Secondary | ICD-10-CM

## 2013-12-23 DIAGNOSIS — I350 Nonrheumatic aortic (valve) stenosis: Secondary | ICD-10-CM

## 2013-12-23 DIAGNOSIS — I359 Nonrheumatic aortic valve disorder, unspecified: Secondary | ICD-10-CM

## 2013-12-23 DIAGNOSIS — R55 Syncope and collapse: Secondary | ICD-10-CM

## 2013-12-23 DIAGNOSIS — R001 Bradycardia, unspecified: Secondary | ICD-10-CM | POA: Insufficient documentation

## 2013-12-23 DIAGNOSIS — I2510 Atherosclerotic heart disease of native coronary artery without angina pectoris: Secondary | ICD-10-CM

## 2013-12-23 DIAGNOSIS — I739 Peripheral vascular disease, unspecified: Secondary | ICD-10-CM

## 2013-12-23 DIAGNOSIS — E8770 Fluid overload, unspecified: Secondary | ICD-10-CM

## 2013-12-23 DIAGNOSIS — I779 Disorder of arteries and arterioles, unspecified: Secondary | ICD-10-CM

## 2013-12-23 DIAGNOSIS — I10 Essential (primary) hypertension: Secondary | ICD-10-CM

## 2013-12-23 DIAGNOSIS — I498 Other specified cardiac arrhythmias: Secondary | ICD-10-CM

## 2013-12-23 DIAGNOSIS — E8779 Other fluid overload: Secondary | ICD-10-CM

## 2013-12-23 MED ORDER — METOPROLOL SUCCINATE ER 100 MG PO TB24: 100.0000 mg | ORAL_TABLET | Freq: Every day | ORAL | Status: DC

## 2013-12-23 NOTE — Assessment & Plan Note (Signed)
Coronary disease is stable. We know from his last cath of May, 2014 that his grafts were patent.

## 2013-12-23 NOTE — Assessment & Plan Note (Signed)
Patient has mild iatrogenic bradycardia on metoprolol 150 mg daily. His dose will be reduced to 100 mg by mouth. I do not feel that he has any increased risk of arrhythmias

## 2013-12-23 NOTE — Assessment & Plan Note (Signed)
Blood pressure is stable. No change in therapy. 

## 2013-12-23 NOTE — Assessment & Plan Note (Signed)
He's had a carotid endarterectomy on the left. He is followed by the vascular surgery office and is stable.

## 2013-12-23 NOTE — Progress Notes (Signed)
Patient ID: [name], male   DOB: Jul 16, 1931, 80 y.o.   MRN: [id number]    HPI  Patient is seen today to follow up coronary disease and a remote episode of syncope. I saw him last October, 2014. In July 2 014 we have completed the evaluation of his syncopal episode. We think that most likely he was dehydrated when playing golf. We had noted some very limited ventricular tachycardia while walking on the treadmill off beta blockers. Because of this catheterization was done in his coronaries were stable. Since that time he has had no recurrent syncope. He exercises regularly. He is feeling well in general. He has noted that his heart rate is on the slow side at rest. He is on metoprolol 150 mg and a long-acting format.  Allergies  Allergen Reactions  . Propylene Glycol Rash    Current Outpatient Prescriptions  Medication Sig Dispense Refill  . allopurinol (ZYLOPRIM) 300 MG tablet Take 600 mg by mouth daily.       Marland Kitchen aspirin EC 81 MG tablet Take 81 mg by mouth daily.      Marland Kitchen atorvastatin (LIPITOR) 20 MG tablet Take 20 mg by mouth every evening.       . latanoprost (XALATAN) 0.005 % ophthalmic solution Place 1 drop into both eyes at bedtime.      Marland Kitchen levothyroxine (SYNTHROID, LEVOTHROID) 125 MCG tablet Take 125 mcg by mouth daily.       . metoprolol (TOPROL-XL) 100 MG 24 hr tablet Take 150 mg by mouth daily.       . multivitamin (THERAGRAN) per tablet Take 1 tablet by mouth daily.       . nitroGLYCERIN (NITROSTAT) 0.4 MG SL tablet Place 0.4 mg under the tongue every 5 (five) minutes x 3 doses as needed. For chest pain      . omeprazole (PRILOSEC) 20 MG capsule Take 20 mg by mouth daily.       Marland Kitchen pyridOXINE (VITAMIN B-6) 100 MG tablet Take 100 mg by mouth daily.       . Tamsulosin HCl (FLOMAX) 0.4 MG CAPS Take 0.4 mg by mouth daily.       . valsartan (DIOVAN) 320 MG tablet Take 320 mg by mouth daily.        No current facility-administered medications for this visit.    History   Social  History  . Marital Status: Married    Spouse Name: N/A    Number of Children: N/A  . Years of Education: N/A   Occupational History  . Not on file.   Social History Main Topics  . Smoking status: Never Smoker   . Smokeless tobacco: Never Used  . Alcohol Use: Yes     Comment: Occasional  . Drug Use: No  . Sexual Activity: Not Currently   Other Topics Concern  . Not on file   Social History Narrative  . No narrative on file    Family History  Problem Relation Age of Onset  . Colon cancer Neg Hx   . Heart attack Mother   . Heart disease Mother     Heart Disease before age 61  . Heart disease Father     Heart Disease before age 16  . Hypertension Father   . Heart attack Son     Past Medical History  Diagnosis Date  . Hyperlipidemia   . Benign prostatic hypertrophy   . Carotid artery disease     DES, SVG to circumflex marginal, October,  2010 ( SVG to PDA and PLA patent , LIMA to LAD patent, EF 60%, mild inferior hypokinesis; H/o mild CHF, CABG  . CAD (coronary artery disease)     DES, SVG to circumflex marginal, October, 2010 ( SVG to PDA and PLA patent , LIMA to LAD patent, EF 60%, mild inferior hypokinesis  . Syncope     Remote, etiology unknown  . Psoriasis     severe; with total skin exfoliation in the past resolved  . Hx of decompressive lumbar laminectomy     Dr.Botero December, 2011  . Aortic stenosis     Mild, echo, August, 2012  . HTN (hypertension)     takes meds daily  . Hypothyroidism   . Nephrolithiasis   . GERD (gastroesophageal reflux disease)     takes daily  . Arthritis   . Neuromuscular disorder   . PVC's (premature ventricular contractions)     May, 2014  . Ventricular tachycardia, non-sustained     Past Surgical History  Procedure Laterality Date  . Coronary artery bypass graft  1995  . Decompression of left median nerve    . Left carotid endarectomy and dacron patch angioplasty]    . Bilateral l3-4 laminectomies    . Removal of  synovial cyst    . Posterior arthrodesis with autograft and allograft    . Colonoscopy  11/23/2011    Procedure: COLONOSCOPY;  Surgeon: Rogene Houston, MD;  Location: [facility];  Service: Endoscopy;  Laterality: N/A;  730  . Back surgery      x3  . Tonsillectomy    . Appendectomy    . Knee arthroplasty  04/16/2012    left total  . Total knee arthroplasty  04/16/2012    Procedure: TOTAL KNEE ARTHROPLASTY;  Surgeon: Garald Balding, MD;  Location: [facility];  Service: Orthopedics;  Laterality: Left;  Marland Kitchen Eye surgery      cataracts; bilateral  . Joint replacement  Sept. 27, 2013    Left Knee  . Spine surgery      X's 3  . Carotid endarterectomy Left 02-24-08    cea  Dr. Amedeo Plenty    Patient Active Problem List   Diagnosis Date Noted  . Aftercare following surgery of the circulatory system, [place] 06/30/2013  . PVC's (premature ventricular contractions)   . Neuromuscular disorder   . Nephrolithiasis   . Hypothyroidism   . Ventricular tachycardia, non-sustained   . Osteoarthritis of knee 04/19/2012  . Hyponatremia 04/19/2012  . Hypokalemia 04/19/2012  . Postoperative anemia due to acute blood loss 04/19/2012  . Aortic stenosis   . Occlusion and stenosis of carotid artery without mention of cerebral infarction 02/13/2012  . Carotid artery disease   . Hyperlipidemia   . HTN (hypertension)   . CAD (coronary artery disease)   . Syncope   . Psoriasis   . Ejection fraction   . Fluid overload   . Carpal tunnel syndrome   . Hx of CABG   . BENIGN PROSTATIC HYPERTROPHY, HX OF 06/11/2009    ROS   Patient denies fever, chills, headache, sweats, rash, change in vision, change in hearing, chest pain, cough, nausea or vomiting, urinary symptoms. All other systems are reviewed and are negative.  PHYSICAL EXAM  Patient is quite stable. He is oriented to person time and place. Affect is normal. Head is atraumatic. Sclera and conjunctiva are normal. There is no jugulovenous distention. Lungs are  clear. Respiratory effort is nonlabored. Cardiac exam reveals S1 and  S2. There is a systolic murmur. The abdomen is soft. There is no peripheral edema.  Filed Vitals:   12/23/13 1606  BP: 132/72  Pulse: 54  Height: 5\' 10"  (1.778 m)  Weight: 197 lb (89.359 kg)   EKG is done today and reviewed by me. There is mild sinus bradycardia. They're old diffuse nonspecific ST-T wave changes. One PVC is noted.  ASSESSMENT & PLAN

## 2013-12-23 NOTE — Assessment & Plan Note (Signed)
On an infrequent basis he notices some volume overload. He takes one dose of diuretic and improves without problems.

## 2013-12-23 NOTE — Assessment & Plan Note (Signed)
The patient's lipids are treated appropriately.

## 2013-12-23 NOTE — Assessment & Plan Note (Signed)
The patient has mild aortic stenosis. He does not need an echo at this time. He needs an echo in another year.

## 2013-12-23 NOTE — Assessment & Plan Note (Signed)
He has had no recurrent syncope. We think most likely it was dehydration while playing golf.

## 2013-12-23 NOTE — Patient Instructions (Signed)
Your physician has recommended you make the following change in your medication: Decrease Toprol XL (Metoprolol) to 100 mg daily  Your physician wants you to follow-up in: 6 months. You will receive a reminder letter in the mail two months in advance. If you don't receive a letter, please call our office to schedule the follow-up appointment.

## 2014-04-01 ENCOUNTER — Encounter: Payer: Self-pay | Admitting: Cardiology

## 2014-05-15 ENCOUNTER — Encounter: Payer: Self-pay | Admitting: Cardiology

## 2014-05-15 NOTE — Progress Notes (Signed)
Patient ID: [name], male   DOB: 03-02-1931, 80 y.o.   MRN: [id number]   I received a phone call today from Dr. Willey Blade letting me know that the patient has had recurrent syncope. I've also called and spoken with the patient.  In April, 2014 the patient had syncope. The workup at that time included a nuclear scan suggesting some ischemia. He also had some nonsustained ventricular tachycardia with limited stress at that time. Therefore he underwent cardiac catheterization. His grafts were patent. There was no obvious ischemia. We tried to have him wear an event recorder. The recorder was placed. The patient remembers that we were unable to get the monitor to work properly. My notes relate that the patient was not able to wear it reliably. There is no further information available. I have seen the patient in the office over time since then he has been stable. It is of note that his first episode occurred last year on a day that he had played golf in very hot weather.  Recently he has had some presyncopal episodes. He exercises regularly on a treadmill at an exercise facility. In the past few days he was doing his usual exercise program. He had a very slight sensation of not feeling properly and he then had syncope. Fortunately there was no marked injury. The episode was not witnessed.  Issues related to this include:  There has been sinus bradycardia at rest in the past related to his dose of metoprolol. This was decreased from 150-100 and he has not had any documented marked bradycardia since then.   His last echo was April, 2015. Ejection fraction was low normal at that time. We will repeat his echo to reassess his LV function and his aortic valve.  There is a history of aortic stenosis. However this was very mild both by echo and by catheterization one year ago. This can be reassessed by echo, but it is very unlikely that this is causing his problem.  As mentioned above he has coronary disease.  His catheterization done May, 2015 did not show any significant stenosis around the time that he had syncope and then.   Will arrange for followup 2-D echo. He definitely needs to be monitored. I have been talking with Claiborne Billings in EP concerning whether he would qualify for a LINQ implantable loop recorder. My personal bias is that this will be the best way to approach his current problem. We were unsuccessful in trying to have him wear a prolonged event recorder in April, 2015. Electrophysiology team will have to make the final decision about whether he can qualify for a LINQ.  This is a very reliable and intelligent patient. We need to be aggressive with evaluation of his syncope.  Daryel November, MD

## 2014-05-20 ENCOUNTER — Other Ambulatory Visit: Payer: Self-pay

## 2014-05-20 ENCOUNTER — Encounter: Payer: Self-pay | Admitting: Cardiology

## 2014-05-20 DIAGNOSIS — I25810 Atherosclerosis of coronary artery bypass graft(s) without angina pectoris: Secondary | ICD-10-CM

## 2014-05-20 NOTE — Progress Notes (Signed)
Patient ID: [name], male   DOB: September 04, 1930, 80 y.o.   MRN: [id number]  There has been careful review and discussion with the electrophysiology team. The patient does qualify for an implantable loop recorder. He will be contacted by our office to move forward with this. I called to notify the patient. He understands and agrees.   Daryel November, MD

## 2014-05-22 ENCOUNTER — Ambulatory Visit (HOSPITAL_COMMUNITY)
Admission: RE | Admit: 2014-05-22 | Discharge: 2014-05-22 | Disposition: A | Discharge status: Home / Self Care | Payer: Medicare Other | Source: Ambulatory Visit | Attending: Cardiology | Admitting: Cardiology

## 2014-05-22 DIAGNOSIS — E785 Hyperlipidemia, unspecified: Secondary | ICD-10-CM | POA: Diagnosis not present

## 2014-05-22 DIAGNOSIS — I2510 Atherosclerotic heart disease of native coronary artery without angina pectoris: Secondary | ICD-10-CM | POA: Insufficient documentation

## 2014-05-22 DIAGNOSIS — I350 Nonrheumatic aortic (valve) stenosis: Secondary | ICD-10-CM | POA: Insufficient documentation

## 2014-05-22 DIAGNOSIS — I369 Nonrheumatic tricuspid valve disorder, unspecified: Secondary | ICD-10-CM

## 2014-05-22 DIAGNOSIS — I25810 Atherosclerosis of coronary artery bypass graft(s) without angina pectoris: Secondary | ICD-10-CM

## 2014-05-22 DIAGNOSIS — I10 Essential (primary) hypertension: Secondary | ICD-10-CM | POA: Insufficient documentation

## 2014-05-22 NOTE — Progress Notes (Signed)
  Echocardiogram 2D Echocardiogram has been performed.  Darlina Sicilian M 05/22/2014, 10:05 AM

## 2014-05-27 ENCOUNTER — Encounter: Payer: Self-pay | Admitting: Cardiology

## 2014-06-03 ENCOUNTER — Encounter: Payer: Self-pay | Admitting: Internal Medicine

## 2014-06-03 ENCOUNTER — Ambulatory Visit (INDEPENDENT_AMBULATORY_CARE_PROVIDER_SITE_OTHER): Payer: Medicare Other | Admitting: Internal Medicine

## 2014-06-03 ENCOUNTER — Encounter (INDEPENDENT_AMBULATORY_CARE_PROVIDER_SITE_OTHER): Payer: Medicare Other

## 2014-06-03 VITALS — BP 138/74 | HR 71 | Ht 69.5 in | Wt 198.0 lb

## 2014-06-03 DIAGNOSIS — R55 Syncope and collapse: Secondary | ICD-10-CM

## 2014-06-03 DIAGNOSIS — I350 Nonrheumatic aortic (valve) stenosis: Secondary | ICD-10-CM

## 2014-06-03 DIAGNOSIS — I2510 Atherosclerotic heart disease of native coronary artery without angina pectoris: Secondary | ICD-10-CM

## 2014-06-03 NOTE — Progress Notes (Addendum)
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 male with a h/o recurrent unexplained syncope who presents for EP consultation.  The patient had a syncopal episode in May, 2014. He says that he had played golf on a hot day. He then went home and realizes he left his cell phone and return to the golf course. While walking towards the clubhouse he began to feel weak and then had syncope. There was a brief prodrome. After this time he had a nuclear scan to rule out the possibility of ischemia. On the scan he had some PVCs and a very short bursts of ventricular tachycardia for which he was asymptomatic.  In addition attempt was made for him to wear an event recorder. He actually only had it on for a day or 2 and there were difficulties with it. Decision was made for him to not wear it any longer.  He did well until recently.  He has had another episode of syncope recently.  He reports abrupt loss of consciousness without warning.  He is quite alarmed by these episodes and is referred for EP evaluation.    Today, he denies symptoms of palpitations, chest pain, shortness of breath, orthopnea, PND, lower extremity edema,  or neurologic sequela. The patient is tolerating medications without difficulties and is otherwise without complaint today.   Past Medical History  Diagnosis Date  . Hyperlipidemia   . Benign prostatic hypertrophy   . Carotid artery disease     DES, SVG to circumflex marginal, October, 2010 ( SVG to PDA and PLA patent , LIMA to LAD patent, EF 60%, mild inferior hypokinesis; H/o mild CHF, CABG  . CAD (coronary artery disease)     DES, SVG to circumflex marginal, October, 2010 ( SVG to PDA and PLA patent , LIMA to LAD patent, EF 60%, mild inferior hypokinesis  . Syncope     Remote, etiology unknown  . Psoriasis     severe; with total skin exfoliation in the past resolved  . Hx of decompressive lumbar laminectomy     Dr.Botero  December, 2011  . Aortic stenosis     Mild, echo, August, 2012  . HTN (hypertension)     takes meds daily  . Hypothyroidism   . Nephrolithiasis   . GERD (gastroesophageal reflux disease)     takes daily  . Arthritis   . Neuromuscular disorder   . PVC's (premature ventricular contractions)     May, 2014  . Ventricular tachycardia, non-sustained    Past Surgical History  Procedure Laterality Date  . Coronary artery bypass graft  1995  . Decompression of left median nerve    . Left carotid endarectomy and dacron patch angioplasty]    . Bilateral l3-4 laminectomies    . Removal of synovial cyst    . Posterior arthrodesis with autograft and allograft    . Colonoscopy  11/23/2011    Procedure: COLONOSCOPY;  Surgeon: Rogene Houston, MD;  [facility];  Service: Endoscopy;  Laterality: N/A;  730  . Back surgery      x3  . Tonsillectomy    . Appendectomy    . Knee arthroplasty  04/16/2012    left total  . Total knee arthroplasty  04/16/2012    Procedure: TOTAL KNEE ARTHROPLASTY;  Surgeon: Garald Balding, MD;  Location: [facility];  Service: Orthopedics;  Laterality: Left;  Marland Kitchen Eye surgery      cataracts;  bilateral  . Joint replacement  Sept. 27, 2013    Left Knee  . Spine surgery      X's 3  . Carotid endarterectomy Left 02-24-08    cea  Dr. Amedeo Plenty    Current Outpatient Prescriptions  Medication Sig Dispense Refill  . allopurinol (ZYLOPRIM) 300 MG tablet Take 600 mg by mouth daily.       Marland Kitchen aspirin EC 81 MG tablet Take 81 mg by mouth daily.      Marland Kitchen atorvastatin (LIPITOR) 20 MG tablet Take 20 mg by mouth every evening.       . fluticasone (FLONASE) 50 MCG/ACT nasal spray Place 1 spray into both nostrils 2 (two) times daily.      Marland Kitchen latanoprost (XALATAN) 0.005 % ophthalmic solution Place 1 drop into both eyes at bedtime.      Marland Kitchen levothyroxine (SYNTHROID, LEVOTHROID) 112 MCG tablet Take 112 mcg by mouth daily before breakfast.      . metoprolol succinate (TOPROL-XL) 100 MG 24 hr  tablet Take 1 tablet (100 mg total) by mouth daily.  90 tablet  1  . multivitamin (THERAGRAN) per tablet Take 1 tablet by mouth daily.       . nitroGLYCERIN (NITROSTAT) 0.4 MG SL tablet Place 0.4 mg under the tongue every 5 (five) minutes x 3 doses as needed. For chest pain      . omeprazole (PRILOSEC) 20 MG capsule Take 20 mg by mouth daily.       Marland Kitchen pyridOXINE (VITAMIN B-6) 100 MG tablet Take 100 mg by mouth daily.       . Tamsulosin HCl (FLOMAX) 0.4 MG CAPS Take 0.4 mg by mouth daily.       . valsartan (DIOVAN) 320 MG tablet Take 320 mg by mouth daily.        No current facility-administered medications for this visit.    Allergies  Allergen Reactions  . Propylene Glycol Hives and Rash    History   Social History  . Marital Status: Married    Spouse Name: N/A    Number of Children: N/A  . Years of Education: N/A   Occupational History  . Not on file.   Social History Main Topics  . Smoking status: Never Smoker   . Smokeless tobacco: Never Used  . Alcohol Use: Yes     Comment: Occasional  . Drug Use: No  . Sexual Activity: Not Currently   Other Topics Concern  . Not on file   Social History Narrative  . No narrative on file    Family History  Problem Relation Age of Onset  . Colon cancer Neg Hx   . Heart attack Mother   . Heart disease Mother     Heart Disease before age 46  . Heart disease Father     Heart Disease before age 26  . Hypertension Father   . Heart attack Son     ROS- All systems are reviewed and negative except as per the HPI above  Physical Exam: Filed Vitals:   06/03/14 1018  BP: 138/74  Pulse: 71  Height: 5' 9.5" (1.765 m)  Weight: 198 lb (89.812 kg)  SpO2: 94%    GEN- The patient is elderly appearing, alert and oriented x 3 today.   Head- normocephalic, atraumatic Eyes-  Sclera clear, conjunctiva pink Ears- hearing intact Oropharynx- clear Neck- supple, no JVP Lymph- no cervical lymphadenopathy Lungs- Clear to ausculation  bilaterally, normal work of breathing Heart- Regular rate and rhythm,  2/6 SEM LUSB which is mid peaking GI- soft, NT, ND, + BS Extremities- no clubbing, cyanosis, or edema MS- no significant deformity or atrophy Skin- no rash or lesion Psych- euthymic mood, full affect Neuro- strength and sensation are intact  EKG9/15 reveals sinus rhythm with long first degree AV block Echo is reviewed Epic records including Dr Ron Parker' notes are reviewed  Assessment and Plan:  1. Recurrent unexplained syncope The patient has had recurrent syncope.  This is abrupt and onset and could be arrhythmogenic in nature.  He has had NSVT observed and also has a long first degree AV block on ekg.  Echo is reviewed.  He has worn an event monitor previously during which he did not have syncope. I would therefore recommend implantable loop recorder placement at this time. I spoke at length with the patient about monitoring for afib with either a 30 day event monitor or an implantable loop recorder.  Risks, benefits, and alteratives to implantable loop recorder were discussed with the patient today.   At this time, the patient is very clear in their decision to proceed with implantable loop recorder.  He will be offered enrollment in RIO II study.  2. CAD Stable No change required today  3. AS Moderate by exam Echo reviewed    Addendum 06/03/2014 1:49 PM SURGEON:  [name], MD     PREPROCEDURE DIAGNOSIS:  Unexplained syncope    POSTPROCEDURE DIAGNOSIS:  Unexplained syncope     PROCEDURES:   1. Implantable loop recorder implantation    INTRODUCTION:  JANN RA is a 80 y.o. male with a history of recurrent unexplained syncope who presents today for implantable loop implantation.  Despite an extensive workup, no reversible causes have been identified.  he has worn telemetry during which he did not have arrhythmias.  There is significant concern for arrhythmia as the cause for the patients syncope.   The patient therefore presents today for implantable loop implantation.     DESCRIPTION OF PROCEDURE:  Informed written consent was obtained.  The procedure was performed in the ambulatory setting with RIO II research protocol.  The patient required no sedation for the procedure today.  Mapping over the patient's chest was performed to identify the area where electrograms were most prominent for ILR recording.  This area was found to be the left parasternal region over the 3rd-4th intercostal space. The patients left chest was therefore prepped and draped in the usual sterile fashion. The skin overlying the left parasternal region was infiltrated with lidocaine for local analgesia.  A 0.5-cm incision was made over the left parasternal region over the 3rd intercostal space.  A subcutaneous ILR pocket was fashioned using a combination of sharp and blunt dissection.  A Medtronic Reveal [place] model [id number] implantable loop recorder was then placed into the pocket  R waves were very prominent and measured 0.90mV.EBL<17ml.  Steri- Strips and a sterile dressing were then applied.  There were no early apparent complications.     CONCLUSIONS:   1. Successful implantation of a Medtronic Reveal LINQ implantable loop recorder for recurrent unexplained syncope  2. No early apparent complications.    No driving x 6 months following a syncopal episode as per DMV's recommendation (pt was informed by me)

## 2014-06-03 NOTE — Patient Instructions (Signed)
Your physician recommends that you schedule a follow-up appointment in: 10 days in device clinic  And 1 month with Dr [name] and Darnelle Bos

## 2014-06-04 NOTE — Progress Notes (Signed)
RIO 2 Informed Consent  Subject Name: [name]   Subject met inclusion and exclusion criteria. The informed consent form, study requirements and expectations were reviewed with the subject and questions and concerns were addressed prior to the signing of the consent form. The subject verbalized understanding of the trail requirements. The subject agreed to participate in the RIO 2 trial and signed the informed consent. The informed consent was obtained prior to performance of any protocol-specific procedures for the subject. A copy of the signed informed consent was given to the subject and a copy was placed in the subject's medical record.   Raquel Sarna Meryl Ponder  06/03/2014 @ 11:00

## 2014-06-15 ENCOUNTER — Encounter: Payer: Self-pay | Admitting: Internal Medicine

## 2014-06-15 ENCOUNTER — Ambulatory Visit (INDEPENDENT_AMBULATORY_CARE_PROVIDER_SITE_OTHER): Payer: Medicare Other | Admitting: *Deleted

## 2014-06-15 DIAGNOSIS — R55 Syncope and collapse: Secondary | ICD-10-CM

## 2014-06-15 LAB — MDC_IDC_ENUM_SESS_TYPE_INCLINIC

## 2014-06-15 NOTE — Progress Notes (Signed)
Wound check - ILR.  Steri strips removed.  Wound well healed.  5 pause episodes 3-4 seconds without symptoms. R-waves 0.52-0.58mV.   ROV 10/29 with Dr. [name].

## 2014-06-18 ENCOUNTER — Ambulatory Visit (INDEPENDENT_AMBULATORY_CARE_PROVIDER_SITE_OTHER): Payer: Medicare Other | Admitting: Internal Medicine

## 2014-06-18 ENCOUNTER — Encounter: Payer: Self-pay | Admitting: Internal Medicine

## 2014-06-18 VITALS — BP 158/62 | HR 54 | Ht 69.0 in | Wt 198.2 lb

## 2014-06-18 DIAGNOSIS — I119 Hypertensive heart disease without heart failure: Secondary | ICD-10-CM

## 2014-06-18 DIAGNOSIS — I442 Atrioventricular block, complete: Secondary | ICD-10-CM

## 2014-06-18 DIAGNOSIS — R55 Syncope and collapse: Secondary | ICD-10-CM

## 2014-06-18 LAB — MDC_IDC_ENUM_SESS_TYPE_INCLINIC
Date Time Interrogation Session: 20151029151116
MDC IDC SET ZONE DETECTION INTERVAL: 3000 ms
Zone Setting Detection Interval: 2000 ms
Zone Setting Detection Interval: 400 ms

## 2014-06-18 MED ORDER — METOPROLOL SUCCINATE ER 25 MG PO TB24: 25.0000 mg | ORAL_TABLET | Freq: Every day | ORAL | Status: DC

## 2014-06-18 NOTE — Patient Instructions (Addendum)
Your physician has recommended you make the following change in your medication:  1) Decrease metoprolol to 50 mg daily x 2 weeks, 2) then decrease metoprolol to 25 mg daily   Your physician recommends that you follow-up as scheduled: 07/08/14 with Dr. [name].

## 2014-06-18 NOTE — Progress Notes (Signed)
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  Since last being seen in our clinic, the patient reports doing very well.  He has had no symptoms of presyncope or syncope.  His ILR site is healing nicely.  He did have transient AV block with consecutive P waves not conducted and a 4 second RR interval.  This occurred 10/18 at 10:15 am.  He remembers this time clearly and states that he was fishing with his son.  He is clear that he had no symptoms with the episode.   Today, he denies symptoms of palpitations, chest pain, shortness of breath,  lower extremity edema, dizziness, presyncope, or syncope.  The patient is otherwise without complaint today.   Past Medical History  Diagnosis Date  . Hyperlipidemia   . Benign prostatic hypertrophy   . Carotid artery disease     DES, SVG to circumflex marginal, October, 2010 ( SVG to PDA and PLA patent , LIMA to LAD patent, EF 60%, mild inferior hypokinesis; H/o mild CHF, CABG  . CAD (coronary artery disease)     DES, SVG to circumflex marginal, October, 2010 ( SVG to PDA and PLA patent , LIMA to LAD patent, EF 60%, mild inferior hypokinesis  . Syncope     Remote, etiology unknown  . Psoriasis     severe; with total skin exfoliation in the past resolved  . Hx of decompressive lumbar laminectomy     Dr.Botero December, 2011  . Aortic stenosis     Mild, echo, August, 2012  . HTN (hypertension)     takes meds daily  . Hypothyroidism   . Nephrolithiasis   . GERD (gastroesophageal reflux disease)     takes daily  . Arthritis   . Neuromuscular disorder   . PVC's (premature ventricular contractions)     May, 2014  . Ventricular tachycardia, non-sustained    Past Surgical History  Procedure Laterality Date  . Coronary artery bypass graft  1995  . Decompression of left median nerve    . Left carotid endarectomy and dacron patch angioplasty]    . Bilateral l3-4  laminectomies    . Removal of synovial cyst    . Posterior arthrodesis with autograft and allograft    . Colonoscopy  11/23/2011    Procedure: COLONOSCOPY;  Surgeon: Rogene Houston, MD;  [facility];  Service: Endoscopy;  Laterality: N/A;  730  . Back surgery      x3  . Tonsillectomy    . Appendectomy    . Knee arthroplasty  04/16/2012    left total  . Total knee arthroplasty  04/16/2012    Procedure: TOTAL KNEE ARTHROPLASTY;  Surgeon: Garald Balding, MD;  Location: [facility];  Service: Orthopedics;  Laterality: Left;  Marland Kitchen Eye surgery      cataracts; bilateral  . Joint replacement  Sept. 27, 2013    Left Knee  . Spine surgery      X's 3  . Carotid endarterectomy Left 02-24-08    cea  Dr. Amedeo Plenty    ROS- all systems are reviewed and negatives except as per HPI above  Current Outpatient Prescriptions  Medication Sig Dispense Refill  . allopurinol (ZYLOPRIM) 300 MG tablet Take 600 mg by mouth daily.       Marland Kitchen aspirin EC 81 MG tablet Take 81 mg by mouth daily.      Marland Kitchen atorvastatin (LIPITOR) 20  MG tablet Take 20 mg by mouth every evening.       . fluticasone (FLONASE) 50 MCG/ACT nasal spray Place 1 spray into both nostrils 2 (two) times daily.      Marland Kitchen latanoprost (XALATAN) 0.005 % ophthalmic solution Place 1 drop into both eyes at bedtime.      Marland Kitchen levothyroxine (SYNTHROID, LEVOTHROID) 112 MCG tablet Take 112 mcg by mouth daily before breakfast.      . metoprolol succinate (TOPROL-XL) 100 MG 24 hr tablet Take 1 tablet (100 mg total) by mouth daily.  90 tablet  1  . multivitamin (THERAGRAN) per tablet Take 1 tablet by mouth daily.       . nitroGLYCERIN (NITROSTAT) 0.4 MG SL tablet Place 0.4 mg under the tongue every 5 (five) minutes x 3 doses as needed. For chest pain      . omeprazole (PRILOSEC) 20 MG capsule Take 20 mg by mouth daily.       Marland Kitchen pyridOXINE (VITAMIN B-6) 100 MG tablet Take 100 mg by mouth daily.       . Tamsulosin HCl (FLOMAX) 0.4 MG CAPS Take 0.4 mg by mouth daily.        . valsartan (DIOVAN) 320 MG tablet Take 320 mg by mouth daily.        No current facility-administered medications for this visit.    Physical Exam: Filed Vitals:   06/18/14 1103  BP: 158/62  Pulse: 54  Height: 5\' 9"  (1.753 m)  Weight: 198 lb 3.2 oz (89.903 kg)    GEN- The patient is well appearing, alert and oriented x 3 today.   Head- normocephalic, atraumatic Eyes-  Sclera clear, conjunctiva pink Ears- hearing intact Oropharynx- clear Lungs- Clear to ausculation bilaterally, normal work of breathing Heart- Regular rate and rhythm, 2/.6 SEM LUSB (early peaking) GI- soft, NT, ND, + BS Extremities- no clubbing, cyanosis, or edema Minor ecchymosis over his ILR implant site  ILR interrogation is reviewed today  Assessment and Plan:  1. H/o syncope and transient complete heart block on ILR documented 10/18 without associated symptoms I would like to get him off of beta blocker therapy (a reversible cause for his AV block).  I have spoken with Dr Ron Parker who agrees. I will therefore decrease toprol to 50mg  daily today and then to 25mg  daily in 2 weeks.  He will follow closely with both myself and Dr Ron Parker and we will try to wean him off of toprol more quickly if possible (he could decrease to 25mg  daily when he sees Dr Ron Parker next if he is doing well at that time and then stop it when I see him later this month). He is instructed to contact my office immediately should he have any symptoms of bradycardia. We will continue to monitor very closely with his ILR device.  2. Hypertensive cardiovascular disease bp is slightly elevated today Will need to follow closely as we wean off of toprol  Continue carelink remote monitoring Follow-up with Dr Ron Parker next week as scheduled and with me in mid November.

## 2014-06-18 NOTE — Addendum Note (Signed)
Addended by: Alvis Lemmings C on: Nov 19, 202015 11:41 AM   Modules accepted: Orders, Medications

## 2014-06-24 ENCOUNTER — Ambulatory Visit (INDEPENDENT_AMBULATORY_CARE_PROVIDER_SITE_OTHER): Payer: Medicare Other | Admitting: Cardiology

## 2014-06-24 ENCOUNTER — Encounter: Payer: Self-pay | Admitting: Cardiology

## 2014-06-24 VITALS — BP 140/72 | HR 80 | Ht 69.0 in | Wt 200.2 lb

## 2014-06-24 DIAGNOSIS — R55 Syncope and collapse: Secondary | ICD-10-CM

## 2014-06-24 DIAGNOSIS — R001 Bradycardia, unspecified: Secondary | ICD-10-CM

## 2014-06-24 NOTE — Progress Notes (Signed)
Patient ID: [name], male   DOB: 1931-06-14, 80 y.o.   MRN: [id number]    HPI  The patient is seen today to follow-up coronary disease, aortic valve disease, syncope. He has an implantable loop recorder in. He is are even seen back by Dr. [name]. He did have some bradycardia but no definite symptoms. The plan now is to reduce his beta blocker and watch his overall response. He does say that he feels well with a lower dose of beta blocker. He thinks he may have some increased energy.  Allergies  Allergen Reactions  . Propylene Glycol Hives and Rash    Current Outpatient Prescriptions  Medication Sig Dispense Refill  . allopurinol (ZYLOPRIM) 300 MG tablet Take 600 mg by mouth daily.     Marland Kitchen aspirin EC 81 MG tablet Take 81 mg by mouth daily.    Marland Kitchen atorvastatin (LIPITOR) 20 MG tablet Take 20 mg by mouth every evening.     . fluticasone (FLONASE) 50 MCG/ACT nasal spray Place 1 spray into both nostrils 2 (two) times daily.    . furosemide (LASIX) 20 MG tablet Take 10 mg by mouth daily as needed for fluid or edema.    Marland Kitchen latanoprost (XALATAN) 0.005 % ophthalmic solution Place 1 drop into both eyes at bedtime.    Marland Kitchen levothyroxine (SYNTHROID, LEVOTHROID) 112 MCG tablet Take 112 mcg by mouth daily before breakfast.    . metoprolol succinate (TOPROL-XL) 25 MG 24 hr tablet Take 1 tablet (25 mg total) by mouth daily. (Patient taking differently: Take 50 mg by mouth daily. ) 30 tablet 6  . multivitamin (THERAGRAN) per tablet Take 1 tablet by mouth daily.     . nitroGLYCERIN (NITROSTAT) 0.4 MG SL tablet Place 0.4 mg under the tongue every 5 (five) minutes x 3 doses as needed. For chest pain    . omeprazole (PRILOSEC) 20 MG capsule Take 20 mg by mouth daily.     Marland Kitchen pyridOXINE (VITAMIN B-6) 100 MG tablet Take 100 mg by mouth daily.     . Tamsulosin HCl (FLOMAX) 0.4 MG CAPS Take 0.4 mg by mouth daily.     . valsartan (DIOVAN) 320 MG tablet Take 320 mg by mouth daily.      No current facility-administered  medications for this visit.    History   Social History  . Marital Status: Married    Spouse Name: N/A    Number of Children: N/A  . Years of Education: N/A   Occupational History  . Not on file.   Social History Main Topics  . Smoking status: Never Smoker   . Smokeless tobacco: Never Used  . Alcohol Use: Yes     Comment: Occasional  . Drug Use: No  . Sexual Activity: Not Currently   Other Topics Concern  . Not on file   Social History Narrative    Family History  Problem Relation Age of Onset  . Colon cancer Neg Hx   . Heart attack Mother   . Heart disease Mother     Heart Disease before age 4  . Heart disease Father     Heart Disease before age 80  . Hypertension Father   . Heart attack Son     Past Medical History  Diagnosis Date  . Hyperlipidemia   . Benign prostatic hypertrophy   . Carotid artery disease     DES, SVG to circumflex marginal, October, 2010 ( SVG to PDA and PLA patent , LIMA to LAD  patent, EF 60%, mild inferior hypokinesis; H/o mild CHF, CABG  . CAD (coronary artery disease)     DES, SVG to circumflex marginal, October, 2010 ( SVG to PDA and PLA patent , LIMA to LAD patent, EF 60%, mild inferior hypokinesis  . Syncope     Remote, etiology unknown  . Psoriasis     severe; with total skin exfoliation in the past resolved  . Hx of decompressive lumbar laminectomy     Dr.Botero December, 2011  . Aortic stenosis     Mild, echo, August, 2012  . HTN (hypertension)     takes meds daily  . Hypothyroidism   . Nephrolithiasis   . GERD (gastroesophageal reflux disease)     takes daily  . Arthritis   . Neuromuscular disorder   . PVC's (premature ventricular contractions)     May, 2014  . Ventricular tachycardia, non-sustained     Past Surgical History  Procedure Laterality Date  . Coronary artery bypass graft  1995  . Decompression of left median nerve    . Left carotid endarectomy and dacron patch angioplasty]    . Bilateral l3-4  laminectomies    . Removal of synovial cyst    . Posterior arthrodesis with autograft and allograft    . Colonoscopy  11/23/2011    Procedure: COLONOSCOPY;  Surgeon: Rogene Houston, MD;  [facility];  Service: Endoscopy;  Laterality: N/A;  730  . Back surgery      x3  . Tonsillectomy    . Appendectomy    . Knee arthroplasty  04/16/2012    left total  . Total knee arthroplasty  04/16/2012    Procedure: TOTAL KNEE ARTHROPLASTY;  Surgeon: Garald Balding, MD;  Location: [facility];  Service: Orthopedics;  Laterality: Left;  Marland Kitchen Eye surgery      cataracts; bilateral  . Joint replacement  Sept. 27, 2013    Left Knee  . Spine surgery      X's 3  . Carotid endarterectomy Left 02-24-08    cea  Dr. Amedeo Plenty    Patient Active Problem List   Diagnosis Date Noted  . Transient complete heart block 2020-06-414  . Hypertensive cardiovascular disease 2020-06-414  . Bradycardia 12/23/2013  . Aftercare following surgery of the circulatory system, [place] 06/30/2013  . PVC's (premature ventricular contractions)   . Neuromuscular disorder   . Nephrolithiasis   . Hypothyroidism   . Ventricular tachycardia, non-sustained   . Osteoarthritis of knee 04/19/2012  . Hyponatremia 04/19/2012  . Hypokalemia 04/19/2012  . Postoperative anemia due to acute blood loss 04/19/2012  . Aortic stenosis   . Occlusion and stenosis of carotid artery without mention of cerebral infarction 02/13/2012  . Carotid artery disease   . Hyperlipidemia   . HTN (hypertension)   . CAD (coronary artery disease)   . Syncope   . Psoriasis   . Ejection fraction   . Fluid overload   . Carpal tunnel syndrome   . Hx of CABG   . BENIGN PROSTATIC HYPERTROPHY, HX OF 06/11/2009    ROS   patient denies fever, chills, headache, sweats, rash, change in vision, change in hearing, chest pain, cough, nausea or vomiting, urinary symptoms. All other systems are reviewed and are negative.  PHYSICAL EXAM  patient is oriented to person time  and place. Affect is normal. Head is atraumatic. Sclera and conjunctiva are normal. There is no jugular venous distention. Lungs are clear. Respiratory effort is nonlabored. Cardiac exam reveals  S1 and S2. There is a systolic murmur. Abdomen is soft. There is no peripheral edema.  Filed Vitals:   06/24/14 0936  BP: 140/72  Pulse: 80  Height: 5\' 9"  (1.753 m)  Weight: 200 lb 3.2 oz (90.81 kg)     ASSESSMENT & PLAN

## 2014-06-24 NOTE — Patient Instructions (Signed)
**Note De-identified [name] Obfuscation** Your physician recommends that you continue on your current medications as directed. Please refer to the Current Medication list given to you today.  Your physician recommends that you schedule a follow-up appointment in: 4 months  

## 2014-06-24 NOTE — Assessment & Plan Note (Signed)
He is beta blockers now being reduced. This is being overseen by Dr. [name]. Originally there was some concern about ventricular ectopy. We'll be able to follow this since he has a LINQ implantable loop in place.

## 2014-06-24 NOTE — Assessment & Plan Note (Signed)
The patient has the loop recorder in place. He did have very limited AV block with bradycardia. Beta blocker is being reduced. Careful follow-up is in place. The patient asked if he could travel to [place]. I have given him permission to travel. However, his wife will do the driving. It is important to note that the patient has never had symptoms sitting down. It is always been when he is standing up, particularly when he starts to rush to do something.

## 2014-07-06 ENCOUNTER — Encounter: Payer: Self-pay | Admitting: Family

## 2014-07-06 ENCOUNTER — Other Ambulatory Visit (HOSPITAL_COMMUNITY): Payer: Medicare Other

## 2014-07-06 ENCOUNTER — Ambulatory Visit: Payer: Medicare Other | Admitting: Family

## 2014-07-07 ENCOUNTER — Encounter: Payer: Self-pay | Admitting: Family

## 2014-07-07 ENCOUNTER — Ambulatory Visit (INDEPENDENT_AMBULATORY_CARE_PROVIDER_SITE_OTHER): Payer: Medicare Other | Admitting: Family

## 2014-07-07 ENCOUNTER — Ambulatory Visit (HOSPITAL_COMMUNITY)
Admission: RE | Admit: 2014-07-07 | Discharge: 2014-07-07 | Disposition: A | Discharge status: Home / Self Care | Payer: Medicare Other | Source: Ambulatory Visit | Attending: Family | Admitting: Family

## 2014-07-07 VITALS — BP 158/82 | HR 69 | Resp 16 | Ht 69.0 in | Wt 199.0 lb

## 2014-07-07 DIAGNOSIS — Z48812 Encounter for surgical aftercare following surgery on the circulatory system: Secondary | ICD-10-CM | POA: Insufficient documentation

## 2014-07-07 DIAGNOSIS — Z9889 Other specified postprocedural states: Secondary | ICD-10-CM

## 2014-07-07 DIAGNOSIS — I6523 Occlusion and stenosis of bilateral carotid arteries: Secondary | ICD-10-CM | POA: Diagnosis not present

## 2014-07-07 NOTE — Patient Instructions (Signed)
Stroke Prevention Some medical conditions and behaviors are associated with an increased chance of having a stroke. You may prevent a stroke by making healthy choices and managing medical conditions. HOW CAN I REDUCE MY RISK OF HAVING A STROKE?   Stay physically active. Get at least 30 minutes of activity on most or all days.  Do not smoke. It may also be helpful to avoid exposure to secondhand smoke.  Limit alcohol use. Moderate alcohol use is considered to be:  No more than 2 drinks per day for men.  No more than 1 drink per day for nonpregnant women.  Eat healthy foods. This involves:  Eating 5 or more servings of fruits and vegetables a day.  Making dietary changes that address high blood pressure (hypertension), high cholesterol, diabetes, or obesity.  Manage your cholesterol levels.  Making food choices that are high in fiber and low in saturated fat, trans fat, and cholesterol may control cholesterol levels.  Take any prescribed medicines to control cholesterol as directed by your health care provider.  Manage your diabetes.  Controlling your carbohydrate and sugar intake is recommended to manage diabetes.  Take any prescribed medicines to control diabetes as directed by your health care provider.  Control your hypertension.  Making food choices that are low in salt (sodium), saturated fat, trans fat, and cholesterol is recommended to manage hypertension.  Take any prescribed medicines to control hypertension as directed by your health care provider.  Maintain a healthy weight.  Reducing calorie intake and making food choices that are low in sodium, saturated fat, trans fat, and cholesterol are recommended to manage weight.  Stop drug abuse.  Avoid taking birth control pills.  Talk to your health care provider about the risks of taking birth control pills if you are over 35 years old, smoke, get migraines, or have ever had a blood clot.  Get evaluated for sleep  disorders (sleep apnea).  Talk to your health care provider about getting a sleep evaluation if you snore a lot or have excessive sleepiness.  Take medicines only as directed by your health care provider.  For some people, aspirin or blood thinners (anticoagulants) are helpful in reducing the risk of forming abnormal blood clots that can lead to stroke. If you have the irregular heart rhythm of atrial fibrillation, you should be on a blood thinner unless there is a good reason you cannot take them.  Understand all your medicine instructions.  Make sure that other conditions (such as anemia or atherosclerosis) are addressed. SEEK IMMEDIATE MEDICAL CARE IF:   You have sudden weakness or numbness of the face, arm, or leg, especially on one side of the body.  Your face or eyelid droops to one side.  You have sudden confusion.  You have trouble speaking (aphasia) or understanding.  You have sudden trouble seeing in one or both eyes.  You have sudden trouble walking.  You have dizziness.  You have a loss of balance or coordination.  You have a sudden, severe headache with no known cause.  You have new chest pain or an irregular heartbeat. Any of these symptoms may represent a serious problem that is an emergency. Do not wait to see if the symptoms will go away. Get medical help at once. Call your local emergency services (911 in [place] Do not drive yourself to the hospital. Document Released: 09/14/2004 Document Revised: 12/22/2013 Document Reviewed: 02/07/2013 Exit[employer] Patient Information 2015 [employer]. This information is not intended to replace advice given   to you by your health care provider. Make sure you discuss any questions you have with your health care provider.  

## 2014-07-07 NOTE — Progress Notes (Signed)
Established Carotid Patient   History of Present Illness  [name] is a 80 y.o. male patient of Dr. Trula Slade who is status post left CEA in 2009.  He returns today for scheduled surveillance. States he exercises at least 3 days/week on a treadmill.  The patient denies any history of TIA or stroke symptoms, specifically the patient denies a history of amaurosis fugax or monocular blindness, denies a history unilateral  facial drooping, denies a history of hemiplegia, and denies a history of receptive or expressive aphasia.  He denies claudication symptoms with walking, denies non healing wounds.   He has an implanted cardiac monitor, his cardiologist is Dr. [name], for arrhythmias and history of rare syncopal episodes that started in 2014. His cardiac medications are being adjusted to address this, a pacemaker is considered if medications do not address this arhythmia adequately, per pt.  Patient report New Medical or Surgical History: left knee replacement in 2013.  Pt Diabetic: No Pt smoker: non-smoker  Pt meds include: Statin : Yes Betablocker: Yes ASA: Yes Other anticoagulants/antiplatelets: no  Past Medical History  Diagnosis Date  . Hyperlipidemia   . Benign prostatic hypertrophy   . Carotid artery disease     DES, SVG to circumflex marginal, October, 2010 ( SVG to PDA and PLA patent , LIMA to LAD patent, EF 60%, mild inferior hypokinesis; H/o mild CHF, CABG  . CAD (coronary artery disease)     DES, SVG to circumflex marginal, October, 2010 ( SVG to PDA and PLA patent , LIMA to LAD patent, EF 60%, mild inferior hypokinesis  . Syncope     Remote, etiology unknown  . Psoriasis     severe; with total skin exfoliation in the past resolved  . Hx of decompressive lumbar laminectomy     Dr.Botero December, 2011  . Aortic stenosis     Mild, echo, August, 2012  . HTN (hypertension)     takes meds daily  . Hypothyroidism   . Nephrolithiasis   . GERD (gastroesophageal  reflux disease)     takes daily  . Arthritis   . Neuromuscular disorder   . PVC's (premature ventricular contractions)     May, 2014  . Ventricular tachycardia, non-sustained     Social History History  Substance Use Topics  . Smoking status: Never Smoker   . Smokeless tobacco: Never Used  . Alcohol Use: Yes     Comment: Occasional    Family History Family History  Problem Relation Age of Onset  . Colon cancer Neg Hx   . Heart attack Mother   . Heart disease Mother     Heart Disease before age 65  . Heart disease Father     Heart Disease before age 60  . Hypertension Father   . Heart attack Son     Surgical History Past Surgical History  Procedure Laterality Date  . Coronary artery bypass graft  1995  . Decompression of left median nerve    . Left carotid endarectomy and dacron patch angioplasty]    . Bilateral l3-4 laminectomies    . Removal of synovial cyst    . Posterior arthrodesis with autograft and allograft    . Colonoscopy  11/23/2011    Procedure: COLONOSCOPY;  Surgeon: Rogene Houston, MD;  [facility];  Service: Endoscopy;  Laterality: N/A;  730  . Back surgery      x3  . Tonsillectomy    . Appendectomy    . Knee arthroplasty  04/16/2012    left total  . Total knee arthroplasty  04/16/2012    Procedure: TOTAL KNEE ARTHROPLASTY;  Surgeon: Garald Balding, MD;  Location: [facility];  Service: Orthopedics;  Laterality: Left;  Marland Kitchen Eye surgery      cataracts; bilateral  . Joint replacement  Sept. 27, 2013    Left Knee  . Spine surgery      X's 3  . Carotid endarterectomy Left 02-24-08    cea  Dr. Amedeo Plenty    Allergies  Allergen Reactions  . Propylene Glycol Hives and Rash    Current Outpatient Prescriptions  Medication Sig Dispense Refill  . allopurinol (ZYLOPRIM) 300 MG tablet Take 600 mg by mouth daily.     Marland Kitchen aspirin EC 81 MG tablet Take 81 mg by mouth daily.    Marland Kitchen atorvastatin (LIPITOR) 20 MG tablet Take 20 mg by mouth every evening.     .  fluticasone (FLONASE) 50 MCG/ACT nasal spray Place 1 spray into both nostrils 2 (two) times daily.    . furosemide (LASIX) 20 MG tablet Take 10 mg by mouth daily as needed for fluid or edema.    Marland Kitchen latanoprost (XALATAN) 0.005 % ophthalmic solution Place 1 drop into both eyes at bedtime.    Marland Kitchen levothyroxine (SYNTHROID, LEVOTHROID) 112 MCG tablet Take 112 mcg by mouth daily before breakfast.    . metoprolol succinate (TOPROL-XL) 25 MG 24 hr tablet Take 1 tablet (25 mg total) by mouth daily. (Patient taking differently: Take 50 mg by mouth daily. ) 30 tablet 6  . multivitamin (THERAGRAN) per tablet Take 1 tablet by mouth daily.     . nitroGLYCERIN (NITROSTAT) 0.4 MG SL tablet Place 0.4 mg under the tongue every 5 (five) minutes x 3 doses as needed. For chest pain    . omeprazole (PRILOSEC) 20 MG capsule Take 20 mg by mouth daily.     Marland Kitchen pyridOXINE (VITAMIN B-6) 100 MG tablet Take 100 mg by mouth daily.     . Tamsulosin HCl (FLOMAX) 0.4 MG CAPS Take 0.4 mg by mouth daily.     . valsartan (DIOVAN) 320 MG tablet Take 320 mg by mouth daily.      No current facility-administered medications for this visit.    Review of Systems : See HPI for pertinent positives and negatives.  Physical Examination  Filed Vitals:   07/07/14 0925 07/07/14 0928  BP: 161/74 158/82  Pulse: 70 69  Resp:  16  Height:  5\' 9"  (1.753 m)  Weight:  199 lb (90.266 kg)  SpO2:  97%   Body mass index is 29.37 kg/(m^2).   General: WDWN male in NAD GAIT: normal Eyes: PERRLA Pulmonary: CTAB, Negative Rales, Negative rhonchi, & Negative wheezing.  Cardiac: regular rhythm with rare dropped beat, positive detected murmur.  VASCULAR EXAM Carotid Bruits Left Right   Transmitted cardiac murmur Transmitted cardiac murmur   Aorta is not palpable. Radial pulses are 2+ palpable and equal.       LE Pulses LEFT RIGHT   POPLITEAL not palpable  not palpable   POSTERIOR TIBIAL not palpable  not palpable    DORSALIS PEDIS  ANTERIOR TIBIAL palpable  palpable     Gastrointestinal: soft, nontender, BS WNL, no r/g, no palpated masses.  Musculoskeletal: Negative muscle atrophy/wasting. M/S 5/5 un UE, 4/5 in LE, Extremities without ischemic changes.  Neurologic: A&O X 3; Appropriate Affect ; SENSATION ;normal;  Speech is normal CN 2-12 intact, Pain and light touch intact in extremities,  Motor exam as listed above.  Non-Invasive Vascular Imaging CAROTID DUPLEX 07/07/2014   CEREBROVASCULAR DUPLEX EVALUATION    INDICATION: Carotid artery disease    PREVIOUS INTERVENTION(S): Left carotid endarterectomy 02/24/2008    DUPLEX EXAM: Carotid duplex    RIGHT  LEFT  Peak Systolic Velocities (cm/s) End Diastolic Velocities (cm/s) Plaque LOCATION Peak Systolic Velocities (cm/s) End Diastolic Velocities (cm/s) Plaque  80 11 HT CCA PROXIMAL 104 18 HT  104 16 - CCA MID 96 21 HT  106 14 CP CCA DISTAL 82 17 HT  179 16 CP ECA 82 7 HT  193 38 CP ICA PROXIMAL 122 20 HM  106 20 - ICA MID 87 27 -  86 24 - ICA DISTAL 100 26 -    1.8 ICA / CCA Ratio (PSV) N/A  Antegrade Vertebral Flow Antegrade  828 Brachial Systolic Pressure (mmHg) 003  Triphasic Brachial Artery Waveforms Triphasic    Plaque Morphology:  HM = Homogeneous, HT = Heterogeneous, CP = Calcific Plaque, SP = Smooth Plaque, IP = Irregular Plaque     ADDITIONAL FINDINGS: Arrhythmias noted.    IMPRESSION: 1. Less than 40% right internal carotid artery stenosis. 2. Patent left carotid endarterectomy site  with no evidence for restenosis.    Compared to the previous exam:  No significant change       Assessment: [name] is a 80 y.o. male  who is status post left CEA in 2009.  He presents with asymptomatic  minimal right ICA stenosis and a patent left carotid endarterectomy site   with no evidence for restenosis. The  ICA stenosis is  Unchanged from previous exam.  Plan: Follow-up in 1 year with Carotid Duplex.   I discussed in depth with the patient the nature of atherosclerosis, and emphasized the importance of maximal medical management including strict control of blood pressure, blood glucose, and lipid levels, obtaining regular exercise, and continued cessation of smoking.  The patient is aware that without maximal medical management the underlying atherosclerotic disease process will progress, limiting the benefit of any interventions. The patient was given information about stroke prevention and what symptoms should prompt the patient to seek immediate medical care. Thank you for allowing [place] to participate in this patient's care.  Clemon Chambers, RN, MSN, FNP-C [facility] Office: [phone]  Clinic Physician: Early  07/07/2014 9:19 AM

## 2014-07-07 NOTE — Addendum Note (Signed)
Addended by: Mena Goes on: 07/07/2014 12:20 PM   Modules accepted: Orders

## 2014-07-08 ENCOUNTER — Encounter: Payer: Self-pay | Admitting: Internal Medicine

## 2014-07-08 ENCOUNTER — Ambulatory Visit (INDEPENDENT_AMBULATORY_CARE_PROVIDER_SITE_OTHER): Payer: Medicare Other | Admitting: Internal Medicine

## 2014-07-08 VITALS — BP 140/90 | HR 90 | Ht 69.0 in | Wt 199.0 lb

## 2014-07-08 DIAGNOSIS — I119 Hypertensive heart disease without heart failure: Secondary | ICD-10-CM

## 2014-07-08 DIAGNOSIS — R55 Syncope and collapse: Secondary | ICD-10-CM

## 2014-07-08 LAB — MDC_IDC_ENUM_SESS_TYPE_INCLINIC

## 2014-07-08 NOTE — Patient Instructions (Signed)
Your physician wants you to follow-up in: 6 months with Dr. Vallery Ridge will receive a reminder letter in the mail two months in advance. If you don't receive a letter, please call our office to schedule the follow-up appointment.  Call device clinic when you get top [place]

## 2014-07-08 NOTE — Progress Notes (Signed)
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  Since last being seen in our clinic, the patient reports doing very well.  He has had no symptoms of presyncope or syncope. Since his last visit, he has had no further AV block.  His energy is "better" with reduced toprol.  Today, he denies symptoms of palpitations, chest pain, shortness of breath,  lower extremity edema, dizziness, presyncope, or syncope.  The patient is otherwise without complaint today.   Past Medical History  Diagnosis Date  . Hyperlipidemia   . Benign prostatic hypertrophy   . Carotid artery disease     DES, SVG to circumflex marginal, October, 2010 ( SVG to PDA and PLA patent , LIMA to LAD patent, EF 60%, mild inferior hypokinesis; H/o mild CHF, CABG  . CAD (coronary artery disease)     DES, SVG to circumflex marginal, October, 2010 ( SVG to PDA and PLA patent , LIMA to LAD patent, EF 60%, mild inferior hypokinesis  . Syncope     Remote, etiology unknown  . Psoriasis     severe; with total skin exfoliation in the past resolved  . Hx of decompressive lumbar laminectomy     Dr.Botero December, 2011  . Aortic stenosis     Mild, echo, August, 2012  . HTN (hypertension)     takes meds daily  . Hypothyroidism   . Nephrolithiasis   . GERD (gastroesophageal reflux disease)     takes daily  . Arthritis   . Neuromuscular disorder   . PVC's (premature ventricular contractions)     May, 2014  . Ventricular tachycardia, non-sustained    Past Surgical History  Procedure Laterality Date  . Coronary artery bypass graft  1995  . Decompression of left median nerve    . Left carotid endarectomy and dacron patch angioplasty]    . Bilateral l3-4 laminectomies    . Removal of synovial cyst    . Posterior arthrodesis with autograft and allograft    . Colonoscopy  11/23/2011    Procedure: COLONOSCOPY;  Surgeon: Rogene Houston, MD;  [facility];  Service: Endoscopy;  Laterality: N/A;  730  . Back surgery      x3  . Tonsillectomy    . Appendectomy    . Knee arthroplasty  04/16/2012    left total  . Total knee arthroplasty  04/16/2012    Procedure: TOTAL KNEE ARTHROPLASTY;  Surgeon: Garald Balding, MD;  Location: [facility];  Service: Orthopedics;  Laterality: Left;  Marland Kitchen Eye surgery      cataracts; bilateral  . Joint replacement  Sept. 27, 2013    Left Knee  . Spine surgery      X's 3  . Carotid endarterectomy Left 02-24-08    cea  Dr. Amedeo Plenty  . Carelink remote monitoring  Oct. 29, 2015    by Dr. [name]  . Carpal tunnel release Bilateral 2010    ROS- all systems are reviewed and negatives except as per HPI above  Current Outpatient Prescriptions  Medication Sig Dispense Refill  . allopurinol (ZYLOPRIM) 300 MG tablet Take 600 mg by mouth daily.     Marland Kitchen aspirin EC 81 MG tablet Take 81 mg by mouth daily.    Marland Kitchen atorvastatin (LIPITOR) 20 MG tablet Take 20 mg by mouth every evening.     . fluticasone (FLONASE) 50 MCG/ACT nasal spray Place 1 spray  into both nostrils 2 (two) times daily.    . furosemide (LASIX) 20 MG tablet Take 10 mg by mouth daily as needed for fluid or edema.    Marland Kitchen latanoprost (XALATAN) 0.005 % ophthalmic solution Place 1 drop into both eyes at bedtime.    Marland Kitchen levothyroxine (SYNTHROID, LEVOTHROID) 112 MCG tablet Take 112 mcg by mouth daily before breakfast.    . metoprolol succinate (TOPROL-XL) 25 MG 24 hr tablet Take 1 tablet (25 mg total) by mouth daily. 30 tablet 6  . multivitamin (THERAGRAN) per tablet Take 1 tablet by mouth daily.     Marland Kitchen omeprazole (PRILOSEC) 20 MG capsule Take 20 mg by mouth daily.     Marland Kitchen pyridOXINE (VITAMIN B-6) 100 MG tablet Take 100 mg by mouth daily.     . Tamsulosin HCl (FLOMAX) 0.4 MG CAPS Take 0.4 mg by mouth daily.     . valsartan (DIOVAN) 320 MG tablet Take 320 mg by mouth daily.     . nitroGLYCERIN (NITROSTAT) 0.4 MG SL tablet Place 0.4 mg under the tongue every 5 (five)  minutes x 3 doses as needed. For chest pain     No current facility-administered medications for this visit.    Physical Exam: Filed Vitals:   07/08/14 1054  BP: 140/90  Pulse: 90  Height: 5\' 9"  (1.753 m)  Weight: 199 lb (90.266 kg)    GEN- The patient is well appearing, alert and oriented x 3 today.   Head- normocephalic, atraumatic Eyes-  Sclera clear, conjunctiva pink Ears- hearing intact Oropharynx- clear Lungs- Clear to ausculation bilaterally, normal work of breathing Heart- Regular rate and rhythm, 2/.6 SEM LUSB (early peaking) GI- soft, NT, ND, + BS Extremities- no clubbing, cyanosis, or edema Minor ecchymosis over his ILR implant site  ILR interrogation is reviewed today  Assessment and Plan:  1. H/o syncope and transient complete heart block on ILR documented 10/18 without associated symptoms Continue toprol 25mg  daily for now.  Will decrease to 12.5mg  or stop this medicine if he has any further symptoms of bradycardia. ILR today reveals no AV block.  He did have 3 pauses which were actually undersensing of PVCs. No changes today We will continue to monitor very closely with his ILR device. He is going to [place] for 3 months.  He is instructed to contact the device clinic to make sure that he is transmitting once he is there.  2. Hypertensive cardiovascular disease Stable No change required today   Continue carelink remote monitoring Follow-up with Dr Ron Parker as scheduled I will see again in 6 months

## 2014-07-30 ENCOUNTER — Encounter (HOSPITAL_COMMUNITY): Payer: Self-pay | Admitting: Cardiovascular Disease

## 2014-08-27 ENCOUNTER — Ambulatory Visit: Payer: Medicare Other | Admitting: *Deleted

## 2014-08-27 ENCOUNTER — Encounter: Payer: Self-pay | Admitting: Internal Medicine

## 2014-08-27 ENCOUNTER — Telehealth: Payer: Self-pay | Admitting: Cardiology

## 2014-08-27 NOTE — Telephone Encounter (Signed)
Pt called back and stated that [employer] needed to speak with me about his home monitor issue. I called [employer] and after [employer] investigated issue they discovered that pt Loop recorder SN was documented wrong in the carelink web site. We are now received transmission from pt home monitor.

## 2014-08-27 NOTE — Telephone Encounter (Signed)
Informed pt that I would call carelink customer tech services and have them update serial number in his profile so we can receive transmission. Pt verbalized understanding. Unfortunately when I spoke w/ [employer] tech services I was unable to resolve the issue for the pt. I called pt back and informed him that he would need to call in himself. Provided pt w/ number. Pt verbalized understanding.

## 2014-09-12 LAB — MDC_IDC_ENUM_SESS_TYPE_REMOTE: Date Time Interrogation Session: 20160109050500

## 2014-09-17 ENCOUNTER — Encounter: Payer: Self-pay | Admitting: Internal Medicine

## 2014-09-18 ENCOUNTER — Encounter: Payer: Self-pay | Admitting: Internal Medicine

## 2014-09-24 ENCOUNTER — Encounter: Payer: Self-pay | Admitting: Internal Medicine

## 2014-09-25 ENCOUNTER — Ambulatory Visit (INDEPENDENT_AMBULATORY_CARE_PROVIDER_SITE_OTHER): Payer: Medicare HMO | Admitting: *Deleted

## 2014-09-25 DIAGNOSIS — R55 Syncope and collapse: Secondary | ICD-10-CM

## 2014-09-28 ENCOUNTER — Encounter: Payer: Self-pay | Admitting: Internal Medicine

## 2014-09-28 LAB — MDC_IDC_ENUM_SESS_TYPE_REMOTE: MDC IDC SESS DTM: 20160208050500

## 2014-09-28 NOTE — Progress Notes (Signed)
Loop recorder 

## 2014-10-01 ENCOUNTER — Encounter: Payer: Self-pay | Admitting: Internal Medicine

## 2014-10-12 ENCOUNTER — Telehealth: Payer: Self-pay | Admitting: *Deleted

## 2014-10-12 NOTE — Telephone Encounter (Signed)
LMOVM for return call---pause episode from 2-19 around 421am and 2-17 around 630 am. Called to see if pt had any symptoms related with episodes.

## 2014-10-12 NOTE — Telephone Encounter (Signed)
No symptoms per pt--kwm

## 2014-10-12 NOTE — Telephone Encounter (Signed)
Follow Up ° °Pt returned call//  °

## 2014-10-13 ENCOUNTER — Encounter: Payer: Self-pay | Admitting: Internal Medicine

## 2014-10-22 ENCOUNTER — Encounter: Payer: Self-pay | Admitting: Internal Medicine

## 2014-10-23 ENCOUNTER — Encounter: Payer: Self-pay | Admitting: Internal Medicine

## 2014-10-25 ENCOUNTER — Encounter: Payer: Self-pay | Admitting: Internal Medicine

## 2014-10-25 LAB — MDC_IDC_ENUM_SESS_TYPE_REMOTE: MDC IDC SESS DTM: 20160306050500

## 2014-10-26 ENCOUNTER — Ambulatory Visit (INDEPENDENT_AMBULATORY_CARE_PROVIDER_SITE_OTHER): Payer: Medicare HMO | Admitting: *Deleted

## 2014-10-26 DIAGNOSIS — R55 Syncope and collapse: Secondary | ICD-10-CM

## 2014-10-29 NOTE — Progress Notes (Signed)
Loop recorder 

## 2014-11-05 ENCOUNTER — Encounter: Payer: Self-pay | Admitting: Internal Medicine

## 2014-11-08 ENCOUNTER — Other Ambulatory Visit: Payer: Self-pay | Admitting: Internal Medicine

## 2014-11-09 ENCOUNTER — Encounter: Payer: Self-pay | Admitting: Internal Medicine

## 2014-11-18 ENCOUNTER — Encounter: Payer: Self-pay | Admitting: Internal Medicine

## 2014-11-23 ENCOUNTER — Encounter: Payer: Self-pay | Admitting: Cardiology

## 2014-11-23 ENCOUNTER — Ambulatory Visit (INDEPENDENT_AMBULATORY_CARE_PROVIDER_SITE_OTHER): Payer: Medicare HMO | Admitting: Cardiology

## 2014-11-23 VITALS — BP 132/60 | HR 85 | Ht 69.0 in | Wt 196.4 lb

## 2014-11-23 DIAGNOSIS — I739 Peripheral vascular disease, unspecified: Secondary | ICD-10-CM

## 2014-11-23 DIAGNOSIS — I4729 Other ventricular tachycardia: Secondary | ICD-10-CM

## 2014-11-23 DIAGNOSIS — R001 Bradycardia, unspecified: Secondary | ICD-10-CM

## 2014-11-23 DIAGNOSIS — I2510 Atherosclerotic heart disease of native coronary artery without angina pectoris: Secondary | ICD-10-CM | POA: Diagnosis not present

## 2014-11-23 DIAGNOSIS — R55 Syncope and collapse: Secondary | ICD-10-CM

## 2014-11-23 DIAGNOSIS — I472 Ventricular tachycardia: Secondary | ICD-10-CM

## 2014-11-23 DIAGNOSIS — I350 Nonrheumatic aortic (valve) stenosis: Secondary | ICD-10-CM | POA: Diagnosis not present

## 2014-11-23 DIAGNOSIS — I779 Disorder of arteries and arterioles, unspecified: Secondary | ICD-10-CM

## 2014-11-23 NOTE — Assessment & Plan Note (Signed)
Coronaries are stable at the time of his last cath. No further workup.

## 2014-11-23 NOTE — Progress Notes (Signed)
Cardiology Office Note   Date:  11/23/2014   ID:  [name], DOB 16-Jan-1931, MRN [id number]  PCP:  [name], MD  Cardiologist:  Dola Argyle, MD   Chief Complaint  Patient presents with  . Appointment    Follow-up coronary disease and bradycardia      History of Present Illness: [name] is a 81 y.o. male who presents today to follow-up artery disease and bradycardia. I have followed this very special patient of mine for many many years. Fortunately he is doing well. His coronary disease has been treated over time and he is stable.  He had a syncopal episode in 2014. At that time his coronaries were stable in the cath lab. His gradient across the aortic valve was only mild at that time. He had a recurrent episode in September, 2015. Echo at that time showed moderate gradients. Gradient ratios raised the question that his aortic stenosis might be worse. At that time I decided not to pursue this further. We will arrange for a six-month follow-up echo to be sure that there has not been rapid change in his aortic valve.   He has bradycardia. He has a loop recorder in place. This is monitored carefully by our team. He does have pauses. However he's not having any significant symptoms related to these pauses. He understands the importance of following this issue this way. He is not having any chest pain.    Past Medical History  Diagnosis Date  . Hyperlipidemia   . Benign prostatic hypertrophy   . Carotid artery disease     DES, SVG to circumflex marginal, October, 2010 ( SVG to PDA and PLA patent , LIMA to LAD patent, EF 60%, mild inferior hypokinesis; H/o mild CHF, CABG  . CAD (coronary artery disease)     DES, SVG to circumflex marginal, October, 2010 ( SVG to PDA and PLA patent , LIMA to LAD patent, EF 60%, mild inferior hypokinesis  . Syncope     Remote, etiology unknown  . Psoriasis     severe; with total skin exfoliation in the past resolved  . Hx of  decompressive lumbar laminectomy     Dr.Botero December, 2011  . Aortic stenosis     Mild, echo, August, 2012  . HTN (hypertension)     takes meds daily  . Hypothyroidism   . Nephrolithiasis   . GERD (gastroesophageal reflux disease)     takes daily  . Arthritis   . Neuromuscular disorder   . PVC's (premature ventricular contractions)     May, 2014  . Ventricular tachycardia, non-sustained     Past Surgical History  Procedure Laterality Date  . Coronary artery bypass graft  1995  . Decompression of left median nerve    . Left carotid endarectomy and dacron patch angioplasty]    . Bilateral l3-4 laminectomies    . Removal of synovial cyst    . Posterior arthrodesis with autograft and allograft    . Colonoscopy  11/23/2011    Procedure: COLONOSCOPY;  Surgeon: Rogene Houston, MD;  [facility];  Service: Endoscopy;  Laterality: N/A;  730  . Back surgery      x3  . Tonsillectomy    . Appendectomy    . Knee arthroplasty  04/16/2012    left total  . Total knee arthroplasty  04/16/2012    Procedure: TOTAL KNEE ARTHROPLASTY;  Surgeon: Garald Balding, MD;  Location: [facility];  Service: Orthopedics;  Laterality:  Left;  . Eye surgery      cataracts; bilateral  . Joint replacement  Sept. 27, 2013    Left Knee  . Spine surgery      X's 3  . Carotid endarterectomy Left 02-24-08    cea  Dr. Amedeo Plenty  . Carelink remote monitoring  Oct. 29, 2015    by Dr. [name]  . Carpal tunnel release Bilateral 2010  . Left heart catheterization with coronary angiogram N/A 12/27/2012    Procedure: LEFT HEART CATHETERIZATION WITH CORONARY ANGIOGRAM;  Surgeon: Burnell Blanks, MD;  Location: [facility] CATH LAB;  Service: Cardiovascular;  Laterality: N/A;    Patient Active Problem List   Diagnosis Date Noted  . Transient complete heart block 2020/10/2313  . Hypertensive cardiovascular disease 2020/10/2313  . Bradycardia 12/23/2013  . Aftercare following surgery of the circulatory system, [place]  06/30/2013  . PVC's (premature ventricular contractions)   . Neuromuscular disorder   . Nephrolithiasis   . Hypothyroidism   . Ventricular tachycardia, non-sustained   . Osteoarthritis of knee 04/19/2012  . Hyponatremia 04/19/2012  . Hypokalemia 04/19/2012  . Postoperative anemia due to acute blood loss 04/19/2012  . Aortic stenosis   . Occlusion and stenosis of carotid artery without mention of cerebral infarction 02/13/2012  . Carotid artery disease   . Hyperlipidemia   . HTN (hypertension)   . CAD (coronary artery disease)   . Syncope   . Psoriasis   . Ejection fraction   . Fluid overload   . Carpal tunnel syndrome   . Hx of CABG   . BENIGN PROSTATIC HYPERTROPHY, HX OF 06/11/2009      Current Outpatient Prescriptions  Medication Sig Dispense Refill  . allopurinol (ZYLOPRIM) 300 MG tablet Take 600 mg by mouth daily.     Marland Kitchen aspirin EC 81 MG tablet Take 81 mg by mouth daily.    Marland Kitchen atorvastatin (LIPITOR) 20 MG tablet Take 20 mg by mouth every evening.     . fluticasone (FLONASE) 50 MCG/ACT nasal spray Place 1 spray into both nostrils 2 (two) times daily.    . furosemide (LASIX) 20 MG tablet Take 10 mg by mouth daily as needed for fluid or edema.    Marland Kitchen latanoprost (XALATAN) 0.005 % ophthalmic solution Place 1 drop into both eyes at bedtime.    Marland Kitchen levothyroxine (SYNTHROID, LEVOTHROID) 112 MCG tablet Take 112 mcg by mouth daily before breakfast.    . metoprolol succinate (TOPROL-XL) 25 MG 24 hr tablet Take 1 tablet (25 mg total) by mouth daily. 30 tablet 6  . multivitamin (THERAGRAN) per tablet Take 1 tablet by mouth daily.     . nitroGLYCERIN (NITROSTAT) 0.4 MG SL tablet Place 0.4 mg under the tongue every 5 (five) minutes x 3 doses as needed. For chest pain    . omeprazole (PRILOSEC) 20 MG capsule Take 20 mg by mouth daily.     Marland Kitchen pyridOXINE (VITAMIN B-6) 100 MG tablet Take 100 mg by mouth daily.     . Tamsulosin HCl (FLOMAX) 0.4 MG CAPS Take 0.4 mg by mouth daily.     . valsartan  (DIOVAN) 320 MG tablet Take 320 mg by mouth daily.      No current facility-administered medications for this visit.    Allergies:   Propylene glycol    Social History:  The patient  reports that he has never smoked. He has never used smokeless tobacco. He reports that he drinks alcohol. He reports that he does not use  illicit drugs.   Family History:  The patient's family history includes Heart attack in his mother and son; Heart disease in his father and mother; Hyperlipidemia in his father; Hypertension in his father. There is no history of Colon cancer.    ROS:  Please see the history of present illness.     Patient denies fever, chills, headache, sweats, rash, change in vision, change in hearing, chest pain, cough, nausea or vomiting, urinary symptoms. All other systems are reviewed and are negative.   PHYSICAL EXAM: VS:  BP 132/60 mmHg  Pulse 85  Ht 5\' 9"  (1.753 m)  Wt 196 lb 6.4 oz (89.086 kg)  BMI 28.99 kg/m2 , He is oriented to person time and place. Affect is normal. Head is atraumatic. Sclera and conjunctiva are normal. There is no jugulovenous distention. Lungs are clear. Respiratory effort is nonlabored. Cardiac exam reveals S1 and S2. The abdomen is soft. There is no peripheral edema. There are no musculoskeletal deformities. There are no skin rashes.  EKG:   EKG is not done today.   Recent Labs: No results found for requested labs within last 365 days.    Lipid Panel    Component Value Date/Time   CHOL 143 08/06/2006 0814   TRIG 141 08/06/2006 0814   HDL 29.7* 08/06/2006 0814   CHOLHDL 4.8 CALC 08/06/2006 0814   VLDL 28 08/06/2006 0814   LDLCALC 85 08/06/2006 0814      Wt Readings from Last 3 Encounters:  11/23/14 196 lb 6.4 oz (89.086 kg)  07/08/14 199 lb (90.266 kg)  07/07/14 199 lb (90.266 kg)      Current medicines are reviewed  The patient understands his medications well.     ASSESSMENT AND PLAN:

## 2014-11-23 NOTE — Assessment & Plan Note (Signed)
He has bradycardia. He has an implantable loop in and he has pauses. He is contacted by our electrophysiology team and he does not have symptoms associated with these pauses. His rhythm will be followed with his implantable loop recorder.

## 2014-11-23 NOTE — Assessment & Plan Note (Signed)
The patient had a brief run of ventricular tachycardia during a nuclear stress test in May, 2014. However there has been no ongoing prove that ventricular arrhythmias are causing any problems.

## 2014-11-23 NOTE — Patient Instructions (Signed)
Your physician recommends that you continue on your current medications as directed. Please refer to the Current Medication list given to you today.  Your physician has requested that you have an echocardiogram. Echocardiography is a painless test that uses sound waves to create images of your heart. It provides your doctor with information about the size and shape of your heart and how well your heart's chambers and valves are working. This procedure takes approximately one hour. There are no restrictions for this procedure.  Your physician wants you to follow-up in: 6 month in our [place] office. You will receive a reminder letter in the mail two months in advance. If you don't receive a letter, please call our office to schedule the follow-up appointment.

## 2014-11-23 NOTE — Assessment & Plan Note (Signed)
Up to this point we have not felt that aortic stenosis was playing a role with his syncopal episode in September, 2015. However the gradients were somewhat increased. I decided to proceed with a 6 month follow-up study to be sure that there has not been any significant change. I will be in touch with him with the information.

## 2014-11-23 NOTE — Assessment & Plan Note (Signed)
There has been no recurrent syncope since September, 2015. This occurred around the time of walking on a treadmill. His implantable loop recorder is being followed. Beta-blocking doses were reduced carefully. He did have limited ventricular tachycardia around the time of a treadmill in the past. We continue to follow his aortic valvular disease.

## 2014-11-23 NOTE — Assessment & Plan Note (Signed)
His carotids are followed by vascular surgery and have been stable.

## 2014-11-25 ENCOUNTER — Ambulatory Visit (INDEPENDENT_AMBULATORY_CARE_PROVIDER_SITE_OTHER): Payer: Medicare HMO | Admitting: *Deleted

## 2014-11-25 DIAGNOSIS — R55 Syncope and collapse: Secondary | ICD-10-CM | POA: Diagnosis not present

## 2014-11-25 LAB — MDC_IDC_ENUM_SESS_TYPE_REMOTE: Date Time Interrogation Session: 20160406040500

## 2014-11-26 ENCOUNTER — Ambulatory Visit (HOSPITAL_COMMUNITY)
Admission: RE | Admit: 2014-11-26 | Discharge: 2014-11-26 | Disposition: A | Discharge status: Home / Self Care | Payer: Medicare HMO | Source: Ambulatory Visit | Attending: Cardiology | Admitting: Cardiology

## 2014-11-26 DIAGNOSIS — I350 Nonrheumatic aortic (valve) stenosis: Secondary | ICD-10-CM | POA: Diagnosis not present

## 2014-11-26 DIAGNOSIS — I081 Rheumatic disorders of both mitral and tricuspid valves: Secondary | ICD-10-CM | POA: Diagnosis not present

## 2014-11-26 NOTE — Progress Notes (Signed)
  Echocardiogram 2D Echocardiogram has been performed.  Samuel Germany 11/26/2014, 10:55 AM

## 2014-11-30 NOTE — Progress Notes (Signed)
Loop recorder 

## 2014-12-08 ENCOUNTER — Encounter: Payer: Self-pay | Admitting: Internal Medicine

## 2014-12-16 ENCOUNTER — Ambulatory Visit (INDEPENDENT_AMBULATORY_CARE_PROVIDER_SITE_OTHER): Payer: Medicare HMO | Admitting: Neurology

## 2014-12-16 ENCOUNTER — Encounter: Payer: Self-pay | Admitting: Neurology

## 2014-12-16 VITALS — BP 151/76 | HR 82 | Ht 69.0 in | Wt 197.0 lb

## 2014-12-16 DIAGNOSIS — G629 Polyneuropathy, unspecified: Secondary | ICD-10-CM

## 2014-12-16 NOTE — Progress Notes (Signed)
PATIENT: [name] DOB: 1931-04-27  HISTORICAL  [name] is 81 years old left-handed male, referred by his primary care physician Dr. Willey Blade for evaluation of peripheral neuropathy  He has complicated medical history, gout, has been on allopurinol treatment 300 mg twice a day since 2006, previous history of severe psoriasis with whole-body skin exfoliation now is in remission, coronary artery disease, status post open heart surgery, syncope, had ICD implant, left carotid endarterectomy, previous cervical decompression surgery for cervical radiculopathy, lumbar decompression surgery for severe low back pain, left knee replacement in August 2013   Prior to his left knee replacement, he noticed bilateral foot, leg numbness, gradually getting worse over the past 3 years, now has dense numbness of bilateral lower extremity from knee down," like walking on two piece of wooden block", sometimes he has difficulty feeling the paddle when driving, he also noticed unbalanced gait, but no significant weakness  Laboratory evaluation December 01 2014, normal vitamin B12 age 39, CBC showed mild anemia hemoglobin 11.8   He denies significant neck pain, low back pain, no bowel and bladder incontinence, he denies bilateral upper extremity paresthesia.    REVIEW OF SYSTEMS: Full 14 system review of systems performed and notable only for swelling in legs, easy bruising, joints pain, skin sensitivity, numbness   ALLERGIES: Allergies  Allergen Reactions  . Propylene Glycol Hives and Rash    HOME MEDICATIONS: Current Outpatient Prescriptions  Medication Sig Dispense Refill  . allopurinol (ZYLOPRIM) 300 MG tablet Take 600 mg by mouth daily.     Marland Kitchen aspirin EC 81 MG tablet Take 81 mg by mouth daily.    Marland Kitchen atorvastatin (LIPITOR) 20 MG tablet Take 20 mg by mouth every evening.     . fluticasone (FLONASE) 50 MCG/ACT nasal spray Place 1 spray into both nostrils 2 (two) times daily.    . furosemide  (LASIX) 20 MG tablet Take 10 mg by mouth daily as needed for fluid or edema.    Marland Kitchen latanoprost (XALATAN) 0.005 % ophthalmic solution Place 1 drop into both eyes at bedtime.    Marland Kitchen levothyroxine (SYNTHROID, LEVOTHROID) 112 MCG tablet Take 112 mcg by mouth daily before breakfast.    . metoprolol succinate (TOPROL-XL) 25 MG 24 hr tablet Take 1 tablet (25 mg total) by mouth daily. 30 tablet 6  . multivitamin (THERAGRAN) per tablet Take 1 tablet by mouth daily.     . nitroGLYCERIN (NITROSTAT) 0.4 MG SL tablet Place 0.4 mg under the tongue every 5 (five) minutes x 3 doses as needed. For chest pain    . omeprazole (PRILOSEC) 20 MG capsule Take 20 mg by mouth daily.     Marland Kitchen pyridOXINE (VITAMIN B-6) 100 MG tablet Take 100 mg by mouth daily.     . Tamsulosin HCl (FLOMAX) 0.4 MG CAPS Take 0.4 mg by mouth daily.     . valsartan (DIOVAN) 320 MG tablet Take 320 mg by mouth daily.      No current facility-administered medications for this visit.    PAST MEDICAL HISTORY: Past Medical History  Diagnosis Date  . Hyperlipidemia   . Benign prostatic hypertrophy   .  coronary artery disease     DES, SVG to circumflex marginal, October, 2010 ( SVG to PDA and PLA patent , LIMA to LAD patent, EF 60%, mild inferior hypokinesis; H/o mild CHF, CABG  . CAD (coronary artery disease)     DES, SVG to circumflex marginal, October, 2010 ( SVG to PDA and  PLA patent , LIMA to LAD patent, EF 60%, mild inferior hypokinesis  . Syncope     Remote, etiology unknown  . Psoriasis     severe; with total skin exfoliation in the past resolved  . Hx of decompressive lumbar laminectomy     Dr.Botero December, 2011  . Aortic stenosis     Mild, echo, August, 2012  . HTN (hypertension)     takes meds daily  . Hypothyroidism   . Nephrolithiasis   . GERD (gastroesophageal reflux disease)     takes daily  . Arthritis   . Neuromuscular disorder   . PVC's (premature ventricular contractions)     May, 2014  . Ventricular tachycardia,  non-sustained     PAST SURGICAL HISTORY: Past Surgical History  Procedure Laterality Date  . Coronary artery bypass graft  1995  . Decompression of left median nerve    . Left carotid endarectomy and dacron patch angioplasty]    . Bilateral l3-4 laminectomies    . Removal of synovial cyst    . Posterior arthrodesis with autograft and allograft    . Colonoscopy  11/23/2011    Procedure: COLONOSCOPY;  Surgeon: Rogene Houston, MD;  [facility];  Service: Endoscopy;  Laterality: N/A;  730  . Back surgery      x3  . Tonsillectomy    . Appendectomy    . Knee arthroplasty  04/16/2012    left total  . Total knee arthroplasty  04/16/2012    Procedure: TOTAL KNEE ARTHROPLASTY;  Surgeon: Garald Balding, MD;  Location: [place] [facility];  Service: Orthopedics;  Laterality: Left;  Marland Kitchen Eye surgery      cataracts; bilateral  . Joint replacement  Sept. 27, 2013    Left Knee  . Spine surgery      X's 3  . Carotid endarterectomy Left 02-24-08    cea  Dr. Amedeo Plenty  . Carelink remote monitoring  Oct. 29, 2015    by Dr. [name]  . Carpal tunnel release Bilateral 2010  . Left heart catheterization with coronary angiogram N/A 12/27/2012    Procedure: LEFT HEART CATHETERIZATION WITH CORONARY ANGIOGRAM;  Surgeon: Burnell Blanks, MD;  Location: [facility] CATH LAB;  Service: Cardiovascular;  Laterality: N/A;    FAMILY HISTORY: Family History  Problem Relation Age of Onset  . Colon cancer Neg Hx   . Heart attack Mother   . Heart disease Mother     Heart Disease before age 16  . Heart disease Father     Heart Disease before age 35  . Hypertension Father   . Hyperlipidemia Father   . Heart attack Son     SOCIAL HISTORY:  History   Social History  . Marital Status: Married    Spouse Name: N/A  . Number of Children: 2  . Years of Education: N/A   Occupational History  . Not on file.   Social History Main Topics  . Smoking status: Never Smoker   . Smokeless tobacco: Never Used  .  Alcohol Use: Yes     Comment: Occasional  . Drug Use: No  . Sexual Activity: Not Currently   Other Topics Concern  . Not on file   Social History Narrative     PHYSICAL EXAM   Filed Vitals:   12/16/14 0832  BP: 151/76  Pulse: 82  Height: 5\' 9"  (1.753 m)  Weight: 197 lb (89.359 kg)    Not recorded      Body  mass index is 29.08 kg/(m^2).  PHYSICAL EXAMNIATION:  Gen: NAD, conversant, well nourised, obese, well groomed                     Cardiovascular: Regular rate rhythm, no peripheral edema, warm, nontender. Eyes: Conjunctivae clear without exudates or hemorrhage Neck: Supple, no carotid bruise. Pulmonary: Clear to auscultation bilaterally   NEUROLOGICAL EXAM:  MENTAL STATUS: Speech:    Speech is normal; fluent and spontaneous with normal comprehension.  Cognition:    The patient is oriented to person, place, and time;     recent and remote memory intact;     language fluent;     normal attention, concentration,     fund of knowledge.  CRANIAL NERVES: CN II: Visual fields are full to confrontation. Fundoscopic exam is normal with sharp discs and no vascular changes. Venous pulsations are present bilaterally. Pupils are 4 mm and briskly reactive to light. Visual acuity is 20/20 bilaterally. CN III, IV, VI: extraocular movement are normal. No ptosis. CN V: Facial sensation is intact to pinprick in all 3 divisions bilaterally. Corneal responses are intact.  CN VII: Face is symmetric with normal eye closure and smile. CN VIII: Hearing is normal to rubbing fingers CN IX, X: Palate elevates symmetrically. Phonation is normal. CN XI: Head turning and shoulder shrug are intact CN XII: Tongue is midline with normal movements and no atrophy.  MOTOR: There is no pronator drift of out-stretched arms. he has mild bilateral intrinsic hand muscle atrophy   bilateral upper extremity proximal muscle strength is normal, he has mild bilateral finger abduction weakness,  bilateral lower extremity proximal muscle strength is normal, slight bilateral toe extension, flexion weakness   REFLEXES: Reflexes are  1 and symmetric at the biceps, triceps,  absent at bilateral knees, and ankles. Plantar responses are flexor.  SENSORY:  length dependent decreased light touch, pinprick to knee level, absent  position senseat toes , and vibration sense are intact in fingers and decreased at  toes.  COORDINATION: Rapid alternating movements and fine finger movements are intact. There is no dysmetria on finger-to-nose and heel-knee-shin. There are no abnormal or extraneous movements.   GAIT/STANCE: He needs push up to get up from seated position, wide-based, cautious, mildly unsteady, has difficulty standing on heels, was able to stand on tiptoe Romberg is present   DIAGNOSTIC DATA (LABS, IMAGING, TESTING) - I reviewed patient records, labs, notes, testing and imaging myself where available.  Lab Results  Component Value Date   WBC 7.5 12/24/2012   HGB 12.7* 12/24/2012   HCT 39.6 12/24/2012   MCV 82.3 12/24/2012   PLT 205 12/24/2012      Component Value Date/Time   NA 138 12/24/2012 1618   K 4.0 12/24/2012 1618   CL 101 12/24/2012 1618   CO2 29 12/24/2012 1618   GLUCOSE 124* 12/24/2012 1618   BUN 15 12/24/2012 1618   CREATININE 1.01 12/24/2012 1618   CREATININE 1.07 12/13/2012 0525   CALCIUM 9.4 12/24/2012 1618   PROT 6.4 12/13/2012 0525   ALBUMIN 3.5 12/13/2012 0525   AST 25 12/13/2012 0525   ALT 27 12/13/2012 0525   ALKPHOS 89 12/13/2012 0525   BILITOT 0.8 12/13/2012 0525   GFRNONAA 64* 12/13/2012 0525   GFRAA 74* 12/13/2012 0525   Lab Results  Component Value Date   CHOL 143 08/06/2006   HDL 29.7* 08/06/2006   LDLCALC 85 08/06/2006   TRIG 141 08/06/2006   CHOLHDL 4.8 CALC 08/06/2006  No results found for: HGBA1C No results found for: VITAMINB12 Lab Results  Component Value Date   TSH 1.958 12/11/2012      ASSESSMENT AND  PLAN  ABDISHAKUR GOTTSCHALL is a 81 y.o. male  With past medical history of hypertension, hyperlipidemia, gout, chronic allopurinol treatment, previous cervical, lumbar decompression surgery, left knee replacement,  presenting with more than 3 years history of gradual onset slow worsening ascending numbness, on examination, he has  length dependent sensory changes, absent proprioception at bilateral toes, absent bilateral lower extremity deep tendon reflexes,   1. Differentiation diagnosis including peripheral neuropathy, demyelinating, need to rule out lumbar radiculopathy 2, EMG nerve conduction study 3. Laboratory evaluations      Marcial Pacas, M.D. Ph.D.  [facility] [address] [place], [place] [address] Ph: [phone] Fax: [phone]

## 2014-12-18 LAB — CK: Total CK: 96 U/L (ref 24–204)

## 2014-12-18 LAB — COMPREHENSIVE METABOLIC PANEL
ALBUMIN: 4.2 g/dL (ref 3.5–4.7)
ALT: 27 IU/L (ref 0–44)
AST: 27 IU/L (ref 0–40)
Albumin/Globulin Ratio: 1.6 (ref 1.1–2.5)
Alkaline Phosphatase: 112 IU/L (ref 39–117)
BUN / CREAT RATIO: 18 (ref 10–22)
BUN: 16 mg/dL (ref 8–27)
Bilirubin Total: 0.4 mg/dL (ref 0.0–1.2)
CO2: 26 mmol/L (ref 18–29)
Calcium: 9.3 mg/dL (ref 8.6–10.2)
Chloride: 99 mmol/L (ref 97–108)
Creatinine, Ser: 0.91 mg/dL (ref 0.76–1.27)
GFR calc Af Amer: 91 mL/min/{1.73_m2} (ref >59)
GFR calc non Af Amer: 79 mL/min/{1.73_m2} (ref >59)
Globulin, Total: 2.6 g/dL (ref 1.5–4.5)
Glucose: 121 mg/dL — ABNORMAL HIGH (ref 65–99)
Potassium: 3.9 mmol/L (ref 3.5–5.2)
Sodium: 141 mmol/L (ref 134–144)
TOTAL PROTEIN: 6.8 g/dL (ref 6.0–8.5)

## 2014-12-18 LAB — PROTEIN ELECTROPHORESIS
A/G Ratio: 1.2 (ref 0.7–2.0)
Albumin ELP: 3.7 g/dL (ref 3.2–5.6)
Alpha 1: 0.2 g/dL (ref 0.1–0.4)
Alpha 2: 0.8 g/dL (ref 0.4–1.2)
Beta: 0.9 g/dL (ref 0.6–1.3)
GLOBULIN, TOTAL: 3.1 g/dL (ref 2.0–4.5)
Gamma Globulin: 1.2 g/dL (ref 0.5–1.6)

## 2014-12-18 LAB — THYROID PANEL WITH TSH
FREE THYROXINE INDEX: 2.3 (ref 1.2–4.9)
T3 UPTAKE RATIO: 37 % (ref 24–39)
T4 TOTAL: 6.1 ug/dL (ref 4.5–12.0)
TSH: 0.901 u[IU]/mL (ref 0.450–4.500)

## 2014-12-18 LAB — RPR: RPR: NONREACTIVE

## 2014-12-18 LAB — FOLATE

## 2014-12-18 LAB — B. BURGDORFI ANTIBODIES: Lyme IgG/IgM Ab: <0.91 {ISR} (ref 0.00–0.90)

## 2014-12-18 LAB — ANA W/REFLEX IF POSITIVE: Anti Nuclear Antibody(ANA): NEGATIVE

## 2014-12-25 ENCOUNTER — Ambulatory Visit (INDEPENDENT_AMBULATORY_CARE_PROVIDER_SITE_OTHER): Payer: Medicare HMO | Admitting: *Deleted

## 2014-12-25 ENCOUNTER — Encounter: Payer: Self-pay | Admitting: Internal Medicine

## 2014-12-25 DIAGNOSIS — R55 Syncope and collapse: Secondary | ICD-10-CM

## 2014-12-29 ENCOUNTER — Encounter: Payer: Self-pay | Admitting: Internal Medicine

## 2014-12-29 ENCOUNTER — Ambulatory Visit (INDEPENDENT_AMBULATORY_CARE_PROVIDER_SITE_OTHER): Payer: Medicare HMO | Admitting: Neurology

## 2014-12-29 ENCOUNTER — Ambulatory Visit (INDEPENDENT_AMBULATORY_CARE_PROVIDER_SITE_OTHER): Payer: Self-pay | Admitting: Neurology

## 2014-12-29 DIAGNOSIS — Z0289 Encounter for other administrative examinations: Secondary | ICD-10-CM

## 2014-12-29 DIAGNOSIS — M5416 Radiculopathy, lumbar region: Secondary | ICD-10-CM

## 2014-12-29 DIAGNOSIS — G629 Polyneuropathy, unspecified: Secondary | ICD-10-CM

## 2014-12-29 DIAGNOSIS — R7309 Other abnormal glucose: Secondary | ICD-10-CM

## 2014-12-29 NOTE — Procedures (Addendum)
   NCS (NERVE CONDUCTION STUDY) WITH EMG (ELECTROMYOGRAPHY) REPORT   STUDY DATE: Dec 29 2014 PATIENT NAME: [name] DOB: 02-04-1931 MRN: [id number]    TECHNOLOGIST: Laretta Alstrom ELECTROMYOGRAPHER: Marcial Pacas M.D.  CLINICAL INFORMATION:  81 year old male, with history of lumbar decompression, presenting with ascending bilateral lower extremity numbness to knee level  FINDINGS: NERVE CONDUCTION STUDY: Right peroneal sensory response was absent. Right peroneal motor response was absent. Left peroneal, bilateral tibial motor responses showed severely decreased C map amplitude. Right ulnar sensory and motor responses were normal. Right median sensory response showed moderately prolonged peak latency with preserved snap amplitude. Right median motor responses showed mild to moderately prolonged distal latency, with normal conduction velocity.   NEEDLE ELECTROMYOGRAPHY: Selected needle examination was performed at bilateral lower extremity muscles, and bilateral lumbar sacral paraspinal muscles.  Bilateral tibialis anterior: Increased insertional activity, no spontaneous activity, mildly enlarged motor in unit potential, with mildly decreased recruitment patterns.  Needle examination of bilateral tibialis anterior, peroneal longus, vastus lateralis, biceps femoris long head was normal.  There was no spontaneous activity at bilateral lumbar sacral paraspinal muscles, bilateral L4-5 S1.  IMPRESSION:   This is an abnormal study. There is electrodiagnostic evidence of mild chronic bilateral lumbosacral radiculopathies, mainly involving bilateral L4, in addition, there was also evidence of length dependent mild axonal peripheral neuropathy. There was moderate right carpal tunnel syndromes.   INTERPRETING PHYSICIAN:   Marcial Pacas M.D. Ph.D. [facility] [address], [place], [place] [address] [phone]

## 2014-12-30 ENCOUNTER — Telehealth: Payer: Self-pay | Admitting: Neurology

## 2014-12-30 LAB — HEMOGLOBIN A1C
Est. average glucose Bld gHb Est-mCnc: 128 mg/dL
Hgb A1c MFr Bld: 6.1 % — ABNORMAL HIGH (ref 4.8–5.6)

## 2014-12-30 NOTE — Telephone Encounter (Signed)
Results have already been faxed to PCP - called patient to let him know.

## 2014-12-30 NOTE — Telephone Encounter (Signed)
Patient called wanting to speak with [name] regarding his blood work results. Patient would like to know if the results can be sent to his PCP, DR. [name] in [place], [place]. Please call and advise. Patient can be reached @ [phone]

## 2015-01-01 ENCOUNTER — Encounter: Payer: Self-pay | Admitting: Internal Medicine

## 2015-01-01 NOTE — Progress Notes (Signed)
Loop recorder 

## 2015-01-01 NOTE — Progress Notes (Signed)
Electrodiagnostic study today, please see separate procedure report

## 2015-01-04 ENCOUNTER — Encounter: Payer: Self-pay | Admitting: Internal Medicine

## 2015-01-04 ENCOUNTER — Ambulatory Visit (INDEPENDENT_AMBULATORY_CARE_PROVIDER_SITE_OTHER): Payer: Medicare HMO | Admitting: Internal Medicine

## 2015-01-04 VITALS — BP 130/62 | HR 58 | Ht 70.0 in | Wt 194.4 lb

## 2015-01-04 DIAGNOSIS — I119 Hypertensive heart disease without heart failure: Secondary | ICD-10-CM

## 2015-01-04 DIAGNOSIS — R55 Syncope and collapse: Secondary | ICD-10-CM | POA: Diagnosis not present

## 2015-01-04 LAB — CUP PACEART INCLINIC DEVICE CHECK
MDC IDC SESS DTM: 20160516160422
MDC IDC SET ZONE DETECTION INTERVAL: 2000 ms
Zone Setting Detection Interval: 3000 ms
Zone Setting Detection Interval: 400 ms

## 2015-01-04 NOTE — Addendum Note (Signed)
Addended by: Janan Halter F on: 01/04/2015 04:17 PM   Modules accepted: Orders, Medications

## 2015-01-04 NOTE — Progress Notes (Signed)
PCP: Daniel Noble, MD Primary Cardiologist:  Daniel [name] is a 81 y.o. male who presents today for electrophysiology followup.  Since last being seen in our clinic, the patient reports doing very well.  He has had no symptoms of presyncope or syncope.  His energy is "better" with reduced toprol.   He continues to travel to [place] and enjoys fishing.  Today, he denies symptoms of palpitations, chest pain, shortness of breath,  lower extremity edema, dizziness, presyncope, or syncope.  The patient is otherwise without complaint today.   Past Medical History  Diagnosis Date  . Hyperlipidemia   . Benign prostatic hypertrophy   . Carotid artery disease     DES, SVG to circumflex marginal, October, 2010 ( SVG to PDA and PLA patent , LIMA to LAD patent, EF 60%, mild inferior hypokinesis; H/o mild CHF, CABG  . CAD (coronary artery disease)     DES, SVG to circumflex marginal, October, 2010 ( SVG to PDA and PLA patent , LIMA to LAD patent, EF 60%, mild inferior hypokinesis  . Syncope     Remote, etiology unknown  . Psoriasis     severe; with total skin exfoliation in the past resolved  . Hx of decompressive lumbar laminectomy     Daniel.Botero December, 2011  . Aortic stenosis     Mild, echo, August, 2012  . HTN (hypertension)     takes meds daily  . Hypothyroidism   . Nephrolithiasis   . GERD (gastroesophageal reflux disease)     takes daily  . Arthritis   . Neuromuscular disorder   . PVC's (premature ventricular contractions)     May, 2014  . Ventricular tachycardia, non-sustained    Past Surgical History  Procedure Laterality Date  . Coronary artery bypass graft  1995  . Decompression of left median nerve    . Left carotid endarectomy and dacron patch angioplasty]    . Bilateral l3-4 laminectomies    . Removal of synovial cyst    . Posterior arthrodesis with autograft and allograft    . Colonoscopy  11/23/2011    Procedure: COLONOSCOPY;  Surgeon: Daniel Houston, MD;   [facility];  Service: Endoscopy;  Laterality: N/A;  730  . Back surgery      x3  . Tonsillectomy    . Appendectomy    . Knee arthroplasty  04/16/2012    left total  . Total knee arthroplasty  04/16/2012    Procedure: TOTAL KNEE ARTHROPLASTY;  Surgeon: Daniel Balding, MD;  Location: [place];  Service: Orthopedics;  Laterality: Left;  Marland Kitchen Eye surgery      cataracts; bilateral  . Joint replacement  Sept. 27, 2013    Left Knee  . Spine surgery      X's 3  . Carotid endarterectomy Left 02-24-08    cea  Daniel. Amedeo [name]  . Carelink remote monitoring  Oct. 29, 2015    by Daniel. Thompson [name]  . Carpal tunnel release Bilateral 2010  . Left heart catheterization with coronary angiogram N/A 12/27/2012    Procedure: LEFT HEART CATHETERIZATION WITH CORONARY ANGIOGRAM;  Surgeon: Daniel Blanks, MD;  Location: [facility] CATH LAB;  Service: Cardiovascular;  Laterality: N/A;    ROS- all systems are reviewed and negatives except as per HPI above  Current Outpatient Prescriptions  Medication Sig Dispense Refill  . allopurinol (ZYLOPRIM) 300 MG tablet Take 600 mg by mouth daily.     Marland Kitchen aspirin EC 81 MG  tablet Take 81 mg by mouth daily.    Marland Kitchen atorvastatin (LIPITOR) 20 MG tablet Take 20 mg by mouth every evening.     . fluticasone (FLONASE) 50 MCG/ACT nasal spray Place 1 spray into both nostrils 2 (two) times daily.    . furosemide (LASIX) 20 MG tablet Take 10 mg by mouth daily as needed for fluid or edema.    Marland Kitchen latanoprost (XALATAN) 0.005 % ophthalmic solution Place 1 drop into both eyes at bedtime.    Marland Kitchen levothyroxine (SYNTHROID, LEVOTHROID) 112 MCG tablet Take 112 mcg by mouth daily before breakfast.    . metoprolol succinate (TOPROL-XL) 25 MG 24 hr tablet Take 1 tablet (25 mg total) by mouth daily. 30 tablet 6  . multivitamin (THERAGRAN) per tablet Take 1 tablet by mouth daily.     . nitroGLYCERIN (NITROSTAT) 0.4 MG SL tablet Place 0.4 mg under the tongue every 5 (five) minutes x 3 doses as needed. For  chest pain    . omeprazole (PRILOSEC) 20 MG capsule Take 20 mg by mouth daily.     Marland Kitchen pyridOXINE (VITAMIN B-6) 100 MG tablet Take 100 mg by mouth daily.     . Tamsulosin HCl (FLOMAX) 0.4 MG CAPS Take 0.4 mg by mouth daily.     . valsartan (DIOVAN) 320 MG tablet Take 320 mg by mouth daily.      No current facility-administered medications for this visit.    Physical Exam: Filed Vitals:   01/04/15 1527  BP: 130/62  Pulse: 58  Height: 5\' 10"  (1.778 m)  Weight: 194 lb 6.4 oz (88.179 kg)    GEN- The patient is well appearing, alert and oriented x 3 today.   Head- normocephalic, atraumatic Eyes-  Sclera clear, conjunctiva pink Ears- hearing intact Oropharynx- clear Lungs- Clear to ausculation bilaterally, normal work of breathing Heart- Regular rate and rhythm, 2/.6 SEM LUSB (mid peaking) GI- soft, NT, ND, + BS Extremities- no clubbing, cyanosis, or edema Minor ecchymosis over his ILR implant site  ILR interrogation is reviewed today  Assessment and Plan:  1. H/o syncope and transient second degree AV block (noctural, without symptoms) No syncope since I saw him last As he felt better with reduced toprol, I will stop toprol at this time No indication for pacing presently. We will continue to monitor very closely with his ILR device.  2. Hypertensive cardiovascular disease Stable No change required today  Continue carelink remote monitoring Follow-up with [name] as scheduled I will see again in 12 months

## 2015-01-04 NOTE — Patient Instructions (Addendum)
Medication Instructions:  Your physician has recommended you make the following change in your medication:  1) Stop Metoprolol    Labwork: None ordered  Testing/Procedures: None ordered  Follow-Up: Your physician wants you to follow-up in:12 months with  Dr [name] Dennis Bast will receive a reminder letter in the mail two months in advance. If you don't receive a letter, please call our office to schedule the follow-up appointment.   Any Other Special Instructions Will Be Listed Below (If Applicable).

## 2015-01-07 ENCOUNTER — Ambulatory Visit
Admission: RE | Admit: 2015-01-07 | Discharge: 2015-01-07 | Disposition: A | Discharge status: Home / Self Care | Payer: Medicare HMO | Source: Ambulatory Visit | Attending: Neurology | Admitting: Neurology

## 2015-01-07 DIAGNOSIS — M5416 Radiculopathy, lumbar region: Secondary | ICD-10-CM

## 2015-01-07 DIAGNOSIS — R7309 Other abnormal glucose: Secondary | ICD-10-CM

## 2015-01-11 ENCOUNTER — Telehealth: Payer: Self-pay | Admitting: Neurology

## 2015-01-11 NOTE — Telephone Encounter (Signed)
Patient returned my call.  I called him back but there was no answer - left another message.

## 2015-01-11 NOTE — Telephone Encounter (Signed)
Returned your call concerning test results.  Would like for you to call him back at [phone].

## 2015-01-11 NOTE — Telephone Encounter (Signed)
Left message to return my call.  

## 2015-01-11 NOTE — Telephone Encounter (Signed)
Sharyn Lull: please call patient, MRI lumbar showed multilevel degenerative diseaes, most severe at L2-3 level with canal stenosis, which might explains his symptoms, if he has no acute worsening, keep his follow up visit in June 1st, I will go over films with him.    This is an abnormal MRI of the lumbar spine showing the following: 1. At L2-L3 there has been a prior right hemilaminectomy. There is transverse spinal stenosis due to severe facet hypertrophy, retrolisthesis, endplate spurring and disc protrusion. Moderately severe foraminal narrowing could cause compression of either of the exiting L2 nerve roots. Additionally, there could be compression of the traversing right L3 nerve root at the right lateral recess. 2. At L3-L4, there has been prior decompression surgery. There is severe facet hypertrophy, left greater than right, disc bulging and left endplate spurring. There is moderately severe foraminal narrowing that could lead to compression of either of the L3 nerve roots. Additionally, there is left greater than right lateral recess stenosis that could lead to compression of the traversing left L4 nerve root. 3. At L4-L5, there has been prior decompression surgery and the posterior elements might be fused. There is moderately severe foraminal narrowing to either side that could lead to compression of either of the L4 nerve roots. 4. Moderate degenerative changes are noted at T12-L1, L1-L2 and L5-S1 with little potential for nerve root compression at those levels.

## 2015-01-11 NOTE — Telephone Encounter (Signed)
Duplicate task - see other enc for further information.

## 2015-01-12 NOTE — Telephone Encounter (Signed)
Patient is calling back in regard to results of test.  He stated if he is not at home you may leave info with his wife.  Thanks!

## 2015-01-12 NOTE — Telephone Encounter (Signed)
Pt aware of results - moved his appt to an earlier date.

## 2015-01-14 ENCOUNTER — Ambulatory Visit (INDEPENDENT_AMBULATORY_CARE_PROVIDER_SITE_OTHER): Payer: Medicare HMO | Admitting: Neurology

## 2015-01-14 ENCOUNTER — Encounter: Payer: Self-pay | Admitting: Neurology

## 2015-01-14 VITALS — BP 150/82 | HR 76 | Ht 70.0 in | Wt 194.0 lb

## 2015-01-14 DIAGNOSIS — M5416 Radiculopathy, lumbar region: Secondary | ICD-10-CM

## 2015-01-14 DIAGNOSIS — R7309 Other abnormal glucose: Secondary | ICD-10-CM | POA: Diagnosis not present

## 2015-01-14 DIAGNOSIS — G629 Polyneuropathy, unspecified: Secondary | ICD-10-CM

## 2015-01-14 LAB — CUP PACEART REMOTE DEVICE CHECK: MDC IDC SESS DTM: 20160504040500

## 2015-01-14 NOTE — Progress Notes (Addendum)
PATIENT: [name] DOB: 10/26/1930  HISTORICAL  [name] is 81 years old left-handed male, referred by his primary care physician Dr. Willey Blade for evaluation of peripheral neuropathy  He has complicated medical history, gout, has been on allopurinol treatment 300 mg twice a day since 2006, previous history of severe psoriasis with whole-body skin exfoliation now is in remission, coronary artery disease, status post open heart surgery, syncope, had ICD implant, left carotid endarterectomy, previous cervical decompression surgery for cervical radiculopathy, lumbar decompression surgery for severe low back pain, left knee replacement in August 2013   Prior to his left knee replacement, he noticed bilateral foot, leg numbness, gradually getting worse over the past 3 years, now has dense numbness of bilateral lower extremity from knee down," like walking on two piece of wooden block", sometimes he has difficulty feeling the paddle when driving, he also noticed unbalanced gait, but no significant weakness  Laboratory evaluation December 01 2014, normal vitamin B12 age 25, CBC showed mild anemia hemoglobin 11.8   He denies significant neck pain, low back pain, no bowel and bladder incontinence, he denies bilateral upper extremity paresthesia.   UPDATE May 26th 2016: EMG nerve conduction study in May 2016, showed mild peripheral neuropathy, mild chronic bilateral lumbosacral radiculopathy. Laboratory evaluation showed elevated A1c 6.1, elevated glucose 121, otherwise normal or negative CMP CBC, CP K, TSH, B12, RPR, Lyme titer, protein electrophoresis  We have reviewed MRI of lumbar,  L2-L3 there has been a prior right hemilaminectomy. There is transverse spinal stenosis due to severe facet hypertrophy, retrolisthesis, endplate spurring and disc protrusion. Moderately severe foraminal narrowing could cause compression of either of the exiting L2 nerve roots. 2. At L3-L4, there has been prior  decompression surgery. There is severe facet hypertrophy, left greater than right, disc bulging and left endplate spurring. There is moderately severe foraminal narrowing that could lead to compression of either of the L3 nerve roots.  At L4-L5, there has been prior decompression surgery and the posterior elements might be fused. There is moderately severe foraminal narrowing to either side that could lead to compression of either of the L4 nerve roots. Moderate degenerative changes are noted at T12-L1, L1-L2 and L5-S1 with little potential for nerve root compression at those levels.  He has lumbar decompression surgery twice, most recent was 07/2010 by Dr. Joya Salm, he has no significant low back pain, no bowel and bladder incontinence, mild unsteady gait, he does not want to consider lumbar decompression surgery at this point   REVIEW OF SYSTEMS: Full 14 system review of systems performed and notable only for swelling in legs, easy bruising, joints pain, skin sensitivity, numbness   ALLERGIES: Allergies  Allergen Reactions  . Propylene Glycol Hives and Rash    HOME MEDICATIONS: Current Outpatient Prescriptions  Medication Sig Dispense Refill  . allopurinol (ZYLOPRIM) 300 MG tablet Take 600 mg by mouth daily.     Marland Kitchen aspirin EC 81 MG tablet Take 81 mg by mouth daily.    Marland Kitchen atorvastatin (LIPITOR) 20 MG tablet Take 20 mg by mouth every evening.     . fluticasone (FLONASE) 50 MCG/ACT nasal spray Place 1 spray into both nostrils 2 (two) times daily.    . furosemide (LASIX) 20 MG tablet Take 10 mg by mouth daily as needed for fluid or edema.    Marland Kitchen latanoprost (XALATAN) 0.005 % ophthalmic solution Place 1 drop into both eyes at bedtime.    Marland Kitchen levothyroxine (SYNTHROID, LEVOTHROID) 112 MCG tablet  Take 112 mcg by mouth daily before breakfast.    . multivitamin (THERAGRAN) per tablet Take 1 tablet by mouth daily.     . nitroGLYCERIN (NITROSTAT) 0.4 MG SL tablet Place 0.4 mg under the tongue every 5 (five)  minutes x 3 doses as needed. For chest pain    . omeprazole (PRILOSEC) 20 MG capsule Take 20 mg by mouth daily.     Marland Kitchen pyridOXINE (VITAMIN B-6) 100 MG tablet Take 100 mg by mouth daily.     . Tamsulosin HCl (FLOMAX) 0.4 MG CAPS Take 0.4 mg by mouth daily.     . valsartan (DIOVAN) 320 MG tablet Take 320 mg by mouth daily.      No current facility-administered medications for this visit.    PAST MEDICAL HISTORY: Past Medical History  Diagnosis Date  . Hyperlipidemia   . Benign prostatic hypertrophy   .  coronary artery disease     DES, SVG to circumflex marginal, October, 2010 ( SVG to PDA and PLA patent , LIMA to LAD patent, EF 60%, mild inferior hypokinesis; H/o mild CHF, CABG  . CAD (coronary artery disease)     DES, SVG to circumflex marginal, October, 2010 ( SVG to PDA and PLA patent , LIMA to LAD patent, EF 60%, mild inferior hypokinesis  . Syncope     Remote, etiology unknown  . Psoriasis     severe; with total skin exfoliation in the past resolved  . Hx of decompressive lumbar laminectomy     Dr.Botero December, 2011  . Aortic stenosis     Mild, echo, August, 2012  . HTN (hypertension)     takes meds daily  . Hypothyroidism   . Nephrolithiasis   . GERD (gastroesophageal reflux disease)     takes daily  . Arthritis   . Neuromuscular disorder   . PVC's (premature ventricular contractions)     May, 2014  . Ventricular tachycardia, non-sustained     PAST SURGICAL HISTORY: Past Surgical History  Procedure Laterality Date  . Coronary artery bypass graft  1995  . Decompression of left median nerve    . Left carotid endarectomy and dacron patch angioplasty]    . Bilateral l3-4 laminectomies    . Removal of synovial cyst    . Posterior arthrodesis with autograft and allograft    . Colonoscopy  11/23/2011    Procedure: COLONOSCOPY;  Surgeon: Rogene Houston, MD;  [facility];  Service: Endoscopy;  Laterality: N/A;  730  . Back surgery      x3  . Tonsillectomy     . Appendectomy    . Knee arthroplasty  04/16/2012    left total  . Total knee arthroplasty  04/16/2012    Procedure: TOTAL KNEE ARTHROPLASTY;  Surgeon: Garald Balding, MD;  Location: [place];  Service: Orthopedics;  Laterality: Left;  Marland Kitchen Eye surgery      cataracts; bilateral  . Joint replacement  Sept. 27, 2013    Left Knee  . Spine surgery      X's 3  . Carotid endarterectomy Left 02-24-08    cea  Dr. Amedeo Plenty  . Carelink remote monitoring  Oct. 29, 2015    by Dr. [name]  . Carpal tunnel release Bilateral 2010  . Left heart catheterization with coronary angiogram N/A 12/27/2012    Procedure: LEFT HEART CATHETERIZATION WITH CORONARY ANGIOGRAM;  Surgeon: Burnell Blanks, MD;  Location: [facility] CATH LAB;  Service: Cardiovascular;  Laterality: N/A;  FAMILY HISTORY: Family History  Problem Relation Age of Onset  . Colon cancer Neg Hx   . Heart attack Mother   . Heart disease Mother     Heart Disease before age 17  . Heart disease Father     Heart Disease before age 42  . Hypertension Father   . Hyperlipidemia Father   . Heart attack Son 65    SOCIAL HISTORY:  History   Social History  . Marital Status: Married    Spouse Name: N/A  . Number of Children: 2  . Years of Education: N/A   Occupational History  . Not on file.   Social History Main Topics  . Smoking status: Never Smoker   . Smokeless tobacco: Never Used  . Alcohol Use: Yes     Comment: Occasional  . Drug Use: No  . Sexual Activity: Not Currently   Other Topics Concern  . Not on file   Social History Narrative     PHYSICAL EXAM   Filed Vitals:   01/14/15 1122  BP: 150/82  Pulse: 76  Height: 5\' 10"  (1.778 m)  Weight: 194 lb (87.998 kg)    Not recorded      Body mass index is 27.84 kg/(m^2).  PHYSICAL EXAMNIATION:  Gen: NAD, conversant, well nourised, obese, well groomed                     Cardiovascular: Regular rate rhythm, no peripheral edema, warm, nontender. Eyes:  Conjunctivae clear without exudates or hemorrhage Neck: Supple, no carotid bruise. Pulmonary: Clear to auscultation bilaterally   NEUROLOGICAL EXAM:  MENTAL STATUS: Speech:    Speech is normal; fluent and spontaneous with normal comprehension.  Cognition:    The patient is oriented to person, place, and time;     recent and remote memory intact;     language fluent;     normal attention, concentration,     fund of knowledge.  CRANIAL NERVES: CN II: Visual fields are full to confrontation. Fundoscopic exam is normal with sharp discs and no vascular changes. Venous pulsations are present bilaterally. Pupils are 4 mm and briskly reactive to light. Visual acuity is 20/20 bilaterally. CN III, IV, VI: extraocular movement are normal. No ptosis. CN V: Facial sensation is intact to pinprick in all 3 divisions bilaterally. Corneal responses are intact.  CN VII: Face is symmetric with normal eye closure and smile. CN VIII: Hearing is normal to rubbing fingers CN IX, X: Palate elevates symmetrically. Phonation is normal. CN XI: Head turning and shoulder shrug are intact CN XII: Tongue is midline with normal movements and no atrophy.  MOTOR: There is no pronator drift of out-stretched arms. he has mild bilateral intrinsic hand muscle atrophy  Billateral upper extremity proximal muscle strength is normal, he has mild bilateral finger abduction weakness, bilateral lower extremity proximal muscle strength is normal, mild bilateral toe extension, flexion weakness   REFLEXES: Reflexes are  1 and symmetric at the biceps, triceps,  absent at bilateral knees, and ankles. Plantar responses are flexor.  SENSORY:  length dependent decreased light touch, pinprick to knee level, absent  position senseat toes , and vibration sense are intact in fingers and decreased at  toes.  COORDINATION: Rapid alternating movements and fine finger movements are intact. There is no dysmetria on finger-to-nose and  heel-knee-shin. There are no abnormal or extraneous movements.   GAIT/STANCE: He needs push up to get up from seated position, wide-based, cautious, mildly unsteady, has  difficulty standing on heels, was able to stand on tiptoe Romberg is present   DIAGNOSTIC DATA (LABS, IMAGING, TESTING) - I reviewed patient records, labs, notes, testing and imaging myself where available.  Lab Results  Component Value Date   WBC 7.5 12/24/2012   HGB 12.7* 12/24/2012   HCT 39.6 12/24/2012   MCV 82.3 12/24/2012   PLT 205 12/24/2012      Component Value Date/Time   NA 141 12/16/2014 0912   NA 138 12/24/2012 1618   K 3.9 12/16/2014 0912   CL 99 12/16/2014 0912   CO2 26 12/16/2014 0912   GLUCOSE 121* 12/16/2014 0912   GLUCOSE 124* 12/24/2012 1618   BUN 16 12/16/2014 0912   BUN 15 12/24/2012 1618   CREATININE 0.91 12/16/2014 0912   CREATININE 1.01 12/24/2012 1618   CALCIUM 9.3 12/16/2014 0912   PROT 6.8 12/16/2014 0912   PROT 6.4 12/13/2012 0525   ALBUMIN 3.5 12/13/2012 0525   AST 27 12/16/2014 0912   ALT 27 12/16/2014 0912   ALKPHOS 112 12/16/2014 0912   BILITOT 0.4 12/16/2014 0912   BILITOT 0.8 12/13/2012 0525   GFRNONAA 79 12/16/2014 0912   GFRAA 91 12/16/2014 0912   Lab Results  Component Value Date   CHOL 143 08/06/2006   HDL 29.7* 08/06/2006   LDLCALC 85 08/06/2006   TRIG 141 08/06/2006   CHOLHDL 4.8 CALC 08/06/2006   Lab Results  Component Value Date   HGBA1C 6.1* 12/29/2014   No results found for: [id number] Lab Results  Component Value Date   TSH 0.901 12/16/2014      ASSESSMENT AND PLAN  [name] is a 81 y.o. male  With past medical history of hypertension, hyperlipidemia, gout, chronic allopurinol treatment, previous cervical, lumbar decompression surgery, left knee replacement,  presenting with more than 3 years history of gradual onset slow worsening ascending numbness, on examination, he has  length dependent sensory changes, absent proprioception at  bilateral toes, absent bilateral lower extremity deep tendon reflexes,   He has significant lumbar stenosis at L2, 3 level, mild peripheral neuropathy,  He denies significant pain, mild distal weakness, mild unsteady gait,  After discuss with patient, we decided to hold of surgical evaluation at this point, return to clinic in 6 months  Laboratory evaluation for his peripheral neuropathy reviewed elevated A1c 6.1 will fax result to his primary care physician,  Marcial Pacas, M.D. Ph.D.  [facility] [address], [place], [place] [address] Ph: [phone] Fax: [phone]

## 2015-01-20 ENCOUNTER — Encounter: Payer: Self-pay | Admitting: Internal Medicine

## 2015-01-21 ENCOUNTER — Encounter: Payer: Self-pay | Admitting: Internal Medicine

## 2015-01-21 ENCOUNTER — Ambulatory Visit: Payer: Medicare HMO | Admitting: Neurology

## 2015-01-25 ENCOUNTER — Ambulatory Visit (INDEPENDENT_AMBULATORY_CARE_PROVIDER_SITE_OTHER): Payer: Medicare HMO | Admitting: *Deleted

## 2015-01-25 DIAGNOSIS — R55 Syncope and collapse: Secondary | ICD-10-CM | POA: Diagnosis not present

## 2015-01-27 NOTE — Progress Notes (Signed)
Loop recorder 

## 2015-01-29 ENCOUNTER — Encounter: Payer: Self-pay | Admitting: Internal Medicine

## 2015-02-01 ENCOUNTER — Encounter: Payer: Self-pay | Admitting: Internal Medicine

## 2015-02-02 LAB — CUP PACEART REMOTE DEVICE CHECK: Date Time Interrogation Session: 20160606040500

## 2015-02-03 ENCOUNTER — Encounter: Payer: Self-pay | Admitting: Internal Medicine

## 2015-02-16 ENCOUNTER — Encounter: Payer: Self-pay | Admitting: Internal Medicine

## 2015-02-23 ENCOUNTER — Encounter: Payer: Self-pay | Admitting: Internal Medicine

## 2015-02-23 ENCOUNTER — Ambulatory Visit (INDEPENDENT_AMBULATORY_CARE_PROVIDER_SITE_OTHER): Payer: Medicare HMO | Admitting: *Deleted

## 2015-02-23 DIAGNOSIS — R55 Syncope and collapse: Secondary | ICD-10-CM | POA: Diagnosis not present

## 2015-02-24 ENCOUNTER — Encounter: Payer: Self-pay | Admitting: Internal Medicine

## 2015-02-24 NOTE — Progress Notes (Signed)
Loop recorder 

## 2015-02-25 LAB — CUP PACEART REMOTE DEVICE CHECK: MDC IDC SESS DTM: 20160707153244

## 2015-03-01 ENCOUNTER — Encounter: Payer: Self-pay | Admitting: Internal Medicine

## 2015-03-25 ENCOUNTER — Ambulatory Visit (INDEPENDENT_AMBULATORY_CARE_PROVIDER_SITE_OTHER): Payer: Medicare HMO | Admitting: *Deleted

## 2015-03-25 DIAGNOSIS — R55 Syncope and collapse: Secondary | ICD-10-CM

## 2015-03-25 NOTE — Progress Notes (Signed)
Loop recorder 

## 2015-03-29 ENCOUNTER — Telehealth: Payer: Self-pay | Admitting: *Deleted

## 2015-03-29 NOTE — Telephone Encounter (Signed)
Spoke w/pt--no symptoms correlating with episode recorded

## 2015-03-29 NOTE — Telephone Encounter (Signed)
LMOM for return call. Pause episode on 8-7 around 1502. ? Symptoms

## 2015-03-30 LAB — CUP PACEART REMOTE DEVICE CHECK: MDC IDC SESS DTM: 20160808040500

## 2015-04-01 ENCOUNTER — Encounter: Payer: Self-pay | Admitting: Internal Medicine

## 2015-04-02 ENCOUNTER — Other Ambulatory Visit: Payer: Self-pay | Admitting: Internal Medicine

## 2015-04-02 ENCOUNTER — Telehealth: Payer: Self-pay | Admitting: *Deleted

## 2015-04-02 LAB — CUP PACEART REMOTE DEVICE CHECK: Date Time Interrogation Session: 20160815073352

## 2015-04-02 NOTE — Telephone Encounter (Signed)
Called patient regarding brady episode seen on LINQ transmission.  Patient asymptomatic, states he was asleep at that time.  Patient educated on use of symptom activator when he feels syncopal episodes coming on.  Patient voices understanding and is appreciate of call.  Encouraged patient to call with questions or concerns.

## 2015-04-07 ENCOUNTER — Encounter: Payer: Self-pay | Admitting: Internal Medicine

## 2015-04-08 ENCOUNTER — Encounter: Payer: Self-pay | Admitting: Internal Medicine

## 2015-04-15 ENCOUNTER — Encounter: Payer: Self-pay | Admitting: Internal Medicine

## 2015-04-23 ENCOUNTER — Encounter: Payer: Self-pay | Admitting: Internal Medicine

## 2015-04-23 ENCOUNTER — Ambulatory Visit (INDEPENDENT_AMBULATORY_CARE_PROVIDER_SITE_OTHER): Payer: Medicare HMO | Admitting: *Deleted

## 2015-04-23 DIAGNOSIS — R55 Syncope and collapse: Secondary | ICD-10-CM | POA: Diagnosis not present

## 2015-04-28 NOTE — Progress Notes (Signed)
Loop recorder 

## 2015-05-11 ENCOUNTER — Encounter: Payer: Self-pay | Admitting: *Deleted

## 2015-05-14 ENCOUNTER — Encounter: Payer: Self-pay | Admitting: Cardiology

## 2015-05-14 ENCOUNTER — Ambulatory Visit (INDEPENDENT_AMBULATORY_CARE_PROVIDER_SITE_OTHER): Payer: Medicare HMO | Admitting: Cardiology

## 2015-05-14 VITALS — BP 168/82 | HR 77 | Ht 70.0 in | Wt 182.4 lb

## 2015-05-14 DIAGNOSIS — I2510 Atherosclerotic heart disease of native coronary artery without angina pectoris: Secondary | ICD-10-CM | POA: Diagnosis not present

## 2015-05-14 DIAGNOSIS — I350 Nonrheumatic aortic (valve) stenosis: Secondary | ICD-10-CM | POA: Diagnosis not present

## 2015-05-14 DIAGNOSIS — R001 Bradycardia, unspecified: Secondary | ICD-10-CM

## 2015-05-14 NOTE — Patient Instructions (Addendum)
Medication Instructions:  Same-nochanges  Labwork: None  Testing/Procedures: None  Follow-Up: Your physician recommends that you schedule a follow-up appointment in: Early November 2016 in the [place] office.    Any Other Special Instructions Will Be Listed Below (If Applicable).

## 2015-05-14 NOTE — Assessment & Plan Note (Signed)
The patient has significant aortic stenosis. It is at least moderate. Some of the recent Doppler evaluation from April, 2016, suggest that it may be more than moderate. I have been following him very carefully. He may need to have an intervention to his aortic valve. However it is not time yet. I've discussed this carefully with him. He will see Dr. Raliegh Ip in the regional office in November, 2016. At that time, Dr. Raliegh Ip may decide to proceed with a repeat echo. He will have been greater than 6 months at that time. Dr. Raliegh Ip read his last echo. Careful decisions can then be made on an ongoing basis concerning his aortic stenosis. All of this is complicated by the fact that the patient has had syncope in the past. However I have felt that it was not directly related to his aortic stenosis. In addition he has significant intermittent bradycardia. He has a LINQ recorder in place.

## 2015-05-14 NOTE — Assessment & Plan Note (Signed)
He has definite bradycardia. Over time his beta blocker was tapered and stopped. He has a LINQ implantable recorder in place. This is monitored carefully by the electrophysiology team. He has had some nighttime pauses. However he has not had any significant symptoms. Therefore a pacemaker has not been indicated. Both his bradycardia and aortic valve disease will have to be followed in a paralleling fashion.

## 2015-05-14 NOTE — Assessment & Plan Note (Signed)
Coronary disease is stable. His last cath was 2014. He had 4 patent bypass grafts. There was no obvious site for potential ischemia to cause ventricular arrhythmias. He is no symptoms. No further workup.

## 2015-05-14 NOTE — Progress Notes (Signed)
Cardiology Office Note   Date:  05/14/2015   ID:  MYCAL CONDE, DOB 02/26/1931, MRN [id number]  PCP:  [name], MD  Cardiologist:  Dola Argyle, MD   Chief Complaint  Patient presents with  . Appointment    Follow-up aortic stenosis and history of syncope and bradycardia      History of Present Illness:  Mr. Santagata is a super nice gentleman that I have followed for many many years. 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  I have made careful plans today for him to meet and get to know Dr. Bronson Ing at the [place] office in November, 2016.  LEDON WEIHE is a 81 y.o. male who presents today to follow-up coronary disease and aortic stenosis and bradycardia and history of syncope. He's doing very well. He has a LINQ implantable recorder in place. He is followed very carefully by our electrophysiology team. He has had some nighttime, asymptomatic pauses. The patient also has aortic stenosis. His most recent echo was read as moderate. However some of the parameters suggested the possibility that it could be more severe. His coronary disease has been stable. He has not had any chest pain. He has not had any recurrent syncope.    Past Medical History  Diagnosis Date  . Hyperlipidemia   . Benign prostatic hypertrophy   . Carotid artery disease     DES, SVG to circumflex marginal, October, 2010 ( SVG to PDA and PLA patent , LIMA to LAD patent, EF 60%, mild inferior hypokinesis; H/o mild CHF, CABG  . CAD (coronary artery disease)     DES, SVG to circumflex marginal, October, 2010 ( SVG to PDA and PLA patent , LIMA to LAD patent, EF 60%, mild inferior hypokinesis  . Syncope     Remote, etiology unknown  . Psoriasis     severe; with total skin exfoliation in the past resolved  . Hx of decompressive lumbar laminectomy     Dr.Botero December, 2011  . Aortic stenosis     Mild, echo, August, 2012  . HTN (hypertension)     takes meds daily  .  Hypothyroidism   . Nephrolithiasis   . GERD (gastroesophageal reflux disease)     takes daily  . Arthritis   . Neuromuscular disorder   . PVC's (premature ventricular contractions)     May, 2014  . Ventricular tachycardia, non-sustained     Past Surgical History  Procedure Laterality Date  . Coronary artery bypass graft  1995  . Decompression of left median nerve    . Left carotid endarectomy and dacron patch angioplasty]    . Bilateral l3-4 laminectomies    . Removal of synovial cyst    . Posterior arthrodesis with autograft and allograft    . Colonoscopy  11/23/2011    Procedure: COLONOSCOPY;  Surgeon: Rogene Houston, MD;  [facility];  Service: Endoscopy;  Laterality: N/A;  730  . Back surgery      x3  . Tonsillectomy    . Appendectomy    . Total knee arthroplasty  04/16/2012    Procedure: TOTAL KNEE ARTHROPLASTY;  Surgeon: Garald Balding, MD;  Location: [place];  Service: Orthopedics;  Laterality: Left;  . Cataract extraction, bilateral    . Joint replacement Left Sept. 27, 2013    Knee  . Spine surgery      X's 3  . Carotid endarterectomy Left 02-24-08  cea  Dr. Amedeo Plenty  . Carelink remote monitoring  Oct. 29, 2015    by Dr. [name]  . Carpal tunnel release Bilateral 2010  . Left heart catheterization with coronary angiogram N/A 12/27/2012    Procedure: LEFT HEART CATHETERIZATION WITH CORONARY ANGIOGRAM;  Surgeon: Burnell Blanks, MD;  Location: [facility];  Service: Cardiovascular;  Laterality: N/A;    Patient Active Problem List   Diagnosis Date Noted  . Transient complete heart block Jan 16, 202015  . Hypertensive cardiovascular disease Jan 16, 202015  . Bradycardia 12/23/2013  . Aftercare following surgery of the circulatory system, [place] 06/30/2013  . PVC's (premature ventricular contractions)   . Neuromuscular disorder   . Nephrolithiasis   . Hypothyroidism   . Ventricular tachycardia, non-sustained   . Osteoarthritis of knee 04/19/2012  .  Hyponatremia 04/19/2012  . Hypokalemia 04/19/2012  . Postoperative anemia due to acute blood loss 04/19/2012  . Aortic stenosis   . Occlusion and stenosis of carotid artery without mention of cerebral infarction 02/13/2012  . Carotid artery disease   . Hyperlipidemia   . HTN (hypertension)   . CAD (coronary artery disease)   . Syncope   . Psoriasis   . Ejection fraction   . Fluid overload   . Carpal tunnel syndrome   . Hx of CABG   . BENIGN PROSTATIC HYPERTROPHY, HX OF 06/11/2009      Current Outpatient Prescriptions  Medication Sig Dispense Refill  . allopurinol (ZYLOPRIM) 300 MG tablet Take 600 mg by mouth daily.     Marland Kitchen aspirin EC 81 MG tablet Take 81 mg by mouth daily.    Marland Kitchen atorvastatin (LIPITOR) 20 MG tablet Take 20 mg by mouth every evening.     . fluticasone (FLONASE) 50 MCG/ACT nasal spray Place 1 spray into both nostrils 2 (two) times daily.    . furosemide (LASIX) 20 MG tablet Take 10 mg by mouth daily as needed for fluid or edema.    . furosemide (LASIX) 40 MG tablet TK 1 T PO QD IN THE MORNING  3  . latanoprost (XALATAN) 0.005 % ophthalmic solution Place 1 drop into both eyes at bedtime.    Marland Kitchen levothyroxine (SYNTHROID, LEVOTHROID) 112 MCG tablet Take 112 mcg by mouth daily before breakfast.    . multivitamin (THERAGRAN) per tablet Take 1 tablet by mouth daily.     . nitroGLYCERIN (NITROSTAT) 0.4 MG SL tablet Place 0.4 mg under the tongue every 5 (five) minutes x 3 doses as needed. For chest pain    . omeprazole (PRILOSEC) 20 MG capsule Take 20 mg by mouth daily.     Marland Kitchen pyridOXINE (VITAMIN B-6) 100 MG tablet Take 100 mg by mouth daily.     . Tamsulosin HCl (FLOMAX) 0.4 MG CAPS Take 0.4 mg by mouth daily.     . valsartan (DIOVAN) 320 MG tablet Take 320 mg by mouth daily.      No current facility-administered medications for this visit.    Allergies:   Propylene glycol    Social History:  The patient  reports that he has never smoked. He has never used smokeless  tobacco. He reports that he drinks alcohol. He reports that he does not use illicit drugs.   Family History:  The patient's family history includes Heart attack in his mother; Heart attack (age of onset: 67) in his son; Heart disease in his father and mother; Hyperlipidemia in his father; Hypertension in his father. There is no history of Colon cancer.  ROS:  Please see the history of present illness. Patient denies fever, chills, headache, sweats, rash, change in vision, change in hearing, chest pain, cough, nausea or vomiting, urinary symptoms. All other systems are reviewed and are negative.    PHYSICAL EXAM: VS:  Ht 5\' 10"  (1.778 m) , Patient is oriented to person time and place. Affect is normal. Head is atraumatic. Sclerae and conjunctivae are normal. There is no jugular venous distention. Lungs are clear. Respiratory effort is nonlabored. Cardiac exam reveals S1 and S2. There is a high pitched crescendo decrescendo systolic murmur consistent with his aortic stenosis. Abdomen is soft. There is no peripheral edema. There are no musculoskeletal deformities. There are no skin rashes.   EKG:   EKG is not done today.   Recent Labs: 12/16/2014: ALT 27; BUN 16; Creatinine, Ser 0.91; Potassium 3.9; Sodium 141; TSH 0.901    Lipid Panel    Component Value Date/Time   CHOL 143 08/06/2006 0814   TRIG 141 08/06/2006 0814   HDL 29.7* 08/06/2006 0814   CHOLHDL 4.8 CALC 08/06/2006 0814   VLDL 28 08/06/2006 0814   LDLCALC 85 08/06/2006 0814      Wt Readings from Last 3 Encounters:  01/14/15 194 lb (87.998 kg)  01/04/15 194 lb 6.4 oz (88.179 kg)  12/16/14 197 lb (89.359 kg)      Current medicines are reviewed  The patient understands his medications.     ASSESSMENT AND PLAN:

## 2015-05-19 LAB — CUP PACEART REMOTE DEVICE CHECK: Date Time Interrogation Session: 20160903180541

## 2015-05-19 NOTE — Progress Notes (Signed)
Carelink summary report received. Battery status OK. Normal device function. No new symptom, tachy, or AF episodes. 4 pause episodes--some undersensed, episode from 03/28/15 true pause, ~4 sec, patient napping and asymptomatic. 12 brady episodes, longest ~8 sec, nocturnal and asymptomatic. Monthly summary reports and ROV with JA in 12/2015.

## 2015-05-24 ENCOUNTER — Ambulatory Visit (INDEPENDENT_AMBULATORY_CARE_PROVIDER_SITE_OTHER): Payer: Medicare HMO | Admitting: *Deleted

## 2015-05-24 DIAGNOSIS — R55 Syncope and collapse: Secondary | ICD-10-CM

## 2015-05-25 NOTE — Progress Notes (Signed)
Loop recorder 

## 2015-06-10 ENCOUNTER — Encounter: Payer: Self-pay | Admitting: Internal Medicine

## 2015-06-14 LAB — CUP PACEART REMOTE DEVICE CHECK: Date Time Interrogation Session: 20161003183650

## 2015-06-14 NOTE — Progress Notes (Signed)
Carelink summary report received. Battery status OK. Normal device function. No new symptom, tachy, or AF episodes. 2 pause episodes--(1) false (undersensing), (1) pause ~3 sec, historically asymptomatic. Patient aware to use symptom activator and call if he experiences syncopal symptoms. 1 brady episode, nocturnal. Monthly summary reports and ROV with JA in 12/2015.

## 2015-06-23 ENCOUNTER — Ambulatory Visit (INDEPENDENT_AMBULATORY_CARE_PROVIDER_SITE_OTHER): Payer: Medicare HMO | Admitting: *Deleted

## 2015-06-23 ENCOUNTER — Encounter: Payer: Self-pay | Admitting: *Deleted

## 2015-06-23 DIAGNOSIS — R55 Syncope and collapse: Secondary | ICD-10-CM | POA: Diagnosis not present

## 2015-06-25 ENCOUNTER — Encounter: Payer: Self-pay | Admitting: Cardiovascular Disease

## 2015-06-25 ENCOUNTER — Ambulatory Visit (INDEPENDENT_AMBULATORY_CARE_PROVIDER_SITE_OTHER): Payer: Medicare HMO | Admitting: Cardiovascular Disease

## 2015-06-25 VITALS — BP 112/66 | HR 96 | Ht 70.0 in | Wt 181.8 lb

## 2015-06-25 DIAGNOSIS — R001 Bradycardia, unspecified: Secondary | ICD-10-CM

## 2015-06-25 DIAGNOSIS — I350 Nonrheumatic aortic (valve) stenosis: Secondary | ICD-10-CM | POA: Diagnosis not present

## 2015-06-25 DIAGNOSIS — R55 Syncope and collapse: Secondary | ICD-10-CM | POA: Diagnosis not present

## 2015-06-25 DIAGNOSIS — I25812 Atherosclerosis of bypass graft of coronary artery of transplanted heart without angina pectoris: Secondary | ICD-10-CM

## 2015-06-25 DIAGNOSIS — I739 Peripheral vascular disease, unspecified: Secondary | ICD-10-CM

## 2015-06-25 DIAGNOSIS — I779 Disorder of arteries and arterioles, unspecified: Secondary | ICD-10-CM

## 2015-06-25 DIAGNOSIS — E785 Hyperlipidemia, unspecified: Secondary | ICD-10-CM

## 2015-06-25 NOTE — Patient Instructions (Signed)
Your physician recommends that you schedule a follow-up appointment in: early May with Dr Bronson Ing   Please get echocardiogram JUST BEFORE visit   Your physician recommends that you continue on your current medications as directed. Please refer to the Current Medication list given to you today.   If you need a refill on your cardiac medications before your next appointment, please call your pharmacy.    Thank you for choosing [facility] !

## 2015-06-25 NOTE — Progress Notes (Signed)
Patient ID: [name], male   DOB: 08/26/1930, 81 y.o.   MRN: [id number]      SUBJECTIVE: [name] is an 81 year old gentleman who I am evaluating for the first time. He is a former patient of Dr. Ron Parker. 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  He has a history of coronary artery disease and CABG as well as aortic stenosis, bradycardia, and syncope. He has an implantable loop recorder and is followed by our electrophysiology team. Underwent left CEA in 2009 and followed by vascular surgery.  Most recent device interrogation on 05/24/15 demonstrated normal device function with no tachycardia or episodes of atrial fibrillation. There were 2 pauses, one which was false and due to undersensing and the second one was 3 seconds and the patient was asymptomatic. There have been no recurrent syncopal episodes. There was some nocturnal bradycardia.  Echocardiogram on 11/26/14 demonstrated normal left ventricular systolic function, EF 53-97%, grade 2 diastolic dysfunction with elevated filling pressures, wall motion abnormalities consistent with coronary artery disease, at least moderate aortic stenosis with some discordance between mean gradients and calculated valve areas. Peak velocity 3.07 m/s, mean gradient 29 m mercury. Valve area 0.89 cm. There was mild mitral and tricuspid regurgitation.  Carotid Dopplers on 07/07/2014 showed less than 40% right internal carotid artery stenosis and a patent left carotid endarterectomy.  He is feeling very well and denies chest pain, lightheadedness, dizziness, and shortness of breath. He has occasional leg swelling for which she takes Lasix as needed.  He exercises several days a week and walks on the treadmill and does the NuStep.  Soc: Retired. Married. Lives in [place]. 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 Has worked in [place] and [place].   Review of Systems: As per "subjective", otherwise negative.  Allergies    Allergen Reactions  . Propylene Glycol Hives and Rash    Current Outpatient Prescriptions  Medication Sig Dispense Refill  . allopurinol (ZYLOPRIM) 300 MG tablet Take 600 mg by mouth daily.     Marland Kitchen aspirin EC 81 MG tablet Take 81 mg by mouth daily.    Marland Kitchen atorvastatin (LIPITOR) 20 MG tablet Take 20 mg by mouth every evening.     . fluticasone (FLONASE) 50 MCG/ACT nasal spray Place 1 spray into both nostrils 2 (two) times daily.    . furosemide (LASIX) 20 MG tablet Take 10 mg by mouth daily as needed for fluid or edema.    Marland Kitchen latanoprost (XALATAN) 0.005 % ophthalmic solution Place 1 drop into both eyes at bedtime.    Marland Kitchen levothyroxine (SYNTHROID, LEVOTHROID) 112 MCG tablet Take 112 mcg by mouth daily before breakfast.    . multivitamin (THERAGRAN) per tablet Take 1 tablet by mouth daily.     . nitroGLYCERIN (NITROSTAT) 0.4 MG SL tablet Place 0.4 mg under the tongue every 5 (five) minutes x 3 doses as needed. For chest pain    . omeprazole (PRILOSEC) 20 MG capsule Take 20 mg by mouth daily.     Marland Kitchen pyridOXINE (VITAMIN B-6) 100 MG tablet Take 100 mg by mouth daily.     . Tamsulosin HCl (FLOMAX) 0.4 MG CAPS Take 0.4 mg by mouth daily.     . valsartan (DIOVAN) 320 MG tablet Take 320 mg by mouth daily.      No current facility-administered medications for this visit.    Past Medical History  Diagnosis Date  . Hyperlipidemia   . Benign prostatic hypertrophy   .  Carotid artery disease ([place])     DES, SVG to circumflex marginal, October, 2010 ( SVG to PDA and PLA patent , LIMA to LAD patent, EF 60%, mild inferior hypokinesis; H/o mild CHF, CABG  . CAD (coronary artery disease)     DES, SVG to circumflex marginal, October, 2010 ( SVG to PDA and PLA patent , LIMA to LAD patent, EF 60%, mild inferior hypokinesis  . Syncope     Remote, etiology unknown  . Psoriasis     severe; with total skin exfoliation in the past resolved  . Hx of decompressive lumbar laminectomy     Dr.Botero December, 2011  .  Aortic stenosis     Mild, echo, August, 2012  . HTN (hypertension)     takes meds daily  . Hypothyroidism   . Nephrolithiasis   . GERD (gastroesophageal reflux disease)     takes daily  . Arthritis   . Neuromuscular disorder ([place])   . PVC's (premature ventricular contractions)     May, 2014  . Ventricular tachycardia, non-sustained [facility])     Past Surgical History  Procedure Laterality Date  . Coronary artery bypass graft  1995  . Decompression of left median nerve    . Left carotid endarectomy and dacron patch angioplasty]    . Bilateral l3-4 laminectomies    . Removal of synovial cyst    . Posterior arthrodesis with autograft and allograft    . Colonoscopy  11/23/2011    Procedure: COLONOSCOPY;  Surgeon: Rogene Houston, MD;  [facility];  Service: Endoscopy;  Laterality: N/A;  730  . Back surgery      x3  . Tonsillectomy    . Appendectomy    . Total knee arthroplasty  04/16/2012    Procedure: TOTAL KNEE ARTHROPLASTY;  Surgeon: Garald Balding, MD;  Location: [place];  Service: Orthopedics;  Laterality: Left;  . Cataract extraction, bilateral    . Joint replacement Left Sept. 27, 2013    Knee  . Spine surgery      X's 3  . Carotid endarterectomy Left 02-24-08    cea  Dr. Amedeo Plenty  . Carelink remote monitoring  Oct. 29, 2015    by Dr. [name]  . Carpal tunnel release Bilateral 2010  . Left heart catheterization with coronary angiogram N/A 12/27/2012    Procedure: LEFT HEART CATHETERIZATION WITH CORONARY ANGIOGRAM;  Surgeon: Burnell Blanks, MD;  Location[facility] CATH LAB;  Service: Cardiovascular;  Laterality: N/A;    Social History   Social History  . Marital Status: Married    Spouse Name: N/A  . Number of Children: 2  . Years of Education: 16   Occupational History  . Retired    Social History Main Topics  . Smoking status: Never Smoker   . Smokeless tobacco: Never Used  . Alcohol Use: 0.0 oz/week    0 Standard drinks or equivalent per week      Comment: One beer per week.  . Drug Use: No  . Sexual Activity: Not Currently   Other Topics Concern  . Not on file   Social History Narrative   Lives at home with wife.   1 cup coffee per day.   Right-handed.     Filed Vitals:   06/25/15 0838  BP: 112/66  Pulse: 96  Height: 5\' 10"  (1.778 m)  Weight: 181 lb 12.8 oz (82.464 kg)  SpO2: 96%    PHYSICAL EXAM General: NAD HEENT: Normal. Neck: No  JVD, no thyromegaly. Lungs: Clear to auscultation bilaterally with normal respiratory effort. CV: Nondisplaced PMI.  Regular rate and rhythm, normal S1/diminished S2, no S3/S4, harsh III/VI late-peaking, ejection systolic murmur. No pretibial or periankle edema.  No carotid bruit.    Abdomen: Soft, nontender, no hepatosplenomegaly, no distention.  Neurologic: Alert and oriented x 3.  Psych: Normal affect. Skin: Normal. Musculoskeletal: Normal range of motion, no gross deformities. Extremities: No clubbing or cyanosis.   ECG: Most recent ECG reviewed.      ASSESSMENT AND PLAN: 1. CAD s/p CABG: Stable ischemic heart disease. Continue aspirin and Lipitor.  2. Aortic stenosis: At least moderate in 11/2014. S2 diminished, but patient asymptomatic. Will repeat echocardiogram in 6 months to assess for interval progression.  3. Syncope with implantable loop recorder: Interrogation from 05/2015 noted above. No recurrent episodes. Continue to monitor. Followed by EP.  4. S/p left CEA in 2009: Dopplers from 06/2014 noted above. Continue ASA and statin therapy. Followed by vascular surgery.  5. Hyperlipidemia: On Lipitor 20 mg.  Dispo: f/u May 2017.  Time spent: 40 minutes, of which greater than 50% was spent reviewing symptoms, relevant blood tests and studies, and discussing management plan with the patient.   Kate Sable, M.D., F.A.C.C.

## 2015-06-25 NOTE — Progress Notes (Signed)
Carelink Summary Report/loop recorder 

## 2015-06-28 ENCOUNTER — Encounter: Payer: Self-pay | Admitting: Family

## 2015-06-30 ENCOUNTER — Encounter: Payer: Self-pay | Admitting: Family

## 2015-06-30 ENCOUNTER — Ambulatory Visit (HOSPITAL_COMMUNITY)
Admission: RE | Admit: 2015-06-30 | Discharge: 2015-06-30 | Disposition: A | Discharge status: Home / Self Care | Payer: Medicare HMO | Source: Ambulatory Visit | Attending: Family | Admitting: Family

## 2015-06-30 ENCOUNTER — Ambulatory Visit (INDEPENDENT_AMBULATORY_CARE_PROVIDER_SITE_OTHER): Payer: Medicare HMO | Admitting: Family

## 2015-06-30 VITALS — BP 144/70 | HR 74 | Temp 97.9°F | Resp 16 | Ht 69.0 in | Wt 181.0 lb

## 2015-06-30 DIAGNOSIS — Z48812 Encounter for surgical aftercare following surgery on the circulatory system: Secondary | ICD-10-CM | POA: Diagnosis not present

## 2015-06-30 DIAGNOSIS — Z9889 Other specified postprocedural states: Secondary | ICD-10-CM

## 2015-06-30 DIAGNOSIS — I658 Occlusion and stenosis of other precerebral arteries: Secondary | ICD-10-CM | POA: Diagnosis not present

## 2015-06-30 NOTE — Progress Notes (Signed)
Established Carotid Patient   History of Present Illness  [name] is a 81 y.o. male patient of Dr. Trula Slade who is status post left CEA in 2009.  He returns today for scheduled surveillance. States he exercises at least 3 days/week on a treadmill, plays golf twice/week.  The patient denies any history of TIA or stroke symptoms, specifically the patient denies a history of amaurosis fugax or monocular blindness, denies a history unilateral facial drooping, denies a history of hemiplegia, and denies a history of receptive or expressive aphasia.  He denies claudication symptoms with walking, denies non healing wounds.   He has an implanted cardiac monitor, his cardiologist is Dr. [name], for arrhythmias and history of rare syncopal episodes that started in 2014. His cardiac medications are being adjusted to address this, a pacemaker is considered if medications do not address this arhythmia adequately, per pt.  Patient report New Medical or Surgical History: gradual worsening of numbness in lower legs over the last few years; states he was told that he has neuropathy, was told by his neurologist that he has lumbar spinal stenosis that may be contributing to his neuropathy. In addition, he had severe generalized psoriasis in 2002 that has since resolved with Enbrel treatment, states "my feet split open", this may also have contributed to his lower legs neuropathy. He had a left knee replacement in 2013.  Pt Diabetic: pre-diabetes, states a recent A1C was 6.1 Pt smoker: non-smoker  Pt meds include: Statin : Yes Betablocker: no, was stopped by Dr. [name] due several pauses  ASA: Yes Other anticoagulants/antiplatelets: no   Past Medical History  Diagnosis Date  . Hyperlipidemia   . Benign prostatic hypertrophy   . Carotid artery disease ([place])     DES, SVG to circumflex marginal, October, 2010 ( SVG to PDA and PLA patent , LIMA to LAD patent, EF 60%, mild inferior hypokinesis;  H/o mild CHF, CABG  . CAD (coronary artery disease)     DES, SVG to circumflex marginal, October, 2010 ( SVG to PDA and PLA patent , LIMA to LAD patent, EF 60%, mild inferior hypokinesis  . Syncope     Remote, etiology unknown  . Psoriasis     severe; with total skin exfoliation in the past resolved  . Hx of decompressive lumbar laminectomy     Dr.Botero December, 2011  . Aortic stenosis     Mild, echo, August, 2012  . HTN (hypertension)     takes meds daily  . Hypothyroidism   . Nephrolithiasis   . GERD (gastroesophageal reflux disease)     takes daily  . Arthritis   . Neuromuscular disorder ([place])   . PVC's (premature ventricular contractions)     May, 2014  . Ventricular tachycardia, non-sustained [facility])     Social History Social History  Substance Use Topics  . Smoking status: Never Smoker   . Smokeless tobacco: Never Used  . Alcohol Use: 0.0 oz/week    0 Standard drinks or equivalent per week     Comment: One beer per week.    Family History Family History  Problem Relation Age of Onset  . Colon cancer Neg Hx   . Heart attack Mother   . Heart disease Mother     Heart Disease before age 67  . Heart disease Father     Heart Disease before age 53  . Hypertension Father   . Hyperlipidemia Father   . Heart attack Son 70    Surgical History  Past Surgical History  Procedure Laterality Date  . Coronary artery bypass graft  1995  . Decompression of left median nerve    . Left carotid endarectomy and dacron patch angioplasty]    . Bilateral l3-4 laminectomies    . Removal of synovial cyst    . Posterior arthrodesis with autograft and allograft    . Colonoscopy  11/23/2011    Procedure: COLONOSCOPY;  Surgeon: Rogene Houston, MD;  Location: [facility];  Service: Endoscopy;  Laterality: N/A;  730  . Back surgery      x3  . Tonsillectomy    . Appendectomy    . Total knee arthroplasty  04/16/2012    Procedure: TOTAL KNEE ARTHROPLASTY;  Surgeon: Garald Balding,  MD;  Location: [place];  Service: Orthopedics;  Laterality: Left;  . Cataract extraction, bilateral    . Joint replacement Left Sept. 27, 2013    Knee  . Spine surgery      X's 3  . Carotid endarterectomy Left 02-24-08    cea  Dr. Amedeo Plenty  . Carelink remote monitoring  Oct. 29, 2015    by Dr. [name]  . Carpal tunnel release Bilateral 2010  . Left heart catheterization with coronary angiogram N/A 12/27/2012    Procedure: LEFT HEART CATHETERIZATION WITH CORONARY ANGIOGRAM;  Surgeon: Burnell Blanks, MD;  Location: [facility] CATH LAB;  Service: Cardiovascular;  Laterality: N/A;    Allergies  Allergen Reactions  . Propylene Glycol Hives and Rash    Current Outpatient Prescriptions  Medication Sig Dispense Refill  . allopurinol (ZYLOPRIM) 300 MG tablet Take 600 mg by mouth daily.     Marland Kitchen aspirin EC 81 MG tablet Take 81 mg by mouth daily.    Marland Kitchen atorvastatin (LIPITOR) 20 MG tablet Take 20 mg by mouth every evening.     . fluticasone (FLONASE) 50 MCG/ACT nasal spray Place 1 spray into both nostrils 2 (two) times daily.    . furosemide (LASIX) 20 MG tablet Take 10 mg by mouth daily as needed for fluid or edema.    Marland Kitchen latanoprost (XALATAN) 0.005 % ophthalmic solution Place 1 drop into both eyes at bedtime.    Marland Kitchen levothyroxine (SYNTHROID, LEVOTHROID) 112 MCG tablet Take 112 mcg by mouth daily before breakfast.    . multivitamin (THERAGRAN) per tablet Take 1 tablet by mouth daily.     . nitroGLYCERIN (NITROSTAT) 0.4 MG SL tablet Place 0.4 mg under the tongue every 5 (five) minutes x 3 doses as needed. For chest pain    . omeprazole (PRILOSEC) 20 MG capsule Take 20 mg by mouth daily.     Marland Kitchen pyridOXINE (VITAMIN B-6) 100 MG tablet Take 100 mg by mouth daily.     . Tamsulosin HCl (FLOMAX) 0.4 MG CAPS Take 0.4 mg by mouth daily.     . valsartan (DIOVAN) 320 MG tablet Take 320 mg by mouth daily.      No current facility-administered medications for this visit.    Review of Systems : See HPI for  pertinent positives and negatives.  Physical Examination  Filed Vitals:   06/30/15 1203 06/30/15 1204 06/30/15 1206  BP: 148/75 154/82 144/70  Pulse: 96 74 74  Temp: 97.9 F (36.6 C)    Resp: 16    Height: 5\' 9"  (1.753 m)    Weight: 181 lb (82.101 kg)    SpO2: 96%     Body mass index is 26.72 kg/(m^2).  General: WDWN male in NAD GAIT: normal  Eyes: PERRLA Pulmonary: CTAB, no rales, rhonchi, or wheezes.  Cardiac: regular rhythm with rare missed contraction, positive murmur.  VASCULAR EXAM Carotid [place] Left Right   Transmitted cardiac murmur Transmitted cardiac murmur   Aorta is not palpable. Radial pulses are 2+ palpable and equal.      LE Pulses LEFT RIGHT   POPLITEAL not palpable  not palpable   POSTERIOR TIBIAL not palpable  not palpable    DORSALIS PEDIS  ANTERIOR TIBIAL Faintly palpable  Faintly palpable     Gastrointestinal: soft, nontender, BS WNL, no r/g, no palpated masses.  Musculoskeletal: Negative muscle atrophy/wasting. M/S 5/5 un UE, 4/5 in LE, Extremities without ischemic changes.  Neurologic: A&O X 3; Appropriate Affect, Speech is normal CN 2-12 intact, Pain and light touch intact in extremities, Motor exam as listed above.          Non-Invasive Vascular Imaging CAROTID DUPLEX 06/30/2015   Right ICA: patent CEA site with no evidence of restenosis. Left ICA: 1 - 39 % stenosis. No significant change compared to 07/07/14.   Assessment: CHRISTOFFER CURRIER is a 81 y.o. male who is status post left CEA in 2009. He has no history of stroke of TIA. Today's carotid duplex suggests a patent right CEA site and minimal stenosis of the left ICA. No significant change compared to 07/07/14.    Plan: Follow-up in 1 year with Carotid Duplex scan.   I discussed in  depth with the patient the nature of atherosclerosis, and emphasized the importance of maximal medical management including strict control of blood pressure, blood glucose, and lipid levels, obtaining regular exercise, and continued cessation of smoking.  The patient is aware that without maximal medical management the underlying atherosclerotic disease process will progress, limiting the benefit of any interventions. The patient was given information about stroke prevention and what symptoms should prompt the patient to seek immediate medical care. Thank you for allowing [place] to participate in this patient's care.  Clemon Chambers, RN, MSN, FNP-C [facility] Office: [phone]  Clinic Physician: Scot Dock  06/30/2015 12:03 PM

## 2015-06-30 NOTE — Addendum Note (Signed)
Addended by: Dorthula Rue L on: 06/30/2015 03:29 PM   Modules accepted: Orders

## 2015-06-30 NOTE — Progress Notes (Signed)
Filed Vitals:   06/30/15 1203 06/30/15 1204 06/30/15 1206  BP: 148/75 154/82 144/70  Pulse: 96 74 74  Temp: 97.9 F (36.6 C)    Resp: 16    Height: 5\' 9"  (1.753 m)    Weight: 181 lb (82.101 kg)    SpO2: 96%

## 2015-06-30 NOTE — Patient Instructions (Signed)
Stroke Prevention Some medical conditions and behaviors are associated with an increased chance of having a stroke. You may prevent a stroke by making healthy choices and managing medical conditions. HOW CAN I REDUCE MY RISK OF HAVING A STROKE?   Stay physically active. Get at least 30 minutes of activity on most or all days.  Do not smoke. It may also be helpful to avoid exposure to secondhand smoke.  Limit alcohol use. Moderate alcohol use is considered to be:  No more than 2 drinks per day for men.  No more than 1 drink per day for nonpregnant women.  Eat healthy foods. This involves:  Eating 5 or more servings of fruits and vegetables a day.  Making dietary changes that address high blood pressure (hypertension), high cholesterol, diabetes, or obesity.  Manage your cholesterol levels.  Making food choices that are high in fiber and low in saturated fat, trans fat, and cholesterol may control cholesterol levels.  Take any prescribed medicines to control cholesterol as directed by your health care provider.  Manage your diabetes.  Controlling your carbohydrate and sugar intake is recommended to manage diabetes.  Take any prescribed medicines to control diabetes as directed by your health care provider.  Control your hypertension.  Making food choices that are low in salt (sodium), saturated fat, trans fat, and cholesterol is recommended to manage hypertension.  Ask your health care provider if you need treatment to lower your blood pressure. Take any prescribed medicines to control hypertension as directed by your health care provider.  If you are 18-39 years of age, have your blood pressure checked every 3-5 years. If you are 40 years of age or older, have your blood pressure checked every year.  Maintain a healthy weight.  Reducing calorie intake and making food choices that are low in sodium, saturated fat, trans fat, and cholesterol are recommended to manage  weight.  Stop drug abuse.  Avoid taking birth control pills.  Talk to your health care provider about the risks of taking birth control pills if you are over 35 years old, smoke, get migraines, or have ever had a blood clot.  Get evaluated for sleep disorders (sleep apnea).  Talk to your health care provider about getting a sleep evaluation if you snore a lot or have excessive sleepiness.  Take medicines only as directed by your health care provider.  For some people, aspirin or blood thinners (anticoagulants) are helpful in reducing the risk of forming abnormal blood clots that can lead to stroke. If you have the irregular heart rhythm of atrial fibrillation, you should be on a blood thinner unless there is a good reason you cannot take them.  Understand all your medicine instructions.  Make sure that other conditions (such as anemia or atherosclerosis) are addressed. SEEK IMMEDIATE MEDICAL CARE IF:   You have sudden weakness or numbness of the face, arm, or leg, especially on one side of the body.  Your face or eyelid droops to one side.  You have sudden confusion.  You have trouble speaking (aphasia) or understanding.  You have sudden trouble seeing in one or both eyes.  You have sudden trouble walking.  You have dizziness.  You have a loss of balance or coordination.  You have a sudden, severe headache with no known cause.  You have new chest pain or an irregular heartbeat. Any of these symptoms may represent a serious problem that is an emergency. Do not wait to see if the symptoms will   go away. Get medical help at once. Call your local emergency services (911 in [place] Do not drive yourself to the hospital.   This information is not intended to replace advice given to you by your health care provider. Make sure you discuss any questions you have with your health care provider.   Document Released: 09/14/2004 Document Revised: 08/28/2014 Document Reviewed:  02/07/2013 Elsevier Interactive Patient Education 2016 [employer]  

## 2015-07-01 ENCOUNTER — Encounter: Payer: Self-pay | Admitting: Internal Medicine

## 2015-07-12 ENCOUNTER — Other Ambulatory Visit (HOSPITAL_COMMUNITY): Payer: Self-pay

## 2015-07-12 ENCOUNTER — Ambulatory Visit: Payer: Self-pay | Admitting: Family

## 2015-07-12 ENCOUNTER — Ambulatory Visit: Payer: Medicare Other | Admitting: Family

## 2015-07-12 ENCOUNTER — Other Ambulatory Visit (HOSPITAL_COMMUNITY): Payer: Medicare Other

## 2015-07-22 ENCOUNTER — Encounter: Payer: Self-pay | Admitting: Neurology

## 2015-07-22 ENCOUNTER — Ambulatory Visit (INDEPENDENT_AMBULATORY_CARE_PROVIDER_SITE_OTHER): Payer: Medicare HMO | Admitting: Neurology

## 2015-07-22 VITALS — BP 157/67 | HR 69 | Ht 69.0 in | Wt 178.0 lb

## 2015-07-22 DIAGNOSIS — R269 Unspecified abnormalities of gait and mobility: Secondary | ICD-10-CM | POA: Diagnosis not present

## 2015-07-22 DIAGNOSIS — M4806 Spinal stenosis, lumbar region: Secondary | ICD-10-CM | POA: Diagnosis not present

## 2015-07-22 DIAGNOSIS — M48061 Spinal stenosis, lumbar region without neurogenic claudication: Secondary | ICD-10-CM

## 2015-07-22 NOTE — Progress Notes (Signed)
PATIENT: [name] DOB: 01-09-1931  HISTORICAL  [name] is 81 years old left-handed male, referred by his primary care physician Dr. Willey Blade for evaluation of peripheral neuropathy in May [id number]  He has complicated medical history, gout, has been on allopurinol treatment 300 mg twice a day since 2006, previous history of severe psoriasis with whole-body skin exfoliation now is in remission, coronary artery disease, status post open heart surgery, syncope, had ICD implant, left carotid endarterectomy, previous cervical decompression surgery for cervical radiculopathy, lumbar decompression surgery for severe low back pain, left knee replacement in August 2013   Prior to his left knee replacement, he noticed bilateral foot, leg numbness, gradually getting worse over the past 3 years, now has dense numbness of bilateral lower extremity from knee down," like walking on two piece of wooden block", sometimes he has difficulty feeling the paddle when driving, he also noticed unbalanced gait, but no significant weakness  Laboratory evaluation December 01 2014, normal vitamin B12 age 61, CBC showed mild anemia hemoglobin 11.8   He denies significant neck pain, low back pain, no bowel and bladder incontinence, he denies bilateral upper extremity paresthesia.   UPDATE May 26th 2016: EMG nerve conduction study in May 2016, showed mild peripheral neuropathy, mild chronic bilateral lumbosacral radiculopathy. Laboratory evaluation showed elevated A1c 6.1, elevated glucose 121, otherwise normal or negative CMP CBC, CP K, TSH, B12, RPR, Lyme titer, protein electrophoresis  We have reviewed MRI of lumbar,  L2-L3 there has been a prior right hemilaminectomy. There is transverse spinal stenosis due to severe facet hypertrophy, retrolisthesis, endplate spurring and disc protrusion. Moderately severe foraminal narrowing could cause compression of either of the exiting L2 nerve roots. 2. At L3-L4, there has  been prior decompression surgery. There is severe facet hypertrophy, left greater than right, disc bulging and left endplate spurring. There is moderately severe foraminal narrowing that could lead to compression of either of the L3 nerve roots.  At L4-L5, there has been prior decompression surgery and the posterior elements might be fused. There is moderately severe foraminal narrowing to either side that could lead to compression of either of the L4 nerve roots. Moderate degenerative changes are noted at T12-L1, L1-L2 and L5-S1 with little potential for nerve root compression at those levels.  He has lumbar decompression surgery twice, most recent was 07/2010 by Dr. Joya Salm, he has no significant low back pain, no bowel and bladder incontinence, mild unsteady gait, he does not want to consider lumbar decompression surgery at this point   UPDATE Jul 22 2015: He complains of progressive worsening bilateral lower extremity numbness from knee down, mild unsteady gait, especially walking better feet, he denies bowel and bladder incontinence, he denies significant low back pain,  We again personally reviewed MRI of the lumbar film in May 2016, multilevel degenerative disc disease, most severe at L3-4, with moderate to severe canal stenosis, bilateral foraminal stenosis,  REVIEW OF SYSTEMS: Full 14 system review of systems performed and notable only for as above ALLERGIES: Allergies  Allergen Reactions  . Propylene Glycol Hives and Rash    HOME MEDICATIONS: Current Outpatient Prescriptions  Medication Sig Dispense Refill  . allopurinol (ZYLOPRIM) 300 MG tablet Take 600 mg by mouth daily.     Marland Kitchen aspirin EC 81 MG tablet Take 81 mg by mouth daily.    Marland Kitchen atorvastatin (LIPITOR) 20 MG tablet Take 20 mg by mouth every evening.     . fluticasone (FLONASE) 50 MCG/ACT nasal spray  Place 1 spray into both nostrils 2 (two) times daily.    . furosemide (LASIX) 20 MG tablet Take 10 mg by mouth daily as needed for  fluid or edema.    Marland Kitchen latanoprost (XALATAN) 0.005 % ophthalmic solution Place 1 drop into both eyes at bedtime.    Marland Kitchen levothyroxine (SYNTHROID, LEVOTHROID) 112 MCG tablet Take 112 mcg by mouth daily before breakfast.    . multivitamin (THERAGRAN) per tablet Take 1 tablet by mouth daily.     . nitroGLYCERIN (NITROSTAT) 0.4 MG SL tablet Place 0.4 mg under the tongue every 5 (five) minutes x 3 doses as needed. For chest pain    . omeprazole (PRILOSEC) 20 MG capsule Take 20 mg by mouth daily.     Marland Kitchen pyridOXINE (VITAMIN B-6) 100 MG tablet Take 100 mg by mouth daily.     . Tamsulosin HCl (FLOMAX) 0.4 MG CAPS Take 0.4 mg by mouth daily.     . valsartan (DIOVAN) 320 MG tablet Take 320 mg by mouth daily.      No current facility-administered medications for this visit.    PAST MEDICAL HISTORY: Past Medical History  Diagnosis Date  . Hyperlipidemia   . Benign prostatic hypertrophy   .  coronary artery disease     DES, SVG to circumflex marginal, October, 2010 ( SVG to PDA and PLA patent , LIMA to LAD patent, EF 60%, mild inferior hypokinesis; H/o mild CHF, CABG  . CAD (coronary artery disease)     DES, SVG to circumflex marginal, October, 2010 ( SVG to PDA and PLA patent , LIMA to LAD patent, EF 60%, mild inferior hypokinesis  . Syncope     Remote, etiology unknown  . Psoriasis     severe; with total skin exfoliation in the past resolved  . Hx of decompressive lumbar laminectomy     Dr.Botero December, 2011  . Aortic stenosis     Mild, echo, August, 2012  . HTN (hypertension)     takes meds daily  . Hypothyroidism   . Nephrolithiasis   . GERD (gastroesophageal reflux disease)     takes daily  . Arthritis   . Neuromuscular disorder   . PVC's (premature ventricular contractions)     May, 2014  . Ventricular tachycardia, non-sustained     PAST SURGICAL HISTORY: Past Surgical History  Procedure Laterality Date  . Coronary artery bypass graft  1995  . Decompression of left median nerve     . Left carotid endarectomy and dacron patch angioplasty]    . Bilateral l3-4 laminectomies    . Removal of synovial cyst    . Posterior arthrodesis with autograft and allograft    . Colonoscopy  11/23/2011    Procedure: COLONOSCOPY;  Surgeon: Rogene Houston, MD;  [facility];  Service: Endoscopy;  Laterality: N/A;  730  . Back surgery      x3  . Tonsillectomy    . Appendectomy    . Total knee arthroplasty  04/16/2012    Procedure: TOTAL KNEE ARTHROPLASTY;  Surgeon: Garald Balding, MD;  Location: [facility];  Service: Orthopedics;  Laterality: Left;  . Cataract extraction, bilateral    . Joint replacement Left Sept. 27, 2013    Knee  . Spine surgery      X's 3  . Carotid endarterectomy Left 02-24-08    cea  Dr. Amedeo Plenty  . Carelink remote monitoring  Oct. 29, 2015    by Dr. [name]  . Carpal  tunnel release Bilateral 2010  . Left heart catheterization with coronary angiogram N/A 12/27/2012    Procedure: LEFT HEART CATHETERIZATION WITH CORONARY ANGIOGRAM;  Surgeon: Burnell Blanks, MD;  Location: [facility] CATH LAB;  Service: Cardiovascular;  Laterality: N/A;    FAMILY HISTORY: Family History  Problem Relation Age of Onset  . Colon cancer Neg Hx   . Heart attack Mother   . Heart disease Mother     Heart Disease before age 24  . Heart disease Father     Heart Disease before age 53  . Hypertension Father   . Hyperlipidemia Father   . Heart attack Son 38    SOCIAL HISTORY:  History   Social History  . Marital Status: Married    Spouse Name: N/A  . Number of Children: 2  . Years of Education: N/A   Occupational History  . Not on file.   Social History Main Topics  . Smoking status: Never Smoker   . Smokeless tobacco: Never Used  . Alcohol Use: Yes     Comment: Occasional  . Drug Use: No  . Sexual Activity: Not Currently   Other Topics Concern  . Not on file   Social History Narrative     PHYSICAL EXAM   Filed Vitals:   07/22/15 0726  BP: 157/67   Pulse: 69  Height: 5\' 9"  (1.753 m)  Weight: 178 lb (80.74 kg)    Not recorded      Body mass index is 26.27 kg/(m^2).  PHYSICAL EXAMNIATION:  Gen: NAD, conversant, well nourised, obese, well groomed                     Cardiovascular: Regular rate rhythm, no peripheral edema, warm, nontender. Eyes: Conjunctivae clear without exudates or hemorrhage Neck: Supple, no carotid bruise. Pulmonary: Clear to auscultation bilaterally   NEUROLOGICAL EXAM:  MENTAL STATUS: Speech:    Speech is normal; fluent and spontaneous with normal comprehension.  Cognition:    The patient is oriented to person, place, and time;     recent and remote memory intact;     language fluent;     normal attention, concentration,     fund of knowledge.  CRANIAL NERVES: CN II: Visual fields are full to confrontation. Fundoscopic exam is normal with sharp discs and no vascular changes. Venous pulsations are present bilaterally. Pupils are 4 mm and briskly reactive to light. Visual acuity is 20/20 bilaterally. CN III, IV, VI: extraocular movement are normal. No ptosis. CN V: Facial sensation is intact to pinprick in all 3 divisions bilaterally. Corneal responses are intact.  CN VII: Face is symmetric with normal eye closure and smile. CN VIII: Hearing is normal to rubbing fingers CN IX, X: Palate elevates symmetrically. Phonation is normal. CN XI: Head turning and shoulder shrug are intact CN XII: Tongue is midline with normal movements and no atrophy.  MOTOR: There is no pronator drift of out-stretched arms. he has mild bilateral intrinsic hand muscle atrophy  Billateral upper extremity proximal muscle strength is normal, he has mild bilateral finger abduction weakness, bilateral lower extremity proximal muscle strength is normal, mild bilateral toe extension, flexion weakness   REFLEXES: Reflexes are  1 and symmetric at the biceps, triceps,  absent at bilateral knees, and ankles. Plantar responses are  flexor.  SENSORY:  length dependent decreased light touch, pinprick to knee level, absent  position senseat toes , and vibration sense are intact in fingers and decreased at  toes.  COORDINATION: Rapid alternating movements and fine finger movements are intact. There is no dysmetria on finger-to-nose and heel-knee-shin. There are no abnormal or extraneous movements.   GAIT/STANCE: He needs push up to get up from seated position, wide-based, cautious, mildly unsteady, has difficulty standing on heels, was able to stand on tiptoe Romberg is present   DIAGNOSTIC DATA (LABS, IMAGING, TESTING) - I reviewed patient records, labs, notes, testing and imaging myself where available.    ASSESSMENT AND PLAN  [name] is a 81 y.o. male  With past medical history of hypertension, hyperlipidemia, gout, chronic allopurinol treatment, previous cervical, lumbar decompression surgery, left knee replacement,  presenting with more than 3 years history of gradual onset slow worsening ascending numbness, on examination, he has  length dependent sensory changes, absent proprioception at bilateral toes, absent bilateral lower extremity deep tendon reflexes,   Lumbar stenosis  Progressive worsening bilateral lower extremity paresthesia, mildly increased gait difficulty   Significant stenosis at L3-4 level, multilevel moderate to severe foraminal stenosis,   After extensive discussion with patient, decided to refer him to neurosurgeon for potential lumbar decompression surgery  Peripheral neuropathy  Marcial Pacas, M.D. Ph.D.  [facility] [address] [place], [place] [address] Ph: [phone] Fax: [phone]

## 2015-07-23 ENCOUNTER — Ambulatory Visit (INDEPENDENT_AMBULATORY_CARE_PROVIDER_SITE_OTHER): Payer: Medicare HMO | Admitting: *Deleted

## 2015-07-23 ENCOUNTER — Encounter: Payer: Self-pay | Admitting: *Deleted

## 2015-07-23 DIAGNOSIS — R55 Syncope and collapse: Secondary | ICD-10-CM

## 2015-07-23 NOTE — Progress Notes (Signed)
Patient presents to the office w/ c/o a syncopal episode from 12/1 @ ~1930. Patient states the episode occurred without warning. Wife states that pt fell suddenly and starting "shaking" for 1 minute. ILR recorded a 3 second pause at 2003. Chanetta Marshall, NP informed of the incident and spoke to patient about what happened. Amber feels very strongly that the pause was unrelated to the syncopal event given the amount of time he was unconscious and the symptoms described by his wife. Amber recommended that the patient go to the ER for further evaluation. Patient voiced reluctance since he has an appt w/ his PCP later today. Amber reiterated the importance of further eval. Patient/wife voiced understanding. Patient aware of driving restrictions.   Amber notified Dr.Allred about incident and he also feels that the patient's syncopal episode and the pause are unrelated.

## 2015-07-25 LAB — CUP PACEART REMOTE DEVICE CHECK: Date Time Interrogation Session: 20161102190656

## 2015-07-25 NOTE — Progress Notes (Signed)
Carelink summary report received. Battery status OK. Normal device function. No new symptom episodes, tachy episodes, brady, or pause episodes. No new AF episodes. Monthly summary reports and ROV with JA in 12/2015.

## 2015-07-26 NOTE — Progress Notes (Signed)
Carelink Summary Report / Loop Recorder 

## 2015-07-27 ENCOUNTER — Telehealth: Payer: Self-pay | Admitting: Neurology

## 2015-07-27 NOTE — Telephone Encounter (Signed)
Patient is calling as he needs a disc for your MRI of lower spine for another doctor's appointment.  Please call.

## 2015-07-27 NOTE — Telephone Encounter (Signed)
Called patient back - stated he had already called [facility] and are getting the disc ready for him.

## 2015-08-18 ENCOUNTER — Ambulatory Visit: Payer: Self-pay | Admitting: Family

## 2015-08-18 ENCOUNTER — Encounter (HOSPITAL_COMMUNITY): Payer: Self-pay

## 2015-08-24 ENCOUNTER — Ambulatory Visit (INDEPENDENT_AMBULATORY_CARE_PROVIDER_SITE_OTHER): Payer: Medicare HMO | Admitting: *Deleted

## 2015-08-24 DIAGNOSIS — R55 Syncope and collapse: Secondary | ICD-10-CM

## 2015-08-24 NOTE — Progress Notes (Signed)
Carelink Summary Report / Loop Recorder 

## 2015-09-21 ENCOUNTER — Ambulatory Visit (INDEPENDENT_AMBULATORY_CARE_PROVIDER_SITE_OTHER): Payer: Medicare HMO | Admitting: *Deleted

## 2015-09-21 DIAGNOSIS — R55 Syncope and collapse: Secondary | ICD-10-CM | POA: Diagnosis not present

## 2015-09-21 LAB — CUP PACEART REMOTE DEVICE CHECK: MDC IDC SESS DTM: 20161202190719

## 2015-09-21 NOTE — Progress Notes (Signed)
Carelink Summary Report / Loop Recorder 

## 2015-09-29 LAB — CUP PACEART REMOTE DEVICE CHECK: Date Time Interrogation Session: 20170101193733

## 2015-10-21 ENCOUNTER — Ambulatory Visit (INDEPENDENT_AMBULATORY_CARE_PROVIDER_SITE_OTHER): Payer: Medicare HMO | Admitting: *Deleted

## 2015-10-21 DIAGNOSIS — R55 Syncope and collapse: Secondary | ICD-10-CM

## 2015-10-22 ENCOUNTER — Telehealth: Payer: Self-pay | Admitting: *Deleted

## 2015-10-22 NOTE — Telephone Encounter (Signed)
Device clinic sent call to triage.  Pt unhappy because he has not been feeling well and wants to be seen sooner than app in [place] office Tuesday 10/26/15.  Pt feels he needs a heart cath and wants to be seen here monday. Pt currently in [place]. C/o SOB w/ exertion not at rest  x 1 week. No difficulty currently with SOB in speech while raising voice. c/o off and on ankle edema.  Denies CP, no other symptoms. Pt does not see a cardiologist at [address] office. I explained why he needs to stay with the cardiologist office he is established with and told him to go to ED with any further symptoms. He verbalized that if something happened to him his wife would pursue action. I told him I would forward this to my Midwife. Pt unhappy at end of call. Georgana Curio RN was updated.

## 2015-10-22 NOTE — Progress Notes (Signed)
Carelink Summary Report / Loop Recorder 

## 2015-10-25 ENCOUNTER — Ambulatory Visit (INDEPENDENT_AMBULATORY_CARE_PROVIDER_SITE_OTHER): Payer: Medicare HMO | Admitting: Cardiovascular Disease

## 2015-10-25 ENCOUNTER — Encounter: Payer: Self-pay | Admitting: Cardiovascular Disease

## 2015-10-25 VITALS — BP 126/70 | HR 91 | Ht 69.0 in | Wt 178.0 lb

## 2015-10-25 DIAGNOSIS — Z951 Presence of aortocoronary bypass graft: Secondary | ICD-10-CM

## 2015-10-25 DIAGNOSIS — R0600 Dyspnea, unspecified: Secondary | ICD-10-CM

## 2015-10-25 DIAGNOSIS — R001 Bradycardia, unspecified: Secondary | ICD-10-CM

## 2015-10-25 DIAGNOSIS — R0609 Other forms of dyspnea: Secondary | ICD-10-CM | POA: Diagnosis not present

## 2015-10-25 DIAGNOSIS — I350 Nonrheumatic aortic (valve) stenosis: Secondary | ICD-10-CM | POA: Diagnosis not present

## 2015-10-25 DIAGNOSIS — I10 Essential (primary) hypertension: Secondary | ICD-10-CM

## 2015-10-25 DIAGNOSIS — E785 Hyperlipidemia, unspecified: Secondary | ICD-10-CM

## 2015-10-25 DIAGNOSIS — I25768 Atherosclerosis of bypass graft of coronary artery of transplanted heart with other forms of angina pectoris: Secondary | ICD-10-CM

## 2015-10-25 DIAGNOSIS — I779 Disorder of arteries and arterioles, unspecified: Secondary | ICD-10-CM

## 2015-10-25 DIAGNOSIS — I739 Peripheral vascular disease, unspecified: Secondary | ICD-10-CM

## 2015-10-25 DIAGNOSIS — R55 Syncope and collapse: Secondary | ICD-10-CM

## 2015-10-25 NOTE — Patient Instructions (Signed)
Your physician recommends that you schedule a follow-up appointment in:  2 weeks Dr Bronson Ing   Your physician recommends that you continue on your current medications as directed. Please refer to the Current Medication list given to you today.   Your physician has requested that you have an echocardiogram. Echocardiography is a painless test that uses sound waves to create images of your heart. It provides your doctor with information about the size and shape of your heart and how well your heart's chambers and valves are working. This procedure takes approximately one hour. There are no restrictions for this procedure.    Your physician has requested that you have a lexiscan myoview. For further information please visit [email or web address]. Please follow instruction sheet, as given.     Thank you for choosing [facility] !

## 2015-10-25 NOTE — Progress Notes (Signed)
Patient ID: MARC MIARS, male   DOB: [birth year], 82 y.o.   MRN: [id number]      SUBJECTIVE: Mr. Moose is an 82 year old gentleman whom I last evaluated on 06/25/15. 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  He has a history of coronary artery disease and CABG as well as aortic stenosis, bradycardia, and syncope. He has an implantable loop recorder and is followed by our electrophysiology team. Underwent left CEA in 2009 and followed by vascular surgery.  Echocardiogram on 11/26/14 demonstrated normal left ventricular systolic function, EF 0000000, grade 2 diastolic dysfunction with elevated filling pressures, wall motion abnormalities consistent with coronary artery disease, at least moderate aortic stenosis with some discordance between mean gradients and calculated valve areas. Peak velocity 3.07 m/s, mean gradient 29 m mercury. Valve area 0.89 cm. There was mild mitral and tricuspid regurgitation.  Device interrogation on 08/24/15 demonstrated normal device function with no evidence of tachycardia, bradycardia, or episodes of atrial fibrillation.  Carotid Dopplers on 06/30/2015 showed less than 40% right internal carotid artery stenosis and a patent left carotid endarterectomy.  He was in [place] last Friday (10/22/15) and called our office demanding to undergo coronary angiography. He had been having exertional dyspnea for one week prior but denied chest pain.  Upon speaking to him further, he tells me he notice exertional dyspnea when walking 25-50 yards. He sleeps with 2 pillows for comfort and not for breathing. He does describe at least 2 episodes of paroxysmal nocturnal dyspnea. He had intermittent ankle swelling last week and took 20 mg of Lasix 2-3 times. He denies exertional chest pain as well as dizziness and syncope.   Soc: Retired. Married to [place]. Lives in [place]. 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 Has worked in [place] and [place].   Review  of Systems: As per "subjective", otherwise negative.  Allergies  Allergen Reactions  . Propylene Glycol Hives and Rash    Current Outpatient Prescriptions  Medication Sig Dispense Refill  . allopurinol (ZYLOPRIM) 300 MG tablet Take 600 mg by mouth daily.     Marland Kitchen aspirin EC 81 MG tablet Take 81 mg by mouth daily.    Marland Kitchen atorvastatin (LIPITOR) 20 MG tablet Take 20 mg by mouth every evening.     . fluticasone (FLONASE) 50 MCG/ACT nasal spray Place 1 spray into both nostrils 2 (two) times daily.    . furosemide (LASIX) 20 MG tablet Take 10 mg by mouth daily as needed for fluid or edema. Reported on 10/25/2015    . latanoprost (XALATAN) 0.005 % ophthalmic solution Place 1 drop into both eyes at bedtime.    Marland Kitchen levothyroxine (SYNTHROID, LEVOTHROID) 112 MCG tablet Take 112 mcg by mouth daily before breakfast.    . multivitamin (THERAGRAN) per tablet Take 1 tablet by mouth daily.     . nitroGLYCERIN (NITROSTAT) 0.4 MG SL tablet Place 0.4 mg under the tongue every 5 (five) minutes x 3 doses as needed. For chest pain    . omeprazole (PRILOSEC) 20 MG capsule Take 20 mg by mouth daily.     Marland Kitchen pyridOXINE (VITAMIN B-6) 100 MG tablet Take 100 mg by mouth daily.     . Tamsulosin HCl (FLOMAX) 0.4 MG CAPS Take 0.4 mg by mouth daily.     . valsartan (DIOVAN) 320 MG tablet Take 320 mg by mouth daily.      No current facility-administered medications for this visit.    Past Medical History  Diagnosis  Date  . Hyperlipidemia   . Benign prostatic hypertrophy   . Carotid artery disease ([place])     DES, SVG to circumflex marginal, October, 2010 ( SVG to PDA and PLA patent , LIMA to LAD patent, EF 60%, mild inferior hypokinesis; H/o mild CHF, CABG  . CAD (coronary artery disease)     DES, SVG to circumflex marginal, October, 2010 ( SVG to PDA and PLA patent , LIMA to LAD patent, EF 60%, mild inferior hypokinesis  . Syncope     Remote, etiology unknown  . Psoriasis     severe; with total skin exfoliation in the past  resolved  . Hx of decompressive lumbar laminectomy     Dr.Botero December, 2011  . Aortic stenosis     Mild, echo, August, 2012  . HTN (hypertension)     takes meds daily  . Hypothyroidism   . Nephrolithiasis   . GERD (gastroesophageal reflux disease)     takes daily  . Arthritis   . Neuromuscular disorder ([place])   . PVC's (premature ventricular contractions)     May, 2014  . Ventricular tachycardia, non-sustained [facility])     Past Surgical History  Procedure Laterality Date  . Coronary artery bypass graft  1995  . Decompression of left median nerve    . Left carotid endarectomy and dacron patch angioplasty]    . Bilateral l3-4 laminectomies    . Removal of synovial cyst    . Posterior arthrodesis with autograft and allograft    . Colonoscopy  11/23/2011    Procedure: COLONOSCOPY;  Surgeon: Rogene Houston, MD;  [facility];  Service: Endoscopy;  Laterality: N/A;  730  . Back surgery      x3  . Tonsillectomy    . Appendectomy    . Total knee arthroplasty  04/16/2012    Procedure: TOTAL KNEE ARTHROPLASTY;  Surgeon: Garald Balding, MD;  Location: [facility];  Service: Orthopedics;  Laterality: Left;  . Cataract extraction, bilateral    . Joint replacement Left Sept. 27, 2013    Knee  . Spine surgery      X's 3  . Carotid endarterectomy Left 02-24-08    cea  Dr. Amedeo Plenty  . Carelink remote monitoring  Oct. 29, 2015    by Dr. [name]  . Carpal tunnel release Bilateral 2010  . Left heart catheterization with coronary angiogram N/A 12/27/2012    Procedure: LEFT HEART CATHETERIZATION WITH CORONARY ANGIOGRAM;  Surgeon: Burnell Blanks, MD;  Location: [facility] CATH LAB;  Service: Cardiovascular;  Laterality: N/A;    Social History   Social History  . Marital Status: Married    Spouse Name: N/A  . Number of Children: 2  . Years of Education: 16   Occupational History  . Retired    Social History Main Topics  . Smoking status: Never Smoker   . Smokeless tobacco:  Never Used  . Alcohol Use: No     Comment: One beer per week.  . Drug Use: No  . Sexual Activity: Not Currently   Other Topics Concern  . Not on file   Social History Narrative   Lives at home with wife.   1 cup coffee per day.   Right-handed.     Filed Vitals:   10/25/15 0939  BP: 126/70  Pulse: 91  Height: 5\' 9"  (1.753 m)  Weight: 178 lb (80.74 kg)  SpO2: 96%    PHYSICAL EXAM General: NAD HEENT: Normal. Neck: No  JVD, no thyromegaly. Lungs: Clear to auscultation bilaterally with normal respiratory effort. CV: Nondisplaced PMI. Regular rate and rhythm, normal S1/diminished S2, no S3/S4, harsh III/VI late-peaking, ejection systolic murmur. No pretibial or periankle edema. No carotid bruit.  Abdomen: Soft, nontender, no distention.  Neurologic: Alert and oriented x 3.  Psych: Normal affect. Skin: Normal. Musculoskeletal: No gross deformities. Extremities: No clubbing or cyanosis.   ECG: Most recent ECG reviewed.      ASSESSMENT AND PLAN: 1. Exertional dyspnea in context of CAD s/p CABG: Will obtain Lexiscan Cardiolite stress test to evaluate for ischemia. Continue aspirin and Lipitor.  2. Aortic stenosis with shortness of breath: At least moderate in 11/2014. S2 diminished. Will repeat echocardiogram to assess for interval progression. Continue Lasix 20 mg prn for ankle swelling and orthopnea.  3. Syncope with implantable loop recorder: Interrogation from 08/2015 noted above. No recurrent episodes. Continue to monitor. Followed by EP.  4. S/p left CEA in 2009: Dopplers from 06/2015 noted above. Continue ASA and statin therapy. Followed by vascular surgery.  5. Hyperlipidemia: On Lipitor 20 mg.  Dispo: f/u 2 weeks.  Kate Sable, M.D., F.A.C.C.

## 2015-10-26 ENCOUNTER — Ambulatory Visit: Payer: Self-pay | Admitting: Adult Health

## 2015-10-29 LAB — CUP PACEART REMOTE DEVICE CHECK: Date Time Interrogation Session: 20170302200733

## 2015-10-29 NOTE — Progress Notes (Signed)
Carelink summary report received. Battery status OK. Normal device function. No new symptom episodes, tachy episodes, brady, or pause episodes. No new AF episodes. Monthly summary reports and ROV/PRN 

## 2015-11-02 ENCOUNTER — Encounter (HOSPITAL_COMMUNITY)
Admission: RE | Admit: 2015-11-02 | Discharge: 2015-11-02 | Disposition: A | Discharge status: Home / Self Care | Payer: Medicare HMO | Source: Ambulatory Visit | Attending: Cardiovascular Disease | Admitting: Cardiovascular Disease

## 2015-11-02 ENCOUNTER — Encounter (HOSPITAL_COMMUNITY): Payer: Self-pay

## 2015-11-02 ENCOUNTER — Inpatient Hospital Stay (HOSPITAL_COMMUNITY): Admission: RE | Admit: 2015-11-02 | Payer: Self-pay | Source: Ambulatory Visit

## 2015-11-02 ENCOUNTER — Ambulatory Visit (HOSPITAL_COMMUNITY)
Admission: RE | Admit: 2015-11-02 | Discharge: 2015-11-02 | Disposition: A | Discharge status: Home / Self Care | Payer: Medicare HMO | Source: Ambulatory Visit | Attending: Cardiovascular Disease | Admitting: Cardiovascular Disease

## 2015-11-02 DIAGNOSIS — I700 Atherosclerosis of aorta: Secondary | ICD-10-CM | POA: Insufficient documentation

## 2015-11-02 DIAGNOSIS — R0609 Other forms of dyspnea: Secondary | ICD-10-CM | POA: Diagnosis not present

## 2015-11-02 DIAGNOSIS — R0600 Dyspnea, unspecified: Secondary | ICD-10-CM

## 2015-11-02 DIAGNOSIS — Z951 Presence of aortocoronary bypass graft: Secondary | ICD-10-CM | POA: Diagnosis not present

## 2015-11-02 DIAGNOSIS — I350 Nonrheumatic aortic (valve) stenosis: Secondary | ICD-10-CM | POA: Diagnosis not present

## 2015-11-02 LAB — NM MYOCAR MULTI W/SPECT W/WALL MOTION / EF
CHL CUP NUCLEAR SRS: 9
CHL CUP NUCLEAR SSS: 11
LV sys vol: 93 mL
LVDIAVOL: 131 mL (ref 62–150)
NUC STRESS TID: 1.05
Peak HR: 102 {beats}/min
RATE: 0.15
Rest HR: 82 {beats}/min
SDS: 2

## 2015-11-02 MED ORDER — SODIUM CHLORIDE 0.9% FLUSH
INTRAVENOUS | Status: AC
  Administered 2015-11-02: 10 mL via INTRAVENOUS
  Filled 2015-11-02: qty 10

## 2015-11-02 MED ORDER — TECHNETIUM TC 99M SESTAMIBI GENERIC - CARDIOLITE
30.0000 | Freq: Once | INTRAVENOUS | Status: AC | PRN
  Administered 2015-11-02: 30.2 via INTRAVENOUS

## 2015-11-02 MED ORDER — TECHNETIUM TC 99M SESTAMIBI - CARDIOLITE
10.0000 | Freq: Once | INTRAVENOUS | Status: AC | PRN
  Administered 2015-11-02: 10:00:00 10 via INTRAVENOUS

## 2015-11-02 MED ORDER — REGADENOSON 0.4 MG/5ML IV SOLN
INTRAVENOUS | Status: AC
  Administered 2015-11-02: 0.4 mg via INTRAVENOUS
  Filled 2015-11-02: qty 5

## 2015-11-03 ENCOUNTER — Telehealth: Payer: Self-pay | Admitting: *Deleted

## 2015-11-03 LAB — ECHOCARDIOGRAM COMPLETE
AOPV: 0.28 m/s
AORTIC ROOT 2D: 33 mm
AV Mean grad: 19 mmHg
AV Peak grad: 40 mmHg
AV vel: 1.49
AVA: 1.49 cm2
CHL CUP AV PEAK INDEX: 0.65
CHL CUP AV VALUE AREA INDEX: 0.76
CHL CUP DOP CALC LVOT VTI: 23.8 cm
CHL CUP LVOT MEAN VEL: 64.1 cm/s
CHL CUP MV DEC (S): 211 ms
CHL CUP STROKE VOLUME: 61 mL
CHL CUP SV INDEX: 31 mL/m2
DOP CAL AO MEAN VELOCITY: 200 cm/s
E decel time: 211 msec
E/e' ratio: 15.58
FS: 31 % (ref 28–44)
IV/PV OW: 0.98
LA SIZE INDEX: 2.59 mm/m2
LA VOL 2D INDEX: 32.3 mL/m2
LA VOL 2D: 63.7 mL
LA diam end sys: 51 mm
LA vol index: 35.7 mL/m2
LASIZE: 51 mm
LAVOL: 70.4 mL
LDCA: 4.52 cm2
LV PW s: 12.2 mm
LV SIMPSON'S DISK: 57
LV TDI E'MEDIAL: 4.46 cm/s
LV dias vol index: 55 mL/m2
LV dias vol: 108 mL (ref 62–150)
LV sys vol index: 24 mL/m2
LV sys vol: 108 mL — AB (ref 21–61)
LVIDD: 53.9 mm — AB (ref 3.5–6.0)
LVIDS: 37.4 mm — AB (ref 2.1–4.0)
LVOT SV INDEX: 55 mL/m2
LVOT SV: 108 mL
LVOT peak grad rest: 3 mmHg
LVOT peak vel: 90.2 cm/s
LVOTD: 24 mm
LVOTVTI: 0.33 cm
MV Peak grad: 6 mmHg
MVPKAVEL: 77.6 cm/s
MVPKEVEL: 122 cm/s
PISA EROA: 0.1 cm2
PW: 12.2 mm — AB (ref 0.6–1.1)
Single Plane A4C EF: 57 %
TAPSE: 16.5 mm
TDI e' lateral: 7.83 cm/s
TRMAXVEL: 174 cm/s
TV PEAK RV-RA GRADIENT: 12 cm/s
VTI: 144 cm
VTI: 72.1 cm

## 2015-11-03 NOTE — Telephone Encounter (Signed)
-----   Message from Arnoldo Lenis, MD sent at 11/03/2015 11:04 AM EDT ----- Stress test shows possible small areas of new blockages in his heart, this is something that he and Dr Raliegh Ip will need to discuss at his follow up next week whether this is something just to treat with medications or if a cath will be neccesary.   Zandra Abts MD

## 2015-11-03 NOTE — Telephone Encounter (Signed)
Called patient with test results. No answer. Left message to call back.  

## 2015-11-05 DIAGNOSIS — L231 Allergic contact dermatitis due to adhesives: Secondary | ICD-10-CM | POA: Diagnosis not present

## 2015-11-05 DIAGNOSIS — L853 Xerosis cutis: Secondary | ICD-10-CM | POA: Diagnosis not present

## 2015-11-05 DIAGNOSIS — L299 Pruritus, unspecified: Secondary | ICD-10-CM | POA: Diagnosis not present

## 2015-11-08 ENCOUNTER — Encounter: Payer: Self-pay | Admitting: Cardiovascular Disease

## 2015-11-08 ENCOUNTER — Ambulatory Visit (INDEPENDENT_AMBULATORY_CARE_PROVIDER_SITE_OTHER): Payer: Medicare HMO | Admitting: Cardiovascular Disease

## 2015-11-08 VITALS — BP 130/70 | HR 97 | Ht 69.0 in | Wt 180.0 lb

## 2015-11-08 DIAGNOSIS — R0609 Other forms of dyspnea: Secondary | ICD-10-CM | POA: Diagnosis not present

## 2015-11-08 DIAGNOSIS — I10 Essential (primary) hypertension: Secondary | ICD-10-CM

## 2015-11-08 DIAGNOSIS — I779 Disorder of arteries and arterioles, unspecified: Secondary | ICD-10-CM

## 2015-11-08 DIAGNOSIS — I739 Peripheral vascular disease, unspecified: Secondary | ICD-10-CM

## 2015-11-08 DIAGNOSIS — E785 Hyperlipidemia, unspecified: Secondary | ICD-10-CM

## 2015-11-08 DIAGNOSIS — Z951 Presence of aortocoronary bypass graft: Secondary | ICD-10-CM | POA: Diagnosis not present

## 2015-11-08 DIAGNOSIS — I350 Nonrheumatic aortic (valve) stenosis: Secondary | ICD-10-CM

## 2015-11-08 DIAGNOSIS — R55 Syncope and collapse: Secondary | ICD-10-CM

## 2015-11-08 DIAGNOSIS — R0600 Dyspnea, unspecified: Secondary | ICD-10-CM

## 2015-11-08 DIAGNOSIS — I25768 Atherosclerosis of bypass graft of coronary artery of transplanted heart with other forms of angina pectoris: Secondary | ICD-10-CM

## 2015-11-08 MED ORDER — METOPROLOL TARTRATE 25 MG PO TABS: 12.5000 mg | ORAL_TABLET | Freq: Every day | ORAL | Status: DC

## 2015-11-08 NOTE — Progress Notes (Signed)
Patient ID: [name], male   DOB: May 27, 1931, 82 y.o.   MRN: [id number]      SUBJECTIVE: The patient returns for follow-up after undergoing cardiovascular testing performed for the evaluation of exertional dyspnea in the context of prior CABG and aortic stenosis.  Nuclear stress test showed a small apical inferolateral defect suggestive of moderate peri-infarct ischemia. There was also a small region of inferoseptal ischemia.  Echocardiogram showed mildly reduced left ventricular systolic function, EF Q000111Q with grade 2 diastolic dysfunction, ischemic wall motion abnormalities , mild-to-moderate mitral regurgitation, and moderate to severe calcific aortic stenosis.  He denies chest pain. He has had 2 episodes of shortness of breath but denies paroxysmal nocturnal dyspnea. He also denies syncope.  Review of Systems: As per "subjective", otherwise negative.  Allergies  Allergen Reactions  . Propylene Glycol Hives and Rash    Current Outpatient Prescriptions  Medication Sig Dispense Refill  . allopurinol (ZYLOPRIM) 300 MG tablet Take 600 mg by mouth daily.     Marland Kitchen aspirin EC 81 MG tablet Take 81 mg by mouth daily.    Marland Kitchen atorvastatin (LIPITOR) 20 MG tablet Take 20 mg by mouth every evening.     . fluticasone (FLONASE) 50 MCG/ACT nasal spray Place 1 spray into both nostrils 2 (two) times daily.    . furosemide (LASIX) 20 MG tablet Take 10 mg by mouth daily as needed for fluid or edema. Reported on 10/25/2015    . latanoprost (XALATAN) 0.005 % ophthalmic solution Place 1 drop into both eyes at bedtime.    Marland Kitchen levothyroxine (SYNTHROID, LEVOTHROID) 112 MCG tablet Take 112 mcg by mouth daily before breakfast.    . multivitamin (THERAGRAN) per tablet Take 1 tablet by mouth daily.     . nitroGLYCERIN (NITROSTAT) 0.4 MG SL tablet Place 0.4 mg under the tongue every 5 (five) minutes x 3 doses as needed. For chest pain    . omeprazole (PRILOSEC) 20 MG capsule Take 20 mg by mouth daily.     Marland Kitchen  pyridOXINE (VITAMIN B-6) 100 MG tablet Take 100 mg by mouth daily.     . Tamsulosin HCl (FLOMAX) 0.4 MG CAPS Take 0.4 mg by mouth daily.     . valsartan (DIOVAN) 320 MG tablet Take 320 mg by mouth daily.      No current facility-administered medications for this visit.    Past Medical History  Diagnosis Date  . Hyperlipidemia   . Benign prostatic hypertrophy   . Carotid artery disease ([place])     DES, SVG to circumflex marginal, October, 2010 ( SVG to PDA and PLA patent , LIMA to LAD patent, EF 60%, mild inferior hypokinesis; H/o mild CHF, CABG  . CAD (coronary artery disease)     DES, SVG to circumflex marginal, October, 2010 ( SVG to PDA and PLA patent , LIMA to LAD patent, EF 60%, mild inferior hypokinesis  . Syncope     Remote, etiology unknown  . Psoriasis     severe; with total skin exfoliation in the past resolved  . Hx of decompressive lumbar laminectomy     Dr.Botero December, 2011  . Aortic stenosis     Mild, echo, August, 2012  . HTN (hypertension)     takes meds daily  . Hypothyroidism   . Nephrolithiasis   . GERD (gastroesophageal reflux disease)     takes daily  . Arthritis   . Neuromuscular disorder ([place])   . PVC's (premature ventricular contractions)     May, 2014  .  Ventricular tachycardia, non-sustained [facility])     Past Surgical History  Procedure Laterality Date  . Coronary artery bypass graft  1995  . Decompression of left median nerve    . Left carotid endarectomy and dacron patch angioplasty]    . Bilateral l3-4 laminectomies    . Removal of synovial cyst    . Posterior arthrodesis with autograft and allograft    . Colonoscopy  11/23/2011    Procedure: COLONOSCOPY;  Surgeon: Rogene Houston, MD;  [facility];  Service: Endoscopy;  Laterality: N/A;  730  . Back surgery      x3  . Tonsillectomy    . Appendectomy    . Total knee arthroplasty  04/16/2012    Procedure: TOTAL KNEE ARTHROPLASTY;  Surgeon: Garald Balding, MD;  Location: [place];   Service: Orthopedics;  Laterality: Left;  . Cataract extraction, bilateral    . Joint replacement Left Sept. 27, 2013    Knee  . Spine surgery      X's 3  . Carotid endarterectomy Left 02-24-08    cea  Dr. Amedeo Plenty  . Carelink remote monitoring  Oct. 29, 2015    by Dr. [name]  . Carpal tunnel release Bilateral 2010  . Left heart catheterization with coronary angiogram N/A 12/27/2012    Procedure: LEFT HEART CATHETERIZATION WITH CORONARY ANGIOGRAM;  Surgeon: Burnell Blanks, MD;  Location: [facility] CATH LAB;  Service: Cardiovascular;  Laterality: N/A;    Social History   Social History  . Marital Status: Married    Spouse Name: N/A  . Number of Children: 2  . Years of Education: 16   Occupational History  . Retired    Social History Main Topics  . Smoking status: Never Smoker   . Smokeless tobacco: Never Used  . Alcohol Use: No     Comment: One beer per week.  . Drug Use: No  . Sexual Activity: Not Currently   Other Topics Concern  . Not on file   Social History Narrative   Lives at home with wife.   1 cup coffee per day.   Right-handed.     Filed Vitals:   11/08/15 0838  BP: 130/70  Pulse: 97  Height: 5\' 9"  (1.753 m)  Weight: 180 lb (81.647 kg)  SpO2: 90%    PHYSICAL EXAM General: NAD HEENT: Normal. Neck: No JVD, no thyromegaly. Lungs: Clear to auscultation bilaterally with normal respiratory effort. CV: Nondisplaced PMI. Regular rate and rhythm, normal S1/diminished S2, no S3/S4, harsh III/VI late-peaking, ejection systolic murmur. No pretibial or periankle edema.   Abdomen: Soft, nontender, no distention.  Neurologic: Alert and oriented x 3.  Psych: Normal affect. Skin: Normal. Musculoskeletal: No gross deformities. Extremities: No clubbing or cyanosis.   ECG: Most recent ECG reviewed.      ASSESSMENT AND PLAN: 1. Exertional dyspnea in context of CAD s/p CABG: Lexiscan Cardiolite stress test results reviewed above with peri-infarct  ischemia demonstrated. Continue aspirin and Lipitor. Will manage medically for now but the possibility of coronary angiography was discussed. Will start metoprolol 12.5 mg every morning.  2. Moderate to severe aortic stenosis: Discussed eventual need for valve replacement, likely via the transcatheter approach. Continue Lasix 20 mg prn for ankle swelling and orthopnea.  3. Syncope with implantable loop recorder: Interrogation from 08/2015 previously reviewed. No recurrent episodes. Continue to monitor. Followed by EP.  4. S/p left CEA in 2009: Dopplers from 06/2015 previously reviewed. Continue ASA and statin therapy. Followed  by vascular surgery.  5. Hyperlipidemia: On Lipitor 20 mg.  Dispo: f/u 1 month.  Kate Sable, M.D., F.A.C.C.

## 2015-11-08 NOTE — Patient Instructions (Addendum)
Your physician has recommended you make the following change in your medication:  Start metoprolol tartrate 12.5 mg in the morning. Continue all other medications the same. Your physician recommends that you schedule a follow-up appointment in: 1 month. Your physician has approved you to exercise.

## 2015-11-15 ENCOUNTER — Telehealth: Payer: Self-pay | Admitting: Cardiovascular Disease

## 2015-11-15 NOTE — Telephone Encounter (Signed)
Can arrange for left heart catheterization (and right heart catheterization given aortic stenosis) and coronary angiography.

## 2015-11-15 NOTE — Telephone Encounter (Signed)
SOB with any activity that he does.  Has taken medication now for about a week & does not feel any different.  If anything, feels worse.  No c/o chest pain.  Wants to go ahead with cath as soon as possible.  Message fwd to provider.

## 2015-11-16 ENCOUNTER — Encounter: Payer: Self-pay | Admitting: *Deleted

## 2015-11-16 ENCOUNTER — Telehealth: Payer: Self-pay | Admitting: Cardiovascular Disease

## 2015-11-16 ENCOUNTER — Other Ambulatory Visit: Payer: Self-pay | Admitting: Cardiovascular Disease

## 2015-11-16 DIAGNOSIS — R0609 Other forms of dyspnea: Principal | ICD-10-CM

## 2015-11-16 DIAGNOSIS — R9439 Abnormal result of other cardiovascular function study: Secondary | ICD-10-CM

## 2015-11-16 DIAGNOSIS — R0600 Dyspnea, unspecified: Secondary | ICD-10-CM

## 2015-11-16 DIAGNOSIS — Z951 Presence of aortocoronary bypass graft: Secondary | ICD-10-CM

## 2015-11-16 DIAGNOSIS — I350 Nonrheumatic aortic (valve) stenosis: Secondary | ICD-10-CM

## 2015-11-16 NOTE — Telephone Encounter (Signed)
Left & right heart cath scheduled for tomorrow, 11/17/2015 at 10:00 with Dr. Martinique.

## 2015-11-16 NOTE — Telephone Encounter (Signed)
Patient notified verbalized understanding

## 2015-11-16 NOTE — Telephone Encounter (Signed)
Left & right heart cath scheduled tomorrow, 3/29 at 10:00 with  Dr. Martinique. Checking percert

## 2015-11-16 NOTE — Telephone Encounter (Signed)
Left message to return call 

## 2015-11-17 ENCOUNTER — Encounter (HOSPITAL_COMMUNITY)
Admission: RE | Disposition: A | Discharge status: Home / Self Care | Payer: Self-pay | Source: Ambulatory Visit | Attending: Cardiology

## 2015-11-17 ENCOUNTER — Ambulatory Visit (HOSPITAL_COMMUNITY)
Admission: RE | Admit: 2015-11-17 | Discharge: 2015-11-18 | Disposition: A | Discharge status: Home / Self Care | Payer: Medicare HMO | Source: Ambulatory Visit | Attending: Cardiology | Admitting: Cardiology

## 2015-11-17 ENCOUNTER — Encounter (HOSPITAL_COMMUNITY): Payer: Self-pay | Admitting: Cardiology

## 2015-11-17 DIAGNOSIS — N400 Enlarged prostate without lower urinary tract symptoms: Secondary | ICD-10-CM | POA: Diagnosis not present

## 2015-11-17 DIAGNOSIS — I25718 Atherosclerosis of autologous vein coronary artery bypass graft(s) with other forms of angina pectoris: Secondary | ICD-10-CM | POA: Insufficient documentation

## 2015-11-17 DIAGNOSIS — R0600 Dyspnea, unspecified: Secondary | ICD-10-CM | POA: Diagnosis present

## 2015-11-17 DIAGNOSIS — Z951 Presence of aortocoronary bypass graft: Secondary | ICD-10-CM

## 2015-11-17 DIAGNOSIS — I25810 Atherosclerosis of coronary artery bypass graft(s) without angina pectoris: Secondary | ICD-10-CM | POA: Diagnosis present

## 2015-11-17 DIAGNOSIS — I2582 Chronic total occlusion of coronary artery: Secondary | ICD-10-CM | POA: Insufficient documentation

## 2015-11-17 DIAGNOSIS — Z7982 Long term (current) use of aspirin: Secondary | ICD-10-CM | POA: Insufficient documentation

## 2015-11-17 DIAGNOSIS — I25719 Atherosclerosis of autologous vein coronary artery bypass graft(s) with unspecified angina pectoris: Secondary | ICD-10-CM | POA: Diagnosis present

## 2015-11-17 DIAGNOSIS — R0609 Other forms of dyspnea: Secondary | ICD-10-CM

## 2015-11-17 DIAGNOSIS — E039 Hypothyroidism, unspecified: Secondary | ICD-10-CM | POA: Diagnosis not present

## 2015-11-17 DIAGNOSIS — Z955 Presence of coronary angioplasty implant and graft: Secondary | ICD-10-CM

## 2015-11-17 DIAGNOSIS — I25708 Atherosclerosis of coronary artery bypass graft(s), unspecified, with other forms of angina pectoris: Secondary | ICD-10-CM | POA: Diagnosis not present

## 2015-11-17 DIAGNOSIS — Z79899 Other long term (current) drug therapy: Secondary | ICD-10-CM | POA: Insufficient documentation

## 2015-11-17 DIAGNOSIS — I25118 Atherosclerotic heart disease of native coronary artery with other forms of angina pectoris: Secondary | ICD-10-CM | POA: Diagnosis not present

## 2015-11-17 DIAGNOSIS — Z96652 Presence of left artificial knee joint: Secondary | ICD-10-CM | POA: Insufficient documentation

## 2015-11-17 DIAGNOSIS — I272 Other secondary pulmonary hypertension: Secondary | ICD-10-CM | POA: Insufficient documentation

## 2015-11-17 DIAGNOSIS — R55 Syncope and collapse: Secondary | ICD-10-CM | POA: Diagnosis not present

## 2015-11-17 DIAGNOSIS — I10 Essential (primary) hypertension: Secondary | ICD-10-CM | POA: Diagnosis not present

## 2015-11-17 DIAGNOSIS — I350 Nonrheumatic aortic (valve) stenosis: Secondary | ICD-10-CM | POA: Diagnosis not present

## 2015-11-17 DIAGNOSIS — E785 Hyperlipidemia, unspecified: Secondary | ICD-10-CM | POA: Diagnosis present

## 2015-11-17 DIAGNOSIS — I2510 Atherosclerotic heart disease of native coronary artery without angina pectoris: Secondary | ICD-10-CM | POA: Diagnosis present

## 2015-11-17 DIAGNOSIS — R9439 Abnormal result of other cardiovascular function study: Secondary | ICD-10-CM | POA: Insufficient documentation

## 2015-11-17 DIAGNOSIS — I25709 Atherosclerosis of coronary artery bypass graft(s), unspecified, with unspecified angina pectoris: Secondary | ICD-10-CM

## 2015-11-17 HISTORY — PX: CARDIAC CATHETERIZATION: SHX172

## 2015-11-17 HISTORY — PX: CORONARY ANGIOPLASTY WITH STENT PLACEMENT: SHX49

## 2015-11-17 LAB — BASIC METABOLIC PANEL
Anion gap: 10 (ref 5–15)
BUN: 13 mg/dL (ref 6–20)
CALCIUM: 9.5 mg/dL (ref 8.9–10.3)
CHLORIDE: 102 mmol/L (ref 101–111)
CO2: 27 mmol/L (ref 22–32)
CREATININE: 0.92 mg/dL (ref 0.61–1.24)
GFR calc non Af Amer: >60 mL/min (ref >60)
GLUCOSE: 113 mg/dL — AB (ref 65–99)
Potassium: 3.9 mmol/L (ref 3.5–5.1)
Sodium: 139 mmol/L (ref 135–145)

## 2015-11-17 LAB — POCT I-STAT 3, VENOUS BLOOD GAS (G3P V)
Acid-Base Excess: 1 mmol/L (ref 0.0–2.0)
BICARBONATE: 26.6 meq/L — AB (ref 20.0–24.0)
O2 SAT: 70 %
PCO2 VEN: 44.2 mmHg — AB (ref 45.0–50.0)
TCO2: 28 mmol/L (ref 0–100)
pH, Ven: 7.387 — ABNORMAL HIGH (ref 7.250–7.300)
pO2, Ven: 37 mmHg (ref 31.0–45.0)

## 2015-11-17 LAB — CBC
HCT: 31.7 % — ABNORMAL LOW (ref 39.0–52.0)
Hemoglobin: 10.7 g/dL — ABNORMAL LOW (ref 13.0–17.0)
MCH: 32.6 pg (ref 26.0–34.0)
MCHC: 33.8 g/dL (ref 30.0–36.0)
MCV: 96.6 fL (ref 78.0–100.0)
PLATELETS: 171 10*3/uL (ref 150–400)
RBC: 3.28 MIL/uL — ABNORMAL LOW (ref 4.22–5.81)
RDW: 13.9 % (ref 11.5–15.5)
WBC: 8.1 10*3/uL (ref 4.0–10.5)

## 2015-11-17 LAB — POCT I-STAT 3, ART BLOOD GAS (G3+)
Acid-Base Excess: 2 mmol/L (ref 0.0–2.0)
BICARBONATE: 26.9 meq/L — AB (ref 20.0–24.0)
O2 SAT: 91 %
PCO2 ART: 44.1 mmHg (ref 35.0–45.0)
PH ART: 7.394 (ref 7.350–7.450)
TCO2: 28 mmol/L (ref 0–100)
pO2, Arterial: 62 mmHg — ABNORMAL LOW (ref 80.0–100.0)

## 2015-11-17 LAB — PROTIME-INR
INR: 1.15 (ref 0.00–1.49)
PROTHROMBIN TIME: 14.9 s (ref 11.6–15.2)

## 2015-11-17 LAB — POCT ACTIVATED CLOTTING TIME
Activated Clotting Time: 410 seconds
Activated Clotting Time: 209 seconds

## 2015-11-17 SURGERY — RIGHT/LEFT HEART CATH AND CORONARY ANGIOGRAPHY
Anesthesia: LOCAL

## 2015-11-17 MED ORDER — TAMSULOSIN HCL 0.4 MG PO CAPS: 0.4000 mg | ORAL_CAPSULE | Freq: Every day | ORAL | Status: DC

## 2015-11-17 MED ORDER — SODIUM CHLORIDE 0.9 % IV SOLN
250.0000 mg | INTRAVENOUS | Status: DC | PRN
  Administered 2015-11-17: 1.75 mg/kg/h via INTRAVENOUS

## 2015-11-17 MED ORDER — FUROSEMIDE 40 MG PO TABS
40.0000 mg | ORAL_TABLET | Freq: Every day | ORAL | Status: DC
  Filled 2015-11-17: qty 1

## 2015-11-17 MED ORDER — NITROGLYCERIN 0.4 MG SL SUBL: 0.4000 mg | SUBLINGUAL_TABLET | SUBLINGUAL | Status: DC | PRN

## 2015-11-17 MED ORDER — ASPIRIN EC 81 MG PO TBEC: 81.0000 mg | DELAYED_RELEASE_TABLET | Freq: Every day | ORAL | Status: DC

## 2015-11-17 MED ORDER — LEVOTHYROXINE SODIUM 112 MCG PO TABS
112.0000 ug | ORAL_TABLET | Freq: Every day | ORAL | Status: DC
  Administered 2015-11-18: 06:00:00 112 ug via ORAL
  Filled 2015-11-17: qty 1

## 2015-11-17 MED ORDER — ACETAMINOPHEN 325 MG PO TABS: 650.0000 mg | ORAL_TABLET | ORAL | Status: DC | PRN

## 2015-11-17 MED ORDER — VERAPAMIL HCL 2.5 MG/ML IV SOLN
INTRAVENOUS | Status: DC | PRN
  Administered 2015-11-17: 11:00:00 via INTRA_ARTERIAL

## 2015-11-17 MED ORDER — ADULT MULTIVITAMIN W/MINERALS CH: 1.0000 | ORAL_TABLET | Freq: Every day | ORAL | Status: DC

## 2015-11-17 MED ORDER — SODIUM CHLORIDE 0.9% FLUSH
3.0000 mL | Freq: Two times a day (BID) | INTRAVENOUS | Status: DC
  Administered 2015-11-17: 3 mL via INTRAVENOUS

## 2015-11-17 MED ORDER — ATORVASTATIN CALCIUM 20 MG PO TABS
20.0000 mg | ORAL_TABLET | Freq: Every evening | ORAL | Status: DC
  Administered 2015-11-17: 20 mg via ORAL
  Filled 2015-11-17: qty 1

## 2015-11-17 MED ORDER — VERAPAMIL HCL 2.5 MG/ML IV SOLN
INTRAVENOUS | Status: AC
  Filled 2015-11-17: qty 2

## 2015-11-17 MED ORDER — FENTANYL CITRATE (PF) 100 MCG/2ML IJ SOLN
INTRAMUSCULAR | Status: DC | PRN
Start: 2015-11-17 — End: 2015-11-17
  Administered 2015-11-17: 25 ug via INTRAVENOUS

## 2015-11-17 MED ORDER — IOPAMIDOL (ISOVUE-370) INJECTION 76%
INTRAVENOUS | Status: AC
  Filled 2015-11-17: qty 100

## 2015-11-17 MED ORDER — LIDOCAINE HCL (PF) 1 % IJ SOLN
INTRAMUSCULAR | Status: DC | PRN
  Administered 2015-11-17 (×2): 2 mL

## 2015-11-17 MED ORDER — SODIUM CHLORIDE 0.9 % WEIGHT BASED INFUSION
3.0000 mL/kg/h | INTRAVENOUS | Status: DC
Start: 2015-11-17 — End: 2015-11-17
  Administered 2015-11-17: 3 mL/kg/h via INTRAVENOUS

## 2015-11-17 MED ORDER — SODIUM CHLORIDE 0.9 % IV SOLN: 250.0000 mL | INTRAVENOUS | Status: DC | PRN

## 2015-11-17 MED ORDER — IOPAMIDOL (ISOVUE-370) INJECTION 76%
INTRAVENOUS | Status: DC | PRN
  Administered 2015-11-17: 130 mL via INTRAVENOUS

## 2015-11-17 MED ORDER — CLOPIDOGREL BISULFATE 75 MG PO TABS
75.0000 mg | ORAL_TABLET | Freq: Every day | ORAL | Status: DC
  Administered 2015-11-18: 10:00:00 75 mg via ORAL

## 2015-11-17 MED ORDER — SODIUM CHLORIDE 0.9% FLUSH: 3.0000 mL | Freq: Two times a day (BID) | INTRAVENOUS | Status: DC

## 2015-11-17 MED ORDER — FENTANYL CITRATE (PF) 100 MCG/2ML IJ SOLN
INTRAMUSCULAR | Status: AC
  Filled 2015-11-17: qty 2

## 2015-11-17 MED ORDER — ASPIRIN 81 MG PO CHEW: 81.0000 mg | CHEWABLE_TABLET | ORAL | Status: DC

## 2015-11-17 MED ORDER — SODIUM CHLORIDE 0.9% FLUSH: 3.0000 mL | INTRAVENOUS | Status: DC | PRN

## 2015-11-17 MED ORDER — HEPARIN (PORCINE) IN NACL 2-0.9 UNIT/ML-% IJ SOLN
INTRAMUSCULAR | Status: AC
  Filled 2015-11-17: qty 1000

## 2015-11-17 MED ORDER — PANTOPRAZOLE SODIUM 40 MG PO TBEC: 40.0000 mg | DELAYED_RELEASE_TABLET | Freq: Every day | ORAL | Status: DC

## 2015-11-17 MED ORDER — CLOPIDOGREL BISULFATE 300 MG PO TABS
ORAL_TABLET | ORAL | Status: AC
  Filled 2015-11-17: qty 2

## 2015-11-17 MED ORDER — LIDOCAINE HCL (PF) 1 % IJ SOLN
INTRAMUSCULAR | Status: AC
  Filled 2015-11-17: qty 30

## 2015-11-17 MED ORDER — HEPARIN (PORCINE) IN NACL 2-0.9 UNIT/ML-% IJ SOLN
INTRAMUSCULAR | Status: DC | PRN
  Administered 2015-11-17: 1000 mL

## 2015-11-17 MED ORDER — ONDANSETRON HCL 4 MG/2ML IJ SOLN: 4.0000 mg | Freq: Four times a day (QID) | INTRAMUSCULAR | Status: DC | PRN

## 2015-11-17 MED ORDER — METOPROLOL TARTRATE 12.5 MG HALF TABLET: 12.5000 mg | ORAL_TABLET | Freq: Every day | ORAL | Status: DC

## 2015-11-17 MED ORDER — LATANOPROST 0.005 % OP SOLN
1.0000 [drp] | Freq: Every day | OPHTHALMIC | Status: DC
  Filled 2015-11-17: qty 2.5

## 2015-11-17 MED ORDER — IRBESARTAN 300 MG PO TABS: 300.0000 mg | ORAL_TABLET | Freq: Every day | ORAL | Status: DC

## 2015-11-17 MED ORDER — SODIUM CHLORIDE 0.9 % WEIGHT BASED INFUSION
1.0000 mL/kg/h | INTRAVENOUS | Status: DC
  Administered 2015-11-17: 1 mL/kg/h via INTRAVENOUS

## 2015-11-17 MED ORDER — HEPARIN SODIUM (PORCINE) 1000 UNIT/ML IJ SOLN
INTRAMUSCULAR | Status: DC | PRN
  Administered 2015-11-17: 4000 [IU] via INTRAVENOUS

## 2015-11-17 MED ORDER — ALLOPURINOL 300 MG PO TABS
600.0000 mg | ORAL_TABLET | Freq: Every day | ORAL | Status: DC
  Filled 2015-11-17 (×2): qty 2

## 2015-11-17 MED ORDER — MIDAZOLAM HCL 2 MG/2ML IJ SOLN
INTRAMUSCULAR | Status: DC | PRN
  Administered 2015-11-17: 1 mg via INTRAVENOUS

## 2015-11-17 MED ORDER — BIVALIRUDIN BOLUS VIA INFUSION - CUPID
INTRAVENOUS | Status: DC | PRN
  Administered 2015-11-17: 61.2 mg via INTRAVENOUS

## 2015-11-17 MED ORDER — VITAMIN B-6 100 MG PO TABS
100.0000 mg | ORAL_TABLET | Freq: Every day | ORAL | Status: DC
  Filled 2015-11-17 (×2): qty 1

## 2015-11-17 MED ORDER — FLUTICASONE PROPIONATE 50 MCG/ACT NA SUSP
1.0000 | Freq: Two times a day (BID) | NASAL | Status: DC
  Administered 2015-11-18: 1 via NASAL
  Filled 2015-11-17: qty 16

## 2015-11-17 MED ORDER — HEPARIN SODIUM (PORCINE) 1000 UNIT/ML IJ SOLN
INTRAMUSCULAR | Status: AC
  Filled 2015-11-17: qty 1

## 2015-11-17 MED ORDER — CLOPIDOGREL BISULFATE 300 MG PO TABS
ORAL_TABLET | ORAL | Status: DC | PRN
  Administered 2015-11-17: 600 mg via ORAL

## 2015-11-17 MED ORDER — SODIUM CHLORIDE 0.9 % WEIGHT BASED INFUSION: 1.0000 mL/kg/h | INTRAVENOUS | Status: AC

## 2015-11-17 MED ORDER — MIDAZOLAM HCL 2 MG/2ML IJ SOLN
INTRAMUSCULAR | Status: AC
  Filled 2015-11-17: qty 2

## 2015-11-17 MED ORDER — BIVALIRUDIN 250 MG IV SOLR
INTRAVENOUS | Status: AC
  Filled 2015-11-17: qty 250

## 2015-11-17 MED ORDER — ANGIOPLASTY BOOK
Freq: Once | Status: AC
  Administered 2015-11-17: 22:00:00
  Filled 2015-11-17: qty 1

## 2015-11-17 SURGICAL SUPPLY — 21 items
BALLN ~~LOC~~ TREK RX 4.5X8 (BALLOONS) ×2
BALLOON ~~LOC~~ TREK RX 4.5X8 (BALLOONS) ×1 IMPLANT
CATH BALLN WEDGE 5F 110CM (CATHETERS) ×2 IMPLANT
CATH INFINITI 5 FR IM (CATHETERS) ×2 IMPLANT
CATH INFINITI 5FR MULTPACK ANG (CATHETERS) ×2 IMPLANT
CATH VISTA GUIDE 6FR JR4 (CATHETERS) ×2 IMPLANT
DEVICE SPIDERFX EMB PROT 4MM (WIRE) ×2 IMPLANT
GLIDESHEATH SLEND SS 6F .021 (SHEATH) ×2 IMPLANT
KIT ENCORE 26 ADVANTAGE (KITS) ×2 IMPLANT
KIT HEART LEFT (KITS) ×2 IMPLANT
KIT HEART RIGHT NAMIC (KITS) ×2 IMPLANT
PACK CARDIAC CATHETERIZATION (CUSTOM PROCEDURE TRAY) ×2 IMPLANT
SHEATH FAST CATH BRACH 5F 5CM (SHEATH) ×2 IMPLANT
STENT PROMUS PREM MR 4.0X12 (Permanent Stent) ×2 IMPLANT
SYR MEDRAD MARK V 150ML (SYRINGE) ×2 IMPLANT
TRANSDUCER W/STOPCOCK (MISCELLANEOUS) ×2 IMPLANT
TUBING CIL FLEX 10 FLL-RA (TUBING) ×2 IMPLANT
WIRE ASAHI PROWATER 180CM (WIRE) ×2 IMPLANT
WIRE EMERALD ST .035X260CM (WIRE) ×2 IMPLANT
WIRE HI TORQ VERSACORE-J 145CM (WIRE) ×2 IMPLANT
WIRE SAFE-T 1.5MM-J .035X260CM (WIRE) ×2 IMPLANT

## 2015-11-17 NOTE — H&P (View-Only) (Signed)
Patient ID: CREEDENCE LANDAVERDE, male   DOB: 1931/08/12, 82 y.o.   MRN: [id number]      SUBJECTIVE: The patient returns for follow-up after undergoing cardiovascular testing performed for the evaluation of exertional dyspnea in the context of prior CABG and aortic stenosis.  Nuclear stress test showed a small apical inferolateral defect suggestive of moderate peri-infarct ischemia. There was also a small region of inferoseptal ischemia.  Echocardiogram showed mildly reduced left ventricular systolic function, EF Q000111Q with grade 2 diastolic dysfunction, ischemic wall motion abnormalities , mild-to-moderate mitral regurgitation, and moderate to severe calcific aortic stenosis.  He denies chest pain. He has had 2 episodes of shortness of breath but denies paroxysmal nocturnal dyspnea. He also denies syncope.  Review of Systems: As per "subjective", otherwise negative.  Allergies  Allergen Reactions  . Propylene Glycol Hives and Rash    Current Outpatient Prescriptions  Medication Sig Dispense Refill  . allopurinol (ZYLOPRIM) 300 MG tablet Take 600 mg by mouth daily.     Marland Kitchen aspirin EC 81 MG tablet Take 81 mg by mouth daily.    Marland Kitchen atorvastatin (LIPITOR) 20 MG tablet Take 20 mg by mouth every evening.     . fluticasone (FLONASE) 50 MCG/ACT nasal spray Place 1 spray into both nostrils 2 (two) times daily.    . furosemide (LASIX) 20 MG tablet Take 10 mg by mouth daily as needed for fluid or edema. Reported on 10/25/2015    . latanoprost (XALATAN) 0.005 % ophthalmic solution Place 1 drop into both eyes at bedtime.    Marland Kitchen levothyroxine (SYNTHROID, LEVOTHROID) 112 MCG tablet Take 112 mcg by mouth daily before breakfast.    . multivitamin (THERAGRAN) per tablet Take 1 tablet by mouth daily.     . nitroGLYCERIN (NITROSTAT) 0.4 MG SL tablet Place 0.4 mg under the tongue every 5 (five) minutes x 3 doses as needed. For chest pain    . omeprazole (PRILOSEC) 20 MG capsule Take 20 mg by mouth daily.     Marland Kitchen  pyridOXINE (VITAMIN B-6) 100 MG tablet Take 100 mg by mouth daily.     . Tamsulosin HCl (FLOMAX) 0.4 MG CAPS Take 0.4 mg by mouth daily.     . valsartan (DIOVAN) 320 MG tablet Take 320 mg by mouth daily.      No current facility-administered medications for this visit.    Past Medical History  Diagnosis Date  . Hyperlipidemia   . Benign prostatic hypertrophy   . Carotid artery disease ([place])     DES, SVG to circumflex marginal, October, 2010 ( SVG to PDA and PLA patent , LIMA to LAD patent, EF 60%, mild inferior hypokinesis; H/o mild CHF, CABG  . CAD (coronary artery disease)     DES, SVG to circumflex marginal, October, 2010 ( SVG to PDA and PLA patent , LIMA to LAD patent, EF 60%, mild inferior hypokinesis  . Syncope     Remote, etiology unknown  . Psoriasis     severe; with total skin exfoliation in the past resolved  . Hx of decompressive lumbar laminectomy     Dr.Botero December, 2011  . Aortic stenosis     Mild, echo, August, 2012  . HTN (hypertension)     takes meds daily  . Hypothyroidism   . Nephrolithiasis   . GERD (gastroesophageal reflux disease)     takes daily  . Arthritis   . Neuromuscular disorder ([place])   . PVC's (premature ventricular contractions)     May, 2014  .  Ventricular tachycardia, non-sustained [facility])     Past Surgical History  Procedure Laterality Date  . Coronary artery bypass graft  1995  . Decompression of left median nerve    . Left carotid endarectomy and dacron patch angioplasty]    . Bilateral l3-4 laminectomies    . Removal of synovial cyst    . Posterior arthrodesis with autograft and allograft    . Colonoscopy  11/23/2011    Procedure: COLONOSCOPY;  Surgeon: Rogene Houston, MD;  [facility];  Service: Endoscopy;  Laterality: N/A;  730  . Back surgery      x3  . Tonsillectomy    . Appendectomy    . Total knee arthroplasty  04/16/2012    Procedure: TOTAL KNEE ARTHROPLASTY;  Surgeon: Garald Balding, MD;  Location: [place];   Service: Orthopedics;  Laterality: Left;  . Cataract extraction, bilateral    . Joint replacement Left Sept. 27, 2013    Knee  . Spine surgery      X's 3  . Carotid endarterectomy Left 02-24-08    cea  Dr. Amedeo Plenty  . Carelink remote monitoring  Oct. 29, 2015    by Dr. [name]  . Carpal tunnel release Bilateral 2010  . Left heart catheterization with coronary angiogram N/A 12/27/2012    Procedure: LEFT HEART CATHETERIZATION WITH CORONARY ANGIOGRAM;  Surgeon: Burnell Blanks, MD;  Location: [facility] CATH LAB;  Service: Cardiovascular;  Laterality: N/A;    Social History   Social History  . Marital Status: Married    Spouse Name: N/A  . Number of Children: 2  . Years of Education: 16   Occupational History  . Retired    Social History Main Topics  . Smoking status: Never Smoker   . Smokeless tobacco: Never Used  . Alcohol Use: No     Comment: One beer per week.  . Drug Use: No  . Sexual Activity: Not Currently   Other Topics Concern  . Not on file   Social History Narrative   Lives at home with wife.   1 cup coffee per day.   Right-handed.     Filed Vitals:   11/08/15 0838  BP: 130/70  Pulse: 97  Height: 5\' 9"  (1.753 m)  Weight: 180 lb (81.647 kg)  SpO2: 90%    PHYSICAL EXAM General: NAD HEENT: Normal. Neck: No JVD, no thyromegaly. Lungs: Clear to auscultation bilaterally with normal respiratory effort. CV: Nondisplaced PMI. Regular rate and rhythm, normal S1/diminished S2, no S3/S4, harsh III/VI late-peaking, ejection systolic murmur. No pretibial or periankle edema.   Abdomen: Soft, nontender, no distention.  Neurologic: Alert and oriented x 3.  Psych: Normal affect. Skin: Normal. Musculoskeletal: No gross deformities. Extremities: No clubbing or cyanosis.   ECG: Most recent ECG reviewed.      ASSESSMENT AND PLAN: 1. Exertional dyspnea in context of CAD s/p CABG: Lexiscan Cardiolite stress test results reviewed above with peri-infarct  ischemia demonstrated. Continue aspirin and Lipitor. Will manage medically for now but the possibility of coronary angiography was discussed. Will start metoprolol 12.5 mg every morning.  2. Moderate to severe aortic stenosis: Discussed eventual need for valve replacement, likely via the transcatheter approach. Continue Lasix 20 mg prn for ankle swelling and orthopnea.  3. Syncope with implantable loop recorder: Interrogation from 08/2015 previously reviewed. No recurrent episodes. Continue to monitor. Followed by EP.  4. S/p left CEA in 2009: Dopplers from 06/2015 previously reviewed. Continue ASA and statin therapy. Followed  by vascular surgery.  5. Hyperlipidemia: On Lipitor 20 mg.  Dispo: f/u 1 month.  Kate Sable, M.D., F.A.C.C.

## 2015-11-17 NOTE — Progress Notes (Signed)
Site area: right brachial venous sheath  Site Prior to Removal:  Level 0  Pressure Applied For 10 MINUTES    Minutes Beginning at 1500  Manual:   Yes.    Patient Status During Pull:  Tolerated well, denies complaints  Post Pull Groin Site:  Level 0  Post Pull Instructions Given:  Yes.    Post Pull Pulses Present:  Yes.    Dressing Applied:  Yes.    Comments:  Pulled by Jeral Pinch RN with observation by this RN.  Instructed pt to call RN for any bleeding, swelling or pain.

## 2015-11-17 NOTE — Progress Notes (Signed)
TR BAND REMOVAL  LOCATION:    left radial  DEFLATED PER PROTOCOL:    Yes.    TIME BAND OFF / DRESSING APPLIED:    1800   SITE UPON ARRIVAL:    Level 0  SITE AFTER BAND REMOVAL:    Level 0  CIRCULATION SENSATION AND MOVEMENT:    Within Normal Limits   Yes.    COMMENTS:   Radial and Brachial site care discussed with pt.  Pt knows to call RN for any bleeding.  Call light in reach.  Family at bedside.  Pt walked to bathroom with standby assist, walked with steady gait, just needed help getting up out of bed.

## 2015-11-17 NOTE — Interval H&P Note (Signed)
History and Physical Interval Note:  11/17/2015 10:23 AM  [name]  has presented today for surgery, with the diagnosis of shortness of breath , aortic stenosis  The various methods of treatment have been discussed with the patient and family. After consideration of risks, benefits and other options for treatment, the patient has consented to  Procedure(s): Right/Left Heart Cath and Coronary Angiography (N/A) as a surgical intervention .  The patient's history has been reviewed, patient examined, no change in status, stable for surgery.  I have reviewed the patient's chart and labs.  Questions were answered to the patient's satisfaction.    Cath Lab Visit (complete for each Cath Lab visit)  Clinical Evaluation Leading to the Procedure:   ACS: No.  Non-ACS:    Anginal Classification: CCS II  Anti-ischemic medical therapy: Minimal Therapy (1 class of medications)  Non-Invasive Test Results: High-risk stress test findings: cardiac mortality >3%/year  Prior CABG: Previous CABG       [name] 11/17/2015 10:24 AM

## 2015-11-18 ENCOUNTER — Encounter (HOSPITAL_COMMUNITY): Payer: Self-pay

## 2015-11-18 DIAGNOSIS — I25708 Atherosclerosis of coronary artery bypass graft(s), unspecified, with other forms of angina pectoris: Secondary | ICD-10-CM | POA: Diagnosis not present

## 2015-11-18 DIAGNOSIS — R0600 Dyspnea, unspecified: Secondary | ICD-10-CM

## 2015-11-18 DIAGNOSIS — I2582 Chronic total occlusion of coronary artery: Secondary | ICD-10-CM | POA: Diagnosis not present

## 2015-11-18 DIAGNOSIS — I25718 Atherosclerosis of autologous vein coronary artery bypass graft(s) with other forms of angina pectoris: Secondary | ICD-10-CM | POA: Diagnosis not present

## 2015-11-18 DIAGNOSIS — R9439 Abnormal result of other cardiovascular function study: Secondary | ICD-10-CM | POA: Diagnosis not present

## 2015-11-18 DIAGNOSIS — I25118 Atherosclerotic heart disease of native coronary artery with other forms of angina pectoris: Secondary | ICD-10-CM | POA: Diagnosis not present

## 2015-11-18 DIAGNOSIS — I350 Nonrheumatic aortic (valve) stenosis: Secondary | ICD-10-CM | POA: Diagnosis not present

## 2015-11-18 LAB — CBC
HCT: 29.3 % — ABNORMAL LOW (ref 39.0–52.0)
HEMOGLOBIN: 9.5 g/dL — AB (ref 13.0–17.0)
MCH: 31.3 pg (ref 26.0–34.0)
MCHC: 32.4 g/dL (ref 30.0–36.0)
MCV: 96.4 fL (ref 78.0–100.0)
PLATELETS: 172 10*3/uL (ref 150–400)
RBC: 3.04 MIL/uL — ABNORMAL LOW (ref 4.22–5.81)
RDW: 13.8 % (ref 11.5–15.5)
WBC: 7.1 10*3/uL (ref 4.0–10.5)

## 2015-11-18 LAB — BASIC METABOLIC PANEL
Anion gap: 6 (ref 5–15)
BUN: 10 mg/dL (ref 6–20)
CALCIUM: 9.1 mg/dL (ref 8.9–10.3)
CO2: 28 mmol/L (ref 22–32)
CREATININE: 0.85 mg/dL (ref 0.61–1.24)
Chloride: 104 mmol/L (ref 101–111)
GFR calc Af Amer: >60 mL/min (ref >60)
GFR calc non Af Amer: >60 mL/min (ref >60)
GLUCOSE: 103 mg/dL — AB (ref 65–99)
Potassium: 3.7 mmol/L (ref 3.5–5.1)
Sodium: 138 mmol/L (ref 135–145)

## 2015-11-18 MED ORDER — FUROSEMIDE 20 MG PO TABS: 20.0000 mg | ORAL_TABLET | Freq: Every day | ORAL | Status: DC

## 2015-11-18 MED ORDER — PANTOPRAZOLE SODIUM 40 MG PO TBEC: 40.0000 mg | DELAYED_RELEASE_TABLET | Freq: Every day | ORAL | Status: DC

## 2015-11-18 MED ORDER — CLOPIDOGREL BISULFATE 75 MG PO TABS: 75.0000 mg | ORAL_TABLET | Freq: Every day | ORAL | Status: DC

## 2015-11-18 NOTE — Discharge Summary (Signed)
Discharge Summary    Patient ID: [name],  MRN: [id number], DOB/AGE: Apr 22, 1931 82 y.o.  Admit date: 11/17/2015 Discharge date: 11/18/2015  Primary Care Provider: [name] Primary Cardiologist: Dr. Bronson Ing  Discharge Diagnoses    Principal Problem:   Dyspnea Active Problems:   Hyperlipidemia   HTN (hypertension)   CAD (coronary artery disease)   Hx of CABG   Aortic stenosis   Abnormal cardiovascular stress test   CAD (coronary artery disease) of bypass graft   Allergies Allergies  Allergen Reactions  . Propylene Glycol Hives and Rash    Diagnostic Studies/Procedures    Cath 11/17/2015  Conclusion     Ost LM lesion, 100% stenosed.  Ost LAD to Prox LAD lesion, 100% stenosed.  Ost Cx to Mid Cx lesion, 100% stenosed.  Ost RCA to Dist RCA lesion, 100% stenosed.  LIMA was injected is normal in caliber, and is anatomically normal.  SVG was injected is normal in caliber.  Dist Graft lesion, 40% stenosed.  SVG was injected is normal in caliber, and is anatomically normal.  There is moderate left ventricular systolic dysfunction.  Origin lesion, 90% stenosed. Post intervention, there is a 0% residual stenosis.  1. Left main and RCA occlusive disease 2. Patent LIMA to the LAD 3. Patent SVG to the OM 4. Patent SVG to the PDA with severe stenosis in the proximal SVG 5. Moderate LV dysfunction with EF 35% 6. Moderate pulmonary HTN with elevated LV filling pressures. 7. Mild to moderate aortic stenosis. Mean gradient 18 mm Hg. AVA 1.8 cm squared. 8. Successful stenting of the SVG to the RCA with DES.  Plan: DAPT for one year. Will start lasix 40 mg po daily for CHF. Optimize CHF therapy. Anticipate DC in am.    _____________   History of Present Illness     The patient returns for follow-up after undergoing cardiovascular testing performed for the evaluation of exertional dyspnea in the context of prior CABG and aortic stenosis.  Nuclear  stress test showed a small apical inferolateral defect suggestive of moderate peri-infarct ischemia. There was also a small region of inferoseptal ischemia.  Echocardiogram showed mildly reduced left ventricular systolic function, EF Q000111Q with grade 2 diastolic dysfunction, ischemic wall motion abnormalities , mild-to-moderate mitral regurgitation, and moderate to severe calcific aortic stenosis.  He denies chest pain. He has had 2 episodes of shortness of breath but denies paroxysmal nocturnal dyspnea. He also denies syncope.  Hospital Course     He underwent planned cardiac catheterization on 11/17/2015 which showed patent LIMA to LAD, patent SVG to OM, patent SVG to PDA with 90% severe stenosis in proximal SVG is was treated with drug-eluting stent, EF was 35%, mild-to-moderate AS with mean gradient 18 mmHg, aortic valve area 1.8 cm consistent with mild to moderate AS. Post procedure, he was placed on 40 mg daily of Lasix.   He was seen on the following morning on 11/18/2015, at which time he denies any significant chest discomfort or shortness of breath. Telemetry did show baseline first degree AV block with occasional Wenkebach. According to the patient, he has history of syncope on high-dose Toprol in the past, however has been tolerating current dose of 12.5 mg twice a day of metoprolol. I will not change this at this time. He is concerned about the high dose Lasix. He appears to be only mildly fluid overloaded on exam with trace ankle edema, lung is clear. He states previously he was on 20 mg  Lasix every other day, I have decreased Lasix from 40 mg daily down to 20 mg daily at this time., His hemoglobin did drop by 1 unit post cath, his left radial cath site appears to be stable without significant bleeding or hematoma. I think the drop in the hemoglobin is likely procedure related, there is no obvious complication on physical exam. He will need 1 week CBC and BMET after discharge. Outpatient  one-month follow-up with Dr. Bronson Ing has been arranged. He has been seen by Dr. Claiborne Billings and deemed stable to be discharged from cardiology perspective. On followup, will need to reassess if need to add spironolactone. I have discontinued his prilosec and changed it to protonix given need for plavix.    _____________  Discharge Vitals Blood pressure 94/45, pulse 82, temperature 98 F (36.7 C), temperature source Oral, resp. rate 20, height 5\' 9"  (1.753 m), weight 175 lb 14.8 oz (79.8 kg), SpO2 97 %.  Filed Weights   11/17/15 0757 11/18/15 0600  Weight: 180 lb (81.647 kg) 175 lb 14.8 oz (79.8 kg)    Labs & Radiologic Studies     CBC  Recent Labs  11/17/15 0819 11/18/15 0602  WBC 8.1 7.1  HGB 10.7* 9.5*  HCT 31.7* 29.3*  MCV 96.6 96.4  PLT 171 Q000111Q   Basic Metabolic Panel  Recent Labs  11/17/15 0819 11/18/15 0602  NA 139 138  K 3.9 3.7  CL 102 104  CO2 27 28  GLUCOSE 113* 103*  BUN 13 10  CREATININE 0.92 0.85  CALCIUM 9.5 9.1    Nm Myocar Multi W/spect W/wall Motion / Ef  11/02/2015   Equivocal ST segment depression in leads III, V5 and V6 without diagnostic change from baseline. Frequent ventricular ectopy noted throughout with no sustained arrhythmias.  Small, moderate intensity, apical inferolateral defect that is reversible, with fixed inferior defect from mid to basal level. This suggests scar with region of moderate peri-infarct ischemia near the apex.  Small, mild intensity, partially reversible basal inferoseptal defect consistent with small region of ischemia.  This is a high risk study based mainly on the calculation of LVEF, although this may be artifactual as noted. The findings are consistent with ischemic heart disease in any event.  Nuclear stress EF: 29% in the setting of frequent ventricular ectopy, suspect problems with gating. Difficult to evaluate wall motion.     Disposition   Pt is being discharged home today in good condition.  Follow-up  Plans & Appointments    Follow-up Information    Follow up with Herminio Commons, MD On 12/09/2015.   Specialty:  Cardiology   Why:  9:00AM. Cardiology followup   Contact information:   [place]  [id number] [phone]       Follow up On 11/25/2015.   Why:  please obtain 1 week BMET and CBC on 11/25/2015 and have result sent to Dr. Court Joy office     Discharge Instructions    AMB Referral to Cardiac Rehabilitation - Phase II    Complete by:  As directed   Diagnosis:  PCI     Amb Referral to Cardiac Rehabilitation    Complete by:  As directed   Diagnosis:  PCI           Discharge Medications   Current Discharge Medication List    START taking these medications   Details  clopidogrel (PLAVIX) 75 MG tablet Take 1 tablet (75 mg total) by mouth daily with breakfast.  Qty: 90 tablet, Refills: 3    pantoprazole (PROTONIX) 40 MG tablet Take 1 tablet (40 mg total) by mouth daily. Qty: 90 tablet, Refills: 3      CONTINUE these medications which have CHANGED   Details  furosemide (LASIX) 20 MG tablet Take 1 tablet (20 mg total) by mouth daily. Qty: 90 tablet, Refills: 1      CONTINUE these medications which have NOT CHANGED   Details  allopurinol (ZYLOPRIM) 300 MG tablet Take 600 mg by mouth daily.     aspirin EC 81 MG tablet Take 81 mg by mouth daily.    atorvastatin (LIPITOR) 20 MG tablet Take 20 mg by mouth every evening.     clobetasol cream (TEMOVATE) [id number] % Apply 1 application topically 2 (two) times daily.    fluticasone (FLONASE) 50 MCG/ACT nasal spray Place 1 spray into both nostrils 2 (two) times daily.    latanoprost (XALATAN) 0.005 % ophthalmic solution Place 1 drop into both eyes at bedtime.    levothyroxine (SYNTHROID, LEVOTHROID) 112 MCG tablet Take 112 mcg by mouth daily before breakfast.    metoprolol tartrate (LOPRESSOR) 25 MG tablet Take 0.5 tablets (12.5 mg total) by mouth daily. In the morning Qty: 45 tablet, Refills: 3      multivitamin (THERAGRAN) per tablet Take 1 tablet by mouth daily.     pyridOXINE (VITAMIN B-6) 100 MG tablet Take 100 mg by mouth daily.     Tamsulosin HCl (FLOMAX) 0.4 MG CAPS Take 0.4 mg by mouth daily.     valsartan (DIOVAN) 320 MG tablet Take 320 mg by mouth daily.     nitroGLYCERIN (NITROSTAT) 0.4 MG SL tablet Place 0.4 mg under the tongue every 5 (five) minutes x 3 doses as needed. For chest pain      STOP taking these medications     omeprazole (PRILOSEC) 20 MG capsule          Aspirin prescribed at discharge?  Yes High Intensity Statin Prescribed? (Lipitor 40-80mg  or Crestor 20-40mg ): No, on home dose of lipitor Beta Blocker Prescribed? Yes For EF 45% or less, Was ACEI/ARB Prescribed? Yes ADP Receptor Inhibitor Prescribed? (i.e. Plavix etc.-Includes Medically Managed Patients): Yes For EF <40%, Aldosterone Inhibitor Prescribed? No, reassess on followup Was EF assessed during THIS hospitalization? Yes Was Cardiac Rehab II ordered? (Included Medically managed Patients): Yes   Outstanding Labs/Studies   CBC and BMET 1 week after discharge  Duration of Discharge Encounter   Greater than 30 minutes including physician time.  Signed, Almyra Deforest PA-C 11/18/2015, 10:06 AM

## 2015-11-18 NOTE — Discharge Instructions (Signed)
No lifting over 5 lbs for 1 week. No sexual activity for 1 week. Keep procedure site clean & dry. If you notice increased pain, swelling, bleeding or pus, call/return!  You may shower, but no soaking baths/hot tubs/pools for 1 week.   STOP OMEPRAZOLE (PRILOSEC)  CHANGED TO PANTOPRAZOLE (PROTONIX)

## 2015-11-18 NOTE — Progress Notes (Signed)
Patient Name: [name] Date of Encounter: 11/18/2015  Primary Cardiologist: Dr. Bronson Ing   Principal Problem:   Dyspnea Active Problems:   Hyperlipidemia   HTN (hypertension)   CAD (coronary artery disease)   Hx of CABG   Aortic stenosis   Abnormal cardiovascular stress test   CAD (coronary artery disease) of bypass graft    SUBJECTIVE  Denies any CP or SOB, feeling well.  CURRENT MEDS . allopurinol  600 mg Oral Daily  . aspirin EC  81 mg Oral Daily  . atorvastatin  20 mg Oral QPM  . clopidogrel  75 mg Oral Q breakfast  . fluticasone  1 spray Each Nare BID  . furosemide  40 mg Oral Daily  . irbesartan  300 mg Oral Daily  . latanoprost  1 drop Both Eyes QHS  . levothyroxine  112 mcg Oral QAC breakfast  . metoprolol tartrate  12.5 mg Oral Daily  . multivitamin with minerals  1 tablet Oral Daily  . pantoprazole  40 mg Oral Daily  . pyridOXINE  100 mg Oral Daily  . sodium chloride flush  3 mL Intravenous Q12H  . tamsulosin  0.4 mg Oral Daily    OBJECTIVE  Filed Vitals:   11/17/15 1915 11/17/15 1958 11/18/15 0035 11/18/15 0600  BP: 106/28 106/61 123/64 125/71  Pulse: 83 85  75  Temp:  98.5 F (36.9 C)  98 F (36.7 C)  TempSrc:  Oral  Oral  Resp: 19 25 23 16   Height:      Weight:    175 lb 14.8 oz (79.8 kg)  SpO2: 97% 96% 96% 96%    Intake/Output Summary (Last 24 hours) at 11/18/15 0744 Last data filed at 11/18/15 [id number]  Gross per 24 hour  Intake   1250 ml  Output   2200 ml  Net   -950 ml   Filed Weights   11/17/15 0757 11/18/15 0600  Weight: 180 lb (81.647 kg) 175 lb 14.8 oz (79.8 kg)    PHYSICAL EXAM  General: Pleasant, NAD. Neuro: Alert and oriented X 3. Moves all extremities spontaneously. Psych: Normal affect. HEENT:  Normal  Neck: Supple without bruits or JVD. Lungs:  Resp regular and unlabored, CTA. Heart: RRR no s3, s4, or murmurs. L radial cath site stable, without significant bleeding or hematoma Abdomen: Soft, non-tender,  non-distended, BS + x 4.  Extremities: No clubbing, cyanosis. DP/PT/Radials 2+ and equal bilaterally. Trace ankle edema  Accessory Clinical Findings  CBC  Recent Labs  11/17/15 0819 11/18/15 0602  WBC 8.1 7.1  HGB 10.7* 9.5*  HCT 31.7* 29.3*  MCV 96.6 96.4  PLT 171 Q000111Q   Basic Metabolic Panel  Recent Labs  11/17/15 0819 11/18/15 0602  NA 139 138  K 3.9 3.7  CL 102 104  CO2 27 28  GLUCOSE 113* 103*  BUN 13 10  CREATININE 0.92 0.85  CALCIUM 9.5 9.1    TELE NSR with PACs, occasional wenchebach    ECG  NSR with 1st degree AV block, no obvious ST-T wave changes  Echocardiogram 11/02/2015  LV EF: 45% - 50%  ------------------------------------------------------------------- Study Conclusions  - Left ventricle: The cavity size was normal. Wall thickness was  increased in a pattern of mild LVH. Systolic function was mildly  reduced. The estimated ejection fraction was in the range of 45%  to 50%. There is hypokinesis of the basal-midinferolateral  myocardium. Features are consistent with a pseudonormal left  ventricular filling pattern, with concomitant abnormal relaxation  and increased filling pressure (grade 2 diastolic dysfunction). - Aortic valve: Moderately calcified annulus. Moderately calcified  leaflets. Cannot exclude functionally bicuspid valve. Cusp  separation was severely reduced. There was moderate to severe  stenosis. There was trivial regurgitation. Mean gradient (S): 19  mm Hg. Peak gradient (S): 40 mm Hg. Peak velocity ratio of LVOT  to aortic valve: 0.28. Valve area (Vmax): 1.29 cm^2. - Mitral valve: Calcified annulus. There was mild to moderate  regurgitation. - Left atrium: The atrium was mildly dilated. - Right atrium: The atrium was at the upper limits of normal in  size. - Tricuspid valve: There was mild regurgitation. - Pulmonary arteries: PA peak pressure: 27 mm Hg (S). - Pericardium, extracardiac: There was no  pericardial effusion.  Impressions:  - Mild LVH with LVEF estimated in the range of 45-50%. Accuracy of  LVEF is difficult as some views look fairly well preserved  although perhaps foreshortened, and other views suggest at least  mildly reduced LVEF overall. There is mid to basal inferolateral  hypokinesis and grade 2 diastolic dysfunction with increased LV  filling pressure. Mild left atrial enlargement. Moderate MAC with  mild to moderate mitral regurgitation. Moderate to severe  calcific aortic stenosis as outlined above with trivial aortic  regurgitation. Mild tricuspid regurgitation with PASP estimated  27 mmHg.     Radiology/Studies  Nm Myocar Multi W/spect W/wall Motion / Ef  11/02/2015   Equivocal ST segment depression in leads III, V5 and V6 without diagnostic change from baseline. Frequent ventricular ectopy noted throughout with no sustained arrhythmias.  Small, moderate intensity, apical inferolateral defect that is reversible, with fixed inferior defect from mid to basal level. This suggests scar with region of moderate peri-infarct ischemia near the apex.  Small, mild intensity, partially reversible basal inferoseptal defect consistent with small region of ischemia.  This is a high risk study based mainly on the calculation of LVEF, although this may be artifactual as noted. The findings are consistent with ischemic heart disease in any event.  Nuclear stress EF: 29% in the setting of frequent ventricular ectopy, suspect problems with gating. Difficult to evaluate wall motion.     ASSESSMENT AND PLAN  1. Exertional dyspnea with abnormal myoview  - fixed inferior defect from mid to basal level with region of peri-infarct ischemia near the apex, partially reversible basal inferoseptal defect consistent with small region of ischemia  - cath 11/17/2015 patent LIMA to LAD, patent SVG to OM, patent SVG to PDA with 90% severe stenosis in prox SVG treated with DES,  EF 35%. Mild to moderate AS with mean gradient 18 mmHg, AVA 1.8  - continue ASA, plavix, metoprolol, lipitor, irbesartan.   - He had h/o syncope on high dose Toprol, currently on 12.5mg  BID metoprolol, telemetry does show baseline 1st degree AV block with occasional wenckebach, unable to uptitrate BB. Post L radial cath, his hgb did dip a little, however cath site appears to be good, no bleeding noted, likely post procedural, would not pursue further workup at this time. Renal function able, he was placed on 40mg  daily of lasix, per patient he was previously only on 20mg  lasix every third day. Hesitant to give him too much lasix, will decrease it down to 20mg  daily. He will need 1 week CBC and BMET given anemia and higher dose of lasix. Expect discharge today and followup with Dr. Bronson Ing in 2-4 weeks.  2. CAD s/p CABG  3. Moderate to severe AS: mild to moderate by cath.  Followed Dr. Bronson Ing, likely eventual TAVR   4. Syncope with implantable loop recorder: Interrogation from 08/2015 previously reviewed. No recurrent episodes.   5. Carotid artery disease s/p L CEA in 2009  6. HLD   Signed, Almyra Deforest PA-C Pager: [phone]   Patient seen and examined. Agree with assessment and plan. Able to walk this am without chest pain or dyspnea.  Cath data reviewed with patient.  Discussed potential twilight study with patient, will continue with his long term plavix.  Will need  F/u of AS in future. Plan dc today.   Troy Sine, MD, [facility] 11/18/2015 8:48 AM

## 2015-11-18 NOTE — Progress Notes (Signed)
CARDIAC REHAB PHASE I   PRE:  Rate/Rhythm: 35 SR c/ PVCs  BP:  Sitting: 94/45        SaO2: 97 RA  MODE:  Ambulation: 500 ft   POST:  Rate/Rhythm: 105 ST c/ PVCs  BP:  Sitting: 134/65         SaO2: 96 RA  Pt ambulated 500 ft on RA, handheld assist, fairly steady gait, tolerated well, no complaints, states he can "tell the difference" after his procedure and feels much better. Completed PCI/stent education.  Reviewed risk factors, anti-platelet therapy, stent card, activity restrictions, ntg, exercise, heart healthy diet, sodium restrictions, CHF booklet and zone tool, daily weights, and phase 2 cardiac rehab. Pt verbalized understanding, receptive to education. Pt agrees to phase 2 cardiac rehab referral, will send to [place] per pt request. Pt to recliner after walk, call bell within reach.   [id number] Lenna Sciara, RN, BSN 11/18/2015 9:16 AM

## 2015-11-21 LAB — CUP PACEART REMOTE DEVICE CHECK: Date Time Interrogation Session: 20170131193737

## 2015-11-21 NOTE — Progress Notes (Signed)
[facility] summary report received. Battery status OK. Normal device function. No new symptom episodes, tachy episodes, or brady episodes. No new AF episodes. 1 pause episode 3 seconds, previously addressed. Monthly summary reports and ROV/PRN

## 2015-11-22 ENCOUNTER — Ambulatory Visit (INDEPENDENT_AMBULATORY_CARE_PROVIDER_SITE_OTHER): Payer: Medicare HMO | Admitting: *Deleted

## 2015-11-22 DIAGNOSIS — R55 Syncope and collapse: Secondary | ICD-10-CM | POA: Diagnosis not present

## 2015-11-22 NOTE — Progress Notes (Signed)
Carelink Summary Report / Loop Recorder 

## 2015-11-25 ENCOUNTER — Encounter (HOSPITAL_COMMUNITY): Payer: Self-pay | Admitting: Cardiology

## 2015-11-25 ENCOUNTER — Other Ambulatory Visit: Payer: Self-pay | Admitting: Physician Assistant

## 2015-11-25 ENCOUNTER — Telehealth: Payer: Self-pay | Admitting: Physician Assistant

## 2015-11-25 DIAGNOSIS — I25709 Atherosclerosis of coronary artery bypass graft(s), unspecified, with unspecified angina pectoris: Secondary | ICD-10-CM | POA: Diagnosis not present

## 2015-11-25 NOTE — Telephone Encounter (Signed)
Call received directly from [facility]. The patient is a there for labs today- BMP/ CBC post hospital discharge. They need diagnosis codes for labs. Dx code for CAD given- I25.709 given. [facility] will call back if additional info is needed.

## 2015-11-26 ENCOUNTER — Encounter: Payer: Self-pay | Admitting: Physician Assistant

## 2015-11-26 LAB — CBC WITH DIFFERENTIAL/PLATELET
BASOS ABS: 0 10*3/uL (ref 0.0–0.2)
BASOS: 0 %
EOS (ABSOLUTE): 0.2 10*3/uL (ref 0.0–0.4)
Eos: 3 %
HEMOGLOBIN: 10.7 g/dL — AB (ref 12.6–17.7)
Hematocrit: 32.4 % — ABNORMAL LOW (ref 37.5–51.0)
IMMATURE GRANS (ABS): 0 10*3/uL (ref 0.0–0.1)
Immature Granulocytes: 0 %
LYMPHS ABS: 1.2 10*3/uL (ref 0.7–3.1)
LYMPHS: 18 %
MCH: 31.6 pg (ref 26.6–33.0)
MCHC: 33 g/dL (ref 31.5–35.7)
MCV: 96 fL (ref 79–97)
MONOCYTES: 9 %
Monocytes Absolute: 0.6 10*3/uL (ref 0.1–0.9)
NEUTROS ABS: 4.4 10*3/uL (ref 1.4–7.0)
Neutrophils: 70 %
Platelets: 207 10*3/uL (ref 150–379)
RBC: 3.39 x10E6/uL — ABNORMAL LOW (ref 4.14–5.80)
RDW: 14.5 % (ref 12.3–15.4)
WBC: 6.5 10*3/uL (ref 3.4–10.8)

## 2015-11-26 LAB — BASIC METABOLIC PANEL
BUN / CREAT RATIO: 19 (ref 10–24)
BUN: 18 mg/dL (ref 8–27)
CO2: 26 mmol/L (ref 18–29)
CREATININE: 0.95 mg/dL (ref 0.76–1.27)
Calcium: 9.3 mg/dL (ref 8.6–10.2)
Chloride: 95 mmol/L — ABNORMAL LOW (ref 96–106)
GFR, EST AFRICAN AMERICAN: 86 mL/min/{1.73_m2} (ref >59)
GFR, EST NON AFRICAN AMERICAN: 74 mL/min/{1.73_m2} (ref >59)
GLUCOSE: 102 mg/dL — AB (ref 65–99)
Potassium: 4.1 mmol/L (ref 3.5–5.2)
SODIUM: 136 mmol/L (ref 134–144)

## 2015-11-30 DIAGNOSIS — H401131 Primary open-angle glaucoma, bilateral, mild stage: Secondary | ICD-10-CM | POA: Diagnosis not present

## 2015-12-09 ENCOUNTER — Encounter: Payer: Self-pay | Admitting: Cardiovascular Disease

## 2015-12-09 ENCOUNTER — Ambulatory Visit (INDEPENDENT_AMBULATORY_CARE_PROVIDER_SITE_OTHER): Payer: Medicare HMO | Admitting: Cardiovascular Disease

## 2015-12-09 VITALS — BP 111/52 | HR 76 | Ht 69.0 in | Wt 178.0 lb

## 2015-12-09 DIAGNOSIS — R55 Syncope and collapse: Secondary | ICD-10-CM | POA: Diagnosis not present

## 2015-12-09 DIAGNOSIS — I25768 Atherosclerosis of bypass graft of coronary artery of transplanted heart with other forms of angina pectoris: Secondary | ICD-10-CM | POA: Diagnosis not present

## 2015-12-09 DIAGNOSIS — Z955 Presence of coronary angioplasty implant and graft: Secondary | ICD-10-CM

## 2015-12-09 DIAGNOSIS — I779 Disorder of arteries and arterioles, unspecified: Secondary | ICD-10-CM | POA: Diagnosis not present

## 2015-12-09 DIAGNOSIS — I350 Nonrheumatic aortic (valve) stenosis: Secondary | ICD-10-CM | POA: Diagnosis not present

## 2015-12-09 DIAGNOSIS — E785 Hyperlipidemia, unspecified: Secondary | ICD-10-CM

## 2015-12-09 DIAGNOSIS — I739 Peripheral vascular disease, unspecified: Secondary | ICD-10-CM

## 2015-12-09 DIAGNOSIS — I10 Essential (primary) hypertension: Secondary | ICD-10-CM

## 2015-12-09 NOTE — Patient Instructions (Signed)
Continue all current medications. Your physician wants you to follow up in: 6 months.  You will receive a reminder letter in the mail one-two months in advance.  If you don't receive a letter, please call our office to schedule the follow up appointment   

## 2015-12-09 NOTE — Progress Notes (Signed)
Patient ID: [name], male   DOB: 03-01-1931, 81 y.o.   MRN: [id number]      SUBJECTIVE: The patient returns for posthospitalization follow-up. He underwent coronary angiography on 11/17/15 which demonstrated a 90% stenosis in the SVG to the RCA for which she went drug-eluting stent placement. He had mild to moderate aortic stenosis with a mean gradient of 18 mmHg, 1.8 cm valve area. Telemetry showed first-degree AV block with occasional [name]. Lasix was decreased to 20 mg daily.  He is feeling much better with regards shortness of breath. He continues to exercise every morning. He would like to play golf. He denies chest pain and syncope. Hemoglobin was 10.7 on 4/6.   Review of Systems: As per "subjective", otherwise negative.  Allergies  Allergen Reactions  . Propylene Glycol Hives and Rash    Current Outpatient Prescriptions  Medication Sig Dispense Refill  . allopurinol (ZYLOPRIM) 300 MG tablet Take 600 mg by mouth daily.     Marland Kitchen aspirin EC 81 MG tablet Take 81 mg by mouth daily.    Marland Kitchen atorvastatin (LIPITOR) 20 MG tablet Take 20 mg by mouth every evening.     . clobetasol cream (TEMOVATE) AB-123456789 % Apply 1 application topically 2 (two) times daily.    . clopidogrel (PLAVIX) 75 MG tablet Take 1 tablet (75 mg total) by mouth daily with breakfast. 90 tablet 3  . fluticasone (FLONASE) 50 MCG/ACT nasal spray Place 1 spray into both nostrils 2 (two) times daily.    . furosemide (LASIX) 20 MG tablet Take 1 tablet (20 mg total) by mouth daily. 90 tablet 1  . latanoprost (XALATAN) 0.005 % ophthalmic solution Place 1 drop into both eyes at bedtime.    Marland Kitchen levothyroxine (SYNTHROID, LEVOTHROID) 112 MCG tablet Take 112 mcg by mouth daily before breakfast.    . metoprolol tartrate (LOPRESSOR) 25 MG tablet Take 0.5 tablets (12.5 mg total) by mouth daily. In the morning 45 tablet 3  . multivitamin (THERAGRAN) per tablet Take 1 tablet by mouth daily.     . nitroGLYCERIN (NITROSTAT) 0.4 MG SL  tablet Place 0.4 mg under the tongue every 5 (five) minutes x 3 doses as needed. For chest pain    . pantoprazole (PROTONIX) 40 MG tablet Take 1 tablet (40 mg total) by mouth daily. 90 tablet 3  . pyridOXINE (VITAMIN B-6) 100 MG tablet Take 100 mg by mouth daily.     . Tamsulosin HCl (FLOMAX) 0.4 MG CAPS Take 0.4 mg by mouth daily.     . valsartan (DIOVAN) 320 MG tablet Take 320 mg by mouth daily.      No current facility-administered medications for this visit.    Past Medical History  Diagnosis Date  . Hyperlipidemia   . Benign prostatic hypertrophy   . Carotid artery disease ([place])     DES, SVG to circumflex marginal, October, 2010 ( SVG to PDA and PLA patent , LIMA to LAD patent, EF 60%, mild inferior hypokinesis; H/o mild CHF, CABG  . CAD (coronary artery disease)     DES, SVG to circumflex marginal, October, 2010 ( SVG to PDA and PLA patent , LIMA to LAD patent, EF 60%, mild inferior hypokinesis  . Syncope     Remote, etiology unknown  . Psoriasis     severe; with total skin exfoliation in the past resolved  . Hx of decompressive lumbar laminectomy     Dr.Botero December, 2011  . Aortic stenosis     Mild, echo,  August, 2012  . HTN (hypertension)     takes meds daily  . Hypothyroidism   . Nephrolithiasis   . GERD (gastroesophageal reflux disease)     takes daily  . Arthritis   . PVC's (premature ventricular contractions)     May, 2014  . Ventricular tachycardia, non-sustained ([place])   . Neuromuscular disorder [facility])     Past Surgical History  Procedure Laterality Date  . Decompression of left median nerve    . Left carotid endarectomy and dacron patch angioplasty]    . Bilateral l3-4 laminectomies    . Removal of synovial cyst    . Posterior arthrodesis with autograft and allograft    . Colonoscopy  11/23/2011    Procedure: COLONOSCOPY;  Surgeon: Rogene Houston, MD;  [facility];  Service: Endoscopy;  Laterality: N/A;  730  . Back surgery      x3  .  Tonsillectomy    . Appendectomy    . Total knee arthroplasty  04/16/2012    Procedure: TOTAL KNEE ARTHROPLASTY;  Surgeon: Garald Balding, MD;  Location: [place];  Service: Orthopedics;  Laterality: Left;  . Cataract extraction, bilateral    . Joint replacement Left Sept. 27, 2013    Knee  . Spine surgery      X's 3  . Carotid endarterectomy Left 02-24-08    cea  Dr. Amedeo Plenty  . Carelink remote monitoring  Oct. 29, 2015    by Dr. [name]  . Carpal tunnel release Bilateral 2010  . Left heart catheterization with coronary angiogram N/A 12/27/2012    Procedure: LEFT HEART CATHETERIZATION WITH CORONARY ANGIOGRAM;  Surgeon: Burnell Blanks, MD;  Location: [facility] CATH LAB;  Service: Cardiovascular;  Laterality: N/A;  . Coronary stent placement  11/17/2015    SVG   DES to RCA  . Coronary artery bypass graft  1995  . Cardiac catheterization N/A 11/17/2015    Procedure: Right/Left Heart Cath and Coronary Angiography;  Surgeon: Peter M Martinique, MD;  Location: [place] CV LAB;  Service: Cardiovascular;  Laterality: N/A;  . Cardiac catheterization N/A 11/17/2015    Procedure: Coronary Stent Intervention;  Surgeon: Peter M Martinique, MD;  Location: [place] CV LAB;  Service: Cardiovascular;  Laterality: N/A;    Social History   Social History  . Marital Status: Married    Spouse Name: N/A  . Number of Children: 2  . Years of Education: 16   Occupational History  . Retired    Social History Main Topics  . Smoking status: Never Smoker   . Smokeless tobacco: Never Used  . Alcohol Use: No     Comment: One beer per week.  . Drug Use: No  . Sexual Activity: Not Currently   Other Topics Concern  . Not on file   Social History Narrative   Lives at home with wife.   1 cup coffee per day.   Right-handed.     Filed Vitals:   12/09/15 0839  BP: 111/52  Pulse: 76  Height: 5\' 9"  (1.753 m)  Weight: 178 lb (80.74 kg)    PHYSICAL EXAM General: NAD HEENT: Normal. Neck: No JVD, no  thyromegaly. Lungs: Clear to auscultation bilaterally with normal respiratory effort. CV: Nondisplaced PMI. Regular rate and rhythm, normal S1/diminished S2, no S3/S4, harsh III/VI late-peaking, ejection systolic murmur. No pretibial or periankle edema.   Abdomen: Soft, nontender, no distention.  Neurologic: Alert and oriented x 3.  Psych: Normal affect. Skin: Normal.  Musculoskeletal: No gross deformities. Extremities: No clubbing or cyanosis.   ECG: Most recent ECG reviewed.      ASSESSMENT AND PLAN: 1. CAD s/p CABG s/p DES to SVG->PDA: Continue dual antiplatelet therapy for one year. Continue statin and metoprolol.   2. Mild to moderate aortic stenosis by cath/moderate to severe by echo 11/02/15: Previously discussed eventual need for valve replacement, likely via the transcatheter approach. Continue Lasix 20 mg daily for ankle swelling and orthopnea.  3. Syncope with implantable loop recorder: Interrogation from 08/2015 previously reviewed. No recurrent episodes. Continue to monitor. Followed by EP.  4. S/p left CEA in 2009: Dopplers from 06/2015 previously reviewed. Continue ASA and statin therapy. Followed by vascular surgery.  5. Hyperlipidemia: On Lipitor 20 mg.  Dispo: f/u 6 months.  Kate Sable, M.D., F.A.C.C.

## 2015-12-20 ENCOUNTER — Ambulatory Visit (INDEPENDENT_AMBULATORY_CARE_PROVIDER_SITE_OTHER): Payer: Medicare HMO | Admitting: *Deleted

## 2015-12-20 ENCOUNTER — Other Ambulatory Visit (HOSPITAL_COMMUNITY): Payer: Self-pay

## 2015-12-20 DIAGNOSIS — R55 Syncope and collapse: Secondary | ICD-10-CM | POA: Diagnosis not present

## 2015-12-21 NOTE — Progress Notes (Signed)
Carelink Summary Report / Loop Recorder 

## 2015-12-29 ENCOUNTER — Encounter: Payer: Self-pay | Admitting: Cardiovascular Disease

## 2015-12-29 ENCOUNTER — Ambulatory Visit (INDEPENDENT_AMBULATORY_CARE_PROVIDER_SITE_OTHER): Payer: Medicare HMO | Admitting: Cardiovascular Disease

## 2015-12-29 VITALS — BP 115/65 | HR 84 | Ht 69.0 in | Wt 179.0 lb

## 2015-12-29 DIAGNOSIS — R55 Syncope and collapse: Secondary | ICD-10-CM

## 2015-12-29 DIAGNOSIS — E785 Hyperlipidemia, unspecified: Secondary | ICD-10-CM

## 2015-12-29 DIAGNOSIS — I350 Nonrheumatic aortic (valve) stenosis: Secondary | ICD-10-CM | POA: Diagnosis not present

## 2015-12-29 DIAGNOSIS — I10 Essential (primary) hypertension: Secondary | ICD-10-CM

## 2015-12-29 DIAGNOSIS — I779 Disorder of arteries and arterioles, unspecified: Secondary | ICD-10-CM

## 2015-12-29 DIAGNOSIS — R531 Weakness: Secondary | ICD-10-CM

## 2015-12-29 DIAGNOSIS — I25768 Atherosclerosis of bypass graft of coronary artery of transplanted heart with other forms of angina pectoris: Secondary | ICD-10-CM

## 2015-12-29 DIAGNOSIS — Z955 Presence of coronary angioplasty implant and graft: Secondary | ICD-10-CM

## 2015-12-29 DIAGNOSIS — I739 Peripheral vascular disease, unspecified: Secondary | ICD-10-CM

## 2015-12-29 MED ORDER — VALSARTAN 160 MG PO TABS: 160.0000 mg | ORAL_TABLET | Freq: Every day | ORAL | Status: DC

## 2015-12-29 NOTE — Patient Instructions (Signed)
Your physician recommends that you schedule a follow-up appointment in: [place] has recommended you make the following change in your medication:   DECREASE DIOVAN [place] physician recommends that you return for lab work CBC/BMP  Thank you for choosing [facility]!!

## 2015-12-29 NOTE — Progress Notes (Signed)
Patient ID: [name], male   DOB: [birth year], 82 y.o.   MRN: [id number]      SUBJECTIVE: The patient presents nearly 2 weeks after I saw him on 4/20 at which time he was doing well. He underwent coronary angiography on 11/17/15 which demonstrated a 90% stenosis in the SVG to the RCA for which he went drug-eluting stent placement. He had mild to moderate aortic stenosis with a mean gradient of 18 mmHg, 1.8 cm valve area by cath data.  Echocardiogram on 3/14 demonstrated moderate to severe stenosis with grade 2 diastolic dysfunction, mean gradient 19 mmHg.  He has been having "weak spells". Sometimes he can golf all day or exercise all morning and on some occasions he has to stop after walking an eighth of a mile to regain his strength.  Denies chest pain, leg swelling, and shortness of breath.    Review of Systems: As per "subjective", otherwise negative.  Allergies  Allergen Reactions  . Propylene Glycol Hives and Rash    Current Outpatient Prescriptions  Medication Sig Dispense Refill  . allopurinol (ZYLOPRIM) 300 MG tablet Take 600 mg by mouth daily.     Marland Kitchen aspirin EC 81 MG tablet Take 81 mg by mouth daily.    Marland Kitchen atorvastatin (LIPITOR) 20 MG tablet Take 20 mg by mouth every evening.     . clobetasol cream (TEMOVATE) AB-123456789 % Apply 1 application topically 2 (two) times daily.    . clopidogrel (PLAVIX) 75 MG tablet Take 1 tablet (75 mg total) by mouth daily with breakfast. 90 tablet 3  . fluticasone (FLONASE) 50 MCG/ACT nasal spray Place 1 spray into both nostrils 2 (two) times daily.    . furosemide (LASIX) 20 MG tablet Take 1 tablet (20 mg total) by mouth daily. 90 tablet 1  . latanoprost (XALATAN) 0.005 % ophthalmic solution Place 1 drop into both eyes at bedtime.    Marland Kitchen levothyroxine (SYNTHROID, LEVOTHROID) 112 MCG tablet Take 112 mcg by mouth daily before breakfast.    . metoprolol tartrate (LOPRESSOR) 25 MG tablet Take 0.5 tablets (12.5 mg total) by mouth daily. In the morning  45 tablet 3  . multivitamin (THERAGRAN) per tablet Take 1 tablet by mouth daily.     . nitroGLYCERIN (NITROSTAT) 0.4 MG SL tablet Place 0.4 mg under the tongue every 5 (five) minutes x 3 doses as needed. For chest pain    . pantoprazole (PROTONIX) 40 MG tablet Take 1 tablet (40 mg total) by mouth daily. 90 tablet 3  . pyridOXINE (VITAMIN B-6) 100 MG tablet Take 100 mg by mouth daily.     . Tamsulosin HCl (FLOMAX) 0.4 MG CAPS Take 0.4 mg by mouth daily.     . valsartan (DIOVAN) 320 MG tablet Take 320 mg by mouth daily.      No current facility-administered medications for this visit.    Past Medical History  Diagnosis Date  . Hyperlipidemia   . Benign prostatic hypertrophy   . Carotid artery disease ([place])     DES, SVG to circumflex marginal, October, 2010 ( SVG to PDA and PLA patent , LIMA to LAD patent, EF 60%, mild inferior hypokinesis; H/o mild CHF, CABG  . CAD (coronary artery disease)     DES, SVG to circumflex marginal, October, 2010 ( SVG to PDA and PLA patent , LIMA to LAD patent, EF 60%, mild inferior hypokinesis  . Syncope     Remote, etiology unknown  . Psoriasis     severe; with  total skin exfoliation in the past resolved  . Hx of decompressive lumbar laminectomy     Dr.Botero December, 2011  . Aortic stenosis     Mild, echo, August, 2012  . HTN (hypertension)     takes meds daily  . Hypothyroidism   . Nephrolithiasis   . GERD (gastroesophageal reflux disease)     takes daily  . Arthritis   . PVC's (premature ventricular contractions)     May, 2014  . Ventricular tachycardia, non-sustained ([place])   . Neuromuscular disorder [facility])     Past Surgical History  Procedure Laterality Date  . Decompression of left median nerve    . Left carotid endarectomy and dacron patch angioplasty]    . Bilateral l3-4 laminectomies    . Removal of synovial cyst    . Posterior arthrodesis with autograft and allograft    . Colonoscopy  11/23/2011    Procedure: COLONOSCOPY;  Surgeon:  Rogene Houston, MD;  [facility];  Service: Endoscopy;  Laterality: N/A;  730  . Back surgery      x3  . Tonsillectomy    . Appendectomy    . Total knee arthroplasty  04/16/2012    Procedure: TOTAL KNEE ARTHROPLASTY;  Surgeon: Garald Balding, MD;  Location: [place];  Service: Orthopedics;  Laterality: Left;  . Cataract extraction, bilateral    . Joint replacement Left Sept. 27, 2013    Knee  . Spine surgery      X's 3  . Carotid endarterectomy Left 02-24-08    cea  Dr. Amedeo Plenty  . Carelink remote monitoring  Oct. 29, 2015    by Dr. [name]  . Carpal tunnel release Bilateral 2010  . Left heart catheterization with coronary angiogram N/A 12/27/2012    Procedure: LEFT HEART CATHETERIZATION WITH CORONARY ANGIOGRAM;  Surgeon: Burnell Blanks, MD;  Location: [facility] C[facility] LAB;  Service: Cardiovascular;  Laterality: N/A;  . Coronary stent placement  11/17/2015    SVG   DES to RCA  . Coronary artery bypass graft  1995  . Cardiac catheterization N/A 11/17/2015    Procedure: Right/Left Heart Cath and Coronary Angiography;  Surgeon: Peter M Martinique, MD;  Location: [place] CV LAB;  Service: Cardiovascular;  Laterality: N/A;  . Cardiac catheterization N/A 11/17/2015    Procedure: Coronary Stent Intervention;  Surgeon: Peter M Martinique, MD;  Location: [place] CV LAB;  Service: Cardiovascular;  Laterality: N/A;    Social History   Social History  . Marital Status: Married    Spouse Name: N/A  . Number of Children: 2  . Years of Education: 16   Occupational History  . Retired    Social History Main Topics  . Smoking status: Never Smoker   . Smokeless tobacco: Never Used  . Alcohol Use: No     Comment: One beer per week.  . Drug Use: No  . Sexual Activity: Not Currently   Other Topics Concern  . Not on file   Social History Narrative   Lives at home with wife.   1 cup coffee per day.   Right-handed.     Filed Vitals:   12/29/15 0813  BP: 115/65  Pulse: 84    Height: 5\' 9"  (1.753 m)  Weight: 179 lb (81.194 kg)    PHYSICAL EXAM General: NAD HEENT: Normal. Neck: No JVD, no thyromegaly. Lungs: Clear to auscultation bilaterally with normal respiratory effort. CV: Nondisplaced PMI. Regular rate and rhythm, normal S1/diminished S2, no  S3/S4, harsh III/VI late-peaking, ejection systolic murmur. No pretibial or periankle edema.   Abdomen: Soft, nontender, no distention.  Neurologic: Alert and oriented x 3.  Psych: Normal affect. Skin: Normal. Musculoskeletal: No gross deformities. Extremities: No clubbing or cyanosis.    ECG: Most recent ECG reviewed.      ASSESSMENT AND PLAN: 1. CAD s/p CABG s/p DES to SVG->PDA: Continue dual antiplatelet therapy for one year. Continue statin and metoprolol.   2. Mild to moderate aortic stenosis by cath/moderate to severe by echo 11/02/15: Previously discussed eventual need for valve replacement, likely via the transcatheter approach. Continue Lasix 20 mg daily for ankle swelling and orthopnea.  3. Syncope with implantable loop recorder: Interrogation from 08/2015 previously reviewed. No recurrent episodes. Continue to monitor. Followed by EP.  4. S/p left CEA in 2009: Dopplers from 06/2015 previously reviewed. Continue ASA and statin therapy. Followed by vascular surgery.  5. Hyperlipidemia: On Lipitor 20 mg.  6. Generalized weakness: Will check CBC and BMET. He may be having hypotensive episodes. Will cut back Diovan to 160 mg daily.  7. Essential HTN: He may be having hypotensive episodes. Will cut back Diovan to 160 mg daily.   Dispo: f/u August   Kate Sable, M.D., F.A.C.C.

## 2016-01-05 ENCOUNTER — Encounter: Payer: Self-pay | Admitting: Internal Medicine

## 2016-01-13 DIAGNOSIS — I781 Nevus, non-neoplastic: Secondary | ICD-10-CM | POA: Diagnosis not present

## 2016-01-13 DIAGNOSIS — L570 Actinic keratosis: Secondary | ICD-10-CM | POA: Diagnosis not present

## 2016-01-13 DIAGNOSIS — L814 Other melanin hyperpigmentation: Secondary | ICD-10-CM | POA: Diagnosis not present

## 2016-01-13 DIAGNOSIS — L821 Other seborrheic keratosis: Secondary | ICD-10-CM | POA: Diagnosis not present

## 2016-01-14 DIAGNOSIS — I10 Essential (primary) hypertension: Secondary | ICD-10-CM | POA: Diagnosis not present

## 2016-01-14 DIAGNOSIS — D649 Anemia, unspecified: Secondary | ICD-10-CM | POA: Diagnosis not present

## 2016-01-14 DIAGNOSIS — I340 Nonrheumatic mitral (valve) insufficiency: Secondary | ICD-10-CM | POA: Diagnosis not present

## 2016-01-14 DIAGNOSIS — I5022 Chronic systolic (congestive) heart failure: Secondary | ICD-10-CM | POA: Diagnosis not present

## 2016-01-14 DIAGNOSIS — Z6827 Body mass index (BMI) 27.0-27.9, adult: Secondary | ICD-10-CM | POA: Diagnosis not present

## 2016-01-14 DIAGNOSIS — M109 Gout, unspecified: Secondary | ICD-10-CM | POA: Diagnosis not present

## 2016-01-14 DIAGNOSIS — Z79899 Other long term (current) drug therapy: Secondary | ICD-10-CM | POA: Diagnosis not present

## 2016-01-14 DIAGNOSIS — I2510 Atherosclerotic heart disease of native coronary artery without angina pectoris: Secondary | ICD-10-CM | POA: Diagnosis not present

## 2016-01-15 LAB — CUP PACEART REMOTE DEVICE CHECK: Date Time Interrogation Session: 20170401203545

## 2016-01-15 NOTE — Progress Notes (Signed)
Carelink summary report received. Battery status OK. Normal device function. No new symptom episodes, tachy episodes, brady, or pause episodes. No new AF episodes. Monthly summary reports and ROV/PRN 

## 2016-01-19 ENCOUNTER — Encounter: Payer: Self-pay | Admitting: Internal Medicine

## 2016-01-19 ENCOUNTER — Ambulatory Visit (INDEPENDENT_AMBULATORY_CARE_PROVIDER_SITE_OTHER): Payer: Medicare HMO | Admitting: *Deleted

## 2016-01-19 DIAGNOSIS — R55 Syncope and collapse: Secondary | ICD-10-CM | POA: Diagnosis not present

## 2016-01-20 ENCOUNTER — Encounter: Payer: Self-pay | Admitting: Neurology

## 2016-01-20 ENCOUNTER — Ambulatory Visit (INDEPENDENT_AMBULATORY_CARE_PROVIDER_SITE_OTHER): Payer: Medicare HMO | Admitting: Neurology

## 2016-01-20 VITALS — BP 122/65 | HR 84 | Ht 69.0 in | Wt 178.5 lb

## 2016-01-20 DIAGNOSIS — R202 Paresthesia of skin: Secondary | ICD-10-CM | POA: Diagnosis not present

## 2016-01-20 DIAGNOSIS — G5601 Carpal tunnel syndrome, right upper limb: Secondary | ICD-10-CM

## 2016-01-20 DIAGNOSIS — R269 Unspecified abnormalities of gait and mobility: Secondary | ICD-10-CM

## 2016-01-20 MED ORDER — DICLOFENAC SODIUM 1 % TD GEL: 4.0000 g | Freq: Four times a day (QID) | TRANSDERMAL | Status: DC

## 2016-01-20 NOTE — Progress Notes (Signed)
Chief Complaint  Patient presents with  . Abnormal gait    He continues to have an unsteady gait but is able to walk unassisted.Marland Kitchen  He saw a neurosurgeon but he did not recommend surgery.  He is actually having increased difficulty today due to twisting his right leg wrong while playing golf on 01/19/16.    Marland Kitchen Peripheral Neuropathy    Reports decreased sensation in his bilateral legs from his knees into feet.  . Passing Out Spells    He had been having passing out episodes but this has improved after having a stent placed one month ago.      PATIENT: CHRLES BUCIO DOB: 09/25/1930  HISTORICAL  JONQUEZ COURIER is 82 years old left-handed male, referred by his primary care physician Dr. Willey Blade for evaluation of peripheral neuropathy in May [id number]  He has complicated medical history, gout, has been on allopurinol treatment 300 mg twice a day since 2006, previous history of severe psoriasis with whole-body skin exfoliation now is in remission, coronary artery disease, status post open heart surgery, syncope, had ICD implant, left carotid endarterectomy, previous cervical decompression surgery for cervical radiculopathy, lumbar decompression surgery for severe low back pain, left knee replacement in August 2013   Prior to his left knee replacement, he noticed bilateral foot, leg numbness, gradually getting worse over the past 3 years, now has dense numbness of bilateral lower extremity from knee down," like walking on two piece of wooden block", sometimes he has difficulty feeling the paddle when driving, he also noticed unbalanced gait, but no significant weakness  Laboratory evaluation December 01 2014, normal vitamin B12 age 56, CBC showed mild anemia hemoglobin 11.8   He denies significant neck pain, low back pain, no bowel and bladder incontinence, he denies bilateral upper extremity paresthesia.   UPDATE May 26th 2016: EMG nerve conduction study in May 2016, showed mild peripheral neuropathy,  mild chronic bilateral lumbosacral radiculopathy. Laboratory evaluation showed elevated A1c 6.1, elevated glucose 121, otherwise normal or negative CMP CBC, CP K, TSH, B12, RPR, Lyme titer, protein electrophoresis  We have reviewed MRI of lumbar,  L2-L3 there has been a prior right hemilaminectomy. There is transverse spinal stenosis due to severe facet hypertrophy, retrolisthesis, endplate spurring and disc protrusion. Moderately severe foraminal narrowing could cause compression of either of the exiting L2 nerve roots. 2. At L3-L4, there has been prior decompression surgery. There is severe facet hypertrophy, left greater than right, disc bulging and left endplate spurring. There is moderately severe foraminal narrowing that could lead to compression of either of the L3 nerve roots.  At L4-L5, there has been prior decompression surgery and the posterior elements might be fused. There is moderately severe foraminal narrowing to either side that could lead to compression of either of the L4 nerve roots. Moderate degenerative changes are noted at T12-L1, L1-L2 and L5-S1 with little potential for nerve root compression at those levels.  He has lumbar decompression surgery twice, most recent was 07/2010 by Dr. Joya Salm, he has no significant low back pain, no bowel and bladder incontinence, mild unsteady gait, he does not want to consider lumbar decompression surgery at this point   UPDATE Jul 22 2015: He complains of progressive worsening bilateral lower extremity numbness from knee down, mild unsteady gait, especially walking better feet, he denies bowel and bladder incontinence, he denies significant low back pain,  We again personally reviewed MRI of the lumbar film in May 2016, multilevel degenerative disc disease, most severe at  L3-4, with moderate to severe canal stenosis, bilateral foraminal stenosis,  UPDATE January 20 2016: He continue to exercise regularly, enjoy playing golf, fishing, slow  worsening bilateral lower extremity ascending numbness to knee level now, mild unsteady gait,  He had occasionally fainting spells, recently underwent echocardiogram November 02 2015, mild to moderate aortic stenosis, implantable loop recorder, was placed   REVIEW OF SYSTEMS: Full 14 system review of systems performed and notable only for as above ALLERGIES: Allergies  Allergen Reactions  . Propylene Glycol Hives and Rash    HOME MEDICATIONS: Current Outpatient Prescriptions  Medication Sig Dispense Refill  . allopurinol (ZYLOPRIM) 300 MG tablet Take 600 mg by mouth daily.     Marland Kitchen aspirin EC 81 MG tablet Take 81 mg by mouth daily.    Marland Kitchen atorvastatin (LIPITOR) 20 MG tablet Take 20 mg by mouth every evening.     . clobetasol cream (TEMOVATE) [id number] % Apply 1 application topically 2 (two) times daily.    . clopidogrel (PLAVIX) 75 MG tablet Take 1 tablet (75 mg total) by mouth daily with breakfast. 90 tablet 3  . fluticasone (FLONASE) 50 MCG/ACT nasal spray Place 1 spray into both nostrils 2 (two) times daily.    . furosemide (LASIX) 20 MG tablet Take 1 tablet (20 mg total) by mouth daily. 90 tablet 1  . latanoprost (XALATAN) 0.005 % ophthalmic solution Place 1 drop into both eyes at bedtime.    Marland Kitchen levothyroxine (SYNTHROID, LEVOTHROID) 112 MCG tablet Take 112 mcg by mouth daily before breakfast.    . metoprolol tartrate (LOPRESSOR) 25 MG tablet Take 0.5 tablets (12.5 mg total) by mouth daily. In the morning 45 tablet 3  . multivitamin (THERAGRAN) per tablet Take 1 tablet by mouth daily.     . nitroGLYCERIN (NITROSTAT) 0.4 MG SL tablet Place 0.4 mg under the tongue every 5 (five) minutes x 3 doses as needed. For chest pain    . pantoprazole (PROTONIX) 40 MG tablet Take 1 tablet (40 mg total) by mouth daily. 90 tablet 3  . pyridOXINE (VITAMIN B-6) 100 MG tablet Take 100 mg by mouth daily.     . Tamsulosin HCl (FLOMAX) 0.4 MG CAPS Take 0.4 mg by mouth daily.     . valsartan (DIOVAN) 160 MG tablet Take  1 tablet (160 mg total) by mouth daily. 90 tablet 3   No current facility-administered medications for this visit.    PAST MEDICAL HISTORY: Past Medical History  Diagnosis Date  . Hyperlipidemia   . Benign prostatic hypertrophy   .  coronary artery disease     DES, SVG to circumflex marginal, October, 2010 ( SVG to PDA and PLA patent , LIMA to LAD patent, EF 60%, mild inferior hypokinesis; H/o mild CHF, CABG  . CAD (coronary artery disease)     DES, SVG to circumflex marginal, October, 2010 ( SVG to PDA and PLA patent , LIMA to LAD patent, EF 60%, mild inferior hypokinesis  . Syncope     Remote, etiology unknown  . Psoriasis     severe; with total skin exfoliation in the past resolved  . Hx of decompressive lumbar laminectomy     Dr.Botero December, 2011  . Aortic stenosis     Mild, echo, August, 2012  . HTN (hypertension)     takes meds daily  . Hypothyroidism   . Nephrolithiasis   . GERD (gastroesophageal reflux disease)     takes daily  . Arthritis   . Neuromuscular disorder   .  PVC's (premature ventricular contractions)     May, 2014  . Ventricular tachycardia, non-sustained     PAST SURGICAL HISTORY: Past Surgical History  Procedure Laterality Date  . Decompression of left median nerve    . Left carotid endarectomy and dacron patch angioplasty]    . Bilateral l3-4 laminectomies    . Removal of synovial cyst    . Posterior arthrodesis with autograft and allograft    . Colonoscopy  11/23/2011    Procedure: COLONOSCOPY;  Surgeon: Rogene Houston, MD;  [facility];  Service: Endoscopy;  Laterality: N/A;  730  . Back surgery      x3  . Tonsillectomy    . Appendectomy    . Total knee arthroplasty  04/16/2012    Procedure: TOTAL KNEE ARTHROPLASTY;  Surgeon: Garald Balding, MD;  Location: [facility];  Service: Orthopedics;  Laterality: Left;  . Cataract extraction, bilateral    . Joint replacement Left Sept. 27, 2013    Knee  . Spine surgery      X's 3  .  Carotid endarterectomy Left 02-24-08    cea  Dr. Amedeo Plenty  . Carelink remote monitoring  Oct. 29, 2015    by Dr. [name]  . Carpal tunnel release Bilateral 2010  . Left heart catheterization with coronary angiogram N/A 12/27/2012    Procedure: LEFT HEART CATHETERIZATION WITH CORONARY ANGIOGRAM;  Surgeon: Burnell Blanks, MD;  Location: [facility] CATH LAB;  Service: Cardiovascular;  Laterality: N/A;  . Coronary stent placement  11/17/2015    SVG   DES to RCA  . Coronary artery bypass graft  1995  . Cardiac catheterization N/A 11/17/2015    Procedure: Right/Left Heart Cath and Coronary Angiography;  Surgeon: Peter M Martinique, MD;  Location: [place] CV LAB;  Service: Cardiovascular;  Laterality: N/A;  . Cardiac catheterization N/A 11/17/2015    Procedure: Coronary Stent Intervention;  Surgeon: Peter M Martinique, MD;  Location: [place] CV LAB;  Service: Cardiovascular;  Laterality: N/A;    FAMILY HISTORY: Family History  Problem Relation Age of Onset  . Colon cancer Neg Hx   . Heart attack Mother   . Heart disease Mother     Heart Disease before age 66  . Heart disease Father     Heart Disease before age 98  . Hypertension Father   . Hyperlipidemia Father   . Heart attack Son 93    SOCIAL HISTORY:  History   Social History  . Marital Status: Married    Spouse Name: N/A  . Number of Children: 2  . Years of Education: N/A   Occupational History  . Not on file.   Social History Main Topics  . Smoking status: Never Smoker   . Smokeless tobacco: Never Used  . Alcohol Use: Yes     Comment: Occasional  . Drug Use: No  . Sexual Activity: Not Currently   Other Topics Concern  . Not on file   Social History Narrative     PHYSICAL EXAM   Filed Vitals:   01/20/16 0739  BP: 122/65  Pulse: 84  Height: 5\' 9"  (1.753 m)  Weight: 178 lb 8 oz (80.967 kg)    Not recorded      Body mass index is 26.35 kg/(m^2).  PHYSICAL EXAMNIATION:  Gen: NAD, conversant, well  nourised, obese, well groomed                     Cardiovascular: Regular  rate rhythm, no peripheral edema, warm, nontender. Eyes: Conjunctivae clear without exudates or hemorrhage Neck: Supple, no carotid bruise. Pulmonary: Clear to auscultation bilaterally   NEUROLOGICAL EXAM:  MENTAL STATUS: Speech:    Speech is normal; fluent and spontaneous with normal comprehension.  Cognition:    The patient is oriented to person, place, and time;     recent and remote memory intact;     language fluent;     normal attention, concentration,     fund of knowledge.  CRANIAL NERVES: CN II: Visual fields are full to confrontation. Fundoscopic exam is normal with sharp discs and no vascular changes. Venous pulsations are present bilaterally. Pupils are 4 mm and briskly reactive to light CN III, IV, VI: extraocular movement are normal. No ptosis. CN V: Facial sensation is intact to pinprick in all 3 divisions bilaterally. Corneal responses are intact.  CN VII: Face is symmetric with normal eye closure and smile. CN VIII: Hearing is normal to rubbing fingers CN IX, X: Palate elevates symmetrically. Phonation is normal. CN XI: Head turning and shoulder shrug are intact CN XII: Tongue is midline with normal movements and no atrophy.  MOTOR: There is no pronator drift of out-stretched arms. he has mild bilateral intrinsic hand muscle atrophy  Billateral upper extremity proximal muscle strength is normal, he has mild bilateral finger abduction weakness, bilateral lower extremity proximal muscle strength is normal, mild bilateral toe extension, flexion weakness   REFLEXES: Reflexes are  1 and symmetric at the biceps, triceps,  absent at bilateral knees, and ankles. Plantar responses are flexor.  SENSORY:  length dependent decreased light touch, pinprick to knee level, absent positional sensation at bilateral toes , and vibration sense are intact in fingers and decreased at   toes.  COORDINATION: Rapid alternating movements and fine finger movements are intact. There is no dysmetria on finger-to-nose and heel-knee-shin. There are no abnormal or extraneous movements.   GAIT/STANCE: He needs push up to get up from seated position, wide-based, cautious, mildly unsteady, has difficulty standing on heels, was able to stand on tiptoe Romberg is present   DIAGNOSTIC DATA (LABS, IMAGING, TESTING) - I reviewed patient records, labs, notes, testing and imaging myself where available.    ASSESSMENT AND PLAN  KEISHAWN FANELLA is a 82 y.o. male  With past medical history of hypertension, hyperlipidemia, gout, chronic allopurinol treatment, previous cervical, lumbar decompression surgery, left knee replacement,  presenting with gradual onset slow worsening ascending numbness since 2012, on examination, he has  length dependent sensory changes, absent proprioception at bilateral toes, absent bilateral lower extremity deep tendon reflexes,   Lumbar stenosis  Progressive worsening bilateral lower extremity paresthesia, mildly increased gait difficulty   Significant stenosis at L3-4 level, multilevel moderate to severe foraminal stenosis,   He was evaluated by neurosurgeon in 2017, was told no decompression is needed at this point  Peripheral neuropathy  Extensive laboratory evaluation showed elevated A1c 6.1, likely contributed to his bilateral lower extremity paresthesia  Marcial Pacas, M.D. Ph.D.  [facility] [address] [place], [place] [address] Ph: [phone] Fax: [phone]

## 2016-01-24 ENCOUNTER — Ambulatory Visit (INDEPENDENT_AMBULATORY_CARE_PROVIDER_SITE_OTHER): Payer: Medicare HMO | Admitting: Internal Medicine

## 2016-01-24 ENCOUNTER — Encounter: Payer: Self-pay | Admitting: Internal Medicine

## 2016-01-24 VITALS — BP 110/58 | HR 78 | Ht 68.0 in | Wt 178.4 lb

## 2016-01-24 DIAGNOSIS — I119 Hypertensive heart disease without heart failure: Secondary | ICD-10-CM | POA: Diagnosis not present

## 2016-01-24 DIAGNOSIS — I350 Nonrheumatic aortic (valve) stenosis: Secondary | ICD-10-CM

## 2016-01-24 DIAGNOSIS — R55 Syncope and collapse: Secondary | ICD-10-CM | POA: Diagnosis not present

## 2016-01-24 MED ORDER — FUROSEMIDE 20 MG PO TABS: 20.0000 mg | ORAL_TABLET | ORAL | Status: DC

## 2016-01-24 NOTE — Patient Instructions (Signed)
Medication Instructions:  Your physician has recommended you make the following change in your medication:  1) Stop Metoprolol 2) Decrease Furosemide to 20 mg every other day    Labwork: None ordered   Testing/Procedures: None ordered   Follow-Up: Your physician wants you to follow-up in: 12 months with Dr Vallery Ridge will receive a reminder letter in the mail two months in advance. If you don't receive a letter, please call our office to schedule the follow-up appointment.   Any Other Special Instructions Will Be Listed Below (If Applicable).     If you need a refill on your cardiac medications before your next appointment, please call your pharmacy.

## 2016-01-24 NOTE — Progress Notes (Signed)
PCP: [name], MD Primary Cardiologist:  Dr Bronson Ing (previously Dr Ron Parker)  [name] is a 82 y.o. male who presents today for electrophysiology followup.  Since last being seen in our clinic, the patient reports doing reasonably well.  He underwent cath 3/17 for exertional SOB.  He underwent PCI.  His symptoms have not improved.  He also has episodic dizziness and presyncope.  ILR has documented some nocturnal bradycardia but no arrhythmias to correlate with symptoms of SOB or presyncope.  He continues to travel to [place] and enjoys fishing.  Today, he denies symptoms of palpitations, chest pain, lower extremity edema, dizziness, presyncope, or syncope.  The patient is otherwise without complaint today.   Past Medical History  Diagnosis Date  . Hyperlipidemia   . Benign prostatic hypertrophy   . Carotid artery disease ([place])     DES, SVG to circumflex marginal, October, 2010 ( SVG to PDA and PLA patent , LIMA to LAD patent, EF 60%, mild inferior hypokinesis; H/o mild CHF, CABG  . CAD (coronary artery disease)     DES, SVG to circumflex marginal, October, 2010 ( SVG to PDA and PLA patent , LIMA to LAD patent, EF 60%, mild inferior hypokinesis  . Syncope     Remote, etiology unknown  . Psoriasis     severe; with total skin exfoliation in the past resolved  . Hx of decompressive lumbar laminectomy     Dr.Botero December, 2011  . Aortic stenosis     Mild, echo, August, 2012  . HTN (hypertension)     takes meds daily  . Hypothyroidism   . Nephrolithiasis   . GERD (gastroesophageal reflux disease)     takes daily  . Arthritis   . PVC's (premature ventricular contractions)     May, 2014  . Ventricular tachycardia, non-sustained ([place])   . Neuromuscular disorder [facility])    Past Surgical History  Procedure Laterality Date  . Decompression of left median nerve    . Left carotid endarectomy and dacron patch angioplasty]    . Bilateral l3-4 laminectomies    . Removal of synovial cyst     . Posterior arthrodesis with autograft and allograft    . Colonoscopy  11/23/2011    Procedure: COLONOSCOPY;  Surgeon: Rogene Houston, MD;  [facility];  Service: Endoscopy;  Laterality: N/A;  730  . Back surgery      x3  . Tonsillectomy    . Appendectomy    . Total knee arthroplasty  04/16/2012    Procedure: TOTAL KNEE ARTHROPLASTY;  Surgeon: Garald Balding, MD;  Location: [place];  Service: Orthopedics;  Laterality: Left;  . Cataract extraction, bilateral    . Joint replacement Left Sept. 27, 2013    Knee  . Spine surgery      X's 3  . Carotid endarterectomy Left 02-24-08    cea  Dr. Amedeo Plenty  . Carelink remote monitoring  Oct. 29, 2015    by Dr. [name]  . Carpal tunnel release Bilateral 2010  . Left heart catheterization with coronary angiogram N/A 12/27/2012    Procedure: LEFT HEART CATHETERIZATION WITH CORONARY ANGIOGRAM;  Surgeon: Burnell Blanks, MD;  Location: [facility] CATH LAB;  Service: Cardiovascular;  Laterality: N/A;  . Coronary stent placement  11/17/2015    SVG   DES to RCA  . Coronary artery bypass graft  1995  . Cardiac catheterization N/A 11/17/2015    Procedure: Right/Left Heart Cath and Coronary Angiography;  Surgeon: [name]  M Martinique, MD;  Location: [place] CV LAB;  Service: Cardiovascular;  Laterality: N/A;  . Cardiac catheterization N/A 11/17/2015    Procedure: Coronary Stent Intervention;  Surgeon: Peter M Martinique, MD;  Location: [place] CV LAB;  Service: Cardiovascular;  Laterality: N/A;    ROS- all systems are reviewed and negatives except as per HPI above  Current Outpatient Prescriptions  Medication Sig Dispense Refill  . allopurinol (ZYLOPRIM) 300 MG tablet Take 600 mg by mouth daily.     Marland Kitchen aspirin EC 81 MG tablet Take 81 mg by mouth daily.    Marland Kitchen atorvastatin (LIPITOR) 20 MG tablet Take 20 mg by mouth every evening.     . clobetasol cream (TEMOVATE) [id number] % Apply 1 application topically 2 (two) times daily.    . clopidogrel (PLAVIX) 75  MG tablet Take 1 tablet (75 mg total) by mouth daily with breakfast. 90 tablet 3  . diclofenac sodium (VOLTAREN) 1 % GEL Apply 4 g topically 4 (four) times daily. 100 g 11  . fluticasone (FLONASE) 50 MCG/ACT nasal spray Place 1 spray into both nostrils 2 (two) times daily.    . furosemide (LASIX) 20 MG tablet Take 1 tablet (20 mg total) by mouth daily. 90 tablet 1  . latanoprost (XALATAN) 0.005 % ophthalmic solution Place 1 drop into both eyes at bedtime.    Marland Kitchen levothyroxine (SYNTHROID, LEVOTHROID) 112 MCG tablet Take 112 mcg by mouth daily before breakfast.    . multivitamin (THERAGRAN) per tablet Take 1 tablet by mouth daily.     . nitroGLYCERIN (NITROSTAT) 0.4 MG SL tablet Place 0.4 mg under the tongue every 5 (five) minutes x 3 doses as needed. For chest pain    . pantoprazole (PROTONIX) 40 MG tablet Take 1 tablet (40 mg total) by mouth daily. 90 tablet 3  . pyridOXINE (VITAMIN B-6) 100 MG tablet Take 100 mg by mouth daily.     . Tamsulosin HCl (FLOMAX) 0.4 MG CAPS Take 0.4 mg by mouth daily.     . valsartan (DIOVAN) 160 MG tablet Take 1 tablet (160 mg total) by mouth daily. 90 tablet 3   No current facility-administered medications for this visit.    Physical Exam: Filed Vitals:   01/24/16 1538  BP: 110/58  Pulse: 78  Height: 5\' 8"  (1.727 m)  Weight: 178 lb 6.4 oz (80.922 kg)    GEN- The patient is well appearing, alert and oriented x 3 today.   Head- normocephalic, atraumatic Eyes-  Sclera clear, conjunctiva pink Ears- hearing intact Oropharynx- clear Lungs- Clear to ausculation bilaterally, normal work of breathing Heart- Regular rate and rhythm, 2/.6 SEM LUSB (late peaking) GI- soft, NT, ND, + BS Extremities- no clubbing, cyanosis, or edema Minor ecchymosis over his ILR implant site  ILR interrogation is reviewed today Echo and cath reviewed  Assessment and Plan:  1. H/o syncope and transient second degree AV block (noctural, without symptoms) No syncope since I saw  him last. No arrhythmias to correlate with symptoms by ILR review today Will stop metoprolol at this time Also given postural dizziness, will reduce lasix to 20mg  QOD Would consider advanced aortic stenosis as a probable cause for dyspnea and dizziness.  Will defer further eval and management to Dr Bronson Ing.  Would be reasonable to consider referral to Dr Burt Knack for TAVR.  We will continue to monitor very closely with his ILR device.  2. Hypertensive cardiovascular disease Stable No change required today Reduce lasix and stop metoprolol as above  Continue carelink remote monitoring Follow-up with Dr Bronson Ing as scheduled I will see again in 55 months  [name] MD, [facility] 01/24/2016 4:12 PM

## 2016-01-24 NOTE — Progress Notes (Signed)
Carelink Summary Report / Loop Recorder 

## 2016-01-27 DIAGNOSIS — R69 Illness, unspecified: Secondary | ICD-10-CM | POA: Diagnosis not present

## 2016-01-28 LAB — CUP PACEART REMOTE DEVICE CHECK: Date Time Interrogation Session: 20170501210711

## 2016-02-04 ENCOUNTER — Telehealth: Payer: Self-pay | Admitting: *Deleted

## 2016-02-04 ENCOUNTER — Encounter: Payer: Self-pay | Admitting: Internal Medicine

## 2016-02-04 NOTE — Telephone Encounter (Signed)
Patient returned call.  Assisted patient in sending a manual transmission from his home monitor.  Transmission successfully received.  Patient denies symptoms with recent tachy episodes.  He reports that his symptoms during symptom episodes are weakness and slight dizziness.  He denies loss of consciousness.  He is always moving/changing position during these episodes.  Will review episodes with Dr. [name] and call patient back.  He is agreeable to this plan.

## 2016-02-04 NOTE — Telephone Encounter (Signed)
[facility] requesting call back.  Gave device clinic phone number.  Will request that patient send a manual transmission for further review of symptom and tachy episodes and to discuss any symptoms during these episodes.

## 2016-02-04 NOTE — Telephone Encounter (Signed)
Called patient to make him aware that Dr. [name] recommends continuing to monitor for the time being.  Advised that there was no arrhythmia noted during patient's symptom episodes.  Patient verbalizes understanding and is appreciative of assistance.  He is aware to call with worsening symptoms, questions, or concerns.  Patient denies additional questions at this time.

## 2016-02-18 ENCOUNTER — Ambulatory Visit (INDEPENDENT_AMBULATORY_CARE_PROVIDER_SITE_OTHER): Payer: Medicare HMO | Admitting: *Deleted

## 2016-02-18 DIAGNOSIS — R55 Syncope and collapse: Secondary | ICD-10-CM | POA: Diagnosis not present

## 2016-02-21 NOTE — Progress Notes (Signed)
Carelink Summary Report / Loop Recorder 

## 2016-02-28 ENCOUNTER — Telehealth: Payer: Self-pay | Admitting: *Deleted

## 2016-02-28 ENCOUNTER — Encounter: Payer: Self-pay | Admitting: Internal Medicine

## 2016-02-28 LAB — CUP PACEART REMOTE DEVICE CHECK
Date Time Interrogation Session: 20170630223654
MDC IDC SESS DTM: 20170531213856

## 2016-02-28 NOTE — Telephone Encounter (Signed)
LMOM to call back to device clinic. Symptom episode on LINQ 02/27/16.

## 2016-02-28 NOTE — Telephone Encounter (Signed)
Mr. Grima calling back. He says that yesterday morning he used his symptom activator due to feeling like he was going to pass out. He says that he got out of the car and walked inside quickly and he might have been going to fast. He reports that he knows he should slow down. He says that he has been staying out of the heat and has been drinking plenty of water. He has an appointment with Dr. Bronson Ing 03/07/16.  ECG recording shows SR with PVCs. Episode placed in Dr. Jackalyn Lombard folder for review.

## 2016-03-01 ENCOUNTER — Encounter: Payer: Self-pay | Admitting: Internal Medicine

## 2016-03-01 ENCOUNTER — Telehealth: Payer: Self-pay | Admitting: *Deleted

## 2016-03-01 NOTE — Telephone Encounter (Signed)
[facility] requesting call back, gave device clinic phone number.  Will discuss symptom episodes on 7/9 and 7/11 transmitted via Carelink when patient returns call.

## 2016-03-01 NOTE — Telephone Encounter (Signed)
Patient returned call.  He reports that he has already discussed symptoms from the 7/9 episode (see phone note from Debroah Loop, [place]), but that during the 7/11 episode he was walking on the treadmill and suddenly felt that he needed to stop.  He denies dizziness during the episode, and states that the sensation is best described as he felt like he was "going down".  Patient reports that the sensation passed after a few minutes and he was able to resume exercise.  Requested that patient send a manual transmission for review.  He plans to send later today.  Patient is aware to call with worsening symptoms, questions, or concerns.

## 2016-03-03 NOTE — Telephone Encounter (Signed)
Called patient to advise that Dr. [name] reviewed the episode ECGs and recommended continuing to monitor via his ILR at this time.  Patient has many questions about what could be causing his symptoms, particularly during exercise, but advised that per Dr. [name], he is deferring further evaluation to Dr. Bronson Ing.  Patient has an appointment with Dr. Bronson Ing on 03/07/16.  Encouraged patient to discuss his concerns at this appointment.  Patient is agreeable to this plan and is appreciative.  He is aware to call our office with worsening symptoms, questions, or concerns.

## 2016-03-07 ENCOUNTER — Encounter: Payer: Self-pay | Admitting: Cardiovascular Disease

## 2016-03-07 ENCOUNTER — Ambulatory Visit (INDEPENDENT_AMBULATORY_CARE_PROVIDER_SITE_OTHER): Payer: Medicare HMO | Admitting: Cardiovascular Disease

## 2016-03-07 VITALS — BP 126/67 | HR 82 | Ht 68.0 in | Wt 180.0 lb

## 2016-03-07 DIAGNOSIS — R55 Syncope and collapse: Secondary | ICD-10-CM | POA: Diagnosis not present

## 2016-03-07 DIAGNOSIS — R0600 Dyspnea, unspecified: Secondary | ICD-10-CM

## 2016-03-07 DIAGNOSIS — E785 Hyperlipidemia, unspecified: Secondary | ICD-10-CM

## 2016-03-07 DIAGNOSIS — I10 Essential (primary) hypertension: Secondary | ICD-10-CM | POA: Diagnosis not present

## 2016-03-07 DIAGNOSIS — I25768 Atherosclerosis of bypass graft of coronary artery of transplanted heart with other forms of angina pectoris: Secondary | ICD-10-CM

## 2016-03-07 DIAGNOSIS — I119 Hypertensive heart disease without heart failure: Secondary | ICD-10-CM

## 2016-03-07 DIAGNOSIS — I350 Nonrheumatic aortic (valve) stenosis: Secondary | ICD-10-CM

## 2016-03-07 DIAGNOSIS — I739 Peripheral vascular disease, unspecified: Secondary | ICD-10-CM

## 2016-03-07 DIAGNOSIS — I779 Disorder of arteries and arterioles, unspecified: Secondary | ICD-10-CM

## 2016-03-07 DIAGNOSIS — R531 Weakness: Secondary | ICD-10-CM

## 2016-03-07 DIAGNOSIS — R0609 Other forms of dyspnea: Secondary | ICD-10-CM

## 2016-03-07 MED ORDER — VALSARTAN 80 MG PO TABS: 80.0000 mg | ORAL_TABLET | Freq: Every day | ORAL | Status: DC

## 2016-03-07 NOTE — Patient Instructions (Signed)
Medication Instructions:   Decrease Diovan to 80mg  daily - may take 1/2 tab of the 160mg  tablet till finish current supply.  New 80mg  printed script given today.  Continue all other medications.    Labwork: NONE  Testing/Procedures:  Your physician has requested that you have an echocardiogram. Echocardiography is a painless test that uses sound waves to create images of your heart. It provides your doctor with information about the size and shape of your heart and how well your heart's chambers and valves are working. This procedure takes approximately one hour. There are no restrictions for this procedure.  Office will contact with results via phone or letter.    Follow-Up:  2 months   Any Other Special Instructions Will Be Listed Below (If Applicable).  If you need a refill on your cardiac medications before your next appointment, please call your pharmacy.

## 2016-03-07 NOTE — Progress Notes (Signed)
Patient ID: CASPIAN STALDER, male   DOB: 1931-01-26, 82 y.o.   MRN: [id number]      SUBJECTIVE: The patient presents for follow-up of coronary artery disease and aortic stenosis. He underwent coronary angiography on 11/17/15 which demonstrated a 90% stenosis in the SVG to the RCA for which he went drug-eluting stent placement. He had mild to moderate aortic stenosis with a mean gradient of 18 mmHg, 1.8 cm valve area by cath data.  Echocardiogram on 11/02/15 demonstrated moderate to severe stenosis with grade 2 diastolic dysfunction, mean gradient 19 mmHg.  He said he has passed out at least with 3 times since his last visit with me. He experiences exertional dyspnea after walking 50 yards. He denies chest pain. He said symptoms are worse after eating. He said he obtained the blood work ordered in May but I do not have a copy of this. He checks his blood pressure and heart rate at home and the lowest the systolic blood pressure has gotten is 90. He tried not taking his Lasix daily but accumulated leg and ankle swelling.   Review of Systems: As per "subjective", otherwise negative.  Allergies  Allergen Reactions  . Propylene Glycol Hives and Rash    Current Outpatient Prescriptions  Medication Sig Dispense Refill  . allopurinol (ZYLOPRIM) 300 MG tablet Take 600 mg by mouth daily.     Marland Kitchen aspirin EC 81 MG tablet Take 81 mg by mouth daily.    Marland Kitchen atorvastatin (LIPITOR) 20 MG tablet Take 20 mg by mouth every evening.     . clobetasol cream (TEMOVATE) [id number] % Apply 1 application topically 2 (two) times daily.    . clopidogrel (PLAVIX) 75 MG tablet Take 1 tablet (75 mg total) by mouth daily with breakfast. 90 tablet 3  . diclofenac sodium (VOLTAREN) 1 % GEL Apply 4 g topically 4 (four) times daily. 100 g 11  . fluticasone (FLONASE) 50 MCG/ACT nasal spray Place 1 spray into both nostrils 2 (two) times daily.    . furosemide (LASIX) 20 MG tablet Take 20 mg by mouth daily.    Marland Kitchen latanoprost (XALATAN)  0.005 % ophthalmic solution Place 1 drop into both eyes at bedtime.    Marland Kitchen levothyroxine (SYNTHROID, LEVOTHROID) 112 MCG tablet Take 112 mcg by mouth daily before breakfast.    . multivitamin (THERAGRAN) per tablet Take 1 tablet by mouth daily.     . nitroGLYCERIN (NITROSTAT) 0.4 MG SL tablet Place 0.4 mg under the tongue every 5 (five) minutes x 3 doses as needed. For chest pain    . pantoprazole (PROTONIX) 40 MG tablet Take 1 tablet (40 mg total) by mouth daily. 90 tablet 3  . pyridOXINE (VITAMIN B-6) 100 MG tablet Take 100 mg by mouth daily.     . Tamsulosin HCl (FLOMAX) 0.4 MG CAPS Take 0.4 mg by mouth daily.     . valsartan (DIOVAN) 160 MG tablet Take 1 tablet (160 mg total) by mouth daily. 90 tablet 3   No current facility-administered medications for this visit.    Past Medical History  Diagnosis Date  . Hyperlipidemia   . Benign prostatic hypertrophy   . Carotid artery disease ([place])     DES, SVG to circumflex marginal, October, 2010 ( SVG to PDA and PLA patent , LIMA to LAD patent, EF 60%, mild inferior hypokinesis; H/o mild CHF, CABG  . CAD (coronary artery disease)     DES, SVG to circumflex marginal, October, 2010 ( SVG to PDA and  PLA patent , LIMA to LAD patent, EF 60%, mild inferior hypokinesis  . Syncope     Remote, etiology unknown  . Psoriasis     severe; with total skin exfoliation in the past resolved  . Hx of decompressive lumbar laminectomy     Dr.Botero December, 2011  . Aortic stenosis     Mild, echo, August, 2012  . HTN (hypertension)     takes meds daily  . Hypothyroidism   . Nephrolithiasis   . GERD (gastroesophageal reflux disease)     takes daily  . Arthritis   . PVC's (premature ventricular contractions)     May, 2014  . Ventricular tachycardia, non-sustained ([place])   . Neuromuscular disorder [facility])     Past Surgical History  Procedure Laterality Date  . Decompression of left median nerve    . Left carotid endarectomy and dacron patch angioplasty]     . Bilateral l3-4 laminectomies    . Removal of synovial cyst    . Posterior arthrodesis with autograft and allograft    . Colonoscopy  11/23/2011    Procedure: COLONOSCOPY;  Surgeon: Rogene Houston, MD;  [facility];  Service: Endoscopy;  Laterality: N/A;  730  . Back surgery      x3  . Tonsillectomy    . Appendectomy    . Total knee arthroplasty  04/16/2012    Procedure: TOTAL KNEE ARTHROPLASTY;  Surgeon: Garald Balding, MD;  Location: [place];  Service: Orthopedics;  Laterality: Left;  . Cataract extraction, bilateral    . Joint replacement Left Sept. 27, 2013    Knee  . Spine surgery      X's 3  . Carotid endarterectomy Left 02-24-08    cea  Dr. Amedeo Plenty  . Carelink remote monitoring  Oct. 29, 2015    by Dr. [name]  . Carpal tunnel release Bilateral 2010  . Left heart catheterization with coronary angiogram N/A 12/27/2012    Procedure: LEFT HEART CATHETERIZATION WITH CORONARY ANGIOGRAM;  Surgeon: Burnell Blanks, MD;  Location: [facility] CATH LAB;  Service: Cardiovascular;  Laterality: N/A;  . Coronary stent placement  11/17/2015    SVG   DES to RCA  . Coronary artery bypass graft  1995  . Cardiac catheterization N/A 11/17/2015    Procedure: Right/Left Heart Cath and Coronary Angiography;  Surgeon: Peter M Martinique, MD;  Location: [place] CV LAB;  Service: Cardiovascular;  Laterality: N/A;  . Cardiac catheterization N/A 11/17/2015    Procedure: Coronary Stent Intervention;  Surgeon: Peter M Martinique, MD;  Location: [place] CV LAB;  Service: Cardiovascular;  Laterality: N/A;    Social History   Social History  . Marital Status: Married    Spouse Name: N/A  . Number of Children: 2  . Years of Education: 16   Occupational History  . Retired    Social History Main Topics  . Smoking status: Never Smoker   . Smokeless tobacco: Never Used  . Alcohol Use: No     Comment: One beer per week.  . Drug Use: No  . Sexual Activity: Not Currently   Other Topics  Concern  . Not on file   Social History Narrative   Lives at home with wife.   1 cup coffee per day.   Right-handed.     Filed Vitals:   03/07/16 1537  BP: 126/67  Pulse: 82  Height: 5\' 8"  (1.727 m)  Weight: 180 lb (81.647 kg)    PHYSICAL  EXAM General: NAD HEENT: Normal. Neck: No JVD, no thyromegaly. Lungs: Clear to auscultation bilaterally with normal respiratory effort. CV: Nondisplaced PMI. Regular rate and rhythm, normal S1/diminished S2, no S3/S4, harsh III/VI late-peaking, ejection systolic murmur. No pretibial or periankle edema.   Abdomen: Soft, nontender, no distention.  Neurologic: Alert and oriented x 3.  Psych: Normal affect. Skin: Normal. Musculoskeletal: No gross deformities. Extremities: No clubbing or cyanosis.     ECG: Most recent ECG reviewed.      ASSESSMENT AND PLAN: 1. CAD s/p CABG s/p DES to SVG->PDA: Continue dual antiplatelet therapy for one year. Continue statin and metoprolol.   2. DOE and syncope/Mild to moderate aortic stenosis by cath/moderate to severe by echo 11/02/15: Previously discussed eventual need for valve replacement, likely via the transcatheter approach. Continue Lasix 20 mg daily for ankle swelling and orthopnea. Will repeat echocardiogram.  3. Syncope with implantable loop recorder: Interrogation from 02/2016 reviewed. Has had recurrent episodes. Continue to monitor. Followed by EP. Will obtain echo and reduce Diovan to 80 mg.  4. S/p left CEA in 2009: Dopplers from 06/2015 previously reviewed. Continue ASA and statin therapy. Followed by vascular surgery.  5. Hyperlipidemia: On Lipitor 20 mg.  6. Essential HTN: Controlled. Given recurrent syncope, will reduce Diovan to 80 mg.  Dispo: fu 2 months.  Kate Sable, M.D., F.A.C.C.

## 2016-03-20 ENCOUNTER — Encounter: Payer: Self-pay | Admitting: Internal Medicine

## 2016-03-20 ENCOUNTER — Ambulatory Visit (INDEPENDENT_AMBULATORY_CARE_PROVIDER_SITE_OTHER): Payer: Medicare HMO | Admitting: *Deleted

## 2016-03-20 DIAGNOSIS — R55 Syncope and collapse: Secondary | ICD-10-CM | POA: Diagnosis not present

## 2016-03-20 NOTE — Progress Notes (Signed)
Carelink Summary Report / Loop Recorder 

## 2016-03-22 ENCOUNTER — Telehealth: Payer: Self-pay | Admitting: *Deleted

## 2016-03-22 NOTE — Telephone Encounter (Signed)
Called patient to make him aware that his symptom episode from 03/18/16 suggests SR w/PVCs, consistent with previous symptom episodes.  Patient reports feeling "faint" during this episode, similar to previous episodes.  Patient is appreciative of call and is aware that Dr. [name] recommends that we continue to monitor his ILR remotely.  Patient is also aware of upcoming echo scheduled for 03/29/16.

## 2016-03-23 ENCOUNTER — Encounter: Payer: Self-pay | Admitting: Internal Medicine

## 2016-03-23 ENCOUNTER — Telehealth: Payer: Self-pay | Admitting: *Deleted

## 2016-03-23 NOTE — Telephone Encounter (Signed)
Spoke to patient about tachy episode recorded on his ILR from 8/2 @ 0533. Patient states that he was getting ready to go to the gym when all of a sudden he felt like "he better sit down before he fall down". Patient states that his sx's resolved after roughly 4-5 mins. Patient denies any recent CP or other sx's.   Will inform Dr.Allred about episode and notify patient with any further recommendations.

## 2016-03-26 NOTE — Telephone Encounter (Signed)
Please schedule to see me at next available time.

## 2016-03-28 NOTE — Telephone Encounter (Signed)
Dr. [name] recommends follow up with him ASAP. I have left [name] a message that scheduling will call to arrange an appointment. Gave device clinic # if he needs to call back.

## 2016-03-29 ENCOUNTER — Ambulatory Visit (INDEPENDENT_AMBULATORY_CARE_PROVIDER_SITE_OTHER): Payer: Medicare HMO

## 2016-03-29 ENCOUNTER — Other Ambulatory Visit: Payer: Self-pay

## 2016-03-29 DIAGNOSIS — I350 Nonrheumatic aortic (valve) stenosis: Secondary | ICD-10-CM | POA: Diagnosis not present

## 2016-04-04 ENCOUNTER — Ambulatory Visit: Payer: Medicare HMO | Admitting: Cardiovascular Disease

## 2016-04-04 ENCOUNTER — Telehealth: Payer: Self-pay | Admitting: *Deleted

## 2016-04-04 NOTE — Telephone Encounter (Signed)
Notes Recorded by Laurine Blazer, LPN on [id number] at [id number] AM EDT Patient notified. Copy to pmd. ------  Notes Recorded by Herminio Commons, MD on 03/29/2016 at 12:04 PM EDT Pumping function overall low normal. Calcium buildup on aortic valve is moderate. Will repeat in one year.

## 2016-04-05 ENCOUNTER — Encounter: Payer: Self-pay | Admitting: Internal Medicine

## 2016-04-05 ENCOUNTER — Ambulatory Visit (INDEPENDENT_AMBULATORY_CARE_PROVIDER_SITE_OTHER): Payer: Medicare HMO | Admitting: Internal Medicine

## 2016-04-05 VITALS — BP 118/60 | HR 89 | Ht 68.0 in | Wt 179.8 lb

## 2016-04-05 DIAGNOSIS — I119 Hypertensive heart disease without heart failure: Secondary | ICD-10-CM

## 2016-04-05 DIAGNOSIS — R55 Syncope and collapse: Secondary | ICD-10-CM

## 2016-04-05 DIAGNOSIS — I350 Nonrheumatic aortic (valve) stenosis: Secondary | ICD-10-CM

## 2016-04-05 NOTE — Patient Instructions (Addendum)
Medication Instructions:  Your physician recommends that you continue on your current medications as directed. Please refer to the Current Medication list given to you today.   Labwork: None ordered   Testing/Procedures: Your physician has requested that you have an exercise tolerance test. For further information please visit [email or web address]. Please also follow instruction sheet, as given.      Follow-Up: Your physician recommends that you schedule a follow-up appointment to be determined after GXT    Any Other Special Instructions Will Be Listed Below (If Applicable).     If you need a refill on your cardiac medications before your next appointment, please call your pharmacy.

## 2016-04-07 NOTE — Progress Notes (Signed)
PCP: [name], MD Primary Cardiologist:  Dr Bronson Ing (previously Dr Ron Parker)  [name] is a 82 y.o. male who presents today for electrophysiology followup.  Since last being seen in our clinic, the patient reports doing reasonably well.  He has had 3 additional episodes of exertional syncope since I saw him in June.  He also has frequent exertional SOB.  ILR has documented some nocturnal bradycardia as well as NSVT. but no arrhythmias to correlate with symptoms of SOB or presyncope. He was recently evaluated by Dr Bronson Ing.  Echo was felt to show moderate AS. Today, he denies symptoms of palpitations, chest pain, or lower extremity edema.  The patient is otherwise without complaint today.   Past Medical History:  Diagnosis Date  . Aortic stenosis    Mild, echo, August, 2012  . Arthritis   . Benign prostatic hypertrophy   . CAD (coronary artery disease)    DES, SVG to circumflex marginal, October, 2010 ( SVG to PDA and PLA patent , LIMA to LAD patent, EF 60%, mild inferior hypokinesis  . Carotid artery disease ([place])    DES, SVG to circumflex marginal, October, 2010 ( SVG to PDA and PLA patent , LIMA to LAD patent, EF 60%, mild inferior hypokinesis; H/o mild CHF, CABG  . GERD (gastroesophageal reflux disease)    takes daily  . HTN (hypertension)    takes meds daily  . Hx of decompressive lumbar laminectomy    Dr.Botero December, 2011  . Hyperlipidemia   . Hypothyroidism   . Nephrolithiasis   . Neuromuscular disorder ([place])   . Psoriasis    severe; with total skin exfoliation in the past resolved  . PVC's (premature ventricular contractions)    May, 2014  . Syncope    Remote, etiology unknown  . Ventricular tachycardia, non-sustained [facility])    Past Surgical History:  Procedure Laterality Date  . APPENDECTOMY    . BACK SURGERY     x3  . bilateral L3-4 laminectomies    . CARDIAC CATHETERIZATION N/A 11/17/2015   Procedure: Right/Left Heart Cath and Coronary Angiography;   Surgeon: Peter M Martinique, MD;  Location: [place] CV LAB;  Service: Cardiovascular;  Laterality: N/A;  . CARDIAC CATHETERIZATION N/A 11/17/2015   Procedure: Coronary Stent Intervention;  Surgeon: Peter M Martinique, MD;  Location: [place] CV LAB;  Service: Cardiovascular;  Laterality: N/A;  . Carelink Remote Monitoring  Oct. 29, 2015   by Dr. [name]  . CAROTID ENDARTERECTOMY Left 02-24-08   cea  Dr. Amedeo Plenty  . CARPAL TUNNEL RELEASE Bilateral 2010  . CATARACT EXTRACTION, BILATERAL    . COLONOSCOPY  11/23/2011   Procedure: COLONOSCOPY;  Surgeon: Rogene Houston, MD;  [facility];  Service: Endoscopy;  Laterality: N/A;  730  . CORONARY ARTERY BYPASS GRAFT  1995  . CORONARY STENT PLACEMENT  11/17/2015   SVG   DES to RCA  . decompression of left median nerve    . JOINT REPLACEMENT Left Sept. 27, 2013   Knee  . left carotid endarectomy and Dacron patch angioplasty]    . LEFT HEART CATHETERIZATION WITH CORONARY ANGIOGRAM N/A 12/27/2012   Procedure: LEFT HEART CATHETERIZATION WITH CORONARY ANGIOGRAM;  Surgeon: Burnell Blanks, MD;  Location[facility] CATH LAB;  Service: Cardiovascular;  Laterality: N/A;  . posterior arthrodesis with autograft and allograft    . removal of synovial cyst    . SPINE SURGERY     X's 3  . TONSILLECTOMY    .  TOTAL KNEE ARTHROPLASTY  04/16/2012   Procedure: TOTAL KNEE ARTHROPLASTY;  Surgeon: Garald Balding, MD;  Location: [place];  Service: Orthopedics;  Laterality: Left;    ROS- all systems are reviewed and negatives except as per HPI above  Current Outpatient Prescriptions  Medication Sig Dispense Refill  . allopurinol (ZYLOPRIM) 300 MG tablet Take 600 mg by mouth daily.     Marland Kitchen aspirin EC 81 MG tablet Take 81 mg by mouth daily.    Marland Kitchen atorvastatin (LIPITOR) 20 MG tablet Take 20 mg by mouth every evening.     . clobetasol cream (TEMOVATE) [id number] % Apply 1 application topically 2 (two) times daily.    . clopidogrel (PLAVIX) 75 MG tablet Take 1 tablet (75  mg total) by mouth daily with breakfast. 90 tablet 3  . diclofenac sodium (VOLTAREN) 1 % GEL Apply 4 g topically 4 (four) times daily. 100 g 11  . fluticasone (FLONASE) 50 MCG/ACT nasal spray Place 1 spray into both nostrils 2 (two) times daily.    . furosemide (LASIX) 20 MG tablet Take 20 mg by mouth daily.    Marland Kitchen latanoprost (XALATAN) 0.005 % ophthalmic solution Place 1 drop into both eyes at bedtime.    Marland Kitchen levothyroxine (SYNTHROID, LEVOTHROID) 112 MCG tablet Take 112 mcg by mouth daily before breakfast.    . multivitamin (THERAGRAN) per tablet Take 1 tablet by mouth daily.     . nitroGLYCERIN (NITROSTAT) 0.4 MG SL tablet Place 0.4 mg under the tongue every 5 (five) minutes x 3 doses as needed. For chest pain    . pyridOXINE (VITAMIN B-6) 100 MG tablet Take 100 mg by mouth daily.     . Tamsulosin HCl (FLOMAX) 0.4 MG CAPS Take 0.4 mg by mouth daily.     . valsartan (DIOVAN) 80 MG tablet Take 1 tablet (80 mg total) by mouth daily. 30 tablet 6   No current facility-administered medications for this visit.     Physical Exam: Vitals:   04/05/16 1651  BP: 118/60  Pulse: 89  Weight: 179 lb 12.8 oz (81.6 kg)  Height: 5\' 8"  (1.727 m)    GEN- The patient is well appearing, alert and oriented x 3 today.   Head- normocephalic, atraumatic Eyes-  Sclera clear, conjunctiva pink Ears- hearing intact Oropharynx- clear Lungs- Clear to ausculation bilaterally, normal work of breathing Heart- Regular rate and rhythm, 2/.6 SEM LUSB (late peaking) GI- soft, NT, ND, + BS Extremities- no clubbing, cyanosis, or edema Minor ecchymosis over his ILR implant site  ILR interrogation is reviewed today Echo and cath reviewed,  Dr Raylene Everts note is also reviewed  Assessment and Plan:  1. H/o syncope and transient second degree AV block (noctural, without symptoms)  I find his exertional syncope very troublesome.  His history certainly sounds consistent with severe AS or an ischemic cause though his echo  and cath are more suggestive of moderate AS.  I have spoken with Dr Burt Knack at length about the patient today.  I think that the best next step is to proceed with exercise treadmill test to evaluate exertional SOB and syncope.  I have not made any changes today. We will continue to monitor very closely with his ILR device.  No driving  2. Hypertensive cardiovascular disease Stable No change required today  Continue carelink remote monitoring Follow-up with Dr Bronson Ing as scheduled I will schedule for ETT at the earliest time.  Today, I have spent 40 minutes with the patient discussing exertional syncope.  More than 50% of the visit time today was spent on this issue.    [name] MD, [facility]

## 2016-04-11 ENCOUNTER — Encounter: Payer: Self-pay | Admitting: Internal Medicine

## 2016-04-12 ENCOUNTER — Ambulatory Visit (INDEPENDENT_AMBULATORY_CARE_PROVIDER_SITE_OTHER): Payer: Medicare HMO

## 2016-04-12 DIAGNOSIS — R55 Syncope and collapse: Secondary | ICD-10-CM

## 2016-04-14 ENCOUNTER — Ambulatory Visit (INDEPENDENT_AMBULATORY_CARE_PROVIDER_SITE_OTHER): Payer: Medicare HMO | Admitting: Cardiovascular Disease

## 2016-04-14 ENCOUNTER — Encounter: Payer: Self-pay | Admitting: Cardiovascular Disease

## 2016-04-14 VITALS — BP 118/68 | HR 74 | Ht 67.5 in | Wt 178.6 lb

## 2016-04-14 DIAGNOSIS — I350 Nonrheumatic aortic (valve) stenosis: Secondary | ICD-10-CM | POA: Diagnosis not present

## 2016-04-14 LAB — CBC
HCT: 33.2 % — ABNORMAL LOW (ref 38.5–50.0)
HEMOGLOBIN: 11.7 g/dL — AB (ref 13.2–17.1)
MCH: 32.3 pg (ref 27.0–33.0)
MCHC: 35.2 g/dL (ref 32.0–36.0)
MCV: 91.7 fL (ref 80.0–100.0)
MPV: 9.6 fL (ref 7.5–12.5)
Platelets: 182 10*3/uL (ref 140–400)
RBC: 3.62 MIL/uL — AB (ref 4.20–5.80)
RDW: 14.3 % (ref 11.0–15.0)
WBC: 7.2 10*3/uL (ref 3.8–10.8)

## 2016-04-14 LAB — BASIC METABOLIC PANEL
BUN: 19 mg/dL (ref 7–25)
CALCIUM: 9.6 mg/dL (ref 8.6–10.3)
CHLORIDE: 98 mmol/L (ref 98–110)
CO2: 28 mmol/L (ref 20–31)
CREATININE: 0.95 mg/dL (ref 0.70–1.11)
GLUCOSE: 104 mg/dL — AB (ref 65–99)
Potassium: 4.2 mmol/L (ref 3.5–5.3)
SODIUM: 138 mmol/L (ref 135–146)

## 2016-04-14 LAB — PROTIME-INR
INR: 1.1
PROTHROMBIN TIME: 11.3 s (ref 9.0–11.5)

## 2016-04-14 NOTE — Progress Notes (Signed)
Cardiology Office Note Date:  04/14/2016   ID:  [name], DOB Jun 29, 1931, MRN [id number]  PCP:  Daniel Noble, MD  Cardiologist:  [name], MD    Chief Complaint  Patient presents with  . Advice Only    eval for TAVR   History of Present Illness: [name] is a 82 y.o. male who presents for evaluation of aortic stenosis. The patient has coronary artery disease and underwent CABG in 1995. He has undergone saphenous vein graft PCI on 2 occasions, most recently in March 2017. He has been noted to have moderate aortic stenosis.  The patient is referred for evaluation of aortic stenosis. He's been followed for moderate aortic stenosis, but has had episodes of exertional syncope. He has undergone fairly recent evaluation with echocardiography, cardiac catheterization, and exercise stress testing.  The patient states "if I get in a hurry and walk out of my house fast, I get short of breath and have passed out several times." States symptoms are worse if he has recently eaten. Some days he can do the same amount of activity and has not symptoms, but other days he is short of breath with activity. He has been extremely active over the years and normally would be able to walk an unlimited amount, but now states that 'I just know something is wrong.' Now with walking 1/8 mile he has to stop and rest a few times because of exertional dyspnea.   After his most recent PCI in March 2017, he did not appreciate any improvement in symptoms. Thinks he has had approximately 5 syncopal episodes over the past 1-2 years. No positional symptoms. No chest pain, edema, orthopnea, or PND.   Past Medical History:  Diagnosis Date  . Aortic stenosis    Mild, echo, August, 2012  . Arthritis   . Benign prostatic hypertrophy   . CAD (coronary artery disease)    DES, SVG to circumflex marginal, October, 2010 ( SVG to PDA and PLA patent , LIMA to LAD patent, EF 60%, mild inferior hypokinesis  . Carotid  artery disease ([place])    DES, SVG to circumflex marginal, October, 2010 ( SVG to PDA and PLA patent , LIMA to LAD patent, EF 60%, mild inferior hypokinesis; H/o mild CHF, CABG  . GERD (gastroesophageal reflux disease)    takes daily  . HTN (hypertension)    takes meds daily  . Hx of decompressive lumbar laminectomy    Dr.Botero December, 2011  . Hyperlipidemia   . Hypothyroidism   . Nephrolithiasis   . Neuromuscular disorder ([place])   . Psoriasis    severe; with total skin exfoliation in the past resolved  . PVC's (premature ventricular contractions)    May, 2014  . Syncope    Remote, etiology unknown  . Ventricular tachycardia, non-sustained [facility])     Past Surgical History:  Procedure Laterality Date  . APPENDECTOMY    . BACK SURGERY     x3  . bilateral L3-4 laminectomies    . CARDIAC CATHETERIZATION N/A 11/17/2015   Procedure: Right/Left Heart Cath and Coronary Angiography;  Surgeon: Peter M Martinique, MD;  Location: [place] [facility];  Service: Cardiovascular;  Laterality: N/A;  . CARDIAC CATHETERIZATION N/A 11/17/2015   Procedure: Coronary Stent Intervention;  Surgeon: Peter M Martinique, MD;  Location: [place] [facility];  Service: Cardiovascular;  Laterality: N/A;  . [facility] Remote Monitoring  Oct. 29, 2015   by Dr. [name]  . CAROTID ENDARTERECTOMY Left 02-24-08  cea  Dr. Amedeo Plenty  . CARPAL TUNNEL RELEASE Bilateral 2010  . CATARACT EXTRACTION, BILATERAL    . COLONOSCOPY  11/23/2011   Procedure: COLONOSCOPY;  Surgeon: Rogene Houston, MD;  [facility];  Service: Endoscopy;  Laterality: N/A;  730  . CORONARY ARTERY BYPASS GRAFT  1995  . CORONARY STENT PLACEMENT  11/17/2015   SVG   DES to RCA  . decompression of left median nerve    . JOINT REPLACEMENT Left Sept. 27, 2013   Knee  . left carotid endarectomy and Dacron patch angioplasty]    . LEFT HEART CATHETERIZATION WITH CORONARY ANGIOGRAM N/A 12/27/2012   Procedure: LEFT HEART CATHETERIZATION WITH CORONARY  ANGIOGRAM;  Surgeon: Burnell Blanks, MD;  Location: [facility] CATH LAB;  Service: Cardiovascular;  Laterality: N/A;  . posterior arthrodesis with autograft and allograft    . removal of synovial cyst    . SPINE SURGERY     X's 3  . TONSILLECTOMY    . TOTAL KNEE ARTHROPLASTY  04/16/2012   Procedure: TOTAL KNEE ARTHROPLASTY;  Surgeon: Garald Balding, MD;  Location: [place];  Service: Orthopedics;  Laterality: Left;    Current Outpatient Prescriptions  Medication Sig Dispense Refill  . allopurinol (ZYLOPRIM) 300 MG tablet Take 600 mg by mouth daily.     Marland Kitchen aspirin EC 81 MG tablet Take 81 mg by mouth daily.    Marland Kitchen atorvastatin (LIPITOR) 20 MG tablet Take 20 mg by mouth every evening.     . clobetasol cream (TEMOVATE) [id number] % Apply 1 application topically 2 (two) times daily.    . clopidogrel (PLAVIX) 75 MG tablet Take 1 tablet (75 mg total) by mouth daily with breakfast. 90 tablet 3  . diclofenac sodium (VOLTAREN) 1 % GEL Apply 4 g topically 4 (four) times daily. (Patient not taking: Reported on 04/14/2016) 100 g 11  . Ferrous Sulfate (IRON) 325 (65 Fe) MG TABS Take 1 tablet by mouth daily.    . fluticasone (FLONASE) 50 MCG/ACT nasal spray Place 1 spray into both nostrils 2 (two) times daily.    . furosemide (LASIX) 20 MG tablet Take 20 mg by mouth daily.    Marland Kitchen latanoprost (XALATAN) 0.005 % ophthalmic solution Place 1 drop into both eyes at bedtime.    Marland Kitchen levothyroxine (SYNTHROID, LEVOTHROID) 112 MCG tablet Take 112 mcg by mouth daily before breakfast.    . multivitamin (THERAGRAN) per tablet Take 1 tablet by mouth daily.     . nitroGLYCERIN (NITROSTAT) 0.4 MG SL tablet Place 0.4 mg under the tongue every 5 (five) minutes x 3 doses as needed. For chest pain    . pyridOXINE (VITAMIN B-6) 100 MG tablet Take 100 mg by mouth daily.     . Tamsulosin HCl (FLOMAX) 0.4 MG CAPS Take 0.4 mg by mouth daily.     . valsartan (DIOVAN) 80 MG tablet Take 1 tablet (80 mg total) by mouth daily. 30 tablet 6   No  current facility-administered medications for this visit.     Allergies:   Propylene glycol   Social History:  The patient  reports that he has never smoked. He has never used smokeless tobacco. He reports that he does not drink alcohol or use drugs.   Family History:  The patient's family history includes Heart attack in his mother; Heart attack (age of onset: 60) in his son; Heart disease in his father and mother; Hyperlipidemia in his father; Hypertension in his father.    ROS:  Please see the history of present illness.  Otherwise, review of systems is positive for rash, easy bruising.  All other systems are reviewed and negative.    PHYSICAL EXAM: VS:  BP 118/68   Pulse 74   Ht 5' 7.5" (1.715 m)   Wt 81 kg (178 lb 9.6 oz)   BMI 27.56 kg/m  , BMI Body mass index is 27.56 kg/m. GEN: Well nourished, well developed, in no acute distress  HEENT: normal  Neck: no JVD, no masses. Bilateral carotid bruits Cardiac: Irregular, grade 3/6 late peaking systolic crescendo decrescendo murmur best heard at the left lower sternal border, A2 is diminished at the right upper sternal border              Respiratory:  Coarse breath sounds bilaterally, normal work of breathing GI: soft, nontender, nondistended, + BS MS: no deformity or atrophy  Ext: no pretibial edema, pedal pulses 2+= bilaterally Skin: warm and dry, no rash Neuro:  Strength and sensation are intact Psych: euthymic mood, full affect  EKG:  EKG is not ordered today.   Recent Labs: 04/14/2016: BUN 19; Creat 0.95; Hemoglobin 11.7; Platelets 182; Potassium 4.2; Sodium 138   Lipid Panel     Component Value Date/Time   CHOL 143 08/06/2006 0814   TRIG 141 08/06/2006 0814   HDL 29.7 (L) 08/06/2006 0814   CHOLHDL 4.8 CALC 08/06/2006 0814   VLDL 28 08/06/2006 0814   LDLCALC 85 08/06/2006 0814      Wt Readings from Last 3 Encounters:  04/14/16 81 kg (178 lb 9.6 oz)  04/05/16 81.6 kg (179 lb 12.8 oz)  03/07/16 81.6 kg (180  lb)     Cardiac Studies Reviewed: Exercise tolerance test: Stress Findings   Stress Findings The patient exercised following a modified Bruce protocol.  The patient reported no symptoms during the stress test. The patient experienced no angina during the stress test.   The patient requested the test to be stopped. The test was stopped because the patient complained of fatigue.   Blood pressure and heart rate demonstrated a normal response to exercise. Blood pressure demonstrated a normal response to exercise. Overall, the patient's exercise capacity was normal.   85% of maximum heart rate was achieved after 2.1 minutes. Recovery time: 7 minutes. The patient's response to exercise was adequate for diagnosis.    Stress Measurements   Baseline Vitals  Rest HR 96 bpm    Rest BP 154/69 mmHg    Exercise Time  Exercise duration (min) 7 min    Exercise duration (sec) 48 sec    Peak Stress Vitals  Peak HR 121 bpm    Peak BP 157/61 mmHg    Exercise Data  MPHR 137 bpm    Percent HR 88 %    RPE 15     Estimated workload 4.4 METS           Echo 03-29-2016: Study Conclusions  - Left ventricle: The cavity size was normal. Wall thickness was   increased in a pattern of mild LVH. Systolic function was low   normal. The estimated ejection fraction was 50%. Features are   consistent with a pseudonormal left ventricular filling pattern,   with concomitant abnormal relaxation and increased filling   pressure (grade 2 diastolic dysfunction). Doppler parameters are   consistent with high ventricular filling pressure. - Regional wall motion abnormality: Hypokinesis of the mid inferior   and mid inferolateral myocardium. - Aortic valve: Moderately thickened, moderately calcified  leaflets. Cusp separation was severely reduced. There was   moderate stenosis. There was mild regurgitation. Peak velocity   (S): 302 cm/s. Mean gradient (S): 21 mm Hg. Valve area (VTI):   1.47 cm^2.  Valve area (Vmax): 1.23 cm^2. Valve area (Vmean): 1.22   cm^2. - Mitral valve: Calcified annulus. There was moderate   regurgitation. - Left atrium: The atrium was moderately dilated. - Right ventricle: The cavity size was mildly dilated. Systolic   function was mildly reduced. - Tricuspid valve: Mildly thickened leaflets. There was mild   regurgitation.  Cardiac catheterization 11/17/2015: Conclusion    Ost LM lesion, 100% stenosed.  Ost LAD to Prox LAD lesion, 100% stenosed.  Ost Cx to Mid Cx lesion, 100% stenosed.  Ost RCA to Dist RCA lesion, 100% stenosed.  LIMA was injected is normal in caliber, and is anatomically normal.  SVG was injected is normal in caliber.  Dist Graft lesion, 40% stenosed.  SVG was injected is normal in caliber, and is anatomically normal.  There is moderate left ventricular systolic dysfunction.  Origin lesion, 90% stenosed. Post intervention, there is a 0% residual stenosis.   1. Left main and RCA occlusive disease 2. Patent LIMA to the LAD 3. Patent SVG to the OM 4. Patent SVG to the PDA with severe stenosis in the proximal SVG 5. Moderate LV dysfunction with EF 35% 6. Moderate pulmonary HTN with elevated LV filling pressures. 7. Mild to moderate aortic stenosis. Mean gradient 18 mm Hg. AVA 1.8 cm squared. 8. Successful stenting of the SVG to the RCA with DES.  Plan: DAPT for one year. Will start lasix 40 mg po daily for CHF. Optimize CHF therapy. Anticipate DC in am.    Carotid duplex scan 06/30/2015: Patent left carotid endarterectomy site and less than 40% stenosis of the right ICA  ASSESSMENT AND PLAN: Exertional dyspnea and syncope in a patient with at least moderate aortic stenosis (NYHA III sx's). I have personally reviewed the patient's cardiac catheterization and echo studies. His symptoms are highly suggestive of severe symptomatic aortic stenosis in that he develops exertional dyspnea and syncope with physical activity. His  examination is also consistent of severe aortic stenosis. His echo images are reviewed and transvalvular gradients are less than expected, but his aortic valve is severely calcified and it barely opens. He also has impaired left ventricular function and I think there is a high likelihood that he has severe low gradient  aortic stenosis. We discussed potential diagnostic and treatment options at length. I have reviewed the natural history of aortic stenosis with the patient today. We have discussed the limitations of medical therapy and the poor prognosis associated with symptomatic aortic stenosis. We discussed treatment options in the context of this patient's specific comorbid medical conditions. Since he underwent vein graft PCI about 6 months ago, I think it is important that we document the stent patency prior to consideration of a provocative test like a dobutamine study. Will plan on performing a right and left heart catheterization to evaluate hemodynamics and bypass graft patency. At the time of his catheterization, will obtain an additional arterial access site so that simultaneous gradients can be measured and if necessary will give IV dobutamine for an invasive hemodynamic assessment for low gradient aortic stenosis. The patient understands the rationale for all this. If he is shown to have severe low gradient aortic stenosis, will refer him to cardiac surgery for a multidisciplinary approach to his treatment options. I have reviewed the risks,  indications, and alternatives to cardiac catheterization, possible angioplasty, and stenting with the patient. Risks include but are not limited to bleeding, infection, vascular injury, stroke, myocardial infection, arrhythmia, kidney injury, radiation-related injury in the case of prolonged fluoroscopy use, emergency cardiac surgery, and death. The patient understands the risks of serious complication is 1-2 in [id number] with diagnostic cardiac cath and 1-2% or less  with angioplasty/stenting.   The patient's case is complex and he does have conduction disease. He has been seen by Dr. [name]. He has a loop recorder but has not had bradycardic events at the time of his syncopal episodes. His exercise tolerance test was reviewed and on the modified Bruce protocol at low level he did not have hypotension, bradycardia arrhythmia, or symptoms.  Current medicines are reviewed with the patient today.  The patient does not have concerns regarding medicines.  Labs/ tests ordered today include:   Orders Placed This Encounter  Procedures  . Basic metabolic panel  . CBC  . INR/PT   Disposition:   FU after cardiac catheterization  Signed, [name], MD  04/14/2016 10:47 PM    [address], [place], [place]  [address] Phone: [phone]; Fax: [phone]

## 2016-04-14 NOTE — Patient Instructions (Signed)
Medication Instructions:  Your physician recommends that you continue on your current medications as directed. Please refer to the Current Medication list given to you today.  Labwork: Your physician recommends that you have lab work today: BMP, CBC and PT/INR  Testing/Procedures: Your physician has requested that you have a cardiac catheterization. Cardiac catheterization is used to diagnose and/or treat various heart conditions. Doctors may recommend this procedure for a number of different reasons. The most common reason is to evaluate chest pain. Chest pain can be a symptom of coronary artery disease (CAD), and cardiac catheterization can show whether plaque is narrowing or blocking your heart's arteries. This procedure is also used to evaluate the valves, as well as measure the blood flow and oxygen levels in different parts of your heart. For further information please visit [email or web address]. Please follow instruction sheet, as given.  Follow-Up: We will arrange further evaluation after cardiac catheterization.   Any Other Special Instructions Will Be Listed Below (If Applicable).     If you need a refill on your cardiac medications before your next appointment, please call your pharmacy.

## 2016-04-16 LAB — CUP PACEART REMOTE DEVICE CHECK: MDC IDC SESS DTM: 20170730223801

## 2016-04-18 ENCOUNTER — Ambulatory Visit (INDEPENDENT_AMBULATORY_CARE_PROVIDER_SITE_OTHER): Payer: Medicare HMO | Admitting: *Deleted

## 2016-04-18 DIAGNOSIS — R55 Syncope and collapse: Secondary | ICD-10-CM

## 2016-04-19 ENCOUNTER — Encounter (HOSPITAL_COMMUNITY): Payer: Self-pay | Admitting: *Deleted

## 2016-04-19 ENCOUNTER — Encounter (HOSPITAL_COMMUNITY)
Admission: RE | Disposition: A | Discharge status: Home / Self Care | Payer: Self-pay | Source: Ambulatory Visit | Attending: Cardiovascular Disease

## 2016-04-19 ENCOUNTER — Ambulatory Visit (HOSPITAL_COMMUNITY)
Admission: RE | Admit: 2016-04-19 | Discharge: 2016-04-19 | Disposition: A | Discharge status: Home / Self Care | Payer: Medicare HMO | Source: Ambulatory Visit | Attending: Cardiovascular Disease | Admitting: Cardiovascular Disease

## 2016-04-19 DIAGNOSIS — E785 Hyperlipidemia, unspecified: Secondary | ICD-10-CM | POA: Diagnosis not present

## 2016-04-19 DIAGNOSIS — L409 Psoriasis, unspecified: Secondary | ICD-10-CM | POA: Diagnosis not present

## 2016-04-19 DIAGNOSIS — I2510 Atherosclerotic heart disease of native coronary artery without angina pectoris: Secondary | ICD-10-CM | POA: Diagnosis not present

## 2016-04-19 DIAGNOSIS — Z7982 Long term (current) use of aspirin: Secondary | ICD-10-CM | POA: Insufficient documentation

## 2016-04-19 DIAGNOSIS — Z8249 Family history of ischemic heart disease and other diseases of the circulatory system: Secondary | ICD-10-CM | POA: Diagnosis not present

## 2016-04-19 DIAGNOSIS — Z7902 Long term (current) use of antithrombotics/antiplatelets: Secondary | ICD-10-CM | POA: Insufficient documentation

## 2016-04-19 DIAGNOSIS — K219 Gastro-esophageal reflux disease without esophagitis: Secondary | ICD-10-CM | POA: Diagnosis not present

## 2016-04-19 DIAGNOSIS — I2584 Coronary atherosclerosis due to calcified coronary lesion: Secondary | ICD-10-CM | POA: Diagnosis not present

## 2016-04-19 DIAGNOSIS — I350 Nonrheumatic aortic (valve) stenosis: Secondary | ICD-10-CM | POA: Diagnosis not present

## 2016-04-19 DIAGNOSIS — Z951 Presence of aortocoronary bypass graft: Secondary | ICD-10-CM | POA: Diagnosis not present

## 2016-04-19 DIAGNOSIS — N400 Enlarged prostate without lower urinary tract symptoms: Secondary | ICD-10-CM | POA: Insufficient documentation

## 2016-04-19 DIAGNOSIS — Z87442 Personal history of urinary calculi: Secondary | ICD-10-CM | POA: Insufficient documentation

## 2016-04-19 DIAGNOSIS — T82855A Stenosis of coronary artery stent, initial encounter: Secondary | ICD-10-CM | POA: Diagnosis not present

## 2016-04-19 DIAGNOSIS — E039 Hypothyroidism, unspecified: Secondary | ICD-10-CM | POA: Insufficient documentation

## 2016-04-19 DIAGNOSIS — I10 Essential (primary) hypertension: Secondary | ICD-10-CM | POA: Diagnosis not present

## 2016-04-19 DIAGNOSIS — I2582 Chronic total occlusion of coronary artery: Secondary | ICD-10-CM | POA: Diagnosis not present

## 2016-04-19 DIAGNOSIS — I493 Ventricular premature depolarization: Secondary | ICD-10-CM | POA: Insufficient documentation

## 2016-04-19 DIAGNOSIS — Z96652 Presence of left artificial knee joint: Secondary | ICD-10-CM | POA: Diagnosis not present

## 2016-04-19 DIAGNOSIS — M1990 Unspecified osteoarthritis, unspecified site: Secondary | ICD-10-CM | POA: Diagnosis not present

## 2016-04-19 DIAGNOSIS — Y712 Prosthetic and other implants, materials and accessory cardiovascular devices associated with adverse incidents: Secondary | ICD-10-CM | POA: Diagnosis not present

## 2016-04-19 HISTORY — PX: CARDIAC CATHETERIZATION: SHX172

## 2016-04-19 LAB — POCT I-STAT 3, ART BLOOD GAS (G3+)
ACID-BASE EXCESS: 2 mmol/L (ref 0.0–2.0)
Acid-Base Excess: 4 mmol/L — ABNORMAL HIGH (ref 0.0–2.0)
BICARBONATE: 25.4 mmol/L (ref 20.0–28.0)
Bicarbonate: 29.5 mmol/L — ABNORMAL HIGH (ref 20.0–28.0)
O2 Saturation: 99 %
O2 Saturation: 99 %
PCO2 ART: 45.2 mmHg (ref 32.0–48.0)
PH ART: 7.423 (ref 7.350–7.450)
PH ART: 7.482 — AB (ref 7.350–7.450)
PO2 ART: 151 mmHg — AB (ref 83.0–108.0)
PO2 ART: 140 mmHg — AB (ref 83.0–108.0)
TCO2: 31 mmol/L (ref 0–100)
TCO2: 26 mmol/L (ref 0–100)
pCO2 arterial: 34 mmHg (ref 32.0–48.0)

## 2016-04-19 LAB — EXERCISE TOLERANCE TEST
CHL RATE OF PERCEIVED EXERTION: 15
CSEPED: 7 min
CSEPEDS: 48 s
Estimated workload: 4.4 METS
MPHR: 137 {beats}/min
Peak HR: 121 {beats}/min
Percent HR: 88 %
Rest HR: 96 {beats}/min

## 2016-04-19 LAB — POCT ACTIVATED CLOTTING TIME
Activated Clotting Time: 180 seconds
Activated Clotting Time: 186 seconds

## 2016-04-19 LAB — POCT I-STAT 3, VENOUS BLOOD GAS (G3P V)
ACID-BASE EXCESS: 2 mmol/L (ref 0.0–2.0)
Acid-Base Excess: 2 mmol/L (ref 0.0–2.0)
BICARBONATE: 27.1 mmol/L (ref 20.0–28.0)
Bicarbonate: 27 mmol/L (ref 20.0–28.0)
O2 SAT: 75 %
O2 Saturation: 58 %
PCO2 VEN: 43 mmHg — AB (ref 44.0–60.0)
PCO2 VEN: 42.6 mmHg — AB (ref 44.0–60.0)
PH VEN: 7.412 (ref 7.250–7.430)
PO2 VEN: 40 mmHg (ref 32.0–45.0)
TCO2: 28 mmol/L (ref 0–100)
TCO2: 28 mmol/L (ref 0–100)
pH, Ven: 7.406 (ref 7.250–7.430)
pO2, Ven: 30 mmHg — CL (ref 32.0–45.0)

## 2016-04-19 SURGERY — RIGHT/LEFT HEART CATH AND CORONARY/GRAFT ANGIOGRAPHY
Anesthesia: LOCAL

## 2016-04-19 MED ORDER — LIDOCAINE HCL (PF) 1 % IJ SOLN
INTRAMUSCULAR | Status: AC
Start: 2016-04-19 — End: 2016-04-19
  Filled 2016-04-19: qty 30

## 2016-04-19 MED ORDER — ONDANSETRON HCL 4 MG/2ML IJ SOLN: 4.0000 mg | Freq: Four times a day (QID) | INTRAMUSCULAR | Status: DC | PRN

## 2016-04-19 MED ORDER — VERAPAMIL HCL 2.5 MG/ML IV SOLN
INTRAVENOUS | Status: AC
  Filled 2016-04-19: qty 2

## 2016-04-19 MED ORDER — SODIUM CHLORIDE 0.9 % WEIGHT BASED INFUSION: 1.0000 mL/kg/h | INTRAVENOUS | Status: DC

## 2016-04-19 MED ORDER — SODIUM CHLORIDE 0.9 % IV SOLN: 250.0000 mL | INTRAVENOUS | Status: DC | PRN

## 2016-04-19 MED ORDER — HEPARIN SODIUM (PORCINE) 1000 UNIT/ML IJ SOLN
INTRAMUSCULAR | Status: AC
Start: 2016-04-19 — End: 2016-04-19
  Filled 2016-04-19: qty 1

## 2016-04-19 MED ORDER — FENTANYL CITRATE (PF) 100 MCG/2ML IJ SOLN
INTRAMUSCULAR | Status: AC
  Filled 2016-04-19: qty 2

## 2016-04-19 MED ORDER — SODIUM CHLORIDE 0.9% FLUSH: 3.0000 mL | INTRAVENOUS | Status: DC | PRN

## 2016-04-19 MED ORDER — MIDAZOLAM HCL 2 MG/2ML IJ SOLN
INTRAMUSCULAR | Status: DC | PRN
  Administered 2016-04-19: 1 mg via INTRAVENOUS

## 2016-04-19 MED ORDER — LIDOCAINE HCL (PF) 1 % IJ SOLN
INTRAMUSCULAR | Status: DC | PRN
  Administered 2016-04-19: 2 mL
  Administered 2016-04-19: 15 mL
  Administered 2016-04-19: 2 mL

## 2016-04-19 MED ORDER — VERAPAMIL HCL 2.5 MG/ML IV SOLN
INTRAVENOUS | Status: DC | PRN
  Administered 2016-04-19: 10 mL via INTRA_ARTERIAL

## 2016-04-19 MED ORDER — SODIUM CHLORIDE 0.9 % IV SOLN: INTRAVENOUS | Status: DC

## 2016-04-19 MED ORDER — SODIUM CHLORIDE 0.9 % WEIGHT BASED INFUSION
3.0000 mL/kg/h | INTRAVENOUS | Status: AC
  Administered 2016-04-19: 3 mL/kg/h via INTRAVENOUS

## 2016-04-19 MED ORDER — SODIUM CHLORIDE 0.9 % IV SOLN
250.0000 mL | INTRAVENOUS | Status: DC | PRN
Start: 2016-04-19 — End: 2016-04-19

## 2016-04-19 MED ORDER — HEPARIN SODIUM (PORCINE) 1000 UNIT/ML IJ SOLN
INTRAMUSCULAR | Status: DC | PRN
  Administered 2016-04-19: 4000 [IU] via INTRAVENOUS

## 2016-04-19 MED ORDER — SODIUM CHLORIDE 0.9% FLUSH: 3.0000 mL | Freq: Two times a day (BID) | INTRAVENOUS | Status: DC

## 2016-04-19 MED ORDER — IOPAMIDOL (ISOVUE-370) INJECTION 76%
INTRAVENOUS | Status: DC | PRN
  Administered 2016-04-19: 75 mL

## 2016-04-19 MED ORDER — ACETAMINOPHEN 325 MG PO TABS: 650.0000 mg | ORAL_TABLET | ORAL | Status: DC | PRN

## 2016-04-19 MED ORDER — HEPARIN (PORCINE) IN NACL 2-0.9 UNIT/ML-% IJ SOLN
INTRAMUSCULAR | Status: AC
Start: 2016-04-19 — End: 2016-04-19
  Filled 2016-04-19: qty 1500

## 2016-04-19 MED ORDER — MIDAZOLAM HCL 2 MG/2ML IJ SOLN
INTRAMUSCULAR | Status: AC
  Filled 2016-04-19: qty 2

## 2016-04-19 MED ORDER — DOBUTAMINE INFUSION FOR EP/ECHO/NUC (1000 MCG/ML)
0.0000 ug/kg/min | INTRAVENOUS | Status: AC
  Administered 2016-04-19: 5 ug/kg/min via INTRAVENOUS
  Filled 2016-04-19 (×2): qty 250

## 2016-04-19 MED ORDER — SODIUM CHLORIDE 0.9% FLUSH
3.0000 mL | Freq: Two times a day (BID) | INTRAVENOUS | Status: DC
Start: 2016-04-19 — End: 2016-04-19

## 2016-04-19 MED ORDER — ASPIRIN 81 MG PO CHEW: 81.0000 mg | CHEWABLE_TABLET | ORAL | Status: DC

## 2016-04-19 MED ORDER — FENTANYL CITRATE (PF) 100 MCG/2ML IJ SOLN
INTRAMUSCULAR | Status: DC | PRN
  Administered 2016-04-19: 25 ug via INTRAVENOUS

## 2016-04-19 MED ORDER — HEPARIN (PORCINE) IN NACL 2-0.9 UNIT/ML-% IJ SOLN
INTRAMUSCULAR | Status: DC | PRN
  Administered 2016-04-19: 1500 mL via INTRA_ARTERIAL

## 2016-04-19 SURGICAL SUPPLY — 18 items
CATH BALLN WEDGE 5F 110CM (CATHETERS) ×2 IMPLANT
CATH INFINITI 4FR 145 PIGTAIL (CATHETERS) ×2 IMPLANT
CATH INFINITI 5 FR MPA2 (CATHETERS) ×2 IMPLANT
CATH INFINITI 5FR AL1 (CATHETERS) ×2 IMPLANT
CATH INFINITI 5FR MULTPACK ANG (CATHETERS) ×2 IMPLANT
DEVICE RAD COMP TR BAND LRG (VASCULAR PRODUCTS) ×2 IMPLANT
GLIDESHEATH SLEND SS 6F .021 (SHEATH) ×2 IMPLANT
KIT HEART LEFT (KITS) ×2 IMPLANT
KIT HEART RIGHT NAMIC (KITS) ×2 IMPLANT
PACK CARDIAC CATHETERIZATION (CUSTOM PROCEDURE TRAY) ×2 IMPLANT
SHEATH FAST CATH BRACH 5F 5CM (SHEATH) ×2 IMPLANT
SHEATH PINNACLE 4F 10CM (SHEATH) ×2 IMPLANT
SHEATH PINNACLE 5F 10CM (SHEATH) ×2 IMPLANT
TRANSDUCER W/STOPCOCK (MISCELLANEOUS) ×4 IMPLANT
TUBING CIL FLEX 10 FLL-RA (TUBING) ×2 IMPLANT
WIRE EMERALD 3MM-J .035X150CM (WIRE) ×2 IMPLANT
WIRE EMERALD ST .035X260CM (WIRE) ×2 IMPLANT
WIRE SAFE-T 1.5MM-J .035X260CM (WIRE) ×2 IMPLANT

## 2016-04-19 NOTE — H&P (View-Only) (Signed)
Cardiology Office Note Date:  04/14/2016   ID:  [name], DOB 1931/08/11, MRN [id number]  PCP:  [name], MD  Cardiologist:  [name], MD    Chief Complaint  Patient presents with  . Advice Only    eval for TAVR   History of Present Illness: [name] is a 82 y.o. male who presents for evaluation of aortic stenosis. The patient has coronary artery disease and underwent CABG in 1995. He has undergone saphenous vein graft PCI on 2 occasions, most recently in March 2017. He has been noted to have moderate aortic stenosis.  The patient is referred for evaluation of aortic stenosis. He's been followed for moderate aortic stenosis, but has had episodes of exertional syncope. He has undergone fairly recent evaluation with echocardiography, cardiac catheterization, and exercise stress testing.  The patient states "if I get in a hurry and walk out of my house fast, I get short of breath and have passed out several times." States symptoms are worse if he has recently eaten. Some days he can do the same amount of activity and has not symptoms, but other days he is short of breath with activity. He has been extremely active over the years and normally would be able to walk an unlimited amount, but now states that 'I just know something is wrong.' Now with walking 1/8 mile he has to stop and rest a few times because of exertional dyspnea.   After his most recent PCI in March 2017, he did not appreciate any improvement in symptoms. Thinks he has had approximately 5 syncopal episodes over the past 1-2 years. No positional symptoms. No chest pain, edema, orthopnea, or PND.   Past Medical History:  Diagnosis Date  . Aortic stenosis    Mild, echo, August, 2012  . Arthritis   . Benign prostatic hypertrophy   . CAD (coronary artery disease)    DES, SVG to circumflex marginal, October, 2010 ( SVG to PDA and PLA patent , LIMA to LAD patent, EF 60%, mild inferior hypokinesis  . Carotid  artery disease ([place])    DES, SVG to circumflex marginal, October, 2010 ( SVG to PDA and PLA patent , LIMA to LAD patent, EF 60%, mild inferior hypokinesis; H/o mild CHF, CABG  . GERD (gastroesophageal reflux disease)    takes daily  . HTN (hypertension)    takes meds daily  . Hx of decompressive lumbar laminectomy    Dr.Botero December, 2011  . Hyperlipidemia   . Hypothyroidism   . Nephrolithiasis   . Neuromuscular disorder ([place])   . Psoriasis    severe; with total skin exfoliation in the past resolved  . PVC's (premature ventricular contractions)    May, 2014  . Syncope    Remote, etiology unknown  . Ventricular tachycardia, non-sustained [facility])     Past Surgical History:  Procedure Laterality Date  . APPENDECTOMY    . BACK SURGERY     x3  . bilateral L3-4 laminectomies    . CARDIAC CATHETERIZATION N/A 11/17/2015   Procedure: Right/Left Heart Cath and Coronary Angiography;  Surgeon: Peter M Martinique, MD;  Location: [facility];  Service: Cardiovascular;  Laterality: N/A;  . CARDIAC CATHETERIZATION N/A 11/17/2015   Procedure: Coronary Stent Intervention;  Surgeon: Peter M Martinique, MD;  Location: [facility];  Service: Cardiovascular;  Laterality: N/A;  . [facility] Remote Monitoring  Oct. 29, 2015   by Dr. [name]  . CAROTID ENDARTERECTOMY Left 02-24-08  cea  Dr. Amedeo Plenty  . CARPAL TUNNEL RELEASE Bilateral 2010  . CATARACT EXTRACTION, BILATERAL    . COLONOSCOPY  11/23/2011   Procedure: COLONOSCOPY;  Surgeon: Rogene Houston, MD;  Location: [facility];  Service: Endoscopy;  Laterality: N/A;  730  . CORONARY ARTERY BYPASS GRAFT  1995  . CORONARY STENT PLACEMENT  11/17/2015   SVG   DES to RCA  . decompression of left median nerve    . JOINT REPLACEMENT Left Sept. 27, 2013   Knee  . left carotid endarectomy and Dacron patch angioplasty]    . LEFT HEART CATHETERIZATION WITH CORONARY ANGIOGRAM N/A 12/27/2012   Procedure: LEFT HEART CATHETERIZATION WITH CORONARY  ANGIOGRAM;  Surgeon: Burnell Blanks, MD;  Location: [facility] CATH LAB;  Service: Cardiovascular;  Laterality: N/A;  . posterior arthrodesis with autograft and allograft    . removal of synovial cyst    . SPINE SURGERY     X's 3  . TONSILLECTOMY    . TOTAL KNEE ARTHROPLASTY  04/16/2012   Procedure: TOTAL KNEE ARTHROPLASTY;  Surgeon: Garald Balding, MD;  Location: [facility];  Service: Orthopedics;  Laterality: Left;    Current Outpatient Prescriptions  Medication Sig Dispense Refill  . allopurinol (ZYLOPRIM) 300 MG tablet Take 600 mg by mouth daily.     Marland Kitchen aspirin EC 81 MG tablet Take 81 mg by mouth daily.    Marland Kitchen atorvastatin (LIPITOR) 20 MG tablet Take 20 mg by mouth every evening.     . clobetasol cream (TEMOVATE) [id number] % Apply 1 application topically 2 (two) times daily.    . clopidogrel (PLAVIX) 75 MG tablet Take 1 tablet (75 mg total) by mouth daily with breakfast. 90 tablet 3  . diclofenac sodium (VOLTAREN) 1 % GEL Apply 4 g topically 4 (four) times daily. (Patient not taking: Reported on 04/14/2016) 100 g 11  . Ferrous Sulfate (IRON) 325 (65 Fe) MG TABS Take 1 tablet by mouth daily.    . fluticasone (FLONASE) 50 MCG/ACT nasal spray Place 1 spray into both nostrils 2 (two) times daily.    . furosemide (LASIX) 20 MG tablet Take 20 mg by mouth daily.    Marland Kitchen latanoprost (XALATAN) 0.005 % ophthalmic solution Place 1 drop into both eyes at bedtime.    Marland Kitchen levothyroxine (SYNTHROID, LEVOTHROID) 112 MCG tablet Take 112 mcg by mouth daily before breakfast.    . multivitamin (THERAGRAN) per tablet Take 1 tablet by mouth daily.     . nitroGLYCERIN (NITROSTAT) 0.4 MG SL tablet Place 0.4 mg under the tongue every 5 (five) minutes x 3 doses as needed. For chest pain    . pyridOXINE (VITAMIN B-6) 100 MG tablet Take 100 mg by mouth daily.     . Tamsulosin HCl (FLOMAX) 0.4 MG CAPS Take 0.4 mg by mouth daily.     . valsartan (DIOVAN) 80 MG tablet Take 1 tablet (80 mg total) by mouth daily. 30 tablet 6   No  current facility-administered medications for this visit.     Allergies:   Propylene glycol   Social History:  The patient  reports that he has never smoked. He has never used smokeless tobacco. He reports that he does not drink alcohol or use drugs.   Family History:  The patient's family history includes Heart attack in his mother; Heart attack (age of onset: 33) in his son; Heart disease in his father and mother; Hyperlipidemia in his father; Hypertension in his father.    ROS:  Please see the history of present illness.  Otherwise, review of systems is positive for rash, easy bruising.  All other systems are reviewed and negative.    PHYSICAL EXAM: VS:  BP 118/68   Pulse 74   Ht 5' 7.5" (1.715 m)   Wt 81 kg (178 lb 9.6 oz)   BMI 27.56 kg/m  , BMI Body mass index is 27.56 kg/m. GEN: Well nourished, well developed, in no acute distress  HEENT: normal  Neck: no JVD, no masses. Bilateral carotid bruits Cardiac: Irregular, grade 3/6 late peaking systolic crescendo decrescendo murmur best heard at the left lower sternal border, A2 is diminished at the right upper sternal border              Respiratory:  Coarse breath sounds bilaterally, normal work of breathing GI: soft, nontender, nondistended, + BS MS: no deformity or atrophy  Ext: no pretibial edema, pedal pulses 2+= bilaterally Skin: warm and dry, no rash Neuro:  Strength and sensation are intact Psych: euthymic mood, full affect  EKG:  EKG is not ordered today.   Recent Labs: 04/14/2016: BUN 19; Creat 0.95; Hemoglobin 11.7; Platelets 182; Potassium 4.2; Sodium 138   Lipid Panel     Component Value Date/Time   CHOL 143 08/06/2006 0814   TRIG 141 08/06/2006 0814   HDL 29.7 (L) 08/06/2006 0814   CHOLHDL 4.8 CALC 08/06/2006 0814   VLDL 28 08/06/2006 0814   LDLCALC 85 08/06/2006 0814      Wt Readings from Last 3 Encounters:  04/14/16 81 kg (178 lb 9.6 oz)  04/05/16 81.6 kg (179 lb 12.8 oz)  03/07/16 81.6 kg (180  lb)     Cardiac Studies Reviewed: Exercise tolerance test: Stress Findings   Stress Findings The patient exercised following a modified Bruce protocol.  The patient reported no symptoms during the stress test. The patient experienced no angina during the stress test.   The patient requested the test to be stopped. The test was stopped because the patient complained of fatigue.   Blood pressure and heart rate demonstrated a normal response to exercise. Blood pressure demonstrated a normal response to exercise. Overall, the patient's exercise capacity was normal.   85% of maximum heart rate was achieved after 2.1 minutes. Recovery time: 7 minutes. The patient's response to exercise was adequate for diagnosis.    Stress Measurements   Baseline Vitals  Rest HR 96 bpm    Rest BP 154/69 mmHg    Exercise Time  Exercise duration (min) 7 min    Exercise duration (sec) 48 sec    Peak Stress Vitals  Peak HR 121 bpm    Peak BP 157/61 mmHg    Exercise Data  MPHR 137 bpm    Percent HR 88 %    RPE 15     Estimated workload 4.4 METS           Echo 03-29-2016: Study Conclusions  - Left ventricle: The cavity size was normal. Wall thickness was   increased in a pattern of mild LVH. Systolic function was low   normal. The estimated ejection fraction was 50%. Features are   consistent with a pseudonormal left ventricular filling pattern,   with concomitant abnormal relaxation and increased filling   pressure (grade 2 diastolic dysfunction). Doppler parameters are   consistent with high ventricular filling pressure. - Regional wall motion abnormality: Hypokinesis of the mid inferior   and mid inferolateral myocardium. - Aortic valve: Moderately thickened, moderately calcified  leaflets. Cusp separation was severely reduced. There was   moderate stenosis. There was mild regurgitation. Peak velocity   (S): 302 cm/s. Mean gradient (S): 21 mm Hg. Valve area (VTI):   1.47 cm^2.  Valve area (Vmax): 1.23 cm^2. Valve area (Vmean): 1.22   cm^2. - Mitral valve: Calcified annulus. There was moderate   regurgitation. - Left atrium: The atrium was moderately dilated. - Right ventricle: The cavity size was mildly dilated. Systolic   function was mildly reduced. - Tricuspid valve: Mildly thickened leaflets. There was mild   regurgitation.  Cardiac catheterization 11/17/2015: Conclusion    Ost LM lesion, 100% stenosed.  Ost LAD to Prox LAD lesion, 100% stenosed.  Ost Cx to Mid Cx lesion, 100% stenosed.  Ost RCA to Dist RCA lesion, 100% stenosed.  LIMA was injected is normal in caliber, and is anatomically normal.  SVG was injected is normal in caliber.  Dist Graft lesion, 40% stenosed.  SVG was injected is normal in caliber, and is anatomically normal.  There is moderate left ventricular systolic dysfunction.  Origin lesion, 90% stenosed. Post intervention, there is a 0% residual stenosis.   1. Left main and RCA occlusive disease 2. Patent LIMA to the LAD 3. Patent SVG to the OM 4. Patent SVG to the PDA with severe stenosis in the proximal SVG 5. Moderate LV dysfunction with EF 35% 6. Moderate pulmonary HTN with elevated LV filling pressures. 7. Mild to moderate aortic stenosis. Mean gradient 18 mm Hg. AVA 1.8 cm squared. 8. Successful stenting of the SVG to the RCA with DES.  Plan: DAPT for one year. Will start lasix 40 mg po daily for CHF. Optimize CHF therapy. Anticipate DC in am.    Carotid duplex scan 06/30/2015: Patent left carotid endarterectomy site and less than 40% stenosis of the right ICA  ASSESSMENT AND PLAN: Exertional dyspnea and syncope in a patient with at least moderate aortic stenosis (NYHA III sx's). I have personally reviewed the patient's cardiac catheterization and echo studies. His symptoms are highly suggestive of severe symptomatic aortic stenosis in that he develops exertional dyspnea and syncope with physical activity. His  examination is also consistent of severe aortic stenosis. His echo images are reviewed and transvalvular gradients are less than expected, but his aortic valve is severely calcified and it barely opens. He also has impaired left ventricular function and I think there is a high likelihood that he has severe low gradient  aortic stenosis. We discussed potential diagnostic and treatment options at length. I have reviewed the natural history of aortic stenosis with the patient today. We have discussed the limitations of medical therapy and the poor prognosis associated with symptomatic aortic stenosis. We discussed treatment options in the context of this patient's specific comorbid medical conditions. Since he underwent vein graft PCI about 6 months ago, I think it is important that we document the stent patency prior to consideration of a provocative test like a dobutamine study. Will plan on performing a right and left heart catheterization to evaluate hemodynamics and bypass graft patency. At the time of his catheterization, will obtain an additional arterial access site so that simultaneous gradients can be measured and if necessary will give IV dobutamine for an invasive hemodynamic assessment for low gradient aortic stenosis. The patient understands the rationale for all this. If he is shown to have severe low gradient aortic stenosis, will refer him to cardiac surgery for a multidisciplinary approach to his treatment options. I have reviewed the risks,  indications, and alternatives to cardiac catheterization, possible angioplasty, and stenting with the patient. Risks include but are not limited to bleeding, infection, vascular injury, stroke, myocardial infection, arrhythmia, kidney injury, radiation-related injury in the case of prolonged fluoroscopy use, emergency cardiac surgery, and death. The patient understands the risks of serious complication is 1-2 in [id number] with diagnostic cardiac cath and 1-2% or less  with angioplasty/stenting.   The patient's case is complex and he does have conduction disease. He has been seen by Dr. [name]. He has a loop recorder but has not had bradycardic events at the time of his syncopal episodes. His exercise tolerance test was reviewed and on the modified Bruce protocol at low level he did not have hypotension, bradycardia arrhythmia, or symptoms.  Current medicines are reviewed with the patient today.  The patient does not have concerns regarding medicines.  Labs/ tests ordered today include:   Orders Placed This Encounter  Procedures  . Basic metabolic panel  . CBC  . INR/PT   Disposition:   FU after cardiac catheterization  Signed, [name], MD  04/14/2016 10:47 PM    [address], [place], [place]  [address] Phone: [phone]; Fax: [phone]

## 2016-04-19 NOTE — Interval H&P Note (Signed)
History and Physical Interval Note:  04/19/2016 12:32 PM  [name]  has presented today for surgery, with the diagnosis of Aortic Stenosis  The various methods of treatment have been discussed with the patient and family. After consideration of risks, benefits and other options for treatment, the patient has consented to  Procedure(s): Right/Left Heart Cath and Coronary/Graft Angiography (N/A) as a surgical intervention .  The patient's history has been reviewed, patient examined, no change in status, stable for surgery.  I have reviewed the patient's chart and labs.  Questions were answered to the patient's satisfaction.     [name]

## 2016-04-19 NOTE — Progress Notes (Signed)
Carelink Summary Report / Loop Recorder 

## 2016-04-19 NOTE — Discharge Instructions (Signed)
Radial Site Care Refer to this sheet in the next few weeks. These instructions provide you with information about caring for yourself after your procedure. Your health care provider may also give you more specific instructions. Your treatment has been planned according to current medical practices, but problems sometimes occur. Call your health care provider if you have any problems or questions after your procedure. WHAT TO EXPECT AFTER THE PROCEDURE After your procedure, it is typical to have the following:  Bruising at the radial site that usually fades within 1-2 weeks.  Blood collecting in the tissue (hematoma) that may be painful to the touch. It should usually decrease in size and tenderness within 1-2 weeks. HOME CARE INSTRUCTIONS  Take medicines only as directed by your health care provider.  You may shower 24-48 hours after the procedure or as directed by your health care provider. Remove the bandage (dressing) and gently wash the site with plain soap and water. Pat the area dry with a clean towel. Do not rub the site, because this may cause bleeding.  Do not take baths, swim, or use a hot tub until your health care provider approves.  Check your insertion site every day for redness, swelling, or drainage.  Do not apply powder or lotion to the site.  Do not flex or bend the affected arm for 24 hours or as directed by your health care provider.  Do not push or pull heavy objects with the affected arm for 24 hours or as directed by your health care provider.  Do not lift over 10 lb (4.5 kg) for 5 days after your procedure or as directed by your health care provider.  Ask your health care provider when it is okay to:  Return to work or school.  Resume usual physical activities or sports.  Resume sexual activity.  Do not drive home if you are discharged the same day as the procedure. Have someone else drive you.  You may drive 24 hours after the procedure unless otherwise  instructed by your health care provider.  Do not operate machinery or power tools for 24 hours after the procedure.  If your procedure was done as an outpatient procedure, which means that you went home the same day as your procedure, a responsible adult should be with you for the first 24 hours after you arrive home.  Keep all follow-up visits as directed by your health care provider. This is important. SEEK MEDICAL CARE IF:  You have a fever.  You have chills.  You have increased bleeding from the radial site. Hold pressure on the site. Call [place] IF:  You have unusual pain at the radial site.  You have redness, warmth, or swelling at the radial site.  You have drainage (other than a small amount of blood on the dressing) from the radial site.  The radial site is bleeding, and the bleeding does not stop after 30 minutes of holding steady pressure on the site.  Your arm or hand becomes pale, cool, tingly, or numb.   This information is not intended to replace advice given to you by your health care provider. Make sure you discuss any questions you have with your health care provider.   Document Released: 09/09/2010 Document Revised: 08/28/2014 Document Reviewed: 02/23/2014 Elsevier Interactive Patient Education 2016 [place] After Refer to this sheet in the next few weeks. These instructions provide you with information about caring for yourself after your procedure. Your health  care provider may also give you more specific instructions. Your treatment has been planned according to current medical practices, but problems sometimes occur. Call your health care provider if you have any problems or questions after your procedure. WHAT TO EXPECT AFTER THE PROCEDURE After your procedure, it is typical to have the following:  Bruising at the catheter insertion site that usually fades within 1-2 weeks.  Blood collecting in the tissue  (hematoma) that may be painful to the touch. It should usually decrease in size and tenderness within 1-2 weeks. HOME CARE INSTRUCTIONS  Take medicines only as directed by your health care provider.  You may shower 24-48 hours after the procedure or as directed by your health care provider. Remove the bandage (dressing) and gently wash the site with plain soap and water. Pat the area dry with a clean towel. Do not rub the site, because this may cause bleeding.  Do not take baths, swim, or use a hot tub until your health care provider approves.  Check your insertion site every day for redness, swelling, or drainage.  Do not apply powder or lotion to the site.  Do not lift over 10 lb (4.5 kg) for 5 days after your procedure or as directed by your health care provider.  Ask your health care provider when it is okay to:  Return to work or school.  Resume usual physical activities or sports.  Resume sexual activity.  Do not drive home if you are discharged the same day as the procedure. Have someone else drive you.  You may drive 24 hours after the procedure unless otherwise instructed by your health care provider.  Do not operate machinery or power tools for 24 hours after the procedure or as directed by your health care provider.  If your procedure was done as an outpatient procedure, which means that you went home the same day as your procedure, a responsible adult should be with you for the first 24 hours after you arrive home.  Keep all follow-up visits as directed by your health care provider. This is important. SEEK MEDICAL CARE IF:  You have a fever.  You have chills.  You have increased bleeding from the catheter insertion site. Hold pressure on the site. CALL 911 SEEK IMMEDIATE MEDICAL CARE IF:  You have unusual pain at the catheter insertion site.  You have redness, warmth, or swelling at the catheter insertion site.  You have drainage (other than a small amount of  blood on the dressing) from the catheter insertion site.  The catheter insertion site is bleeding, and the bleeding does not stop after 30 minutes of holding steady pressure on the site.  The area near or just beyond the catheter insertion site becomes pale, cool, tingly, or numb.   This information is not intended to replace advice given to you by your health care provider. Make sure you discuss any questions you have with your health care provider.   Document Released: 02/23/2005 Document Revised: 08/28/2014 Document Reviewed: 01/08/2013 Elsevier Interactive Patient Education [employer].

## 2016-04-20 ENCOUNTER — Other Ambulatory Visit: Payer: Self-pay | Admitting: Cardiovascular Disease

## 2016-04-20 ENCOUNTER — Encounter (HOSPITAL_COMMUNITY): Payer: Self-pay | Admitting: Cardiovascular Disease

## 2016-04-21 ENCOUNTER — Ambulatory Visit (HOSPITAL_BASED_OUTPATIENT_CLINIC_OR_DEPARTMENT_OTHER): Payer: Medicare HMO

## 2016-04-21 ENCOUNTER — Ambulatory Visit (HOSPITAL_COMMUNITY)
Admission: RE | Admit: 2016-04-21 | Discharge: 2016-04-21 | Disposition: A | Discharge status: Home / Self Care | Payer: Medicare HMO | Source: Ambulatory Visit | Attending: Internal Medicine | Admitting: Internal Medicine

## 2016-04-21 ENCOUNTER — Ambulatory Visit (HOSPITAL_COMMUNITY): Payer: Medicare HMO | Admitting: Certified Registered Nurse Anesthetist

## 2016-04-21 ENCOUNTER — Encounter (HOSPITAL_COMMUNITY): Payer: Self-pay

## 2016-04-21 ENCOUNTER — Encounter (HOSPITAL_COMMUNITY)
Admission: RE | Disposition: A | Discharge status: Home / Self Care | Payer: Self-pay | Source: Ambulatory Visit | Attending: Internal Medicine

## 2016-04-21 ENCOUNTER — Encounter: Payer: Self-pay | Admitting: Internal Medicine

## 2016-04-21 DIAGNOSIS — E039 Hypothyroidism, unspecified: Secondary | ICD-10-CM | POA: Diagnosis not present

## 2016-04-21 DIAGNOSIS — Z951 Presence of aortocoronary bypass graft: Secondary | ICD-10-CM | POA: Diagnosis not present

## 2016-04-21 DIAGNOSIS — I2510 Atherosclerotic heart disease of native coronary artery without angina pectoris: Secondary | ICD-10-CM | POA: Diagnosis not present

## 2016-04-21 DIAGNOSIS — I10 Essential (primary) hypertension: Secondary | ICD-10-CM | POA: Insufficient documentation

## 2016-04-21 DIAGNOSIS — K219 Gastro-esophageal reflux disease without esophagitis: Secondary | ICD-10-CM | POA: Diagnosis not present

## 2016-04-21 DIAGNOSIS — I350 Nonrheumatic aortic (valve) stenosis: Secondary | ICD-10-CM

## 2016-04-21 DIAGNOSIS — I340 Nonrheumatic mitral (valve) insufficiency: Secondary | ICD-10-CM | POA: Diagnosis not present

## 2016-04-21 DIAGNOSIS — I080 Rheumatic disorders of both mitral and aortic valves: Secondary | ICD-10-CM | POA: Diagnosis not present

## 2016-04-21 DIAGNOSIS — Z79899 Other long term (current) drug therapy: Secondary | ICD-10-CM | POA: Diagnosis not present

## 2016-04-21 DIAGNOSIS — I5022 Chronic systolic (congestive) heart failure: Secondary | ICD-10-CM | POA: Diagnosis not present

## 2016-04-21 HISTORY — PX: TEE WITHOUT CARDIOVERSION: SHX5443

## 2016-04-21 LAB — ECHO TEE
AV pk vel: 343 cm/s
AVG: 29 mmHg
AVPG: 47 mmHg
DOP CAL AO MEAN VELOCITY: 258 cm/s
LDCA: 3.14 cm2
LVOTD: 20 mm
VTI: 83.7 cm

## 2016-04-21 SURGERY — ECHOCARDIOGRAM, TRANSESOPHAGEAL
Anesthesia: General

## 2016-04-21 MED ORDER — BUTAMBEN-TETRACAINE-BENZOCAINE 2-2-14 % EX AERO
INHALATION_SPRAY | CUTANEOUS | Status: DC | PRN
Start: 2016-04-21 — End: 2016-04-21
  Administered 2016-04-21: 2 via TOPICAL

## 2016-04-21 MED ORDER — LIDOCAINE HCL (CARDIAC) 20 MG/ML IV SOLN
INTRAVENOUS | Status: DC | PRN
  Administered 2016-04-21: 60 mg via INTRAVENOUS

## 2016-04-21 MED ORDER — PROPOFOL 10 MG/ML IV BOLUS
INTRAVENOUS | Status: DC | PRN
  Administered 2016-04-21 (×3): 20 mg via INTRAVENOUS

## 2016-04-21 MED ORDER — PHENYLEPHRINE HCL 10 MG/ML IJ SOLN
INTRAMUSCULAR | Status: DC | PRN
  Administered 2016-04-21: 80 ug via INTRAVENOUS
  Administered 2016-04-21: 160 ug via INTRAVENOUS
  Administered 2016-04-21: 200 ug via INTRAVENOUS
  Administered 2016-04-21 (×2): 120 ug via INTRAVENOUS

## 2016-04-21 MED ORDER — PROPOFOL 500 MG/50ML IV EMUL
INTRAVENOUS | Status: DC | PRN
  Administered 2016-04-21: 75 ug/kg/min via INTRAVENOUS

## 2016-04-21 MED ORDER — SODIUM CHLORIDE 0.9 % IV SOLN: INTRAVENOUS | Status: DC

## 2016-04-21 MED ORDER — LACTATED RINGERS IV SOLN
INTRAVENOUS | Status: DC | PRN
  Administered 2016-04-21: 12:00:00 via INTRAVENOUS

## 2016-04-21 NOTE — Progress Notes (Signed)
Echocardiogram Echocardiogram Transesophageal has been performed.  [name] 04/21/2016, 12:34 PM

## 2016-04-21 NOTE — H&P (Addendum)
     INTERVAL PROCEDURE H&P  History and Physical Interval Note:  04/21/2016 10:47 AM  [name] has presented today for their planned procedure. The various methods of treatment have been discussed with the patient and family. After consideration of risks, benefits and other options for treatment, the patient has consented to the procedure.  The patients' outpatient history has been reviewed, patient examined, and no change in status from most recent office note within the past 30 days. I have reviewed the patients' chart and labs and will proceed as planned. Questions were answered to the patient's satisfaction.   Given the patient's exam findings of severe aortic stenosis and known CAD s/p CABG as well as a history of narrow complex tachycardia recently under moderate sedation, I would prefer to use MAC/Anesthesia for sedation to lessen the chance of hemodynamic instability. I discussed the increased anesthesia risk with him and he understands it.  Pixie Casino, MD, [facility] Attending Cardiologist [place] C Hilty 04/21/2016, 10:47 AM

## 2016-04-21 NOTE — Transfer of Care (Signed)
Immediate Anesthesia Transfer of Care Note  Patient: [name]  Procedure(s) Performed: Procedure(s): TRANSESOPHAGEAL ECHOCARDIOGRAM (TEE) (N/A)  Patient Location: PACU and Endoscopy Unit  Anesthesia Type:MAC  Level of Consciousness: awake, alert , oriented and patient cooperative  Airway & Oxygen Therapy: Patient Spontanous Breathing and Patient connected to nasal cannula oxygen  Post-op Assessment: Report given to RN and Post -op Vital signs reviewed and stable  Post vital signs: Reviewed and stable  Last Vitals:  Vitals:   04/21/16 1118 04/21/16 1235  BP: (!) 142/64 (!) 107/59  Pulse: 84 78  Resp: 17 17    Last Pain:  Vitals:   04/21/16 1118  TempSrc: Oral         Complications: No apparent anesthesia complications

## 2016-04-21 NOTE — CV Procedure (Signed)
TRANSESOPHAGEAL ECHOCARDIOGRAM (TEE) NOTE  INDICATIONS: aortic stenosis, mitral regurgitation  PROCEDURE:   Informed consent was obtained prior to the procedure. The risks, benefits and alternatives for the procedure were discussed and the patient comprehended these risks.  Risks include, but are not limited to, cough, sore throat, vomiting, nausea, somnolence, esophageal and stomach trauma or perforation, bleeding, low blood pressure, aspiration, pneumonia, infection, trauma to the teeth and death.    After a procedural time-out, the patient was propofol per anesthesia for sedation.  The patient's heart rate, blood pressure, and oxygen saturation are monitored continuously during the procedure.The oropharynx was anesthetized with 2 cetacaine sprays.  The transesophageal probe was inserted in the esophagus and stomach without difficulty and multiple views were obtained.  The patient was kept under observation until the patient left the procedure room. The patient left the procedure room in stable condition.   Agitated microbubble saline contrast was administered.  COMPLICATIONS:    There were no immediate complications.  Findings:  1. LEFT VENTRICLE: The left ventricular wall thickness is mildly increased.  The left ventricular cavity is normal in size. Wall motion demonstrates inferior and inferolateral hypokinesis.  LVEF is 45-50%.  2. RIGHT VENTRICLE:  The right ventricle is normal in structure and function without any thrombus or masses.    3. LEFT ATRIUM:  The left atrium is mildly dilated in size without any thrombus or masses.  There is not spontaneous echo contrast ("smoke") in the left atrium consistent with a low flow state.  4. LEFT ATRIAL APPENDAGE:  The left atrial appendage is free of any thrombus or masses. The appendage has single lobes. Pulse doppler indicates moderate flow in the appendage.  5. ATRIAL SEPTUM:  The atrial septum appears intact and is free of thrombus  and/or masses.  There is no evidence for interatrial shunting by color doppler and saline microbubble.  6. RIGHT ATRIUM:  The right atrium is normal in size and function without any thrombus or masses.  7. MITRAL VALVE:  The mitral valve is mildly thickened with Mild regurgitation.  There were no vegetations or stenosis.  8. AORTIC VALVE:  The aortic valve is trileaflet but appears thickened and rheumatic with trivial regurgitation.  There is severe stenosis. The AVA is around 0.8-0.9 cm2 by planimetry. Peak and mean gradients of 29 mmHg and 47 mmHg, respectively.  9. TRICUSPID VALVE:  The tricuspid valve is normal in structure and function with Mild regurgitation.  There were no vegetations or stenosis  10.  PULMONIC VALVE:  The pulmonic valve is normal in structure and function with trivial regurgitation.  There were no vegetations or stenosis.   11. AORTIC ARCH, ASCENDING AND DESCENDING AORTA:  There was grade 2 Ron Parker et. Al, 1992) atherosclerosis of the aortic arch and proximal descending aorta.  12. PULMONARY VEINS: Anomalous pulmonary venous return was not noted.  13. PERICARDIUM: The pericardium appeared normal and non-thickened.  There is no pericardial effusion.  IMPRESSION:   1. Severe aortic stenosis - rheumatic appearing valve with thickened leaflets and calcified bases. The AVA is around 0.8-0.9 cm2 - mean gradient of 29 mmHg. 2. Mild MR 3. No LAA thrombus 4. Negative for PFO 5. LVEF 45-50%, inferior and inferolateral hypokinesis  RECOMMENDATIONS:    1.  Severely stenotic trileaflet aortic valve with minimal calcification, rheumatic appearing - would be a good candidate for TAVR.  Time Spent Directly with the Patient:  60 minutes   Pixie Casino, MD, [facility] Attending Cardiologist [facility]  04/21/2016, 12:53 PM

## 2016-04-21 NOTE — Anesthesia Procedure Notes (Signed)
Procedure Name: MAC Date/Time: 04/21/2016 12:05 PM Performed by: Oletta Lamas Pre-anesthesia Checklist: Patient identified, Emergency Drugs available, Suction available and Patient being monitored Patient Re-evaluated:Patient Re-evaluated prior to inductionOxygen Delivery Method: Circle system utilized and Nasal cannula Comments: Purple bite block with 02 used

## 2016-04-21 NOTE — Anesthesia Preprocedure Evaluation (Signed)
Anesthesia Evaluation  Patient identified by MRN, date of birth, ID band Patient awake    Reviewed: Allergy & Precautions, NPO status , Patient's Chart, lab work & pertinent test results  Airway Mallampati: I  TM Distance: >3 FB Neck ROM: Full    Dental  (+) Teeth Intact, Dental Advisory Given   Pulmonary    breath sounds clear to auscultation       Cardiovascular hypertension, Pt. on medications + CAD   Rhythm:Regular Rate:Normal     Neuro/Psych    GI/Hepatic GERD  Controlled and Medicated,  Endo/Other  Hypothyroidism   Renal/GU      Musculoskeletal   Abdominal   Peds  Hematology   Anesthesia Other Findings   Reproductive/Obstetrics                             Anesthesia Physical Anesthesia Plan  ASA: III  Anesthesia Plan: General   Post-op Pain Management:    Induction: Intravenous  Airway Management Planned: Simple Face Mask  Additional Equipment:   Intra-op Plan:   Post-operative Plan:   Informed Consent: I have reviewed the patients History and Physical, chart, labs and discussed the procedure including the risks, benefits and alternatives for the proposed anesthesia with the patient or authorized representative who has indicated his/her understanding and acceptance.   Dental advisory given  Plan Discussed with: CRNA, Anesthesiologist and Surgeon  Anesthesia Plan Comments:         Anesthesia Quick Evaluation

## 2016-04-21 NOTE — Anesthesia Postprocedure Evaluation (Signed)
Anesthesia Post Note  Patient: [name]  Procedure(s) Performed: Procedure(s) (LRB): TRANSESOPHAGEAL ECHOCARDIOGRAM (TEE) (N/A)  Patient location during evaluation: PACU Anesthesia Type: MAC Level of consciousness: awake and alert Pain management: pain level controlled Vital Signs Assessment: post-procedure vital signs reviewed and stable Respiratory status: spontaneous breathing, nonlabored ventilation and respiratory function stable Cardiovascular status: stable and blood pressure returned to baseline Anesthetic complications: no    Last Vitals:  Vitals:   04/21/16 1305 04/21/16 1315  BP: 134/65 (!) 124/94  Pulse: 79 83  Resp: 20 19    Last Pain:  Vitals:   04/21/16 1118  TempSrc: Oral                 [name] A

## 2016-04-21 NOTE — Discharge Instructions (Signed)

## 2016-04-22 NOTE — Progress Notes (Signed)
This was the afternoon of his cath and what I was seeing in the cath lab. thx

## 2016-04-23 ENCOUNTER — Encounter (HOSPITAL_COMMUNITY): Payer: Self-pay | Admitting: Internal Medicine

## 2016-04-25 ENCOUNTER — Other Ambulatory Visit: Payer: Self-pay

## 2016-04-25 ENCOUNTER — Telehealth: Payer: Self-pay | Admitting: Internal Medicine

## 2016-04-25 ENCOUNTER — Inpatient Hospital Stay (HOSPITAL_COMMUNITY)
Admission: EM | Admit: 2016-04-25 | Discharge: 2016-04-28 | DRG: 243 | Disposition: A | Discharge status: Home / Self Care | Payer: Medicare HMO | Attending: Cardiovascular Disease | Admitting: Cardiovascular Disease

## 2016-04-25 ENCOUNTER — Encounter: Payer: Medicare HMO | Admitting: Surgery

## 2016-04-25 ENCOUNTER — Encounter (HOSPITAL_COMMUNITY): Payer: Self-pay | Admitting: Emergency Medicine

## 2016-04-25 ENCOUNTER — Emergency Department (HOSPITAL_COMMUNITY): Payer: Medicare HMO

## 2016-04-25 DIAGNOSIS — E785 Hyperlipidemia, unspecified: Secondary | ICD-10-CM | POA: Diagnosis present

## 2016-04-25 DIAGNOSIS — S060X1A Concussion with loss of consciousness of 30 minutes or less, initial encounter: Secondary | ICD-10-CM

## 2016-04-25 DIAGNOSIS — Z7982 Long term (current) use of aspirin: Secondary | ICD-10-CM | POA: Diagnosis not present

## 2016-04-25 DIAGNOSIS — N400 Enlarged prostate without lower urinary tract symptoms: Secondary | ICD-10-CM | POA: Diagnosis present

## 2016-04-25 DIAGNOSIS — I441 Atrioventricular block, second degree: Secondary | ICD-10-CM | POA: Diagnosis not present

## 2016-04-25 DIAGNOSIS — K219 Gastro-esophageal reflux disease without esophagitis: Secondary | ICD-10-CM | POA: Diagnosis present

## 2016-04-25 DIAGNOSIS — W19XXXA Unspecified fall, initial encounter: Secondary | ICD-10-CM | POA: Diagnosis not present

## 2016-04-25 DIAGNOSIS — S0181XA Laceration without foreign body of other part of head, initial encounter: Secondary | ICD-10-CM | POA: Diagnosis present

## 2016-04-25 DIAGNOSIS — E039 Hypothyroidism, unspecified: Secondary | ICD-10-CM | POA: Diagnosis present

## 2016-04-25 DIAGNOSIS — D649 Anemia, unspecified: Secondary | ICD-10-CM | POA: Diagnosis present

## 2016-04-25 DIAGNOSIS — I5022 Chronic systolic (congestive) heart failure: Secondary | ICD-10-CM | POA: Diagnosis not present

## 2016-04-25 DIAGNOSIS — Z9841 Cataract extraction status, right eye: Secondary | ICD-10-CM | POA: Diagnosis not present

## 2016-04-25 DIAGNOSIS — S0990XA Unspecified injury of head, initial encounter: Secondary | ICD-10-CM | POA: Diagnosis present

## 2016-04-25 DIAGNOSIS — Z9842 Cataract extraction status, left eye: Secondary | ICD-10-CM

## 2016-04-25 DIAGNOSIS — Z01818 Encounter for other preprocedural examination: Secondary | ICD-10-CM | POA: Diagnosis not present

## 2016-04-25 DIAGNOSIS — Z96652 Presence of left artificial knee joint: Secondary | ICD-10-CM | POA: Diagnosis present

## 2016-04-25 DIAGNOSIS — Z7902 Long term (current) use of antithrombotics/antiplatelets: Secondary | ICD-10-CM

## 2016-04-25 DIAGNOSIS — I2510 Atherosclerotic heart disease of native coronary artery without angina pectoris: Secondary | ICD-10-CM | POA: Diagnosis present

## 2016-04-25 DIAGNOSIS — Z951 Presence of aortocoronary bypass graft: Secondary | ICD-10-CM

## 2016-04-25 DIAGNOSIS — S01111A Laceration without foreign body of right eyelid and periocular area, initial encounter: Secondary | ICD-10-CM | POA: Diagnosis not present

## 2016-04-25 DIAGNOSIS — M1990 Unspecified osteoarthritis, unspecified site: Secondary | ICD-10-CM | POA: Diagnosis present

## 2016-04-25 DIAGNOSIS — E876 Hypokalemia: Secondary | ICD-10-CM | POA: Diagnosis present

## 2016-04-25 DIAGNOSIS — S0510XA Contusion of eyeball and orbital tissues, unspecified eye, initial encounter: Secondary | ICD-10-CM | POA: Diagnosis not present

## 2016-04-25 DIAGNOSIS — I350 Nonrheumatic aortic (valve) stenosis: Secondary | ICD-10-CM | POA: Diagnosis not present

## 2016-04-25 DIAGNOSIS — I10 Essential (primary) hypertension: Secondary | ICD-10-CM | POA: Diagnosis not present

## 2016-04-25 DIAGNOSIS — S0083XA Contusion of other part of head, initial encounter: Secondary | ICD-10-CM

## 2016-04-25 DIAGNOSIS — R001 Bradycardia, unspecified: Secondary | ICD-10-CM | POA: Diagnosis present

## 2016-04-25 DIAGNOSIS — Z79899 Other long term (current) drug therapy: Secondary | ICD-10-CM | POA: Diagnosis not present

## 2016-04-25 DIAGNOSIS — I442 Atrioventricular block, complete: Secondary | ICD-10-CM | POA: Diagnosis not present

## 2016-04-25 DIAGNOSIS — Z955 Presence of coronary angioplasty implant and graft: Secondary | ICD-10-CM

## 2016-04-25 DIAGNOSIS — I459 Conduction disorder, unspecified: Secondary | ICD-10-CM | POA: Diagnosis not present

## 2016-04-25 DIAGNOSIS — R55 Syncope and collapse: Secondary | ICD-10-CM | POA: Diagnosis not present

## 2016-04-25 DIAGNOSIS — I110 Hypertensive heart disease with heart failure: Secondary | ICD-10-CM | POA: Diagnosis not present

## 2016-04-25 DIAGNOSIS — I517 Cardiomegaly: Secondary | ICD-10-CM | POA: Diagnosis not present

## 2016-04-25 DIAGNOSIS — I25810 Atherosclerosis of coronary artery bypass graft(s) without angina pectoris: Secondary | ICD-10-CM | POA: Diagnosis present

## 2016-04-25 DIAGNOSIS — I509 Heart failure, unspecified: Secondary | ICD-10-CM | POA: Diagnosis not present

## 2016-04-25 DIAGNOSIS — Z959 Presence of cardiac and vascular implant and graft, unspecified: Secondary | ICD-10-CM

## 2016-04-25 DIAGNOSIS — S0541XA Penetrating wound of orbit with or without foreign body, right eye, initial encounter: Secondary | ICD-10-CM | POA: Diagnosis not present

## 2016-04-25 LAB — HEPATIC FUNCTION PANEL
ALK PHOS: 72 U/L (ref 38–126)
ALT: 54 U/L (ref 17–63)
AST: 74 U/L — ABNORMAL HIGH (ref 15–41)
Albumin: 3.6 g/dL (ref 3.5–5.0)
BILIRUBIN DIRECT: 0.1 mg/dL (ref 0.1–0.5)
BILIRUBIN INDIRECT: 0.8 mg/dL (ref 0.3–0.9)
Total Bilirubin: 0.9 mg/dL (ref 0.3–1.2)
Total Protein: 5.7 g/dL — ABNORMAL LOW (ref 6.5–8.1)

## 2016-04-25 LAB — BASIC METABOLIC PANEL
ANION GAP: 8 (ref 5–15)
BUN: 22 mg/dL — ABNORMAL HIGH (ref 6–20)
CALCIUM: 9.2 mg/dL (ref 8.9–10.3)
CHLORIDE: 100 mmol/L — AB (ref 101–111)
CO2: 27 mmol/L (ref 22–32)
CREATININE: 0.89 mg/dL (ref 0.61–1.24)
GFR calc non Af Amer: >60 mL/min (ref >60)
Glucose, Bld: 137 mg/dL — ABNORMAL HIGH (ref 65–99)
Potassium: 3.2 mmol/L — ABNORMAL LOW (ref 3.5–5.1)
SODIUM: 135 mmol/L (ref 135–145)

## 2016-04-25 LAB — CBC WITH DIFFERENTIAL/PLATELET
BASOS PCT: 0 %
Basophils Absolute: 0 10*3/uL (ref 0.0–0.1)
EOS ABS: 0.1 10*3/uL (ref 0.0–0.7)
Eosinophils Relative: 2 %
HCT: 30.6 % — ABNORMAL LOW (ref 39.0–52.0)
HEMOGLOBIN: 10.5 g/dL — AB (ref 13.0–17.0)
LYMPHS ABS: 0.8 10*3/uL (ref 0.7–4.0)
Lymphocytes Relative: 10 %
MCH: 32.4 pg (ref 26.0–34.0)
MCHC: 34.3 g/dL (ref 30.0–36.0)
MCV: 94.4 fL (ref 78.0–100.0)
Monocytes Absolute: 0.6 10*3/uL (ref 0.1–1.0)
Monocytes Relative: 8 %
NEUTROS ABS: 6.8 10*3/uL (ref 1.7–7.7)
NEUTROS PCT: 80 %
Platelets: 154 10*3/uL (ref 150–400)
RBC: 3.24 MIL/uL — AB (ref 4.22–5.81)
RDW: 14.7 % (ref 11.5–15.5)
WBC: 8.3 10*3/uL (ref 4.0–10.5)

## 2016-04-25 LAB — MAGNESIUM: MAGNESIUM: 1.7 mg/dL (ref 1.7–2.4)

## 2016-04-25 MED ORDER — POTASSIUM CHLORIDE CRYS ER 20 MEQ PO TBCR
40.0000 meq | EXTENDED_RELEASE_TABLET | Freq: Once | ORAL | Status: AC
  Administered 2016-04-25: 40 meq via ORAL
  Filled 2016-04-25: qty 2

## 2016-04-25 MED ORDER — SODIUM CHLORIDE 0.9 % IV SOLN
INTRAVENOUS | Status: DC
  Administered 2016-04-25: 22:00:00 via INTRAVENOUS

## 2016-04-25 MED ORDER — TETANUS-DIPHTH-ACELL PERTUSSIS 5-2.5-18.5 LF-MCG/0.5 IM SUSP
0.5000 mL | Freq: Once | INTRAMUSCULAR | Status: AC
  Administered 2016-04-25: 0.5 mL via INTRAMUSCULAR
  Filled 2016-04-25: qty 0.5

## 2016-04-25 MED ORDER — NITROGLYCERIN 0.4 MG SL SUBL: 0.4000 mg | SUBLINGUAL_TABLET | SUBLINGUAL | Status: DC | PRN

## 2016-04-25 MED ORDER — ADULT MULTIVITAMIN W/MINERALS CH
ORAL_TABLET | Freq: Every day | ORAL | Status: DC
  Administered 2016-04-25 – 2016-04-28 (×3): 1 via ORAL
  Filled 2016-04-25 (×4): qty 1

## 2016-04-25 MED ORDER — LEVOTHYROXINE SODIUM 112 MCG PO TABS
112.0000 ug | ORAL_TABLET | Freq: Every day | ORAL | Status: DC
  Administered 2016-04-26 – 2016-04-28 (×3): 112 ug via ORAL
  Filled 2016-04-25 (×3): qty 1

## 2016-04-25 MED ORDER — ATORVASTATIN CALCIUM 20 MG PO TABS
20.0000 mg | ORAL_TABLET | Freq: Every evening | ORAL | Status: DC
  Administered 2016-04-25 – 2016-04-27 (×3): 20 mg via ORAL
  Filled 2016-04-25 (×3): qty 1

## 2016-04-25 MED ORDER — TAMSULOSIN HCL 0.4 MG PO CAPS
0.4000 mg | ORAL_CAPSULE | Freq: Every day | ORAL | Status: DC
  Administered 2016-04-26 – 2016-04-28 (×3): 0.4 mg via ORAL
  Filled 2016-04-25 (×4): qty 1

## 2016-04-25 MED ORDER — FUROSEMIDE 20 MG PO TABS
20.0000 mg | ORAL_TABLET | Freq: Every day | ORAL | Status: DC
  Administered 2016-04-26: 20 mg via ORAL
  Filled 2016-04-25 (×2): qty 1

## 2016-04-25 MED ORDER — LIDOCAINE-EPINEPHRINE (PF) 1 %-1:200000 IJ SOLN
INTRAMUSCULAR | Status: AC
  Filled 2016-04-25: qty 30

## 2016-04-25 MED ORDER — CLOPIDOGREL BISULFATE 75 MG PO TABS
75.0000 mg | ORAL_TABLET | Freq: Every day | ORAL | Status: DC
  Administered 2016-04-26: 75 mg via ORAL
  Filled 2016-04-25: qty 1

## 2016-04-25 MED ORDER — ONDANSETRON HCL 4 MG PO TABS: 4.0000 mg | ORAL_TABLET | Freq: Four times a day (QID) | ORAL | Status: DC | PRN

## 2016-04-25 MED ORDER — VITAMIN B-6 100 MG PO TABS
100.0000 mg | ORAL_TABLET | Freq: Every day | ORAL | Status: DC
  Administered 2016-04-26 – 2016-04-28 (×3): 100 mg via ORAL
  Filled 2016-04-25 (×4): qty 1

## 2016-04-25 MED ORDER — LATANOPROST 0.005 % OP SOLN
1.0000 [drp] | Freq: Every day | OPHTHALMIC | Status: DC
  Administered 2016-04-25 – 2016-04-27 (×3): 1 [drp] via OPHTHALMIC
  Filled 2016-04-25 (×2): qty 2.5

## 2016-04-25 MED ORDER — ONDANSETRON HCL 4 MG/2ML IJ SOLN: 4.0000 mg | Freq: Four times a day (QID) | INTRAMUSCULAR | Status: DC | PRN

## 2016-04-25 MED ORDER — SODIUM CHLORIDE 0.9% FLUSH
3.0000 mL | Freq: Two times a day (BID) | INTRAVENOUS | Status: DC
  Administered 2016-04-25 – 2016-04-26 (×2): 3 mL via INTRAVENOUS

## 2016-04-25 MED ORDER — ACETAMINOPHEN 650 MG RE SUPP: 650.0000 mg | Freq: Four times a day (QID) | RECTAL | Status: DC | PRN

## 2016-04-25 MED ORDER — ACETAMINOPHEN 325 MG PO TABS: 650.0000 mg | ORAL_TABLET | Freq: Four times a day (QID) | ORAL | Status: DC | PRN

## 2016-04-25 MED ORDER — IRBESARTAN 150 MG PO TABS
75.0000 mg | ORAL_TABLET | Freq: Every day | ORAL | Status: DC
  Administered 2016-04-26 – 2016-04-27 (×2): 75 mg via ORAL
  Filled 2016-04-25 (×5): qty 1

## 2016-04-25 MED ORDER — ASPIRIN EC 81 MG PO TBEC
81.0000 mg | DELAYED_RELEASE_TABLET | Freq: Every day | ORAL | Status: DC
Start: 2016-04-25 — End: 2016-04-28
  Administered 2016-04-26 – 2016-04-28 (×2): 81 mg via ORAL
  Filled 2016-04-25 (×3): qty 1

## 2016-04-25 MED ORDER — FERROUS SULFATE 325 (65 FE) MG PO TABS
ORAL_TABLET | Freq: Every day | ORAL | Status: DC
  Administered 2016-04-26: 10:00:00 via ORAL
  Administered 2016-04-27 – 2016-04-28 (×2): 325 mg via ORAL
  Filled 2016-04-25 (×4): qty 1

## 2016-04-25 MED ORDER — ALLOPURINOL 300 MG PO TABS
600.0000 mg | ORAL_TABLET | Freq: Every day | ORAL | Status: DC
  Administered 2016-04-26 – 2016-04-28 (×3): 600 mg via ORAL
  Filled 2016-04-25 (×4): qty 2

## 2016-04-25 NOTE — ED Notes (Signed)
[facility] called and informed us that they will send a truck as soon as one is available to transport to [facility].

## 2016-04-25 NOTE — ED Triage Notes (Addendum)
Pt reports falling at the gas station. Pt states that he is unsure if he passed out or not. Pt has laceration above RT eye brow. Abrasions to RT elbow and RT knee. Pt also has matted blood on back of head, but no injury noted. Pt has bruising to RT eyelid. Pt states he takes Plavix. Bleeding controlled at this time. AOx4.

## 2016-04-25 NOTE — ED Provider Notes (Signed)
Discussed case via phone with dr Johnsie [facility]ancel at [facility] He is aware of patient Advised wounds are still oozing but likely due to plavix therapy Recommend keeping bandage in place overnight and if continue to bleed consult trauma.     Ripley Fraise, MD 04/25/16 [phone]

## 2016-04-25 NOTE — Telephone Encounter (Signed)
Spoke with Chanetta Marshall, NP and she is going to make sure Dr [name] is aware

## 2016-04-25 NOTE — ED Provider Notes (Addendum)
[facility] DEPT Provider Note   CSN: [id number] Arrival date & time: 04/25/16  1122  By signing my name below, I, Higinio Plan, attest that this documentation has been prepared under the direction and in the presence of Ripley Fraise, MD . Electronically Signed: Higinio Plan, Scribe. 04/25/2016. 12:40 PM.  History   Chief Complaint Chief Complaint  Patient presents with  . Fall   The history is provided by the patient and the spouse. No language interpreter was used.  Fall  This is a new problem. The current episode started 1 to 2 hours ago. Associated symptoms include headaches. Pertinent negatives include no chest pain, no abdominal pain and no shortness of breath.  Laceration   The incident occurred 1 to 2 hours ago. The laceration is located on the right eye. The laceration mechanism is unknown.The pain is at a severity of 1/10. His tetanus status is unknown.   HPI Comments: [name] is a 82 y.o. male with PMHx of CAD, HTN and HLD, who presents to the Emergency Department for an evaluation of multiple abrasions and laceration surrounding his right orbit s/p a fall that occurred a few minutes PTA. 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 He notes multiple abrasions to his right arm and leg and significant bruising to his right eye. Pt's wife reports some matted blood on the back of his head; she notes there was a "pool of blood" around the area where pt fell and states this may be the source. Pt states he felt slightly short of breath before the incident but this has completely resolved now in the ED. He reports associated headache and pain on his right outer thigh. He denies chest pain, abdominal pain, neck or back pain and numbness in his extremities. Pt reports he is currently taking Plavix and aspirin. Per wife, pt has a bad valve in his heart and has an appointment with his heart  surgeon this afternoon. She notes he currently has a monitor on his heart. Pt states he is unsure of his last tetanus immunization.   Past Medical History:  Diagnosis Date  . Aortic stenosis    Mild, echo, August, 2012  . Arthritis   . Benign prostatic hypertrophy   . CAD (coronary artery disease)    DES, SVG to circumflex marginal, October, 2010 ( SVG to PDA and PLA patent , LIMA to LAD patent, EF 60%, mild inferior hypokinesis  . Carotid artery disease ([place])    DES, SVG to circumflex marginal, October, 2010 ( SVG to PDA and PLA patent , LIMA to LAD patent, EF 60%, mild inferior hypokinesis; H/o mild CHF, CABG  . GERD (gastroesophageal reflux disease)    takes daily  . HTN (hypertension)    takes meds daily  . Hx of decompressive lumbar laminectomy    Dr.Botero December, 2011  . Hyperlipidemia   . Hypothyroidism   . Nephrolithiasis   . Neuromuscular disorder ([place])   . Psoriasis    severe; with total skin exfoliation in the past resolved  . PVC's (premature ventricular contractions)    May, 2014  . Syncope    Remote, etiology unknown  . Ventricular tachycardia, non-sustained [facility])     Patient Active Problem List   Diagnosis Date Noted  . Paresthesia 01/20/2016  . Dyspnea 11/17/2015  . CAD (coronary artery disease) of bypass graft 11/17/2015  . Abnormal  cardiovascular stress test   . Spinal stenosis of lumbar region 07/22/2015  . Abnormality of gait 07/22/2015  . Transient complete heart block ([place]) 30-Oct-202015  . Hypertensive cardiovascular disease 30-Oct-202015  . Bradycardia 12/23/2013  . Aftercare following surgery of the circulatory system, [place] 06/30/2013  . PVC's (premature ventricular contractions)   . Neuromuscular disorder ([place])   . Nephrolithiasis   . Hypothyroidism   . Ventricular tachycardia, non-sustained ([place])   . Osteoarthritis of knee 04/19/2012  . Hyponatremia 04/19/2012  . Hypokalemia 04/19/2012  . Postoperative anemia due to acute blood loss 04/19/2012   . Aortic stenosis   . Occlusion and stenosis of carotid artery without mention of cerebral infarction 02/13/2012  . Carotid artery disease ([place])   . Hyperlipidemia   . HTN (hypertension)   . CAD (coronary artery disease)   . Syncope   . Psoriasis   . Ejection fraction   . Fluid overload   . Carpal tunnel syndrome   . Hx of CABG   . BENIGN PROSTATIC HYPERTROPHY, HX OF 06/11/2009    Past Surgical History:  Procedure Laterality Date  . APPENDECTOMY    . BACK SURGERY     x3  . bilateral L3-4 laminectomies    . CARDIAC CATHETERIZATION N/A 11/17/2015   Procedure: Right/Left Heart Cath and Coronary Angiography;  Surgeon: Peter M Martinique, MD;  Location: [facility] CV LAB;  Service: Cardiovascular;  Laterality: N/A;  . CARDIAC CATHETERIZATION N/A 11/17/2015   Procedure: Coronary Stent Intervention;  Surgeon: Peter M Martinique, MD;  Location: [facility] CV LAB;  Service: Cardiovascular;  Laterality: N/A;  . CARDIAC CATHETERIZATION N/A 04/19/2016   Procedure: Right/Left Heart Cath and Coronary/Graft Angiography;  Surgeon: [name], MD;  Location: [facility] CV LAB;  Service: Cardiovascular;  Laterality: N/A;  . [facility] Remote Monitoring  Oct. 29, 2015   by Dr. [name]  . CAROTID ENDARTERECTOMY Left 02-24-08   cea  Dr. Amedeo Plenty  . CARPAL TUNNEL RELEASE Bilateral 2010  . CATARACT EXTRACTION, BILATERAL    . COLONOSCOPY  11/23/2011   Procedure: COLONOSCOPY;  Surgeon: Rogene Houston, MD;  [facility];  Service: Endoscopy;  Laterality: N/A;  730  . CORONARY ARTERY BYPASS GRAFT  1995  . CORONARY STENT PLACEMENT  11/17/2015   SVG   DES to RCA  . decompression of left median nerve    . JOINT REPLACEMENT Left Sept. 27, 2013   Knee  . left carotid endarectomy and Dacron patch angioplasty]    . LEFT HEART CATHETERIZATION WITH CORONARY ANGIOGRAM N/A 12/27/2012   Procedure: LEFT HEART CATHETERIZATION WITH CORONARY ANGIOGRAM;  Surgeon: Burnell Blanks, MD;  Location: [facility] CATH  LAB;  Service: Cardiovascular;  Laterality: N/A;  . posterior arthrodesis with autograft and allograft    . removal of synovial cyst    . SPINE SURGERY     X's 3  . TEE WITHOUT CARDIOVERSION N/A 04/21/2016   Procedure: TRANSESOPHAGEAL ECHOCARDIOGRAM (TEE);  Surgeon: Pixie Casino, MD;  Location: [facility] ENDOSCOPY;  Service: Cardiovascular;  Laterality: N/A;  . TONSILLECTOMY    . TOTAL KNEE ARTHROPLASTY  04/16/2012   Procedure: TOTAL KNEE ARTHROPLASTY;  Surgeon: Garald Balding, MD;  Location: [facility];  Service: Orthopedics;  Laterality: Left;     Home Medications    Prior to Admission medications   Medication Sig Start Date End Date Taking? Authorizing Provider  allopurinol (ZYLOPRIM) 300 MG tablet Take 600 mg by mouth daily.     Historical Provider, MD  aspirin EC 81 MG tablet Take 81 mg by mouth daily.    Historical Provider, MD  atorvastatin (LIPITOR) 20 MG tablet Take 20 mg by mouth every evening.     Historical Provider, MD  clobetasol cream (TEMOVATE) 9.62 % Apply 1 application topically 2 (two) times daily. 11/05/15   Historical Provider, MD  clopidogrel (PLAVIX) 75 MG tablet Take 1 tablet (75 mg total) by mouth daily with breakfast. 11/18/15   Almyra Deforest, PA  diclofenac sodium (VOLTAREN) 1 % GEL Apply 4 g topically 4 (four) times daily. 01/20/16   Marcial Pacas, MD  Ferrous Sulfate (IRON) 325 (65 Fe) MG TABS Take 1 tablet by mouth daily.    Historical Provider, MD  fluticasone (FLONASE) 50 MCG/ACT nasal spray Place 1 spray into both nostrils 2 (two) times daily.    Historical Provider, MD  furosemide (LASIX) 20 MG tablet Take 20 mg by mouth daily.    Historical Provider, MD  latanoprost (XALATAN) 0.005 % ophthalmic solution Place 1 drop into both eyes at bedtime.    Historical Provider, MD  levothyroxine (SYNTHROID, LEVOTHROID) 112 MCG tablet Take 112 mcg by mouth daily before breakfast.    Historical Provider, MD  multivitamin [facility]) per tablet Take 1 tablet by mouth daily.     Historical  Provider, MD  nitroGLYCERIN (NITROSTAT) 0.4 MG SL tablet Place 0.4 mg under the tongue every 5 (five) minutes x 3 doses as needed. For chest pain    Historical Provider, MD  pyridOXINE (VITAMIN B-6) 100 MG tablet Take 100 mg by mouth daily.     Historical Provider, MD  Tamsulosin HCl (FLOMAX) 0.4 MG CAPS Take 0.4 mg by mouth daily.     Historical Provider, MD  valsartan (DIOVAN) 80 MG tablet Take 1 tablet (80 mg total) by mouth daily. 03/07/16   Herminio Commons, MD    Family History Family History  Problem Relation Age of Onset  . Heart attack Mother   . Heart disease Mother     Heart Disease before age 73  . Heart disease Father     Heart Disease before age 6  . Hypertension Father   . Hyperlipidemia Father   . Heart attack Son 38  . Colon cancer Neg Hx     Social History Social History  Substance Use Topics  . Smoking status: Never Smoker  . Smokeless tobacco: Never Used  . Alcohol use No     Comment: One beer per week.     Allergies   Propylene glycol   Review of Systems Review of Systems  Constitutional: Negative for fever.  Respiratory: Negative for shortness of breath.   Cardiovascular: Negative for chest pain.  Gastrointestinal: Negative for abdominal pain.  Musculoskeletal: Positive for arthralgias. Negative for back pain and neck pain.  Skin: Positive for wound.  Neurological: Positive for headaches. Negative for numbness.  All other systems reviewed and are negative.  Physical Exam Updated Vital Signs BP (!) 125/52 (BP Location: Left Arm)   Pulse 88   Temp 98.8 F (37.1 C) (Oral)   Resp 16   Ht _0  (1.727 m)   Wt 175 lb (79.4 kg)   SpO2 98%   BMI 26.61 kg/m   Physical Exam CONSTITUTIONAL: Well developed/well nourished HEAD: 2 lacerations surrounding right obrbit EYES: EOMI/PERRL right periorbital edema but no proptosis  No hyphema.  No evidence of globe injury to right eye.  No obvious foreign body noted ENMT: Mucous membranes moist,  laceration to right maxilla,  no septal hematoma or dental injury NECK: supple no meningeal signs SPINE/BACK:entire spine nontender, nexus criteria met  CV: S1/S2 noted, loud systolic ejection murmur that radiates to neck LUNGS: Lungs are clear to auscultation bilaterally, no apparent distress ABDOMEN: soft, nontender, no rebound or guarding, bowel sounds noted throughout abdomen GU:no cva tenderness NEURO: Pt is awake/alert/appropriate, moves all extremitiesx4.  No facial droop.   EXTREMITIES: pulses normal/equal, full ROM scattered abrasions to right arm and leg no laceration noted, All extremities/joints palpated/ranged and nontender SKIN: warm, color normal PSYCH: no abnormalities of mood noted, alert and oriented to situation  ED Treatments / Results  Labs (all labs ordered are listed, but only abnormal results are displayed) Labs Reviewed  BASIC METABOLIC PANEL - Abnormal; Notable for the following:       Result Value   Potassium 3.2 (*)    Chloride 100 (*)    Glucose, Bld 137 (*)    BUN 22 (*)    All other components within normal limits  CBC WITH DIFFERENTIAL/PLATELET - Abnormal; Notable for the following:    RBC 3.24 (*)    Hemoglobin 10.5 (*)    HCT 30.6 (*)    All other components within normal limits    EKG ED ECG REPORT   Date: 04/25/2016  Rate: 91  Rhythm: normal sinus rhythm  QRS Axis: normal  Intervals: normal  ST/T Wave abnormalities: nonspecific ST changes  Conduction Disutrbances:nonspecific intraventricular conduction delay  Narrative Interpretation: sinus pause  Old EKG Reviewed: unchanged  I have personally reviewed the EKG tracing and agree with the computerized printout as noted.  Radiology Ct Head Wo Contrast  Result Date: 04/25/2016 CLINICAL DATA:  82 year old male post fall at gas station. Unsure if passed out. Laceration right eyebrow. Initial encounter. EXAM: CT HEAD WITHOUT CONTRAST CT MAXILLOFACIAL WITHOUT CONTRAST TECHNIQUE: Multidetector  CT imaging of the head and maxillofacial structures were performed using the standard protocol without intravenous contrast. Multiplanar CT image reconstructions of the maxillofacial structures were also generated. COMPARISON:  12/11/2012 CT FINDINGS: CT HEAD FINDINGS Brain: No intracranial hemorrhage. Atrophy without hydrocephalus. Small vessel disease changes without CT evidence of large acute infarct. Vascular: Vascular calcifications. Skull: No skull fracture Sinuses/Orbits: Right preseptal hematoma.  Please see below. CT MAXILLOFACIAL FINDINGS Osseous: No acute facial or orbital fracture. Prominent degenerative changes C1-2 with transverse ligament hypertrophy. Prior surgery upper cervical spine with spinal stenosis incompletely assessed. Orbits: Post lens replacement. Globes appear to be grossly intact. Prominent right preseptal hematoma. Sinuses: Bony expansion along right upper molar. Mucosal thickening adjacent inferior right maxillary sinus measuring up to 3 mm. Soft tissues: Right preseptal hematoma as noted above. Hematoma extends anterior lateral to the right temporalis region. Limited intracranial: Atrophy. IMPRESSION: CT HEAD No intracranial hemorrhage or skull fracture. Atrophy. Small vessel disease changes without CT evidence of large acute infarct. CT MAXILLOFACIAL Prominent right preseptal hematoma without underlying orbital or facial fracture. Post lens replacement.  Globes appear to be grossly intact. Post upper cervical spine surgery. Cervical spondylotic changes contribute to spinal stenosis. Degenerative changes C1-2 articulation with transverse ligament hypertrophy. Electronically Signed   By: Genia Del M.D.   On: 04/25/2016 15:07   Ct Maxillofacial Wo Contrast  Result Date: 04/25/2016 CLINICAL DATA:  82 year old male post fall at gas station. Unsure if passed out. Laceration right eyebrow. Initial encounter. EXAM: CT HEAD WITHOUT CONTRAST CT MAXILLOFACIAL WITHOUT CONTRAST TECHNIQUE:  Multidetector CT imaging of the head and maxillofacial structures were performed using the standard protocol without intravenous  contrast. Multiplanar CT image reconstructions of the maxillofacial structures were also generated. COMPARISON:  12/11/2012 CT FINDINGS: CT HEAD FINDINGS Brain: No intracranial hemorrhage. Atrophy without hydrocephalus. Small vessel disease changes without CT evidence of large acute infarct. Vascular: Vascular calcifications. Skull: No skull fracture Sinuses/Orbits: Right preseptal hematoma.  Please see below. CT MAXILLOFACIAL FINDINGS Osseous: No acute facial or orbital fracture. Prominent degenerative changes C1-2 with transverse ligament hypertrophy. Prior surgery upper cervical spine with spinal stenosis incompletely assessed. Orbits: Post lens replacement. Globes appear to be grossly intact. Prominent right preseptal hematoma. Sinuses: Bony expansion along right upper molar. Mucosal thickening adjacent inferior right maxillary sinus measuring up to 3 mm. Soft tissues: Right preseptal hematoma as noted above. Hematoma extends anterior lateral to the right temporalis region. Limited intracranial: Atrophy. IMPRESSION: CT HEAD No intracranial hemorrhage or skull fracture. Atrophy. Small vessel disease changes without CT evidence of large acute infarct. CT MAXILLOFACIAL Prominent right preseptal hematoma without underlying orbital or facial fracture. Post lens replacement.  Globes appear to be grossly intact. Post upper cervical spine surgery. Cervical spondylotic changes contribute to spinal stenosis. Degenerative changes C1-2 articulation with transverse ligament hypertrophy. Electronically Signed   By: Genia Del M.D.   On: 04/25/2016 15:07    Procedures Procedures  LACERATION REPAIR Performed by: Sharyon Cable Consent: Verbal consent obtained. Risks and benefits: risks, benefits and alternatives were discussed Patient identity confirmed: provided demographic data Time  out performed prior to procedure Prepped and Draped in normal sterile fashion Wound explored Laceration Location: below right eye Laceration Length: 1cm No Foreign Bodies seen or palpated Anesthesia: local infiltration Local anesthetic: lidocaine with epinephrine Anesthetic total: 2 ml Amount of cleaning: standard Skin closure: simple Number of sutures or staples: 1 vicryl Technique: interrupted Patient tolerance: Patient tolerated the procedure well with no immediate complications.   LACERATION REPAIR Performed by: Sharyon Cable Consent: Verbal consent obtained. Risks and benefits: risks, benefits and alternatives were discussed Patient identity confirmed: provided demographic data Time out performed prior to procedure Prepped and Draped in normal sterile fashion Wound explored Laceration Location: right orbit Laceration Length: 3cm No Foreign Bodies seen or palpated Anesthesia: local infiltration Local anesthetic: lidocaine with epinephrine Anesthetic total: 3 ml Amount of cleaning: standard Skin closure: simple Number of sutures or staples: 3 vicryl Technique: interrupted Patient tolerance: Patient tolerated the procedure well with no immediate complications.  LACERATION REPAIR Performed by: Sharyon Cable Consent: Verbal consent obtained. Risks and benefits: risks, benefits and alternatives were discussed Patient identity confirmed: provided demographic data Time out performed prior to procedure Prepped and Draped in normal sterile fashion Wound explored Laceration Location: above right eyelid Laceration Length: 2cm No Foreign Bodies seen or palpated Anesthesia: local infiltration Local anesthetic: lidocaine % with epinephrine Anesthetic total: 2 ml Amount of cleaning: standard Skin closure: simple Number of sutures or staples: 3 Technique: vicryl Patient tolerance: Patient tolerated the procedure well with no immediate complications.  DIAGNOSTIC  STUDIES:  Oxygen Saturation is 98% on RA, normal by my interpretation.    COORDINATION OF CARE:  12:31 PM Discussed treatment plan with pt at bedside and pt agreed to plan.  Medications Ordered in ED Medications  lidocaine-EPINEPHrine (XYLOCAINE-EPINEPHrine) 1 %-1:200000 (PF) injection (not administered)  potassium chloride SA (K-DUR,KLOR-CON) CR tablet 40 mEq (not administered)  Tdap (BOOSTRIX) injection 0.5 mL (0.5 mLs Intramuscular Given 04/25/16 1341)     Initial Impression / Assessment and Plan / ED Course  I have reviewed the triage vital signs and the nursing notes.  Pertinent  labs  results that were available during my care of the patient were reviewed by me and considered in my medical decision making (see chart for details).  Clinical Course    12:45 PM Pt with h/o CAD and severe aortic stenosis, was supposed to see CT surgery today and probably will need TAVR soon.  Suspect today's episode due to syncope from AS.  He has loop recorder implanted, will see if we can interrogate and will need cardiology consult.    1:57 PM D/w dr Bronson Ing We reviewed case He feels with patient h/o severe Aortic stenosis and recurrent syncope, and he is supposed to see CT surgery today he requests I consult dr Cyndia Bent and will need evaluation today 2:28 PM D/w CT surgery nurse will help arrange admission 3:22 PM I spoke to [facility], on call for CT surgery She has d/w dr Cyndia Bent and dr cooper Plan to admit to dr cooper at [facility] and CT surgery consult Pt agreeable Pt stable No signs of head/facial/orbital injury   I personally performed the services described in this documentation, which was scribed in my presence. The recorded information has been reviewed and is accurate.   Final Clinical Impressions(s) / ED Diagnoses   Final diagnoses:  Syncope, unspecified syncope type  Aortic stenosis, severe  Concussion, with loss of consciousness of 30 minutes or less, initial encounter  Facial  contusion, initial encounter  Facial laceration, initial encounter  Forehead laceration, initial encounter    New Prescriptions New Prescriptions   No medications on file     Ripley Fraise, MD 04/25/16 Joaquin, MD 04/25/16 1530

## 2016-04-25 NOTE — Telephone Encounter (Signed)
New message  Pt wife call to inform RN and provider of pt falling this morning 9/5. Pt wife pt is in the hospital in [place] if it needs to be reviewed. Pt wife states if you need to call back please do on pt cell number.

## 2016-04-25 NOTE — H&P (Signed)
[name] is an 82 y.o. male.    Primary Cardiologist:Dr. Jacinta Shoe,  EP [facility] TAVR Dr. Burt Knack  PCP:  [name], MD  Chief Complaint:  Brought from [name] by [facility] today for syncope  HPI: 83 year ld male with hx of CAD with CABG 1995.  Also with saphenous vein graft PCI on 2 occasions, most recently in March 2017. He previously has been noted to have moderate aortic stenosis.   He was seen by Dr. Burt Knack after episodes of exertional syncope.  He has had echo , cath and ETT.   Currently he can only walk 1/8 of mile per Dr. Antionette Char note before he has to stop and rest due to exertional dyspnea.    After PCI in March his symptoms did not improve.  Also he has had 5 syncopal episodes over last 1-2 years.  He has a loop recorder.  Recent eval of ILR has documented some nocturnal bradycardia as well as NSVT. but no arrhythmias to correlate with symptoms of SOB or presyncope.  Also transient second degree AV block (noctural, without symptoms).  See below for tests that have been done. Dr. Burt Knack felt symptoms were related to AS.   He had repeat Rt and Lt heart cath 04/19/16 with results as below.  Then TEE was done 04/21/16.  Results as below.  Plan has been to refer to cardiac surgeon.    Today pt was at a gas station and fell, was unsure about syncope.  He sustained a laceration above Rt eyebrow.  Also abrasions to Rt elbow and Rt knee.  He had no chest pain and no SOB or abd pain.  Abrasion above eye oozing ER recommended keeping dressing on all night.    CT of head without intracranial hemorrhage or skull fracture, + atrophy, sm. Vessel disease without acute large CT infarct.   CT maxillofacial Prominent right preseptal hematoma without underlying orbital or facial fracture.  Post lens replacement.  Globes appear to be grossly intact.  Post upper cervical spine surgery. Cervical spondylotic changes contribute to spinal stenosis. Degenerative changes C1-2 articulation with  transverse ligament hypertrophy.  Labs K+ 3.2, glucose 137, Hgb 10.5, Hct 30.6.   EKG SR with first degree AV block and sinus pause vs. block.  No acute changes.    Currently stable without chest pain.    Past Medical History:  Diagnosis Date  . Aortic stenosis    Mild, echo, August, 2012  . Arthritis   . Benign prostatic hypertrophy   . CAD (coronary artery disease)    DES, SVG to circumflex marginal, October, 2010 ( SVG to PDA and PLA patent , LIMA to LAD patent, EF 60%, mild inferior hypokinesis  . Carotid artery disease ([place])    DES, SVG to circumflex marginal, October, 2010 ( SVG to PDA and PLA patent , LIMA to LAD patent, EF 60%, mild inferior hypokinesis; H/o mild CHF, CABG  . GERD (gastroesophageal reflux disease)    takes daily  . Head trauma 04/25/2016  . HTN (hypertension)    takes meds daily  . Hx of decompressive lumbar laminectomy    Dr.Botero December, 2011  . Hyperlipidemia   . Hypothyroidism   . Nephrolithiasis   . Neuromuscular disorder ([place])   . Psoriasis    severe; with total skin exfoliation in the past resolved  . PVC's (premature ventricular contractions)    May, 2014  . Syncope    Remote, etiology  unknown  . Ventricular tachycardia, non-sustained [facility])     Past Surgical History:  Procedure Laterality Date  . APPENDECTOMY    . BACK SURGERY     x3  . bilateral L3-4 laminectomies    . CARDIAC CATHETERIZATION N/A 11/17/2015   Procedure: Right/Left Heart Cath and Coronary Angiography;  Surgeon: Edell Mesenbrink M Martinique, MD;  Location: [place] [facility] CV LAB;  Service: Cardiovascular;  Laterality: N/A;  . CARDIAC CATHETERIZATION N/A 11/17/2015   Procedure: Coronary Stent Intervention;  Surgeon: Shade Kaley M Martinique, MD;  Location: [place] CV LAB;  Service: Cardiovascular;  Laterality: N/A;  . CARDIAC CATHETERIZATION N/A 04/19/2016   Procedure: Right/Left Heart Cath and Coronary/Graft Angiography;  Surgeon: [name], MD;  Location: [facility] CV LAB;  Service:  Cardiovascular;  Laterality: N/A;  . [facility] Remote Monitoring  Oct. 29, 2015   by Dr. [name]  . CAROTID ENDARTERECTOMY Left 02-24-08   cea  Dr. Amedeo Plenty  . CARPAL TUNNEL RELEASE Bilateral 2010  . CATARACT EXTRACTION, BILATERAL    . COLONOSCOPY  11/23/2011   Procedure: COLONOSCOPY;  Surgeon: Rogene Houston, MD;  [facility];  Service: Endoscopy;  Laterality: N/A;  730  . CORONARY ARTERY BYPASS GRAFT  1995  . CORONARY STENT PLACEMENT  11/17/2015   SVG   DES to RCA  . decompression of left median nerve    . JOINT REPLACEMENT Left Sept. 27, 2013   Knee  . left carotid endarectomy and Dacron patch angioplasty]    . LEFT HEART CATHETERIZATION WITH CORONARY ANGIOGRAM N/A 12/27/2012   Procedure: LEFT HEART CATHETERIZATION WITH CORONARY ANGIOGRAM;  Surgeon: Burnell Blanks, MD;  Location: [facility] CATH LAB;  Service: Cardiovascular;  Laterality: N/A;  . posterior arthrodesis with autograft and allograft    . removal of synovial cyst    . SPINE SURGERY     X's 3  . TEE WITHOUT CARDIOVERSION N/A 04/21/2016   Procedure: TRANSESOPHAGEAL ECHOCARDIOGRAM (TEE);  Surgeon: Pixie Casino, MD;  Location: [facility] ENDOSCOPY;  Service: Cardiovascular;  Laterality: N/A;  . TONSILLECTOMY    . TOTAL KNEE ARTHROPLASTY  04/16/2012   Procedure: TOTAL KNEE ARTHROPLASTY;  Surgeon: Garald Balding, MD;  Location: [facility];  Service: Orthopedics;  Laterality: Left;    Family History  Problem Relation Age of Onset  . Heart attack Mother   . Heart disease Mother     Heart Disease before age 68  . Heart disease Father     Heart Disease before age 27  . Hypertension Father   . Hyperlipidemia Father   . Heart attack Son 74  . Colon cancer Neg Hx    Social History:  reports that he has never smoked. He has never used smokeless tobacco. He reports that he does not drink alcohol or use drugs.  Allergies:  Allergies  Allergen Reactions  . Propylene Glycol Hives and Rash    Medications Prior to Admission   Medication Sig Dispense Refill  . allopurinol (ZYLOPRIM) 300 MG tablet Take 600 mg by mouth daily.     Marland Kitchen aspirin EC 81 MG tablet Take 81 mg by mouth daily.    Marland Kitchen atorvastatin (LIPITOR) 20 MG tablet Take 20 mg by mouth every evening.     . clobetasol cream (TEMOVATE) 1.70 % Apply 1 application topically 2 (two) times daily.    . clopidogrel (PLAVIX) 75 MG tablet Take 1 tablet (75 mg total) by mouth daily with breakfast. 90 tablet 3  . Ferrous Sulfate (IRON) 325 (  65 Fe) MG TABS Take 1 tablet by mouth daily.    . fluticasone (FLONASE) 50 MCG/ACT nasal spray Place 1 spray into both nostrils 2 (two) times daily.    . furosemide (LASIX) 20 MG tablet Take 20 mg by mouth daily.    Marland Kitchen latanoprost (XALATAN) 0.005 % ophthalmic solution Place 1 drop into both eyes at bedtime.    Marland Kitchen levothyroxine (SYNTHROID, LEVOTHROID) 112 MCG tablet Take 112 mcg by mouth daily before breakfast.    . multivitamin (THERAGRAN) per tablet Take 1 tablet by mouth daily.     . nitroGLYCERIN (NITROSTAT) 0.4 MG SL tablet Place 0.4 mg under the tongue every 5 (five) minutes x 3 doses as needed. For chest pain    . pyridOXINE (VITAMIN B-6) 100 MG tablet Take 100 mg by mouth daily.     . Tamsulosin HCl (FLOMAX) 0.4 MG CAPS Take 0.4 mg by mouth daily.     . valsartan (DIOVAN) 80 MG tablet Take 1 tablet (80 mg total) by mouth daily. 30 tablet 6    Results for orders placed or performed during the hospital encounter of 04/25/16 (from the past 48 hour(s))  Basic metabolic panel     Status: Abnormal   Collection Time: 04/25/16 12:49 PM  Result Value Ref Range   Sodium 135 135 - 145 mmol/L   Potassium 3.2 (L) 3.5 - 5.1 mmol/L   Chloride 100 (L) 101 - 111 mmol/L   CO2 27 22 - 32 mmol/L   Glucose, Bld 137 (H) 65 - 99 mg/dL   BUN 22 (H) 6 - 20 mg/dL   Creatinine, Ser 0.89 0.61 - 1.24 mg/dL   Calcium 9.2 8.9 - 10.3 mg/dL   GFR calc non Af Amer >60 >60 mL/min   GFR calc Af Amer >60 >60 mL/min    Comment: (NOTE) The eGFR has been  calculated using the CKD EPI equation. This calculation has not been validated in all clinical situations. eGFR's persistently <60 mL/min signify possible Chronic Kidney Disease.    Anion gap 8 5 - 15  CBC with Differential/Platelet     Status: Abnormal   Collection Time: 04/25/16 12:49 PM  Result Value Ref Range   WBC 8.3 4.0 - 10.5 K/uL   RBC 3.24 (L) 4.22 - 5.81 MIL/uL   Hemoglobin 10.5 (L) 13.0 - 17.0 g/dL   HCT 30.6 (L) 39.0 - 52.0 %   MCV 94.4 78.0 - 100.0 fL   MCH 32.4 26.0 - 34.0 pg   MCHC 34.3 30.0 - 36.0 g/dL   RDW 14.7 11.5 - 15.5 %   Platelets 154 150 - 400 K/uL   Neutrophils Relative % 80 %   Neutro Abs 6.8 1.7 - 7.7 K/uL   Lymphocytes Relative 10 %   Lymphs Abs 0.8 0.7 - 4.0 K/uL   Monocytes Relative 8 %   Monocytes Absolute 0.6 0.1 - 1.0 K/uL   Eosinophils Relative 2 %   Eosinophils Absolute 0.1 0.0 - 0.7 K/uL   Basophils Relative 0 %   Basophils Absolute 0.0 0.0 - 0.1 K/uL   Ct Head Wo Contrast  Result Date: 04/25/2016 CLINICAL DATA:  82 year old male post fall at gas station. Unsure if passed out. Laceration right eyebrow. Initial encounter. EXAM: CT HEAD WITHOUT CONTRAST CT MAXILLOFACIAL WITHOUT CONTRAST TECHNIQUE: Multidetector CT imaging of the head and maxillofacial structures were performed using the standard protocol without intravenous contrast. Multiplanar CT image reconstructions of the maxillofacial structures were also generated. COMPARISON:  12/11/2012 CT FINDINGS:  CT HEAD FINDINGS Brain: No intracranial hemorrhage. Atrophy without hydrocephalus. Small vessel disease changes without CT evidence of large acute infarct. Vascular: Vascular calcifications. Skull: No skull fracture Sinuses/Orbits: Right preseptal hematoma.  Please see below. CT MAXILLOFACIAL FINDINGS Osseous: No acute facial or orbital fracture. Prominent degenerative changes C1-2 with transverse ligament hypertrophy. Prior surgery upper cervical spine with spinal stenosis incompletely assessed.  Orbits: Post lens replacement. Globes appear to be grossly intact. Prominent right preseptal hematoma. Sinuses: Bony expansion along right upper molar. Mucosal thickening adjacent inferior right maxillary sinus measuring up to 3 mm. Soft tissues: Right preseptal hematoma as noted above. Hematoma extends anterior lateral to the right temporalis region. Limited intracranial: Atrophy. IMPRESSION: CT HEAD No intracranial hemorrhage or skull fracture. Atrophy. Small vessel disease changes without CT evidence of large acute infarct. CT MAXILLOFACIAL Prominent right preseptal hematoma without underlying orbital or facial fracture. Post lens replacement.  Globes appear to be grossly intact. Post upper cervical spine surgery. Cervical spondylotic changes contribute to spinal stenosis. Degenerative changes C1-2 articulation with transverse ligament hypertrophy. Electronically Signed   By: Genia Del M.D.   On: 04/25/2016 15:07   Ct Maxillofacial Wo Contrast  Result Date: 04/25/2016 CLINICAL DATA:  82 year old male post fall at gas station. Unsure if passed out. Laceration right eyebrow. Initial encounter. EXAM: CT HEAD WITHOUT CONTRAST CT MAXILLOFACIAL WITHOUT CONTRAST TECHNIQUE: Multidetector CT imaging of the head and maxillofacial structures were performed using the standard protocol without intravenous contrast. Multiplanar CT image reconstructions of the maxillofacial structures were also generated. COMPARISON:  12/11/2012 CT FINDINGS: CT HEAD FINDINGS Brain: No intracranial hemorrhage. Atrophy without hydrocephalus. Small vessel disease changes without CT evidence of large acute infarct. Vascular: Vascular calcifications. Skull: No skull fracture Sinuses/Orbits: Right preseptal hematoma.  Please see below. CT MAXILLOFACIAL FINDINGS Osseous: No acute facial or orbital fracture. Prominent degenerative changes C1-2 with transverse ligament hypertrophy. Prior surgery upper cervical spine with spinal stenosis  incompletely assessed. Orbits: Post lens replacement. Globes appear to be grossly intact. Prominent right preseptal hematoma. Sinuses: Bony expansion along right upper molar. Mucosal thickening adjacent inferior right maxillary sinus measuring up to 3 mm. Soft tissues: Right preseptal hematoma as noted above. Hematoma extends anterior lateral to the right temporalis region. Limited intracranial: Atrophy. IMPRESSION: CT HEAD No intracranial hemorrhage or skull fracture. Atrophy. Small vessel disease changes without CT evidence of large acute infarct. CT MAXILLOFACIAL Prominent right preseptal hematoma without underlying orbital or facial fracture. Post lens replacement.  Globes appear to be grossly intact. Post upper cervical spine surgery. Cervical spondylotic changes contribute to spinal stenosis. Degenerative changes C1-2 articulation with transverse ligament hypertrophy. Electronically Signed   By: Genia Del M.D.   On: 04/25/2016 15:07   Exercise tolerance test: Stress Findings   Stress Findings The patient exercised following a modified Bruce protocol.  The patient reported no symptoms during the stress test. The patient experienced no angina during the stress test.   The patient requested the test to be stopped. The test was stopped because the patient complained of fatigue.   Blood pressure and heart rate demonstrated a normal response to exercise. Blood pressure demonstrated a normal response to exercise. Overall, the patient's exercise capacity was normal.   85% of maximum heart rate was achieved after 2.1 minutes. Recovery time: 7 minutes. The patient's response to exercise was adequate for diagnosis.    Stress Measurements      Baseline Vitals  Rest HR 96 bpm    Rest BP 154/69 mmHg  Exercise Time  Exercise duration (min) 7 min    Exercise duration (sec) 48 sec    Peak Stress Vitals  Peak HR 121 bpm    Peak BP 157/61 mmHg    Exercise Data  MPHR 137 bpm      Percent HR 88 %    RPE 15     Estimated workload 4.4 METS          Echo 03-29-2016: Study Conclusions  - Left ventricle: The cavity size was normal. Wall thickness was increased in a pattern of mild LVH. Systolic function was low normal. The estimated ejection fraction was 50%. Features are consistent with a pseudonormal left ventricular filling pattern, with concomitant abnormal relaxation and increased filling pressure (grade 2 diastolic dysfunction). Doppler parameters are consistent with high ventricular filling pressure. - Regional wall motion abnormality: Hypokinesis of the mid inferior and mid inferolateral myocardium. - Aortic valve: Moderately thickened, moderately calcified leaflets. Cusp separation was severely reduced. There was moderate stenosis. There was mild regurgitation. Peak velocity (S): 302 cm/s. Mean gradient (S): 21 mm Hg. Valve area (VTI): 1.47 cm^2. Valve area (Vmax): 1.23 cm^2. Valve area (Vmean): 1.22 cm^2. - Mitral valve: Calcified annulus. There was moderate regurgitation. - Left atrium: The atrium was moderately dilated. - Right ventricle: The cavity size was mildly dilated. Systolic function was mildly reduced. - Tricuspid valve: Mildly thickened leaflets. There was mild regurgitation.  Cardiac catheterization 11/17/2015: Conclusion    Ost LM lesion, 100% stenosed.  Ost LAD to Prox LAD lesion, 100% stenosed.  Ost Cx to Mid Cx lesion, 100% stenosed.  Ost RCA to Dist RCA lesion, 100% stenosed.  LIMA was injected is normal in caliber, and is anatomically normal.  SVG was injected is normal in caliber.  Dist Graft lesion, 40% stenosed.  SVG was injected is normal in caliber, and is anatomically normal.  There is moderate left ventricular systolic dysfunction.  Origin lesion, 90% stenosed. Post intervention, there is a 0% residual stenosis.  1. Left main and RCA occlusive disease 2. Patent  LIMA to the LAD 3. Patent SVG to the OM 4. Patent SVG to the PDA with severe stenosis in the proximal SVG 5. Moderate LV dysfunction with EF 35% 6. Moderate pulmonary HTN with elevated LV filling pressures. 7. Mild to moderate aortic stenosis. Mean gradient 18 mm Hg. AVA 1.8 cm squared. 8. Successful stenting of the SVG to the RCA with DES.  Plan: DAPT for one year. Will start lasix 40 mg po daily for CHF. Optimize CHF therapy. Anticipate DC in am.    Carotid duplex scan 06/30/2015: Patent left carotid endarterectomy site and less than 40% stenosis of the right ICA   Cardiac CAth 04/19/16: Conclusion   1. Severe native vessel coronary artery disease with total occlusion of the left main coronary artery and right coronary artery 2. Status post CABG with continued patency of the LIMA to LAD, saphenous vein graft to OM, and saphenous vein graft to PDA with moderate in-stent restenosis in the PDA graft 3. Moderate to severe aortic stenosis with aortic valve calculations as outlined below 4. Large V waves raising suspicion for significant mitral regurgitation 5. Patient with narrow complex tachycardia heart rate approximately 110 120 bpm throughout the procedure, complicating hemodynamic assessment  Complicated case and patient who has coronary artery disease with moderate in-stent restenosis of the saphenous vein graft RCA, probable severe aortic stenosis, and conduction disease with frequent PVCs and possibly atrial tachycardia during his catheterization. Will review his case  with the multidisciplinary heart team including electrophysiology. I think it TEE would add important information about his valvular heart disease. He may have significant mitral regurgitation in addition to his aortic stenosis. After TEE would refer him to cardiac surgery for evaluation. If he has functional mitral regurgitation and his arrhythmia does not require further treatment, he may benefit from TAVR.   TEE  04/21/16: Study Conclusions  - Left ventricle: Mild LVH. Systolic function was mildly reduced.   The estimated ejection fraction was in the range of 45% to 50%.   Inferior and inferolateral hypokinesis. - Aortic valve: Sclerotic, rheumatic-appearing leaflets with   severely reduced excursion. The base of the leaflets are   calcified. There is severe stenosis. AVA is around 0.8-0.9 cm2.   Peak and mean gradients of 29 and 47 mmHg. AVA by planimetry was   0.88 cm2. There was heavy aliasing of color doppler in the   ascending aorta suggestive of severe aortic stenosis and high   velocity jet. There is trivial aortic insufficiency. Mean   gradient (S): 29 mm Hg. Peak gradient (S): 47 mm Hg. - Aorta: Grade 2 atheroma of the aortic arch. - Mitral valve: Mildly thickened leaflets . There was mild   regurgitation. - Left atrium: The atrium was dilated. No evidence of thrombus in   the atrial cavity or appendage. - Pulmonary veins: No anomaly. - Atrial septum: No defect or patent foramen ovale was identified. - Pulmonic valve: No evidence of vegetation.  Impressions:  - Severe aortic stenosis - rheumatic appearing valve. AVA around   0.8-0.9 cm2. Trivial AI. Mild mitral regurgitation. LVEF 45-50%   with inferior and inferolateral hypokinesis.     ROS: General:no colds or fevers, no weight changes Skin:no rashes or ulcers HEENT:no blurred vision, no congestion CV:see HPI PUL:see HPI GI:no diarrhea constipation or melena, no indigestion GU:no hematuria, no dysuria MS:no joint pain, no claudication Neuro:? syncope, no lightheadedness Endo:no diabetes, no thyroid disease  Blood pressure 122/62, pulse 97, temperature 98.3 F (36.8 C), temperature source Oral, resp. rate 16, height _0  (1.727 m), weight 175 lb 11.3 oz (79.7 kg), SpO2 99 %.  PE: see Dr. Kyla Balzarine progress note for physical  Assessment/Plan Principal Problem:   Aortic stenosis Active Problems:   Hyperlipidemia    HTN (hypertension)   CAD (coronary artery disease)   Syncope   Hypokalemia   Bradycardia   CAD (coronary artery disease) of bypass graft   Head trauma  Fall may be syncope secondary to arrhthymias vs. AS.  Will check loop in AM, EP is aware. Monitor overnight.  AS plan will be for CT per Dr. Burt Knack and cardiothoracic consult per Dr. Burt Knack.  CAD with recent stent in 10/2015 continue ASAS and plavix.  Head trauma. With surture repair of rt eye laceration,  Keep dressings in place and plan for trauma consult in AM.    Hypokalemia was replaced, will recheck in AM.  Anemia and with oozing of blood with abrasions will recheck CBC in AM.    Dr. Johnsie Cancel has seen and evaluated.     Cecilie Kicks Nurse Practitioner Certified [place] Pager [phone] or after 5pm or weekends call [phone] 04/25/2016, 6:39 PM   See separate progress note  Note that loop recorder showed CHB And patient will have pacer placed by Dr Brigid Re

## 2016-04-25 NOTE — ED Notes (Signed)
Report given to Megan, RN.

## 2016-04-25 NOTE — Progress Notes (Signed)
Patient sitting up in chair. Wife is present at bedside, no needs at this time. Call light within reach.

## 2016-04-25 NOTE — Progress Notes (Signed)
See full note by PA Patient examined and interviewed Discussed care with Dr Burt Knack and [facility] 82 y.o. with severe AS recent TEE and cath with stable CAD post CABG Multiple episodes of unexplained/ frequently exertional syncope Loop recorder does not show bradycardia.  Did have nocturnal 2:1 block Not related to symptoms.  To be evaluated for TAVR. Unprovoked syncope today with facial trauma and required suture repair Of right eye laceration. Xrays negative for fracture  Exam with NSR Compression dressing over right eye Severe AS murmur  Clear lungs  No edema  Telemetry :/ ECG with SR long PR IVCD PaC;s  Labs ok except K 3.2   No palpitations or chest pain or dyspnea preceding event.  Plan:  Syncope  Interrogate Loop in AM EP aware May need trauma service to look at laceration mild oozing from ASA/Plavix keep compression dressing on AS:  Dr Burt Knack to see and arrange inpatient consult with Dr Cyndia Bent and CTA to size annulus  With me latter in week  [name]

## 2016-04-25 NOTE — ED Notes (Signed)
Report given to [facility]. 

## 2016-04-26 ENCOUNTER — Other Ambulatory Visit: Payer: Self-pay | Admitting: *Deleted

## 2016-04-26 ENCOUNTER — Inpatient Hospital Stay (HOSPITAL_COMMUNITY): Payer: Medicare HMO

## 2016-04-26 DIAGNOSIS — I442 Atrioventricular block, complete: Principal | ICD-10-CM

## 2016-04-26 DIAGNOSIS — I350 Nonrheumatic aortic (valve) stenosis: Secondary | ICD-10-CM

## 2016-04-26 DIAGNOSIS — I459 Conduction disorder, unspecified: Secondary | ICD-10-CM

## 2016-04-26 LAB — BASIC METABOLIC PANEL
ANION GAP: 12 (ref 5–15)
BUN: 18 mg/dL (ref 6–20)
CALCIUM: 8.9 mg/dL (ref 8.9–10.3)
CO2: 28 mmol/L (ref 22–32)
Chloride: 95 mmol/L — ABNORMAL LOW (ref 101–111)
Creatinine, Ser: 1.02 mg/dL (ref 0.61–1.24)
GFR calc Af Amer: >60 mL/min (ref >60)
GLUCOSE: 122 mg/dL — AB (ref 65–99)
POTASSIUM: 3.8 mmol/L (ref 3.5–5.1)
SODIUM: 135 mmol/L (ref 135–145)

## 2016-04-26 LAB — CBC
HEMATOCRIT: 28.7 % — AB (ref 39.0–52.0)
Hemoglobin: 9.5 g/dL — ABNORMAL LOW (ref 13.0–17.0)
MCH: 31.7 pg (ref 26.0–34.0)
MCHC: 33.1 g/dL (ref 30.0–36.0)
MCV: 95.7 fL (ref 78.0–100.0)
Platelets: 156 10*3/uL (ref 150–400)
RBC: 3 MIL/uL — ABNORMAL LOW (ref 4.22–5.81)
RDW: 14.7 % (ref 11.5–15.5)
WBC: 7.8 10*3/uL (ref 4.0–10.5)

## 2016-04-26 MED ORDER — CEFAZOLIN SODIUM-DEXTROSE 2-4 GM/100ML-% IV SOLN
2.0000 g | INTRAVENOUS | Status: DC
  Filled 2016-04-26: qty 100

## 2016-04-26 MED ORDER — IOPAMIDOL (ISOVUE-370) INJECTION 76%
INTRAVENOUS | Status: AC
  Administered 2016-04-26: 80 mL via INTRAVENOUS
  Filled 2016-04-26: qty 100

## 2016-04-26 MED ORDER — IOPAMIDOL (ISOVUE-370) INJECTION 76%
INTRAVENOUS | Status: AC
  Administered 2016-04-26: 70 mL via INTRAVENOUS
  Filled 2016-04-26: qty 50

## 2016-04-26 MED ORDER — CHLORHEXIDINE GLUCONATE 4 % EX LIQD
60.0000 mL | Freq: Once | CUTANEOUS | Status: AC
  Administered 2016-04-27: 4 via TOPICAL
  Filled 2016-04-26: qty 60

## 2016-04-26 MED ORDER — SODIUM CHLORIDE 0.9 % IV SOLN
INTRAVENOUS | Status: DC
  Administered 2016-04-27: 06:00:00 via INTRAVENOUS

## 2016-04-26 MED ORDER — CHLORHEXIDINE GLUCONATE 4 % EX LIQD
60.0000 mL | Freq: Once | CUTANEOUS | Status: AC
  Administered 2016-04-26: 4 via TOPICAL
  Filled 2016-04-26: qty 60

## 2016-04-26 MED ORDER — SODIUM CHLORIDE 0.9 % IR SOLN
80.0000 mg | Status: AC
  Administered 2016-04-27: 80 mg
  Filled 2016-04-26: qty 2

## 2016-04-26 NOTE — Care Management Note (Signed)
Case Management Note Marvetta Gibbons RN, BSN Unit 2W-Case Manager [phone]  Patient Details  Name: LAMIR SYDNEY MRN: [id number] Date of Birth: 04-09-1931  Subjective/Objective:   Pt admitted with syncope and found to have CHB on loop recorder - plan for PPM on 9/7                 Action/Plan: PTA pt lived at home with wife- anticipate return home- CM to follow post procedure for d/c needs  Expected Discharge Date:                  Expected Discharge Plan:  Home/Self Care  In-House Referral:     Discharge planning Services  CM Consult  Post Acute Care Choice:    Choice offered to:     DME Arranged:    DME Agency:     HH Arranged:    [place] Agency:     Status of Service:  In process, will continue to follow  If discussed at Long Length of Stay Meetings, dates discussed:    Additional Comments:  Dawayne Patricia, RN 04/26/2016, 10:29 AM

## 2016-04-26 NOTE — Consult Note (Signed)
ELECTROPHYSIOLOGY CONSULT NOTE    Patient ID: [name] MRN: [id number], DOB/AGE: Jul 15, 1931 82 y.o.  Admit date: 04/25/2016 Date of Consult: 04/26/2016  Primary Physician: [name], MD Primary Cardiologist: Bronson Ing Electrophysiologist: [name] Requesting MD: Johnsie Cancel  Reason for Consultation: syncope   HPI:  [name] is a 82 y.o. male with a past medical history significant for aortic stenosis, CAD s/p CABG, HTN, hyperlipidemia, and recurrent syncope who underwent ILR implant.  He has had recurrent syncope with no arrhthymias documented on loop recorder and has been undergoing workup for TAVR.  He was walking yesterday out to his car after paying for gas when he began not feeling well.  This is typical of his symptoms of syncope in the past.  He abruptly loss consciousness and hit his head when he fell to the ground. He was taken to [facility] where he required stitches for his head laceration and transferred to [facility] for further evaluation.  Interrogation of his ILR this morning showed complete heart block at the time of syncopal spell. EP has been asked to evaluate for treatment options.  He currently denies chest pain, shortness of breath, LE edema, recent fevers, chills, nausea or vomiting. He did have a headache last night but no vision changes.   Past Medical History:  Diagnosis Date  . Aortic stenosis    Mild, echo, August, 2012  . Arthritis   . Benign prostatic hypertrophy   . CAD (coronary artery disease)    DES, SVG to circumflex marginal, October, 2010 ( SVG to PDA and PLA patent , LIMA to LAD patent, EF 60%, mild inferior hypokinesis  . Carotid artery disease ([place])    DES, SVG to circumflex marginal, October, 2010 ( SVG to PDA and PLA patent , LIMA to LAD patent, EF 60%, mild inferior hypokinesis; H/o mild CHF, CABG  . GERD (gastroesophageal reflux disease)    takes daily  . Head trauma 04/25/2016  . HTN (hypertension)    takes meds daily  . Hx of  decompressive lumbar laminectomy    Dr.Botero December, 2011  . Hyperlipidemia   . Hypothyroidism   . Nephrolithiasis   . Neuromuscular disorder ([place])   . Psoriasis    severe; with total skin exfoliation in the past resolved  . PVC's (premature ventricular contractions)    May, 2014  . Syncope    Remote, etiology unknown  . Ventricular tachycardia, non-sustained [facility])      Surgical History:  Past Surgical History:  Procedure Laterality Date  . APPENDECTOMY    . BACK SURGERY     x3  . bilateral L3-4 laminectomies    . CARDIAC CATHETERIZATION N/A 11/17/2015   Procedure: Right/Left Heart Cath and Coronary Angiography;  Surgeon: Peter M Martinique, MD;  Location: [place] CV LAB;  Service: Cardiovascular;  Laterality: N/A;  . CARDIAC CATHETERIZATION N/A 11/17/2015   Procedure: Coronary Stent Intervention;  Surgeon: Peter M Martinique, MD;  Location: [place] CV LAB;  Service: Cardiovascular;  Laterality: N/A;  . CARDIAC CATHETERIZATION N/A 04/19/2016   Procedure: Right/Left Heart Cath and Coronary/Graft Angiography;  Surgeon: [name], MD;  Location: [place] CV LAB;  Service: Cardiovascular;  Laterality: N/A;  . [facility] Remote Monitoring  Oct. 29, 2015   by Dr. [name]  . CAROTID ENDARTERECTOMY Left 02-24-08   cea  Dr. Amedeo Plenty  . CARPAL TUNNEL RELEASE Bilateral 2010  . CATARACT EXTRACTION, BILATERAL    . COLONOSCOPY  11/23/2011   Procedure: COLONOSCOPY;  Surgeon: Bernadene Person  Gloriann Loan, MD;  Location[facility];  Service: Endoscopy;  Laterality: N/A;  730  . CORONARY ARTERY BYPASS GRAFT  1995  . CORONARY STENT PLACEMENT  11/17/2015   SVG   DES to RCA  . decompression of left median nerve    . JOINT REPLACEMENT Left Sept. 27, 2013   Knee  . left carotid endarectomy and Dacron patch angioplasty]    . LEFT HEART CATHETERIZATION WITH CORONARY ANGIOGRAM N/A 12/27/2012   Procedure: LEFT HEART CATHETERIZATION WITH CORONARY ANGIOGRAM;  Surgeon: Burnell Blanks, MD;  Location:  [facility] CATH LAB;  Service: Cardiovascular;  Laterality: N/A;  . posterior arthrodesis with autograft and allograft    . removal of synovial cyst    . SPINE SURGERY     X's 3  . TEE WITHOUT CARDIOVERSION N/A 04/21/2016   Procedure: TRANSESOPHAGEAL ECHOCARDIOGRAM (TEE);  Surgeon: Pixie Casino, MD;  Location: [facility] ENDOSCOPY;  Service: Cardiovascular;  Laterality: N/A;  . TONSILLECTOMY    . TOTAL KNEE ARTHROPLASTY  04/16/2012   Procedure: TOTAL KNEE ARTHROPLASTY;  Surgeon: Garald Balding, MD;  Location: [place];  Service: Orthopedics;  Laterality: Left;     Prescriptions Prior to Admission  Medication Sig Dispense Refill Last Dose  . allopurinol (ZYLOPRIM) 300 MG tablet Take 600 mg by mouth daily.    04/25/2016 at Unknown time  . aspirin EC 81 MG tablet Take 81 mg by mouth daily.   04/25/2016 at Unknown time  . atorvastatin (LIPITOR) 20 MG tablet Take 20 mg by mouth every evening.    04/24/2016 at Unknown time  . clobetasol cream (TEMOVATE) [id number] % Apply 1 application topically 2 (two) times daily.   04/25/2016 at Unknown time  . clopidogrel (PLAVIX) 75 MG tablet Take 1 tablet (75 mg total) by mouth daily with breakfast. 90 tablet 3 04/25/2016 at Unknown time  . Ferrous Sulfate (IRON) 325 (65 Fe) MG TABS Take 1 tablet by mouth daily.   04/25/2016 at Unknown time  . fluticasone (FLONASE) 50 MCG/ACT nasal spray Place 1 spray into both nostrils 2 (two) times daily.   04/25/2016 at Unknown time  . furosemide (LASIX) 20 MG tablet Take 20 mg by mouth daily.   04/25/2016 at Unknown time  . latanoprost (XALATAN) 0.005 % ophthalmic solution Place 1 drop into both eyes at bedtime.   04/24/2016 at Unknown time  . levothyroxine (SYNTHROID, LEVOTHROID) 112 MCG tablet Take 112 mcg by mouth daily before breakfast.   04/25/2016 at Unknown time  . multivitamin (THERAGRAN) per tablet Take 1 tablet by mouth daily.    04/24/2016 at Unknown time  . nitroGLYCERIN (NITROSTAT) 0.4 MG SL tablet Place 0.4 mg under the tongue every 5 (five) minutes  x 3 doses as needed. For chest pain   unknown  . pyridOXINE (VITAMIN B-6) 100 MG tablet Take 100 mg by mouth daily.    04/25/2016 at Unknown time  . Tamsulosin HCl (FLOMAX) 0.4 MG CAPS Take 0.4 mg by mouth daily.    04/25/2016 at Unknown time  . valsartan (DIOVAN) 80 MG tablet Take 1 tablet (80 mg total) by mouth daily. 30 tablet 6 04/25/2016 at Unknown time    Inpatient Medications:  . allopurinol  600 mg Oral Daily  . aspirin EC  81 mg Oral Daily  . atorvastatin  20 mg Oral QPM  . clopidogrel  75 mg Oral Q breakfast  . ferrous sulfate   Oral Daily  . furosemide  20 mg Oral Daily  . irbesartan  75 mg Oral Daily  . latanoprost  1 drop Both Eyes QHS  . levothyroxine  112 mcg Oral QAC breakfast  . multivitamin with minerals   Oral Daily  . pyridOXINE  100 mg Oral Daily  . sodium chloride flush  3 mL Intravenous Q12H  . tamsulosin  0.4 mg Oral Daily    Allergies:  Allergies  Allergen Reactions  . Propylene Glycol Hives and Rash    Social History   Social History  . Marital status: Married    Spouse name: N/A  . Number of children: 2  . Years of education: 16   Occupational History  . Retired    Social History Main Topics  . Smoking status: Never Smoker  . Smokeless tobacco: Never Used  . Alcohol use No     Comment: One beer per week.  . Drug use: No  . Sexual activity: Not Currently   Other Topics Concern  . Not on file   Social History Narrative   Lives at home with wife.   1 cup coffee per day.   Right-handed.     Family History  Problem Relation Age of Onset  . Heart attack Mother   . Heart disease Mother     Heart Disease before age 38  . Heart disease Father     Heart Disease before age 65  . Hypertension Father   . Hyperlipidemia Father   . Heart attack Son 30  . Colon cancer Neg Hx      Review of Systems: All other systems reviewed and are otherwise negative except as noted above.  Physical Exam: Vitals:   04/25/16 1639 04/25/16 1802 04/25/16  2048 04/26/16 0417  BP: 137/74 122/62 (!) 91/44 (!) 109/48  Pulse: 98 97 86 79  Resp: 18 16 16 14   Temp: 98.4 F (36.9 C) 98.3 F (36.8 C) 98.3 F (36.8 C) 98.4 F (36.9 C)  TempSrc: Oral Oral Oral Oral  SpO2: 97% 99% 96% 97%  Weight:  175 lb 11.3 oz (79.7 kg)    Height:  5\' 8"  (1.727 m)      GEN- The patient is elderly appearing, alert and oriented x 3 today.   HEENT: normocephalic, atraumatic; sclera clear, conjunctiva pink; hearing intact; oropharynx clear; neck supple, head wrapped with large bandage  Lungs- Clear to ausculation bilaterally, normal work of breathing.  No wheezes, rales, rhonchi Heart- Regular rate and rhythm, 3/6SEM GI- soft, non-tender, non-distended, bowel sounds present Extremities- no clubbing, cyanosis, or edema; DP/PT/radial pulses 2+ bilaterally MS- no significant deformity or atrophy Skin- warm and dry, no rash or lesion Psych- euthymic mood, full affect Neuro- strength and sensation are intact  Labs:   Lab Results  Component Value Date   WBC 7.8 04/26/2016   HGB 9.5 (L) 04/26/2016   HCT 28.7 (L) 04/26/2016   MCV 95.7 04/26/2016   PLT 156 04/26/2016    Recent Labs Lab 04/25/16 1953 04/26/16 0410  NA  --  135  K  --  3.8  CL  --  95*  CO2  --  28  BUN  --  18  CREATININE  --  1.02  CALCIUM  --  8.9  PROT 5.7*  --   BILITOT 0.9  --   ALKPHOS 72  --   ALT 54  --   AST 74*  --   GLUCOSE  --  122*      Radiology/Studies: Ct Head Wo Contrast Result Date: 04/25/2016 CLINICAL DATA:  82 year old male post fall at gas station. Unsure if passed out. Laceration right eyebrow. Initial encounter. EXAM: CT HEAD WITHOUT CONTRAST CT MAXILLOFACIAL WITHOUT CONTRAST TECHNIQUE: Multidetector CT imaging of the head and maxillofacial structures were performed using the standard protocol without intravenous contrast. Multiplanar CT image reconstructions of the maxillofacial structures were also generated. COMPARISON:  12/11/2012 CT FINDINGS: CT HEAD  FINDINGS Brain: No intracranial hemorrhage. Atrophy without hydrocephalus. Small vessel disease changes without CT evidence of large acute infarct. Vascular: Vascular calcifications. Skull: No skull fracture Sinuses/Orbits: Right preseptal hematoma.  Please see below. CT MAXILLOFACIAL FINDINGS Osseous: No acute facial or orbital fracture. Prominent degenerative changes C1-2 with transverse ligament hypertrophy. Prior surgery upper cervical spine with spinal stenosis incompletely assessed. Orbits: Post lens replacement. Globes appear to be grossly intact. Prominent right preseptal hematoma. Sinuses: Bony expansion along right upper molar. Mucosal thickening adjacent inferior right maxillary sinus measuring up to 3 mm. Soft tissues: Right preseptal hematoma as noted above. Hematoma extends anterior lateral to the right temporalis region. Limited intracranial: Atrophy. IMPRESSION: CT HEAD No intracranial hemorrhage or skull fracture. Atrophy. Small vessel disease changes without CT evidence of large acute infarct. CT MAXILLOFACIAL Prominent right preseptal hematoma without underlying orbital or facial fracture. Post lens replacement.  Globes appear to be grossly intact. Post upper cervical spine surgery. Cervical spondylotic changes contribute to spinal stenosis. Degenerative changes C1-2 articulation with transverse ligament hypertrophy. Electronically Signed   By: Genia Del M.D.   On: 04/25/2016 15:07   [id number] rhythm, rate 91, first degree AV block, IVCD  TELEMETRY: sinus rhythm  Assessment/Plan: 1.  Complete heart block with syncope The patient has had syncope now with documented complete heart block.  He is on no AVN blocking agents and will need pacemaker implantation. Risks, benefits reviewed with the patient today who wishes to proceed. Will plan for tomorrow afternoon with Dr [name].    2.  Aortic stenosis Plan for CT today to further evaluate Further TAVR workup per Dr Cooper/Bartle  3.   HTN Stable No change required today Avoid AVN blocking agents    Signed, Chanetta Marshall, NP 04/26/2016 7:36 AM  I have seen, examined the patient, and reviewed the above assessment and plan.  On exam, RRR.  Changes to above are made where necessary.  The patient has symptomatic mobitz II second degree AV block documented during his most recent syncopal event. No reversible causes are found.  I would therefore recommend pacemaker implantation at this time.  Given EF 45% and anticipation of >40% RV pacing, I would advise resynchronization therapy also.  Risks, benefits, alternatives to biventricular pacemaker implantation with implantable loop recorder removal were discussed in detail with the patient today. The patient understands that the risks include but are not limited to bleeding, infection, pneumothorax, perforation, tamponade, vascular damage, renal failure, MI, stroke, death,  and lead dislodgement and wishes to proceed. He is left handed but given anticipated anatomy, agrees with L sided device implantation.   Co Sign: [name], MD 04/27/2016 10:32 AM

## 2016-04-26 NOTE — Progress Notes (Signed)
    Subjective:  The patient is doing okay today. Denies chest pain or dyspnea. Not having much pain in his head from his fall yesterday.  Objective:  Vital Signs in the last 24 hours: Temp:  [98.3 F (36.8 C)-98.8 F (37.1 C)] 98.4 F (36.9 C) (09/06 0417) Pulse Rate:  [79-109] 79 (09/06 0417) Resp:  [11-24] 14 (09/06 0417) BP: (91-141)/(44-123) 109/48 (09/06 0417) SpO2:  [96 %-99 %] 97 % (09/06 0417) Weight:  [79.4 kg (175 lb)-79.7 kg (175 lb 11.3 oz)] 79.7 kg (175 lb 11.3 oz) (09/05 1802)  Intake/Output from previous day: 09/05 0701 - 09/06 0700 In: -  Out: 125 [Urine:125]  Physical Exam: Pt is alert and oriented, NAD HEENT: The patient's scalp is wrapped in a dressing Neck: JVP - normal Lungs: CTA bilaterally CV: RRR grade 3/6 harsh systolic murmur at the right upper sternal border radiating to the neck Abd: soft, NT, Positive BS, no hepatomegaly Ext: no C/C/E, distal pulses intact and equal Skin: warm/dry no rash. Abrasions right knee.   Lab Results:  Recent Labs  04/25/16 1249 04/26/16 0410  WBC 8.3 7.8  HGB 10.5* 9.5*  PLT 154 156    Recent Labs  04/25/16 1249 04/26/16 0410  NA 135 135  K 3.2* 3.8  CL 100* 95*  CO2 27 28  GLUCOSE 137* 122*  BUN 22* 18  CREATININE 0.89 1.02   No results for input(s): TROPONINI in the last 72 hours.  Invalid input(s): CK, MB  Tele: Personally reviewed, sinus rhythm  Assessment/Plan:  1. Syncope with documentation of complete heart block: The patient has been off of all AV nodal blocking agents. Plans for permanent pacemaker implantation tomorrow. Appreciate EP team evaluation.  2. Severe symptomatic aortic stenosis, stage D: The patient has undergone extensive evaluation as previously documented including right and left heart catheterization, dobutamine testing, echocardiography, transesophageal echocardiography. All of his studies have been reviewed. Findings are consistent with severe aortic stenosis. Will  continue evaluation for TAVR. Dr. Cyndia Bent will see for surgical consultation and CT angiogram studies will be ordered. Probably will be best to let him recover from his pacemaker implantation, reassess symptoms, and move forward with TAVR in 3-4 weeks.  3. Hypertension: Blood pressure controlled. Avoid AV nodal blocking agents  4. CAD, native vessel and graft disease: Patient does have restenosis in the saphenous vein graft RCA, plan continued medical therapy for now. He is having no symptoms of angina.   [name], M.D. 04/26/2016, 8:23 AM

## 2016-04-26 NOTE — Progress Notes (Signed)
Patient with facial lacerations after a mechanical fall froma syncopal episode.  He is getting a pacemaker tomorrow.  Right supraorbital V-shaped laceration is closed well and seems viable.  One zygomatic lac not sutured, but I placed Dermabond on it today.  I cleaned off his eye and his vision is subjectively fine.  Abrasions and skin tears of his right knee just require local cleansing and triple antibiotic ointment daily.  No further repairs seem necessary.  Kathryne Eriksson. Dahlia Bailiff, MD, [place] [phone] [facility] Surgery

## 2016-04-26 NOTE — Progress Notes (Unsigned)
pfts

## 2016-04-27 ENCOUNTER — Encounter (HOSPITAL_COMMUNITY)
Admission: EM | Disposition: A | Discharge status: Home / Self Care | Payer: Self-pay | Source: Home / Self Care | Attending: Cardiovascular Disease

## 2016-04-27 ENCOUNTER — Inpatient Hospital Stay (HOSPITAL_COMMUNITY): Payer: Medicare HMO

## 2016-04-27 DIAGNOSIS — I25810 Atherosclerosis of coronary artery bypass graft(s) without angina pectoris: Secondary | ICD-10-CM | POA: Diagnosis not present

## 2016-04-27 DIAGNOSIS — E039 Hypothyroidism, unspecified: Secondary | ICD-10-CM | POA: Diagnosis not present

## 2016-04-27 DIAGNOSIS — S0181XA Laceration without foreign body of other part of head, initial encounter: Secondary | ICD-10-CM | POA: Diagnosis not present

## 2016-04-27 DIAGNOSIS — I441 Atrioventricular block, second degree: Secondary | ICD-10-CM

## 2016-04-27 DIAGNOSIS — S060X1A Concussion with loss of consciousness of 30 minutes or less, initial encounter: Secondary | ICD-10-CM | POA: Diagnosis not present

## 2016-04-27 DIAGNOSIS — D649 Anemia, unspecified: Secondary | ICD-10-CM | POA: Diagnosis not present

## 2016-04-27 DIAGNOSIS — I5022 Chronic systolic (congestive) heart failure: Secondary | ICD-10-CM

## 2016-04-27 DIAGNOSIS — I350 Nonrheumatic aortic (valve) stenosis: Secondary | ICD-10-CM | POA: Diagnosis not present

## 2016-04-27 DIAGNOSIS — I509 Heart failure, unspecified: Secondary | ICD-10-CM | POA: Diagnosis not present

## 2016-04-27 DIAGNOSIS — E785 Hyperlipidemia, unspecified: Secondary | ICD-10-CM | POA: Diagnosis not present

## 2016-04-27 DIAGNOSIS — I110 Hypertensive heart disease with heart failure: Secondary | ICD-10-CM | POA: Diagnosis not present

## 2016-04-27 DIAGNOSIS — I442 Atrioventricular block, complete: Secondary | ICD-10-CM | POA: Diagnosis not present

## 2016-04-27 HISTORY — PX: EP IMPLANTABLE DEVICE: SHX172B

## 2016-04-27 LAB — PULMONARY FUNCTION TEST
DL/VA % PRED: 115 %
DL/VA: 5.14 ml/min/mmHg/L
DLCO COR: 21.68 ml/min/mmHg
DLCO UNC % PRED: 59 %
DLCO UNC: 17.76 ml/min/mmHg
DLCO cor % pred: 73 %
FEF 25-75 PRE: 1.39 L/s
FEF 25-75 Post: 1.28 L/sec
FEF2575-%CHANGE-POST: -7 %
FEF2575-%PRED-PRE: 84 %
FEF2575-%Pred-Post: 78 %
FEV1-%Change-Post: 0 %
FEV1-%PRED-POST: 75 %
FEV1-%PRED-PRE: 75 %
FEV1-POST: 1.89 L
FEV1-Pre: 1.89 L
FEV1FVC-%CHANGE-POST: 7 %
FEV1FVC-%Pred-Pre: 105 %
FEV6-%CHANGE-POST: -4 %
FEV6-%PRED-PRE: 75 %
FEV6-%Pred-Post: 71 %
FEV6-Post: 2.36 L
FEV6-Pre: 2.48 L
FEV6FVC-%CHANGE-POST: 1 %
FEV6FVC-%Pred-Post: 107 %
FEV6FVC-%Pred-Pre: 105 %
FVC-%Change-Post: -6 %
FVC-%Pred-Post: 66 %
FVC-%Pred-Pre: 71 %
FVC-Post: 2.37 L
FVC-Pre: 2.53 L
POST FEV1/FVC RATIO: 80 %
POST FEV6/FVC RATIO: 100 %
PRE FEV1/FVC RATIO: 75 %
Pre FEV6/FVC Ratio: 98 %
RV % pred: 67 %
RV: 1.75 L
TLC % PRED: 65 %
TLC: 4.34 L

## 2016-04-27 LAB — SURGICAL PCR SCREEN
MRSA, PCR: NEGATIVE
STAPHYLOCOCCUS AUREUS: NEGATIVE

## 2016-04-27 SURGERY — PACEMAKER IMPLANT
Anesthesia: LOCAL

## 2016-04-27 MED ORDER — SODIUM CHLORIDE 0.9 % IV SOLN
INTRAVENOUS | Status: DC | PRN
  Administered 2016-04-27: 250 mL via INTRAVENOUS

## 2016-04-27 MED ORDER — LIDOCAINE HCL (PF) 1 % IJ SOLN
INTRAMUSCULAR | Status: AC
  Filled 2016-04-27: qty 60

## 2016-04-27 MED ORDER — SODIUM CHLORIDE 0.9% FLUSH
3.0000 mL | Freq: Two times a day (BID) | INTRAVENOUS | Status: DC
  Administered 2016-04-28: 3 mL via INTRAVENOUS

## 2016-04-27 MED ORDER — MIDAZOLAM HCL 5 MG/5ML IJ SOLN
INTRAMUSCULAR | Status: DC | PRN
  Administered 2016-04-27 (×2): 1 mg via INTRAVENOUS

## 2016-04-27 MED ORDER — SODIUM CHLORIDE 0.9% FLUSH: 3.0000 mL | INTRAVENOUS | Status: DC | PRN

## 2016-04-27 MED ORDER — PSYLLIUM 95 % PO PACK
1.0000 | PACK | Freq: Every day | ORAL | Status: DC
  Administered 2016-04-27 – 2016-04-28 (×2): 1 via ORAL
  Filled 2016-04-27 (×2): qty 1

## 2016-04-27 MED ORDER — IOPAMIDOL (ISOVUE-370) INJECTION 76%
INTRAVENOUS | Status: AC
  Filled 2016-04-27: qty 50

## 2016-04-27 MED ORDER — METOPROLOL SUCCINATE ER 50 MG PO TB24
50.0000 mg | ORAL_TABLET | Freq: Every day | ORAL | Status: DC
  Administered 2016-04-27: 50 mg via ORAL
  Filled 2016-04-27 (×2): qty 1

## 2016-04-27 MED ORDER — ONDANSETRON HCL 4 MG/2ML IJ SOLN: 4.0000 mg | Freq: Four times a day (QID) | INTRAMUSCULAR | Status: DC | PRN

## 2016-04-27 MED ORDER — CEFAZOLIN IN D5W 1 GM/50ML IV SOLN
1.0000 g | Freq: Four times a day (QID) | INTRAVENOUS | Status: AC
  Administered 2016-04-27 – 2016-04-28 (×3): 1 g via INTRAVENOUS
  Filled 2016-04-27 (×3): qty 50

## 2016-04-27 MED ORDER — IOPAMIDOL (ISOVUE-370) INJECTION 76%
INTRAVENOUS | Status: DC | PRN
  Administered 2016-04-27: 15 mL via INTRAVENOUS

## 2016-04-27 MED ORDER — LIDOCAINE HCL (PF) 1 % IJ SOLN
INTRAMUSCULAR | Status: DC | PRN
  Administered 2016-04-27: 60 mL via INTRADERMAL

## 2016-04-27 MED ORDER — SODIUM CHLORIDE 0.9 % IV SOLN: 250.0000 mL | INTRAVENOUS | Status: DC | PRN

## 2016-04-27 MED ORDER — HEPARIN (PORCINE) IN NACL 2-0.9 UNIT/ML-% IJ SOLN
INTRAMUSCULAR | Status: AC
  Filled 2016-04-27: qty 500

## 2016-04-27 MED ORDER — HYDROCODONE-ACETAMINOPHEN 5-325 MG PO TABS: 1.0000 | ORAL_TABLET | ORAL | Status: DC | PRN

## 2016-04-27 MED ORDER — SODIUM CHLORIDE 0.9 % IR SOLN
Status: AC
  Filled 2016-04-27: qty 2

## 2016-04-27 MED ORDER — HEPARIN (PORCINE) IN NACL 2-0.9 UNIT/ML-% IJ SOLN
INTRAMUSCULAR | Status: DC | PRN
  Administered 2016-04-27: 14:00:00

## 2016-04-27 MED ORDER — ALBUTEROL SULFATE (2.5 MG/3ML) 0.083% IN NEBU
2.5000 mg | INHALATION_SOLUTION | Freq: Once | RESPIRATORY_TRACT | Status: AC
  Administered 2016-04-27: 2.5 mg via RESPIRATORY_TRACT

## 2016-04-27 MED ORDER — CEFAZOLIN SODIUM-DEXTROSE 2-4 GM/100ML-% IV SOLN
2.0000 g | INTRAVENOUS | Status: AC
  Administered 2016-04-27: 2 g via INTRAVENOUS
  Filled 2016-04-27: qty 100

## 2016-04-27 MED ORDER — ACETAMINOPHEN 325 MG PO TABS: 325.0000 mg | ORAL_TABLET | ORAL | Status: DC | PRN

## 2016-04-27 MED ORDER — MIDAZOLAM HCL 5 MG/5ML IJ SOLN
INTRAMUSCULAR | Status: AC
  Filled 2016-04-27: qty 5

## 2016-04-27 MED ORDER — CEFAZOLIN SODIUM-DEXTROSE 2-4 GM/100ML-% IV SOLN
INTRAVENOUS | Status: AC
  Filled 2016-04-27: qty 100

## 2016-04-27 SURGICAL SUPPLY — 18 items
BALLN ATTAIN 80 (BALLOONS) ×2
BALLOON ATTAIN 80 (BALLOONS) ×1 IMPLANT
CABLE SURGICAL S-101-97-12 (CABLE) ×2 IMPLANT
CATH ATTAIN COM SURV 6250V-MB2 (CATHETERS) ×2 IMPLANT
CATH HEXAPOLAR DAMATO 6F (CATHETERS) ×2 IMPLANT
DEVICE CRTP PERCEPTA QUAD MRI (Pacemaker) ×2 IMPLANT
KIT ESSENTIALS PG (KITS) ×2 IMPLANT
LEAD ATTAIN PERFORMA S 4598-88 (Lead) ×2 IMPLANT
LEAD CAPSURE NOVUS 5076-52CM (Lead) ×2 IMPLANT
LEAD CAPSURE NOVUS 5076-58CM (Lead) ×2 IMPLANT
PAD DEFIB LIFELINK (PAD) ×2 IMPLANT
SHEATH CLASSIC 7F (SHEATH) ×4 IMPLANT
SHEATH CLASSIC 9.5F (SHEATH) ×2 IMPLANT
SHIELD RADPAD SCOOP 12X17 (MISCELLANEOUS) ×2 IMPLANT
SLITTER 6232ADJ (MISCELLANEOUS) ×2 IMPLANT
TRAY PACEMAKER INSERTION (PACKS) ×2 IMPLANT
WIRE ACUITY WHISPER EDS 4648 (WIRE) ×2 IMPLANT
WIRE HI TORQ VERSACORE-J 145CM (WIRE) ×2 IMPLANT

## 2016-04-27 NOTE — Interval H&P Note (Signed)
History and Physical Interval Note:  04/27/2016 10:35 AM  [name]  has presented today for surgery, with the diagnosis of syncope  The various methods of treatment have been discussed with the patient and family. After consideration of risks, benefits and other options for treatment, the patient has consented to  Procedure(s): Pacemaker Implant (N/A) as a surgical intervention .  The patient's history has been reviewed, patient examined, no change in status, stable for surgery.  I have reviewed the patient's chart and labs.  Questions were answered to the patient's satisfaction.     [name]

## 2016-04-27 NOTE — Discharge Summary (Signed)
ELECTROPHYSIOLOGY PROCEDURE DISCHARGE SUMMARY    Patient ID: [name],  MRN: [id number], DOB/AGE: 1930-12-21 82 y.o.  Admit date: 04/25/2016 Discharge date: 04/28/2016  Primary Care Physician: [name], MD Primary Cardiologist: Bronson Ing Electrophysiologist: Kathleen Likins  Primary Discharge Diagnosis:  Symptomatic complete heart block status post pacemaker implantation this admission  Secondary Discharge Diagnosis:  1.  Severe AS 2.  CAD s/p CABG 3.  HTN 4.  Hyperlipidemia 5.  Hypothyroidism  Allergies  Allergen Reactions  . Propylene Glycol Hives and Rash   Consults: 1.  Trauma 2.  CT Surgery  Procedures This Admission:  1.  Implantation of a MDT cardiac resynchronization therapy PPM on 04/27/16 by Dr [name].  The patient received a MDT model number Percepta CRTP with model number 5076 right atrial lead, 5076 right ventricular lead, and 4598 LV lead. There were no immediate post procedure complications. 2.  CXR on 04/28/16 demonstrated no pneumothorax status post device implantation.  3.  Cardiac CT scan as part of pre-TAVR work up  Brief HPI/Hospital Course:  [name] is a 82 y.o. male with a past medical history as outlined above. He has had recurrent syncope and is being evaluated for TAVR.  His syncope the day of admission was associated with complete heart block on his implantable loop recorder and he was admitted for further evaluation.  He was seen by trauma for the laceration on his head which was glued. Risks, benefits, and alternatives to PPM implantation were reviewed with the patient who wished to proceed.  The patient  underwent implantation of a MDT CRTP with details as outlined above.  He  was monitored on telemetry overnight which demonstrated sinus rhythm with ventricular pacing and PVC's.  Left chest was without hematoma or ecchymosis.  The device was interrogated and found to be functioning normally.  CXR was obtained and demonstrated no pneumothorax  status post device implantation.  Wound care, arm mobility, and restrictions were reviewed with the patient.  The patient was examined and considered stable for discharge to home.   He will have scheduled outpatient follow up for ongoing TAVR evaluation.   Hold Plavix for 5 days post pacemaker implantation   Physical Exam: Vitals:   04/27/16 1615 04/27/16 2127 04/28/16 0544 04/28/16 0834  BP: (!) 97/57 (!) 96/51 104/62 98/61  Pulse: 93 75 78 67  Resp:  18 18   Temp:  98.3 F (36.8 C) 98.4 F (36.9 C)   TempSrc:  Oral Oral   SpO2:  96% 97%   Weight:      Height:        GEN- The patient is elderly appearing, alert and oriented x 3 today.   HEENT: normocephalic, atraumatic; sclera clear, conjunctiva pink; hearing intact; oropharynx clear; neck supple, +right periorbital ecchymosis, bandage right forehead Lungs- Clear to ausculation bilaterally, normal work of breathing.  No wheezes, rales, rhonchi Heart- Regular rate and rhythm (paced) GI- soft, non-tender, non-distended, bowel sounds present  Extremities- no clubbing, cyanosis, or edema; DP/PT/radial pulses 2+ bilaterally MS- no significant deformity or atrophy Skin- warm and dry, no rash or lesion, left chest without hematoma/ecchymosis Psych- euthymic mood, full affect Neuro- strength and sensation are intact   Labs:   Lab Results  Component Value Date   WBC 7.8 04/26/2016   HGB 9.5 (L) 04/26/2016   HCT 28.7 (L) 04/26/2016   MCV 95.7 04/26/2016   PLT 156 04/26/2016    Recent Labs Lab 04/25/16 1953  04/28/16 0553  NA  --   < >  131*  K  --   < > 4.0  CL  --   < > 100*  CO2  --   < > 25  BUN  --   < > 16  CREATININE  --   < > 0.90  CALCIUM  --   < > 9.0  PROT 5.7*  --   --   BILITOT 0.9  --   --   ALKPHOS 72  --   --   ALT 54  --   --   AST 74*  --   --   GLUCOSE  --   < > 119*  < > = values in this interval not displayed.  Discharge Medications:    Medication List    TAKE these medications     allopurinol 300 MG tablet Commonly known as:  ZYLOPRIM Take 600 mg by mouth daily.   aspirin EC 81 MG tablet Take 81 mg by mouth daily.   atorvastatin 20 MG tablet Commonly known as:  LIPITOR Take 20 mg by mouth every evening.   clobetasol cream 0.05 % Commonly known as:  TEMOVATE Apply 1 application topically 2 (two) times daily.   clopidogrel 75 MG tablet Commonly known as:  PLAVIX Take 1 tablet (75 mg total) by mouth daily with breakfast.   fluticasone 50 MCG/ACT nasal spray Commonly known as:  FLONASE Place 1 spray into both nostrils 2 (two) times daily.   furosemide 20 MG tablet Commonly known as:  LASIX Take 20 mg by mouth daily.   Iron 325 (65 Fe) MG Tabs Take 1 tablet by mouth daily.   latanoprost 0.005 % ophthalmic solution Commonly known as:  XALATAN Place 1 drop into both eyes at bedtime.   levothyroxine 112 MCG tablet Commonly known as:  SYNTHROID, LEVOTHROID Take 112 mcg by mouth daily before breakfast.   metoprolol succinate 50 MG 24 hr tablet Commonly known as:  TOPROL-XL Take 1 tablet (50 mg total) by mouth daily. Take with or immediately following a meal.   multivitamin per tablet Take 1 tablet by mouth daily.   nitroGLYCERIN 0.4 MG SL tablet Commonly known as:  NITROSTAT Place 0.4 mg under the tongue every 5 (five) minutes x 3 doses as needed. For chest pain   pyridOXINE 100 MG tablet Commonly known as:  VITAMIN B-6 Take 100 mg by mouth daily.   tamsulosin 0.4 MG Caps capsule Commonly known as:  FLOMAX Take 0.4 mg by mouth daily.   valsartan 80 MG tablet Commonly known as:  DIOVAN Take 1 tablet (80 mg total) by mouth daily.       Disposition:  Discharge Instructions    Diet - low sodium heart healthy    Complete by:  As directed   Increase activity slowly    Complete by:  As directed     Follow-up Information    [facility] Follow up on 05/10/2016.   Specialty:  Cardiology Why:  at 9:30AM for wound check   Contact information: [address], [place] Dwight, NP Follow up on 06/01/2016.   Specialty:  Cardiology Why:  at [facility] information: [place] [address] [phone]        [name], MD Follow up on 07/31/2016.   Specialty:  Cardiology Why:  at [facility] information: [place] [address] [phone]  FAGAN,ROY, MD Follow up in 1 week(s).   Specialty:  Internal Medicine Why:  to have forehead lacerations looked at  Contact information: [address] [place] [address] [phone]           Duration of Discharge Encounter: Greater than 30 minutes including physician time.  Signed, Chanetta Marshall, NP 04/28/2016 9:06 AM  I have seen, examined the patient, and reviewed the above assessment and plan.  Device interrogation reviewed and normal.  CXR reveals stable leads, no ptx.  Changes to above are made where necessary.    Co Sign: [name], MD 04/28/2016 10:43 AM

## 2016-04-27 NOTE — H&P (View-Only) (Signed)
ELECTROPHYSIOLOGY CONSULT NOTE    Patient ID: [name] MRN: [id number], DOB/AGE: Jan 06, 1931 82 y.o.  Admit date: 04/25/2016 Date of Consult: 04/26/2016  Primary Physician: [name], MD Primary Cardiologist: Bronson Ing Electrophysiologist: [name] Requesting MD: Johnsie Cancel  Reason for Consultation: syncope   HPI:  [name] is a 82 y.o. male with a past medical history significant for aortic stenosis, CAD s/p CABG, HTN, hyperlipidemia, and recurrent syncope who underwent ILR implant.  He has had recurrent syncope with no arrhthymias documented on loop recorder and has been undergoing workup for TAVR.  He was walking yesterday out to his car after paying for gas when he began not feeling well.  This is typical of his symptoms of syncope in the past.  He abruptly loss consciousness and hit his head when he fell to the ground. He was taken to [facility] where he required stitches for his head laceration and transferred to [facility] for further evaluation.  Interrogation of his ILR this morning showed complete heart block at the time of syncopal spell. EP has been asked to evaluate for treatment options.  He currently denies chest pain, shortness of breath, LE edema, recent fevers, chills, nausea or vomiting. He did have a headache last night but no vision changes.   Past Medical History:  Diagnosis Date  . Aortic stenosis    Mild, echo, August, 2012  . Arthritis   . Benign prostatic hypertrophy   . CAD (coronary artery disease)    DES, SVG to circumflex marginal, October, 2010 ( SVG to PDA and PLA patent , LIMA to LAD patent, EF 60%, mild inferior hypokinesis  . Carotid artery disease ([place])    DES, SVG to circumflex marginal, October, 2010 ( SVG to PDA and PLA patent , LIMA to LAD patent, EF 60%, mild inferior hypokinesis; H/o mild CHF, CABG  . GERD (gastroesophageal reflux disease)    takes daily  . Head trauma 04/25/2016  . HTN (hypertension)    takes meds daily  . Hx of  decompressive lumbar laminectomy    Dr.Botero December, 2011  . Hyperlipidemia   . Hypothyroidism   . Nephrolithiasis   . Neuromuscular disorder ([place])   . Psoriasis    severe; with total skin exfoliation in the past resolved  . PVC's (premature ventricular contractions)    May, 2014  . Syncope    Remote, etiology unknown  . Ventricular tachycardia, non-sustained [facility])      Surgical History:  Past Surgical History:  Procedure Laterality Date  . APPENDECTOMY    . BACK SURGERY     x3  . bilateral L3-4 laminectomies    . CARDIAC CATHETERIZATION N/A 11/17/2015   Procedure: Right/Left Heart Cath and Coronary Angiography;  Surgeon: Peter M Martinique, MD;  Location: [place] CV LAB;  Service: Cardiovascular;  Laterality: N/A;  . CARDIAC CATHETERIZATION N/A 11/17/2015   Procedure: Coronary Stent Intervention;  Surgeon: Peter M Martinique, MD;  Location: [place] CV LAB;  Service: Cardiovascular;  Laterality: N/A;  . CARDIAC CATHETERIZATION N/A 04/19/2016   Procedure: Right/Left Heart Cath and Coronary/Graft Angiography;  Surgeon: [name], MD;  Location: [place] CV LAB;  Service: Cardiovascular;  Laterality: N/A;  . [facility] Remote Monitoring  Oct. 29, 2015   by Dr. [name]  . CAROTID ENDARTERECTOMY Left 02-24-08   cea  Dr. Amedeo Plenty  . CARPAL TUNNEL RELEASE Bilateral 2010  . CATARACT EXTRACTION, BILATERAL    . COLONOSCOPY  11/23/2011   Procedure: COLONOSCOPY;  Surgeon: Bernadene Person  Gloriann Loan, MD;  Location[facility];  Service: Endoscopy;  Laterality: N/A;  730  . CORONARY ARTERY BYPASS GRAFT  1995  . CORONARY STENT PLACEMENT  11/17/2015   SVG   DES to RCA  . decompression of left median nerve    . JOINT REPLACEMENT Left Sept. 27, 2013   Knee  . left carotid endarectomy and Dacron patch angioplasty]    . LEFT HEART CATHETERIZATION WITH CORONARY ANGIOGRAM N/A 12/27/2012   Procedure: LEFT HEART CATHETERIZATION WITH CORONARY ANGIOGRAM;  Surgeon: Burnell Blanks, MD;  Location:  [facility] CATH LAB;  Service: Cardiovascular;  Laterality: N/A;  . posterior arthrodesis with autograft and allograft    . removal of synovial cyst    . SPINE SURGERY     X's 3  . TEE WITHOUT CARDIOVERSION N/A 04/21/2016   Procedure: TRANSESOPHAGEAL ECHOCARDIOGRAM (TEE);  Surgeon: Pixie Casino, MD;  Location: [facility] ENDOSCOPY;  Service: Cardiovascular;  Laterality: N/A;  . TONSILLECTOMY    . TOTAL KNEE ARTHROPLASTY  04/16/2012   Procedure: TOTAL KNEE ARTHROPLASTY;  Surgeon: Garald Balding, MD;  Location: [place];  Service: Orthopedics;  Laterality: Left;     Prescriptions Prior to Admission  Medication Sig Dispense Refill Last Dose  . allopurinol (ZYLOPRIM) 300 MG tablet Take 600 mg by mouth daily.    04/25/2016 at Unknown time  . aspirin EC 81 MG tablet Take 81 mg by mouth daily.   04/25/2016 at Unknown time  . atorvastatin (LIPITOR) 20 MG tablet Take 20 mg by mouth every evening.    04/24/2016 at Unknown time  . clobetasol cream (TEMOVATE) [id number] % Apply 1 application topically 2 (two) times daily.   04/25/2016 at Unknown time  . clopidogrel (PLAVIX) 75 MG tablet Take 1 tablet (75 mg total) by mouth daily with breakfast. 90 tablet 3 04/25/2016 at Unknown time  . Ferrous Sulfate (IRON) 325 (65 Fe) MG TABS Take 1 tablet by mouth daily.   04/25/2016 at Unknown time  . fluticasone (FLONASE) 50 MCG/ACT nasal spray Place 1 spray into both nostrils 2 (two) times daily.   04/25/2016 at Unknown time  . furosemide (LASIX) 20 MG tablet Take 20 mg by mouth daily.   04/25/2016 at Unknown time  . latanoprost (XALATAN) 0.005 % ophthalmic solution Place 1 drop into both eyes at bedtime.   04/24/2016 at Unknown time  . levothyroxine (SYNTHROID, LEVOTHROID) 112 MCG tablet Take 112 mcg by mouth daily before breakfast.   04/25/2016 at Unknown time  . multivitamin (THERAGRAN) per tablet Take 1 tablet by mouth daily.    04/24/2016 at Unknown time  . nitroGLYCERIN (NITROSTAT) 0.4 MG SL tablet Place 0.4 mg under the tongue every 5 (five) minutes  x 3 doses as needed. For chest pain   unknown  . pyridOXINE (VITAMIN B-6) 100 MG tablet Take 100 mg by mouth daily.    04/25/2016 at Unknown time  . Tamsulosin HCl (FLOMAX) 0.4 MG CAPS Take 0.4 mg by mouth daily.    04/25/2016 at Unknown time  . valsartan (DIOVAN) 80 MG tablet Take 1 tablet (80 mg total) by mouth daily. 30 tablet 6 04/25/2016 at Unknown time    Inpatient Medications:  . allopurinol  600 mg Oral Daily  . aspirin EC  81 mg Oral Daily  . atorvastatin  20 mg Oral QPM  . clopidogrel  75 mg Oral Q breakfast  . ferrous sulfate   Oral Daily  . furosemide  20 mg Oral Daily  . irbesartan  75 mg Oral Daily  . latanoprost  1 drop Both Eyes QHS  . levothyroxine  112 mcg Oral QAC breakfast  . multivitamin with minerals   Oral Daily  . pyridOXINE  100 mg Oral Daily  . sodium chloride flush  3 mL Intravenous Q12H  . tamsulosin  0.4 mg Oral Daily    Allergies:  Allergies  Allergen Reactions  . Propylene Glycol Hives and Rash    Social History   Social History  . Marital status: Married    Spouse name: N/A  . Number of children: 2  . Years of education: 16   Occupational History  . Retired    Social History Main Topics  . Smoking status: Never Smoker  . Smokeless tobacco: Never Used  . Alcohol use No     Comment: One beer per week.  . Drug use: No  . Sexual activity: Not Currently   Other Topics Concern  . Not on file   Social History Narrative   Lives at home with wife.   1 cup coffee per day.   Right-handed.     Family History  Problem Relation Age of Onset  . Heart attack Mother   . Heart disease Mother     Heart Disease before age 82  . Heart disease Father     Heart Disease before age 3  . Hypertension Father   . Hyperlipidemia Father   . Heart attack Son 2  . Colon cancer Neg Hx      Review of Systems: All other systems reviewed and are otherwise negative except as noted above.  Physical Exam: Vitals:   04/25/16 1639 04/25/16 1802 04/25/16  2048 04/26/16 0417  BP: 137/74 122/62 (!) 91/44 (!) 109/48  Pulse: 98 97 86 79  Resp: 18 16 16 14   Temp: 98.4 F (36.9 C) 98.3 F (36.8 C) 98.3 F (36.8 C) 98.4 F (36.9 C)  TempSrc: Oral Oral Oral Oral  SpO2: 97% 99% 96% 97%  Weight:  175 lb 11.3 oz (79.7 kg)    Height:  5\' 8"  (1.727 m)      GEN- The patient is elderly appearing, alert and oriented x 3 today.   HEENT: normocephalic, atraumatic; sclera clear, conjunctiva pink; hearing intact; oropharynx clear; neck supple, head wrapped with large bandage  Lungs- Clear to ausculation bilaterally, normal work of breathing.  No wheezes, rales, rhonchi Heart- Regular rate and rhythm, 3/6SEM GI- soft, non-tender, non-distended, bowel sounds present Extremities- no clubbing, cyanosis, or edema; DP/PT/radial pulses 2+ bilaterally MS- no significant deformity or atrophy Skin- warm and dry, no rash or lesion Psych- euthymic mood, full affect Neuro- strength and sensation are intact  Labs:   Lab Results  Component Value Date   WBC 7.8 04/26/2016   HGB 9.5 (L) 04/26/2016   HCT 28.7 (L) 04/26/2016   MCV 95.7 04/26/2016   PLT 156 04/26/2016    Recent Labs Lab 04/25/16 1953 04/26/16 0410  NA  --  135  K  --  3.8  CL  --  95*  CO2  --  28  BUN  --  18  CREATININE  --  1.02  CALCIUM  --  8.9  PROT 5.7*  --   BILITOT 0.9  --   ALKPHOS 72  --   ALT 54  --   AST 74*  --   GLUCOSE  --  122*      Radiology/Studies: Ct Head Wo Contrast Result Date: 04/25/2016 CLINICAL DATA:  82 year old male post fall at gas station. Unsure if passed out. Laceration right eyebrow. Initial encounter. EXAM: CT HEAD WITHOUT CONTRAST CT MAXILLOFACIAL WITHOUT CONTRAST TECHNIQUE: Multidetector CT imaging of the head and maxillofacial structures were performed using the standard protocol without intravenous contrast. Multiplanar CT image reconstructions of the maxillofacial structures were also generated. COMPARISON:  12/11/2012 CT FINDINGS: CT HEAD  FINDINGS Brain: No intracranial hemorrhage. Atrophy without hydrocephalus. Small vessel disease changes without CT evidence of large acute infarct. Vascular: Vascular calcifications. Skull: No skull fracture Sinuses/Orbits: Right preseptal hematoma.  Please see below. CT MAXILLOFACIAL FINDINGS Osseous: No acute facial or orbital fracture. Prominent degenerative changes C1-2 with transverse ligament hypertrophy. Prior surgery upper cervical spine with spinal stenosis incompletely assessed. Orbits: Post lens replacement. Globes appear to be grossly intact. Prominent right preseptal hematoma. Sinuses: Bony expansion along right upper molar. Mucosal thickening adjacent inferior right maxillary sinus measuring up to 3 mm. Soft tissues: Right preseptal hematoma as noted above. Hematoma extends anterior lateral to the right temporalis region. Limited intracranial: Atrophy. IMPRESSION: CT HEAD No intracranial hemorrhage or skull fracture. Atrophy. Small vessel disease changes without CT evidence of large acute infarct. CT MAXILLOFACIAL Prominent right preseptal hematoma without underlying orbital or facial fracture. Post lens replacement.  Globes appear to be grossly intact. Post upper cervical spine surgery. Cervical spondylotic changes contribute to spinal stenosis. Degenerative changes C1-2 articulation with transverse ligament hypertrophy. Electronically Signed   By: Genia Del M.D.   On: 04/25/2016 15:07   [id number] rhythm, rate 91, first degree AV block, IVCD  TELEMETRY: sinus rhythm  Assessment/Plan: 1.  Complete heart block with syncope The patient has had syncope now with documented complete heart block.  He is on no AVN blocking agents and will need pacemaker implantation. Risks, benefits reviewed with the patient today who wishes to proceed. Will plan for tomorrow afternoon with Dr [name].    2.  Aortic stenosis Plan for CT today to further evaluate Further TAVR workup per Dr Cooper/Bartle  3.   HTN Stable No change required today Avoid AVN blocking agents    Signed, Chanetta Marshall, NP 04/26/2016 7:36 AM  I have seen, examined the patient, and reviewed the above assessment and plan.  On exam, RRR.  Changes to above are made where necessary.  The patient has symptomatic mobitz II second degree AV block documented during his most recent syncopal event. No reversible causes are found.  I would therefore recommend pacemaker implantation at this time.  Given EF 45% and anticipation of >40% RV pacing, I would advise resynchronization therapy also.  Risks, benefits, alternatives to biventricular pacemaker implantation with implantable loop recorder removal were discussed in detail with the patient today. The patient understands that the risks include but are not limited to bleeding, infection, pneumothorax, perforation, tamponade, vascular damage, renal failure, MI, stroke, death,  and lead dislodgement and wishes to proceed. He is left handed but given anticipated anatomy, agrees with L sided device implantation.   Co Sign: [name], MD 04/27/2016 10:32 AM

## 2016-04-27 NOTE — Progress Notes (Signed)
PT Cancellation Note  Patient Details Name: [name] MRN: [id number] DOB: July 29, 1931   Cancelled Treatment:    Reason Eval/Treat Not Completed: Patient at procedure or test/unavailable. Pt in OR for pacemaker implantation.   Lorriane Shire 04/27/2016, 10:59 AM

## 2016-04-28 ENCOUNTER — Other Ambulatory Visit: Payer: Self-pay | Admitting: *Deleted

## 2016-04-28 ENCOUNTER — Inpatient Hospital Stay (HOSPITAL_COMMUNITY): Payer: Medicare HMO

## 2016-04-28 ENCOUNTER — Encounter (HOSPITAL_COMMUNITY): Payer: Self-pay | Admitting: Internal Medicine

## 2016-04-28 DIAGNOSIS — I2510 Atherosclerotic heart disease of native coronary artery without angina pectoris: Secondary | ICD-10-CM

## 2016-04-28 DIAGNOSIS — I350 Nonrheumatic aortic (valve) stenosis: Secondary | ICD-10-CM

## 2016-04-28 DIAGNOSIS — I10 Essential (primary) hypertension: Secondary | ICD-10-CM

## 2016-04-28 LAB — BASIC METABOLIC PANEL
ANION GAP: 6 (ref 5–15)
BUN: 16 mg/dL (ref 6–20)
CALCIUM: 9 mg/dL (ref 8.9–10.3)
CO2: 25 mmol/L (ref 22–32)
Chloride: 100 mmol/L — ABNORMAL LOW (ref 101–111)
Creatinine, Ser: 0.9 mg/dL (ref 0.61–1.24)
Glucose, Bld: 119 mg/dL — ABNORMAL HIGH (ref 65–99)
Potassium: 4 mmol/L (ref 3.5–5.1)
Sodium: 131 mmol/L — ABNORMAL LOW (ref 135–145)

## 2016-04-28 MED ORDER — FUROSEMIDE 20 MG PO TABS
20.0000 mg | ORAL_TABLET | Freq: Once | ORAL | Status: AC
  Administered 2016-04-28: 20 mg via ORAL
  Filled 2016-04-28: qty 1

## 2016-04-28 MED ORDER — METOPROLOL SUCCINATE ER 50 MG PO TB24: 50.0000 mg | ORAL_TABLET | Freq: Every day | ORAL | 1 refills | Status: DC

## 2016-04-28 NOTE — Care Management Note (Signed)
Case Management Note Marvetta Gibbons RN, BSN Unit 2W-Case Manager [phone]  Patient Details  Name: NURI BILLEY MRN: [id number] Date of Birth: 1930/12/25  Subjective/Objective:   Pt admitted with syncope and found to have CHB on loop recorder - plan for PPM on 9/7                 Action/Plan: PTA pt lived at home with wife- anticipate return home- CM to follow post procedure for d/c needs  Expected Discharge Date:    04/28/16              Expected Discharge Plan:  Home/Self Care  In-House Referral:     Discharge planning Services  CM Consult  Post Acute Care Choice:    Choice offered to:     DME Arranged:    DME Agency:     HH Arranged:    [place] Agency:     Status of Service:  Completed, signed off  If discussed at [place] of Stay Meetings, dates discussed:    Discharge Disposition: Home self care   Additional Comments:  Dawayne Patricia, RN 04/28/2016, 9:48 AM

## 2016-04-28 NOTE — Discharge Instructions (Signed)
° ° °  Supplemental Discharge Instructions for  Pacemaker/Defibrillator Patients  Activity No heavy lifting or vigorous activity with your left/right arm for 6 to 8 weeks.  Do not raise your left/right arm above your head for one week.  Gradually raise your affected arm as drawn below.           __        05/02/16                        05/03/16                   05/04/16                    05/05/16   WOUND CARE - Keep the wound area clean and dry.  Do not get this area wet for one week. No showers for one week; you may shower on 05/05/16    . - The tape/steri-strips on your wound will fall off; do not pull them off.  No bandage is needed on the site.  DO  NOT apply any creams, oils, or ointments to the wound area. - If you notice any drainage or discharge from the wound, any swelling or bruising at the site, or you develop a fever > 101? F after you are discharged home, call the office at once.  Special Instructions - You are still able to use cellular telephones; use the ear opposite the side where you have your pacemaker/defibrillator.  Avoid carrying your cellular phone near your device. - When traveling through airports, show security personnel your identification card to avoid being screened in the metal detectors.  Ask the security personnel to use the hand wand. - Avoid arc welding equipment, TENS units (transcutaneous nerve stimulators).  Call the office for questions about other devices. - Avoid electrical appliances that are in poor condition or are not properly grounded. - Microwave ovens are safe to be near or to operate.

## 2016-04-28 NOTE — Consult Note (Signed)
[facility]    CARDIOTHORACIC SURGERY CONSULTATION REPORT  Referring Provider is [name], MD PCP is [name], MD  Reason for consultation: Severe, symptomatic aortic stenosis  HPI:  The patient is an 82 year old gentleman with hypertension, hyperlipidemia, hypothyroidism, cerebrovascular disease s/p left carotid endarterectomy in 2009 and known coronary disease s/p CABG by me in 1995 and subsequent SVG PCI/stenting in 2010 and 10/2015. He has had known moderate aortic stenosis that has been followed and over the past year has developed exertional shortness of breath and fatigue. He has also had recurrent episodes of exertional dizziness and syncope. He says that he knows when he is going to have one of those episodes because he gets weak and feels like things are closing in one him. He has had a loop recorder with no documented arrhthymias. He has usually been able to sit down prior to passing out. He says that he probably had 5 syncopal episodes over the past couple years and his symptoms were not improved after his PCI in 10/2015.He had a repeat 2D echo on 03/29/2016 showing moderately thickened and calcified aortic valve leaflets with severely reduced leaflet separation. The mean gradient was 21 mm Hg with a peak of 36 mm Hg. The peak velocity ratio was 0.27. The LVEF was 50%. He had an ETT on 8/23 and reached 85% of his max heart rate with no symptoms. He stopped due to fatigue. He had a cardiac cath on 04/19/2016 showing severe native vessel disease with occlusion of the LM and RCA with continued patency of the LIMA to LAD, SVG to OM and SVG to the PDA with moderate in-stent restenosis in the PDA graft. Cardiac output was 3.93 with an index of 2.04 with a mean AV gradient of 24.5 mm Hg and an AVA of 0.82. He had what was felt to be an atrial tachycardia throughout the procedure to 110-120 bpm. Dr. Burt Knack attempted to give him dobutamine to evaluate his gradient but this was not  tolerated due to his tachycardia. He had a TEE on 9/1 showing a mean AV gradient of 29 mm Hg with an AVA by planimetry of 0.88 cm2. There was an LVEF of 45-50% with mild MR. The valve looked like a severely stenotic valve. He was set up to see me in the office for consideration of TAVR but was admitted to [facility] on 9/5 after a syncopal episode while pumping gas. He says that he felt bad all of the sudden and passed out hitting the concrete. He does not remember anything until he woke up. He sustained a bad contusion and laceration to the right forehead and abrasions to his extremities. He was transferred to [facility] for further evaluation and interrogation of his loop recorder showed complete heart block at the time of his syncope. He had a pacer implanted by EP yesterday.  The patient lives at home with his wife and is very active and independent. He feels like he has been limited recently due to fatigue and SOB with exertion and recurrent episodes of dizziness. He has had no chest pain or pressure.    Past Medical History:  Diagnosis Date  . Aortic stenosis    Mild, echo, August, 2012  . Arthritis   . Benign prostatic hypertrophy   . CAD (coronary artery disease)    DES, SVG to circumflex marginal, October, 2010 ( SVG to PDA and PLA patent , LIMA to LAD patent, EF 60%, mild inferior hypokinesis  .  Carotid artery disease ([place])    DES, SVG to circumflex marginal, October, 2010 ( SVG to PDA and PLA patent , LIMA to LAD patent, EF 60%, mild inferior hypokinesis; H/o mild CHF, CABG  . GERD (gastroesophageal reflux disease)    takes daily  . Head trauma 04/25/2016  . HTN (hypertension)    takes meds daily  . Hx of decompressive lumbar laminectomy    Dr.Botero December, 2011  . Hyperlipidemia   . Hypothyroidism   . Nephrolithiasis   . Neuromuscular disorder ([place])   . Psoriasis    severe; with total skin exfoliation in the past resolved  . PVC's (premature ventricular contractions)    May, 2014  .  Syncope    Remote, etiology unknown  . Ventricular tachycardia, non-sustained [facility])     Past Surgical History:  Procedure Laterality Date  . APPENDECTOMY    . BACK SURGERY     x3  . bilateral L3-4 laminectomies    . CARDIAC CATHETERIZATION N/A 11/17/2015   Procedure: Right/Left Heart Cath and Coronary Angiography;  Surgeon: Peter M Martinique, MD;  Location: [place] [facility];  Service: Cardiovascular;  Laterality: N/A;  . CARDIAC CATHETERIZATION N/A 11/17/2015   Procedure: Coronary Stent Intervention;  Surgeon: Peter M Martinique, MD;  Location: [place] [facility];  Service: Cardiovascular;  Laterality: N/A;  . CARDIAC CATHETERIZATION N/A 04/19/2016   Procedure: Right/Left Heart Cath and Coronary/Graft Angiography;  Surgeon: [name], MD;  Location: [place] [facility];  Service: Cardiovascular;  Laterality: N/A;  . [employer] Remote Monitoring  Oct. 29, 2015   by Dr. [name]  . CAROTID ENDARTERECTOMY Left 02-24-08   cea  Dr. Amedeo Plenty  . CARPAL TUNNEL RELEASE Bilateral 2010  . CATARACT EXTRACTION, BILATERAL    . COLONOSCOPY  11/23/2011   Procedure: COLONOSCOPY;  Surgeon: Rogene Houston, MD;  Location: [facility];  Service: Endoscopy;  Laterality: N/A;  730  . CORONARY ARTERY BYPASS GRAFT  1995  . CORONARY STENT PLACEMENT  11/17/2015   SVG   DES to RCA  . decompression of left median nerve    . EP IMPLANTABLE DEVICE N/A 04/27/2016   Procedure: Pacemaker Implant;  Surgeon: [name], MD;  Location: [place] [facility];  Service: Cardiovascular;  Laterality: N/A;  . JOINT REPLACEMENT Left Sept. 27, 2013   Knee  . left carotid endarectomy and Dacron patch angioplasty]    . LEFT HEART CATHETERIZATION WITH CORONARY ANGIOGRAM N/A 12/27/2012   Procedure: LEFT HEART CATHETERIZATION WITH CORONARY ANGIOGRAM;  Surgeon: Burnell Blanks, MD;  Location: [facility] C[facility] LAB;  Service: Cardiovascular;  Laterality: N/A;  . posterior arthrodesis with autograft and allograft    . removal of synovial cyst     . SPINE SURGERY     X's 3  . TEE WITHOUT CARDIOVERSION N/A 04/21/2016   Procedure: TRANSESOPHAGEAL ECHOCARDIOGRAM (TEE);  Surgeon: Pixie Casino, MD;  Location: [facility] ENDOSCOPY;  Service: Cardiovascular;  Laterality: N/A;  . TONSILLECTOMY    . TOTAL KNEE ARTHROPLASTY  04/16/2012   Procedure: TOTAL KNEE ARTHROPLASTY;  Surgeon: Garald Balding, MD;  Location: [place];  Service: Orthopedics;  Laterality: Left;    Family History  Problem Relation Age of Onset  . Heart attack Mother   . Heart disease Mother     Heart Disease before age 97  . Heart disease Father     Heart Disease before age 5  . Hypertension Father   . Hyperlipidemia Father   . Heart attack  Son 23  . Colon cancer Neg Hx   Father had aortic stenosis and aortic valve replacement.  Social History   Social History  . Marital status: Married    Spouse name: N/A  . Number of children: 2  . Years of education: 16   Occupational History  . Retired    Social History Main Topics  . Smoking status: Never Smoker  . Smokeless tobacco: Never Used  . Alcohol use No     Comment: One beer per week.  . Drug use: No  . Sexual activity: Not Currently   Other Topics Concern  . Not on file   Social History Narrative   Lives at home with wife.   1 cup coffee per day.   Right-handed.    Current Facility-Administered Medications  Medication Dose Route Frequency Provider Last Rate Last Dose  . 0.9 %  sodium chloride infusion  250 mL Intravenous PRN [name], MD      . acetaminophen (TYLENOL) tablet 325-650 mg  325-650 mg Oral Q4H PRN [name], MD      . allopurinol (ZYLOPRIM) tablet 600 mg  600 mg Oral Daily Isaiah Serge, NP   600 mg at 04/28/16 0836  . aspirin EC tablet 81 mg  81 mg Oral Daily Isaiah Serge, NP   81 mg at 04/28/16 0836  . atorvastatin (LIPITOR) tablet 20 mg  20 mg Oral QPM Isaiah Serge, NP   20 mg at 04/27/16 1817  . ferrous sulfate tablet   Oral Daily Isaiah Serge, NP   325 mg at 04/28/16  [id number]  . HYDROcodone-acetaminophen (NORCO/VICODIN) 5-325 MG per tablet 1-2 tablet  1-2 tablet Oral Q4H PRN [name], MD      . irbesartan (AVAPRO) tablet 75 mg  75 mg Oral Daily Isaiah Serge, NP   75 mg at 04/27/16 1620  . latanoprost (XALATAN) 0.005 % ophthalmic solution 1 drop  1 drop Both Eyes QHS Isaiah Serge, NP   1 drop at 04/27/16 2131  . levothyroxine (SYNTHROID, LEVOTHROID) tablet 112 mcg  112 mcg Oral QAC breakfast Isaiah Serge, NP   112 mcg at 04/28/16 [id number]  . metoprolol succinate (TOPROL-XL) 24 hr tablet 50 mg  50 mg Oral Daily [name], MD   50 mg at 04/27/16 1621  . multivitamin with minerals tablet   Oral Daily Isaiah Serge, NP   1 tablet at 04/28/16 [phone]  . nitroGLYCERIN (NITROSTAT) SL tablet 0.4 mg  0.4 mg Sublingual Q5 Min x 3 PRN Isaiah Serge, NP      . ondansetron [facility]) injection 4 mg  4 mg Intravenous Q6H PRN [name], MD      . psyllium (HYDROCIL/METAMUCIL) packet 1 packet  1 packet Oral Daily Patsey Berthold, NP   1 packet at 04/28/16 [phone]  . pyridOXINE (VITAMIN B-6) tablet 100 mg  100 mg Oral Daily Isaiah Serge, NP   100 mg at 04/28/16 0834  . sodium chloride flush (NS) 0.9 % injection 3 mL  3 mL Intravenous Q12H [name], MD   3 mL at 04/28/16 0840  . sodium chloride flush (NS) 0.9 % injection 3 mL  3 mL Intravenous PRN [name], MD      . tamsulosin (FLOMAX) capsule 0.4 mg  0.4 mg Oral Daily Isaiah Serge, NP   0.4 mg at 04/28/16 [id number]    Allergies  Allergen Reactions  . Propylene Glycol Hives and Rash  Review of Systems:   General:  normal appetite, decreased energy, intermittent weight gain due to fluid, no weight loss, no fever  Cardiac:  no chest pain with exertion, no chest pain at rest, moderate SOB with mild exertion, no resting SOB, no PND, no orthopnea, no palpitations, no arrhythmia, no atrial fibrillation, has LE edema, occasional dizzy spells, recent syncope  Respiratory:  exertional shortness of breath, no home  oxygen, no productive cough, no dry cough, no bronchitis, no wheezing, no hemoptysis, no asthma, no pain with inspiration or cough, no sleep apnea, no CPAP at night  GI:   no difficulty swallowing, no reflux, no frequent heartburn, no hiatal hernia, no abdominal pain, no constipation, no diarrhea, no hematochezia, no hematemesis, no melena  GU:   no dysuria,  no frequency, no urinary tract infection, no hematuria, no enlarged prostate, no kidney stones, no kidney disease  Vascular:  no pain suggestive of claudication, no pain in feet, no leg cramps, no varicose veins, no DVT, no non-healing foot ulcer  Neuro:   no stroke, no TIA's, no seizures, no headaches, no temporary blindness one eye,  no slurred speech, has peripheral neuropathy in lower legs, no chronic pain, no instability of gait, no memory/cognitive dysfunction  Musculoskeletal: no arthritis, no joint swelling, no myalgias, no difficulty walking, no mobility   Skin:   occasional rash, no itching, no skin infections, no pressure sores or ulcerations  Psych:   no anxiety, no depression, no nervousness, no unusual recent stress  Eyes:   no blurry vision, no floaters, no recent vision changes,  wears glasses or contacts  ENT:   no hearing loss, no loose or painful teeth, no dentures, last saw dentist few months ago. Sees him every 6 months. No recent problems  Hematologic:  has easy bruising, no abnormal bleeding, no clotting disorder, no frequent epistaxis  Endocrine:  no diabetes, does not check CBG's at home           Physical Exam:   BP 98/61   Pulse 67   Temp 98.4 F (36.9 C) (Oral)   Resp 18   Ht 5\' 8"  (1.727 m)   Wt 79.7 kg (175 lb 11.3 oz)   SpO2 97%   BMI 26.72 kg/m   General:  Elderly but  well-appearing, contusion and ecchymosis around right eye and forehead  HEENT:  Unremarkable , PERLA, EOMI, oropharynx clear. Teeth in fair conditon  Neck:   no JVD, no adenopathy, transmitted murmur to both sides of  neck  Chest:   clear to auscultation, symmetrical breath sounds, no wheezes, no rhonchi   CV:   RRR, grade III/VI crescendo/decrescendo murmur heard best at RSB,  no diastolic murmur  Abdomen:  soft, non-tender, no masses or organomegaly  Extremities:  warm, well-perfused, pulses palpable dp bilat, mild LE edema, right leg SV harvest scars  Rectal/GU  Deferred  Neuro:   Grossly non-focal and symmetrical throughout  Skin:   Clean and dry, no rashes, no breakdown, abrasions on all extremities from recent fall   Diagnostic Tests:       *[facility]*                   [address], [place]                           [place], [place] [address]  [phone]  ------------------------------------------------------------------- Transthoracic Echocardiography  Patient:    [name], [name] MR #:       [id number] Study Date: 03/29/2016 Gender:     M Age:        44 Height:     172.7 cm Weight:     81.6 kg BSA:        2 m^2 Pt. Status: Room:   SONOGRAPHER  [facility]  ATTENDING    Kate Sable, MD  [place], MD  [place], MD  PERFORMING   [facility]  cc:  ------------------------------------------------------------------- LV EF: 50% -   55%  ------------------------------------------------------------------- History:   PMH:   Tachycardia and syncope.  Coronary artery disease.  Aortic valve disease.  Risk factors:  Hypertension. Dyslipidemia.  ------------------------------------------------------------------- Study Conclusions  - Left ventricle: The cavity size was normal. Wall thickness was   increased in a pattern of mild LVH. Systolic function was low   normal. The estimated ejection fraction was 50%. Features are   consistent with a pseudonormal left ventricular filling pattern,   with concomitant abnormal relaxation and increased filling   pressure (grade 2 diastolic dysfunction). Doppler  parameters are   consistent with high ventricular filling pressure. - Regional wall motion abnormality: Hypokinesis of the mid inferior   and mid inferolateral myocardium. - Aortic valve: Moderately thickened, moderately calcified   leaflets. Cusp separation was severely reduced. There was   moderate stenosis. There was mild regurgitation. Peak velocity   (S): 302 cm/s. Mean gradient (S): 21 mm Hg. Valve area (VTI):   1.47 cm^2. Valve area (Vmax): 1.23 cm^2. Valve area (Vmean): 1.22   cm^2. - Mitral valve: Calcified annulus. There was moderate   regurgitation. - Left atrium: The atrium was moderately dilated. - Right ventricle: The cavity size was mildly dilated. Systolic   function was mildly reduced. - Tricuspid valve: Mildly thickened leaflets. There was mild   regurgitation.  ------------------------------------------------------------------- Labs, prior tests, procedures, and surgery: Echocardiography (March 2017).     EF was 50%. Aortic valve: peak gradient of 40 mm Hg.  Coronary artery bypass grafting.  ------------------------------------------------------------------- Study data:  Comparison was made to the study of March 2017.  Study status:  Routine.  Procedure:  The patient reported no pain pre or post test. Transthoracic echocardiography. Image quality was adequate.  Study completion:  There were no complications. Transthoracic echocardiography.  M-mode, complete 2D, spectral Doppler, and color Doppler.  Birthdate:  Patient birthdate: 1930/11/08.  Age:  Patient is 82 yr old.  Sex:  Gender: male. BMI: 27.4 kg/m^2.  Blood pressure:     126/67  Patient status: Outpatient.  Study date:  Study date: 03/29/2016. Study time: 08:57 AM.  -------------------------------------------------------------------  ------------------------------------------------------------------- Left ventricle:  The cavity size was normal. Wall thickness was increased in a pattern of mild  LVH. Systolic function was low normal. The estimated ejection fraction was 50%.  Regional wall motion abnormalities:  Hypokinesis of the mid inferior and mid inferolateral myocardium. Features are consistent with a pseudonormal left ventricular filling pattern, with concomitant abnormal relaxation and increased filling pressure (grade 2 diastolic dysfunction). Doppler parameters are consistent with high ventricular filling pressure.  ------------------------------------------------------------------- Aortic valve:   Moderately thickened, moderately calcified leaflets. Cusp separation was severely reduced.  Doppler:   There was moderate stenosis.   There was mild regurgitation.    VTI ratio of LVOT to aortic valve: 0.33. Valve area (VTI): 1.47 cm^2. Indexed valve area (VTI):  0.74 cm^2/m^2. Peak velocity ratio of LVOT to aortic valve: 0.27. Valve area (Vmax): 1.23 cm^2. Indexed valve area (Vmax): 0.62 cm^2/m^2. Mean velocity ratio of LVOT to aortic valve: 0.27. Valve area (Vmean): 1.22 cm^2. Indexed valve area (Vmean): 0.61 cm^2/m^2.    Mean gradient (S): 21 mm Hg. Peak gradient (S): 36 mm Hg.  ------------------------------------------------------------------- Aorta:  Aortic root: The aortic root was normal in size. Ascending aorta: The ascending aorta was normal in size.  ------------------------------------------------------------------- Mitral valve:   Calcified annulus.  Doppler:  There was moderate regurgitation.    Peak gradient (D): 9 mm Hg.  ------------------------------------------------------------------- Left atrium:  The atrium was moderately dilated.  ------------------------------------------------------------------- Atrial septum:  No defect or patent foramen ovale was identified.   ------------------------------------------------------------------- Right ventricle:  The cavity size was mildly dilated. Systolic function was mildly  reduced.  ------------------------------------------------------------------- Pulmonic valve:    The valve appears to be grossly normal. Doppler:  There was trivial regurgitation.  ------------------------------------------------------------------- Tricuspid valve:   Mildly thickened leaflets.  Doppler:  There was mild regurgitation.  ------------------------------------------------------------------- Right atrium:  The atrium was normal in size.  ------------------------------------------------------------------- Pericardium:  There was no pericardial effusion.  ------------------------------------------------------------------- Systemic veins: Inferior vena cava: The vessel was normal in size. The respirophasic diameter changes were in the normal range (>= 50%), consistent with normal central venous pressure.  ------------------------------------------------------------------- Measurements   Left ventricle                            Value          Reference  LV ID, ED, PLAX chordal           (H)     53.6  mm       43 - 52  LV ID, ES, PLAX chordal           (H)     46.7  mm       23 - 38  LV fx shortening, PLAX chordal    (L)     13    %        >=29  LV PW thickness, ED                       10.5  mm       ---------  IVS/LV PW ratio, ED                       0.99           <=1.3  Stroke volume, 2D                         99    ml       ---------  Stroke volume/bsa, 2D                     50    ml/m^2   ---------  LV ejection fraction, 1-p A4C             50    %        ---------  LV end-diastolic volume, 2-p              115   ml       ---------  LV end-systolic volume, 2-p               59    ml       ---------  LV ejection fraction, 2-p                 49    %        ---------  Stroke volume, 2-p                        56    ml       ---------  LV end-diastolic volume/bsa, 2-p          58    ml/m^2   ---------  LV end-systolic volume/bsa, 2-p           29    ml/m^2    ---------  Stroke volume/bsa, 2-p                    28.2  ml/m^2   ---------  LV e&', lateral                            8.61  cm/s     ---------  LV E/e&', lateral                          17.19          ---------  LV e&', medial                             6.58  cm/s     ---------  LV E/e&', medial                           22.49          ---------  LV e&', average                            7.6   cm/s     ---------  LV E/e&', average                          19.49          ---------    Ventricular septum                        Value          Reference  IVS thickness, ED                         10.4  mm       ---------    LVOT                                      Value          Reference  LVOT ID, S                                24    mm       ---------  LVOT area                                 4.52  cm^2     ---------  LVOT peak velocity, S                     82.1  cm/s     ---------  LVOT mean velocity, S                     57.8  cm/s     ---------  LVOT VTI, S                               21.9  cm       ---------    Aortic valve                              Value          Reference  Aortic valve peak velocity, S             302   cm/s     ---------  Aortic valve mean velocity, S             215   cm/s     ---------  Aortic valve VTI, S                       67.2  cm       ---------  Aortic mean gradient, S                   21    mm Hg    ---------  Aortic peak gradient, S                   36    mm Hg    ---------  VTI ratio, LVOT/AV                        0.33           ---------  Aortic valve area, VTI                    1.47  cm^2     ---------  Aortic valve area/bsa, VTI                0.74  cm^2/m^2 ---------  Velocity ratio, peak, LVOT/AV             0.27           ---------  Aortic valve area, peak velocity          1.23  cm^2     ---------  Aortic valve area/bsa, peak               0.62  cm^2/m^2 ---------  velocity  Velocity ratio, mean, LVOT/AV             0.27            ---------  Aortic valve area, mean velocity          1.22  cm^2     ---------  Aortic valve area/bsa, mean               0.61  cm^2/m^2 ---------  velocity  Aortic regurg pressure half-time          770   ms       ---------    Aorta  Value          Reference  Aortic root ID, ED                        32    mm       ---------    Left atrium                               Value          Reference  LA ID, A-P, ES                            43    mm       ---------  LA ID/bsa, A-P                            2.15  cm/m^2   <=2.2  LA volume, S                              65.7  ml       ---------  LA volume/bsa, S                          32.9  ml/m^2   ---------  LA volume, ES, 1-p A4C                    73.3  ml       ---------  LA volume/bsa, ES, 1-p A4C                36.7  ml/m^2   ---------  LA volume, ES, 1-p A2C                    58.6  ml       ---------  LA volume/bsa, ES, 1-p A2C                29.4  ml/m^2   ---------    Mitral valve                              Value          Reference  Mitral E-wave peak velocity               148   cm/s     ---------  Mitral A-wave peak velocity               107   cm/s     ---------  Mitral deceleration time                  156   ms       150 - 230  Mitral peak gradient, D                   9     mm Hg    ---------  Mitral E/A ratio, peak                    1.4            ---------  Mitral regurg VTI, PISA  169   cm       ---------  Mitral ERO, PISA                          0.07  cm^2     ---------  Mitral regurg volume, PISA                12    ml       ---------    Pulmonary arteries                        Value          Reference  PA pressure, S, DP                        22    mm Hg    <=30    Tricuspid valve                           Value          Reference  Tricuspid regurg peak velocity            220   cm/s     ---------  Tricuspid peak RV-RA gradient             19     mm Hg    ---------    Systemic veins                            Value          Reference  Estimated CVP                             3     mm Hg    ---------    Right ventricle                           Value          Reference  RV pressure, S, DP                        22    mm Hg    <=30    Pulmonic valve                            Value          Reference  Pulmonic regurg velocity, ED              138   cm/s     ---------  Pulmonic regurg gradient, ED              8     mm Hg    ---------  Legend: (L)  and  (H)  mark values outside specified reference range.  ------------------------------------------------------------------- Prepared and Electronically Authenticated by  Kate Sable, MD 2017-08-09T11:09:21                  *[facility]*  [address], [place] [address]                            [phone]  ------------------------------------------------------------------- Transesophageal Echocardiography  Patient:    [name], [name] MR #:       [id number] Study Date: 04/21/2016 Gender:     M Age:        20 Height:     172.7 cm Weight:     79.4 kg BSA:        1.97 m^2 Pt. Status: Room:   ORDERING     [name], MD  REFERRING    [name], MD  [place] MD  [place] MD  PERFORMING   Lyman Bishop MD  SONOGRAPHER  Madelin Rear, RDCS  cc:  ------------------------------------------------------------------- LV EF: 45% -   50%  ------------------------------------------------------------------- Indications:      Aortic stenosis 424.1.  ------------------------------------------------------------------- History:   PMH:   Syncope.  Coronary artery disease.  Aortic valve disease.  Risk factors:  Hypertension.  ------------------------------------------------------------------- Study  Conclusions  - Left ventricle: Mild LVH. Systolic function was mildly reduced.   The estimated ejection fraction was in the range of 45% to 50%.   Inferior and inferolateral hypokinesis. - Aortic valve: Sclerotic, rheumatic-appearing leaflets with   severely reduced excursion. The base of the leaflets are   calcified. There is severe stenosis. AVA is around 0.8-0.9 cm2.   Peak and mean gradients of 29 and 47 mmHg. AVA by planimetry was   0.88 cm2. There was heavy aliasing of color doppler in the   ascending aorta suggestive of severe aortic stenosis and high   velocity jet. There is trivial aortic insufficiency. Mean   gradient (S): 29 mm Hg. Peak gradient (S): 47 mm Hg. - Aorta: Grade 2 atheroma of the aortic arch. - Mitral valve: Mildly thickened leaflets . There was mild   regurgitation. - Left atrium: The atrium was dilated. No evidence of thrombus in   the atrial cavity or appendage. - Pulmonary veins: No anomaly. - Atrial septum: No defect or patent foramen ovale was identified. - Pulmonic valve: No evidence of vegetation.  Impressions:  - Severe aortic stenosis - rheumatic appearing valve. AVA around   0.8-0.9 cm2. Trivial AI. Mild mitral regurgitation. LVEF 45-50%   with inferior and inferolateral hypokinesis.  ------------------------------------------------------------------- Study data:   Study status:  Routine.  Consent:  The risks, benefits, and alternatives to the procedure were explained to the patient and informed consent was obtained.  Procedure:  The patient reported no pain pre or post test. Initial setup. The patient was brought to the laboratory. Surface ECG leads were monitored. Sedation. Deep sedation was administered by anesthesiology staff. Sedation was reversed at the end of the procedure. Transesophageal echocardiography. An adult multiplane transesophageal probe was inserted by the attending cardiologistwithout difficulty. Image quality was  adequate. Intravenous contrast (agitated saline) was administered.  Study completion:  The patient tolerated the procedure well. There were no complications.  Administered medications:   Propofol, 60mg , IV.          Diagnostic transesophageal echocardiography.  2D and color Doppler. Birthdate:  Patient birthdate: Feb 15, 1931.  Age:  Patient is 82 yr old.  Sex:  Gender: male.    BMI: 26.6 kg/m^2.  Blood pressure: 97/69  Patient status:  Inpatient.  Study date:  Study date: 04/21/2016. Study time: 12:00 PM.  Location:  Endoscopy.  -------------------------------------------------------------------  ------------------------------------------------------------------- Left ventricle:  Mild LVH. Systolic function was mildly reduced. The estimated ejection fraction was in the range of 45% to 50%. Inferior and inferolateral hypokinesis.  ------------------------------------------------------------------- Aortic valve:  Sclerotic, rheumatic-appearing leaflets with severely reduced excursion. The base of the leaflets are calcified. There is severe stenosis. AVA is around 0.8-0.9 cm2. Peak and mean gradients of 29 and 47 mmHg. AVA by planimetry was 0.88 cm2. There was heavy aliasing of color doppler in the ascending aorta suggestive of severe aortic stenosis and high velocity jet. There is trivial aortic insufficiency.  Doppler:     Mean gradient (S): 29 mm Hg. Peak gradient (S): 47 mm Hg.  ------------------------------------------------------------------- Aorta:  Grade 2 atheroma of the aortic arch.  ------------------------------------------------------------------- Mitral valve:   Mildly thickened leaflets .  Doppler:  There was mild regurgitation.  ------------------------------------------------------------------- Left atrium:  The atrium was dilated.  No evidence of thrombus in the atrial cavity or  appendage.  ------------------------------------------------------------------- Atrial septum:  No defect or patent foramen ovale was identified.   ------------------------------------------------------------------- Pulmonary veins:  No anomaly.  ------------------------------------------------------------------- Right ventricle:  The cavity size was normal. Wall thickness was normal. Systolic function was normal.  ------------------------------------------------------------------- Pulmonic valve:    Structurally normal valve.   Cusp separation was normal.  No evidence of vegetation.  Doppler:  There was trivial regurgitation.  ------------------------------------------------------------------- Tricuspid valve:   Doppler:  There was trivial regurgitation.  ------------------------------------------------------------------- Pulmonary artery:   The main pulmonary artery was normal-sized.  ------------------------------------------------------------------- Right atrium:  The atrium was normal in size.  ------------------------------------------------------------------- Pericardium:  There was no pericardial effusion.   ------------------------------------------------------------------- Post procedure conclusions Ascending Aorta:  - Grade 2 atheroma of the aortic arch.  ------------------------------------------------------------------- Measurements   LVOT                               Value  LVOT ID, S                         20    mm  LVOT area                          3.14  cm^2    Aortic valve                       Value  Aortic valve peak velocity, S      343   cm/s  Aortic valve mean velocity, S      258   cm/s  Aortic valve VTI, S                83.7  cm  Aortic mean gradient, S            29    mm Hg  Aortic peak gradient, S            47    mm Hg  Legend: (L)  and  (H)  mark values outside specified reference  range.  ------------------------------------------------------------------- Prepared and Electronically Authenticated by  Lyman Bishop MD 2017-09-01T13:20:39  [name], MD (Primary)    Procedures   Right/Left Heart Cath and Coronary/Graft Angiography  Conclusion   1. Severe native vessel coronary artery disease with total occlusion  of the left main coronary artery and right coronary artery 2. Status post CABG with continued patency of the LIMA to LAD, saphenous vein graft to OM, and saphenous vein graft to PDA with moderate in-stent restenosis in the PDA graft 3. Moderate to severe aortic stenosis with aortic valve calculations as outlined below 4. Large V waves raising suspicion for significant mitral regurgitation 5. Patient with narrow complex tachycardia heart rate approximately 110 120 bpm throughout the procedure, complicating hemodynamic assessment  Complicated case and patient who has coronary artery disease with moderate in-stent restenosis of the saphenous vein graft RCA, probable severe aortic stenosis, and conduction disease with frequent PVCs and possibly atrial tachycardia during his catheterization. Will review his case with the multidisciplinary heart team including electrophysiology. I think it TEE would add important information about his valvular heart disease. He may have significant mitral regurgitation in addition to his aortic stenosis. After TEE would refer him to cardiac surgery for evaluation. If he has functional mitral regurgitation and his arrhythmia does not require further treatment, he may benefit from TAVR.  Indications   Severe aortic stenosis [I35.0 (ICD-10-CM)]  Procedural Details/Technique   Technical Details INDICATION: Aortic stenosis, possible severe low gradient aortic stenosis. Known CAD with hx CABG and PCI of saphenous vein graft disease.   PROCEDURAL DETAILS: The right groin was prepped draped and anesthetized with 1% lidocaine and  the right femoral artery is accessed with 4 Fr sheath using the Modified Seldinger technique. There was an indwelling IV in a right antecubital vein. Using normal sterile technique, the IV was changed out for a 5 Fr brachial sheath over a 0.018 inch wire. The left wrist was then prepped, draped, and anesthetized with 1% lidocaine. Using the modified Seldinger technique a 5/6 French Slender sheath was placed in the left radial artery. Intra-arterial verapamil was administered through the left radial artery sheath. IV heparin was administered after a JR4 catheter was advanced into the central aorta. A Swan-Ganz catheter was used for the right heart catheterization. Standard protocol was followed for recording of right heart pressures and sampling of oxygen saturations. Fick cardiac output was calculated. Standard Judkins catheters were used for selective coronary angiography, LIMA angiography, and SVG angiography. A pigtail is advanced through the 4 Fr RFA sheath into the central aorta. The aortic valve is crossed with an AL-1 catheter and a J-wire. Simultaneous pressures are recorded with pigtail catheters in the LV and central AO. Dobutamine is infused to assess for low-gradient aortic stenosis. This was discontinued prematurely because of frequent PVC's, bigeminy, and tachycardia (HR 120 bpm prior to dobutamine infusion). There were no immediate procedural complications. The patient was transferred to the post catheterization recovery area for further monitoring.  During this procedure the patient is administered a total of Versed 1 mg and Fentanyl 25 mg to achieve and maintain moderate conscious sedation. The patient's heart rate, blood pressure, and oxygen saturation are monitored continuously during the procedure. The period of conscious sedation is 68 minutes, of which I was present face-to-face 100% of this time.   Estimated blood loss <50 mL. .    Coronary Findings   Dominance: Right  Left Main   Ost LM lesion, 100% stenosed. The lesion is calcified. The left coronary artery is not selectively injected. It is known to be totally occluded.  Right Coronary Artery  Prox RCA lesion, 100% stenosed. The RCA is totally occluded. This is a chronic occlusion.  Graft Angiography  saphenous Graft to RPDA  SVG graft  was visualized by angiography. There is at least moderate stenosis in the stented segment of the saphenous vein graft to PDA. In some views the stenosis appears 50% and other views it appears as much is 70%.  Origin to Prox Graft lesion, 70% stenosed. The lesion was previously treated using a drug eluting stent between 6-12 months ago.  saphenous Graft to 1st Mrg  SVG graft was visualized by angiography.  Prox Graft to Mid Graft lesion, 20% stenosed. The lesion was previously treated. The saphenous vein graft OM is patent. The stented segment in the proximal body the graft is patent with about 20-25% restenosis. This graft also supplies a second OM branch via collateral flow.  Free LIMA Graft to Mid LAD  LIMA graft was visualized by angiography. The LIMA to LAD graft is widely patent without stenosis. The LAD beyond the graft is patent without significant stenosis. The diagonal fills retrograde from graft flow.  Right Heart   Right Heart Pressures Hemodynamic findings consistent with aortic valve stenosis. Elevated LV EDP consistent with volume overload. There are large V waves and the pulmonary capillary wedge tracing. Pulmonary artery pressures are mildly increased. LVEDP is mildly increased.    Left Heart   Aortic Valve There is severe aortic valve stenosis. The aortic valve is calcified. There is restricted aortic valve motion. Baseline aortic valve calculations show a peak to peak gradient of 26 mmHg, mean gradient 22 mmHg, cardiac output 6.7 L/m, and calculated aortic valve area of 1.25 cm.  A second calculation was done after IV dobutamine infusion was started at 5 mcg/kg/m and  this demonstrates a peak to peak gradient of 26 mmHg, mean gradient 25 mmHg, cardiac output of 3.9 L/m, and calculated aortic valve area of 0.82 cm    Coronary Diagrams   Diagnostic Diagram     Implants     No implant documentation for this case.  PACS Images   Show images for Cardiac catheterization   Link to Procedure Log   Procedure Log    Hemo Data   Flowsheet Row Most Recent Value  Fick Cardiac Output 3.93 L/min  Fick Cardiac Output Index 2.04 (L/min)/BSA  Aortic Mean Gradient 24.5 mmHg  Aortic Peak Gradient 26 mmHg  Aortic Valve Area 0.82  Aortic Value Area Index 0.43 cm2/BSA  RA A Wave 5 mmHg  RA V Wave 7 mmHg  RA Mean 4 mmHg  RV Systolic Pressure 37 mmHg  RV Diastolic Pressure 0 mmHg  RV EDP 3 mmHg  PA Systolic Pressure 40 mmHg  PA Diastolic Pressure 14 mmHg  PA Mean 27 mmHg  PW A Wave 19 mmHg  PW V Wave 36 mmHg  PW Mean 17 mmHg  AO Systolic Pressure [id number] mmHg  AO Diastolic Pressure 50 mmHg  AO Mean 71 mmHg  LV Systolic Pressure [id number] mmHg  LV Diastolic Pressure 9 mmHg  LV EDP 20 mmHg  QP/QS 1  TPVR Index 7.75 HRUI  TSVR Index 34.83 HRUI  PVR SVR Ratio 0.12  TPVR/TSVR Ratio 0.31   ADDENDUM REPORT: 04/27/2016 13:01  CLINICAL DATA:  Aortic stenosis  EXAM: Cardiac TAVR CT  TECHNIQUE: The patient was scanned on a Philips 256 scanner. A 120 kV retrospective scan was triggered in the descending thoracic aorta at 111 HU's. Gantry rotation speed was 270 msecs and collimation was .9 mm. No beta blockade or nitro were given. The 3D data set was reconstructed in 5% intervals of the R-R cycle. Systolic and diastolic phases were analyzed on  a dedicated work station using MPR, MIP and VRT modes. The patient received 80 cc of contrast.  FINDINGS: Aortic Valve: Calcified trileaflet valve moderate calcification of the STJ  Aorta: Moderate calcification of root, arch and descending thoracic aorta including the take off of the left  subclavian  Sinotubular Junction:  28 mm  Ascending Thoracic Aorta:  30 mm  Aortic Arch:  28 mm  Descending Thoracic Aorta:  26 mm  Sinus of Valsalva Measurements:  Non-coronary:  34 mm  Right -coronary:  33 mm  Left -coronary:  36 mm  Coronary Artery Height above Annulus:  Left Main:  16 mm  Right Coronary:  19 mm  Native LM and RCA occluded but patent SVG to OM, SVG to PDA and patent LIMA to LAD  Virtual Basal Annulus Measurements:  Maximum/Minimum Diameter:  25 mm x 33.5 mm  Perimeter:  96 mm  Area:  658 mm2  Coronary Arteries: Sufficient height above annulus but occluded with patent grafts as described above  Optimum Fluoroscopic Angle for Delivery:  LAO 9 degrees  IMPRESSION: 1) Calcified trileaflet aortic valve suitable for a 29 mm Sapien 3 valve  2) Occluded RCA/LM with patent grafts to OM, PDA and LIMA to LAD  3) Optimum angiographic angle for delivery LAO 9 degrees  Jenkins Rouge   Electronically Signed   By: Jenkins Rouge M.D.   On: 04/27/2016 13:01   Addended by Josue Hector, MD on 04/27/2016 1:03 PM    Study Result   EXAM: OVER-READ INTERPRETATION  CT CHEST  The following report is an over-read performed by radiologist Dr. Rebekah [facility] on 04/26/2016. This over-read does not include interpretation of cardiac or coronary anatomy or pathology. The coronary calcium score/coronary CTA interpretation by the cardiologist is attached.  COMPARISON:  Chest CT 12/11/2012.  FINDINGS: Extracardiac findings will be dictated with contemporaneously obtained CTA of the chest, abdomen and pelvis 05/06/2016.  IMPRESSION: See separate dictation for CTA of chest, abdomen and pelvis for full description of relevant extracardiac findings.  Electronically Signed: By: Vinnie Langton M.D. On: 04/26/2016 15:10      CLINICAL DATA:  83 year old male with history of severe aortic stenosis.  Preprocedural study prior to potential transcatheter aortic valve replacement (TAVR) procedure.  EXAM: CT ANGIOGRAPHY CHEST, ABDOMEN AND PELVIS  TECHNIQUE: Multidetector CT imaging through the chest, abdomen and pelvis was performed using the standard protocol during bolus administration of intravenous contrast. Multiplanar reconstructed images and MIPs were obtained and reviewed to evaluate the vascular anatomy.  CONTRAST:  70 mL of Isovue 370.  COMPARISON:  Chest CT 12/11/2012.  FINDINGS: CTA CHEST FINDINGS  Mediastinum/Lymph Nodes: Heart size is borderline enlarged. There is no significant pericardial fluid, thickening or pericardial calcification. There is aortic atherosclerosis, as well as atherosclerosis of the great vessels of the mediastinum and the coronary arteries, including calcified atherosclerotic plaque in the left main, left anterior descending, left circumflex and right coronary arteries. Status post median sternotomy for CABG, including LIMA to the LAD. Thickening calcification of the aortic valve (severe). No pathologically enlarged mediastinal or hilar lymph nodes. Circumferential thickening of the distal esophagus. No axillary lymphadenopathy.  Lungs/Pleura: Linear areas of architectural distortion in the lower lobes of the lungs bilaterally (left greater than right), most compatible with areas of chronic post infectious or inflammatory scarring, slightly increased compared to prior study from 12/11/2012. There is an 8 mm pulmonary nodule in the left lower lobe (image 49 of series 407) which is  unchanged compared to prior study from 12/11/2012, considered definitively benign. No other suspicious appearing pulmonary nodules or masses are noted. No acute consolidative airspace disease. No pleural effusions.  Musculoskeletal/Soft Tissues: Median sternotomy wires. Orthopedic fixation hardware in the lower cervical spine. There are no aggressive  appearing lytic or blastic lesions noted in the visualized portions of the skeleton.  CTA ABDOMEN AND PELVIS FINDINGS  Hepatobiliary: Liver has a slightly irregular contour, which could indicate early changes of cirrhosis. No discrete cystic or solid hepatic lesion. No intra or extrahepatic biliary ductal dilatation. Gallbladder is normal in appearance.  Pancreas: No pancreatic mass. No pancreatic ductal dilatation. No pancreatic or peripancreatic fluid or inflammatory changes.  Spleen: Unremarkable.  Adrenals/Urinary Tract: Mild bilateral renal atrophy and multifocal cortical thinning. No suspicious renal lesions. Several sub cm low-attenuation lesions are noted in the kidneys bilaterally, too small to definitively characterize, but statistically likely to represent tiny cysts. No hydroureteronephrosis. Urinary bladder is normal in appearance. Bilateral adrenal glands are normal in appearance.  Stomach/Bowel: Normal appearance of the stomach. No pathologic dilatation of small bowel or colon. The appendix is not confidently identified and may be surgically absent. Regardless, there are no inflammatory changes noted adjacent to the cecum to suggest the presence of an acute appendicitis at this time.  Vascular/Lymphatic: Aortic atherosclerosis, without evidence of aneurysm or dissection in the abdominal or pelvic vasculature. Vascular findings and measurements pertinent to potential TAVR procedure, as detailed below. 2 left-sided renal arteries appear widely patent, as does a single right-sided renal artery. Celiac axis, superior mesenteric artery and inferior mesenteric artery all appear widely patent, without definite hemodynamically significant stenosis. No lymphadenopathy noted in the abdomen or pelvis.  Reproductive: Prostate gland and seminal vesicles are unremarkable in appearance.  Other: No significant volume of ascites.  No  pneumoperitoneum.  Musculoskeletal: Postoperative changes of laminectomy at L3-L4. 1.3 x 2.2 cm fatty attenuation lesion in the left proximal vastus lateralis muscle, presumably a small intramuscular lipoma. There are no aggressive appearing lytic or blastic lesions noted in the visualized portions of the skeleton.  VASCULAR MEASUREMENTS PERTINENT TO TAVR:  AORTA:  Minimal Aortic Diameter -  14 x 12 mm  Severity of Aortic Calcification -  severe  RIGHT PELVIS:  Right Common Iliac Artery -  Minimal Diameter - 7.5 x 7.2 mm  Tortuosity - mild  Calcification - moderate to severe  Right External Iliac Artery -  Minimal Diameter - 8.8 x 6.7 mm  Tortuosity - moderate to severe  Calcification - mild  Right Common Femoral Artery -  Minimal Diameter - 6.1 x 5.9 mm  Tortuosity - moderate  Calcification - mild  LEFT PELVIS:  Left Common Iliac Artery -  Minimal Diameter - 7.9 x 7.1 mm  Tortuosity - mild  Calcification - moderate to severe  Left External Iliac Artery -  Minimal Diameter - 7.6 x 6.9 mm  Tortuosity - moderate to severe  Calcification - mild  Left Common Femoral Artery -  Minimal Diameter - 8.1 x 5.7 mm  Tortuosity - mild  Calcification - moderate  Review of the MIP images confirms the above findings.  IMPRESSION: 1. Vascular findings and measurements pertinent to potential TAVR procedure, as detailed above. This patient appears to have suitable pelvic arterial access bilaterally. 2. Severe thickening calcification of the aortic valve, compatible with the reported clinical history of severe aortic stenosis. 3. Marked thickening of the distal third of the esophagus. This may simply reflect chronic changes of reflux esophagitis. However,  if there is any clinical concern for Barrett's metaplasia or esophageal neoplasia, correlation with endoscopy should be considered. 4. Aortic atherosclerosis, in addition  to left main and 3 vessel coronary artery disease. Status post median sternotomy for CABG, including LIMA to the LAD. 5. Additional incidental findings, as above.   Electronically Signed   By: Vinnie Langton M.D.   On: 04/26/2016 16:06   RISK SCORES About the STS Risk Calculator Procedure: AV Replacement + CAB  Risk of Mortality: 8.486%  Morbidity or Mortality: 37.352%  Long Length of Stay: 20.838%  Short Length of Stay: 11.919%  Permanent Stroke: 3.42%  Prolonged Ventilation: 29.209%  DSW Infection: 0.78%  Renal Failure: 10.962%  Reoperation: 13.542%   Impression:  This 82 year old gentleman has severe, symptomatic aortic stenosis with NYHA class III symptoms of shortness of breath and fatigue with mild exertion. He has also had recurrent dizziness and syncope with a recent episode occurring during an episode of complete heart block noted on his implanted loop recorder. I have personally reviewed and interpreted his echo, TEE, cath and CTA studies. He has a trileaflet aortic valve with sclerotic and thickened leaflet with mild calcification that are poorly mobile. While his mean gradient is only 29 mm Hg by TEE his valve area is 0.88 by planimetry. His LVEF is reduced to 45-50% and I think that he actually does have severe aortic stenosis. His cath shows patent grafts with moderate in-stent restenosis of the proximal portion of the PDA graft but he is not having any chest pain or pressure. I think his recent syncopal episode was due to a combination of complete heart block and severe AS. I think aortic valve replacement is indicated in this active 82 year old who is limited by exertional SOB and fatigue. I think he would be a high risk candidate for open surgical AVR but TAVR would be a good alternative for him with much less risk. His cardiac CT shows anatomy favorable for a Sapien 3 valve and his abdominal and pelvic CT show adequate pelvic arterial anatomy for transfemoral access.    The patient was counseled at length regarding treatment alternatives for management of severe symptomatic aortic stenosis. The risks and benefits of surgical intervention has been discussed in detail. Long-term prognosis with medical therapy was discussed. Alternative approaches such as conventional surgical aortic valve replacement, transcatheter aortic valve replacement, and palliative medical therapy were compared and contrasted at length. This discussion was placed in the context of the patient's own specific clinical presentation and past medical history. All of his questions been addressed. The patient is eager to proceed with surgical management as soon as possible. He will need to have a second surgical evaluation. Assuming that after that evaluation we decide to proceed with transcatheter aortic valve replacement, a discussion was held regarding what types of management strategies would be attempted intraoperatively in the event of life-threatening complications, including whether or not the patient would be considered a candidate for the use of cardiopulmonary bypass and/or conversion to open sternotomy for attempted surgical intervention. The patient is aware of the fact that transient use of cardiopulmonary bypass may be necessary, but the patient specifically states that she would not wish to undergo redo median sternotomy under any circumstances, even if she were to develop potentially lethal complications related to transcatheter valves appointment.   The patient has been advised of a variety of complications that might develop including but not limited to risks of death, stroke, paravalvular leak, aortic  dissection or other major vascular complications, aortic annulus rupture, device embolization, cardiac rupture or perforation, mitral regurgitation, acute myocardial infarction, arrhythmia, heart block or bradycardia requiring permanent pacemaker placement, congestive heart failure, respiratory  failure, renal failure, pneumonia, infection, other late complications related to structural valve deterioration or migration, or other complications that might ultimately cause a temporary or permanent loss of functional independence or other long term morbidity. The patient provides full informed consent for the procedure as described and all questions were answered.   I spent 80 minutes performing this consultation and > 50% of this time was spent face to face counseling and coordinating the care of this patient's severe symptomatic aortic stenosis.  Plan:  He is going to be discharged home and will return to see Dr. Roxy Manns for a second surgical evaluation. He will have carotid dopplers and a physical therapy evaluation ordered. He will then be scheduled for transfemoral TAVR in the next few weeks.    Gaye Pollack, MD 04/28/2016 9:15 AM

## 2016-04-28 NOTE — Progress Notes (Signed)
[name] to be D/C'd Home per MD order. Discussed with the patient and all questions fully answered.    Medication List    TAKE these medications   allopurinol 300 MG tablet Commonly known as:  ZYLOPRIM Take 600 mg by mouth daily.   aspirin EC 81 MG tablet Take 81 mg by mouth daily.   atorvastatin 20 MG tablet Commonly known as:  LIPITOR Take 20 mg by mouth every evening.   clobetasol cream 0.05 % Commonly known as:  TEMOVATE Apply 1 application topically 2 (two) times daily.   clopidogrel 75 MG tablet Commonly known as:  PLAVIX Take 1 tablet (75 mg total) by mouth daily with breakfast.   fluticasone 50 MCG/ACT nasal spray Commonly known as:  FLONASE Place 1 spray into both nostrils 2 (two) times daily.   furosemide 20 MG tablet Commonly known as:  LASIX Take 20 mg by mouth daily.   Iron 325 (65 Fe) MG Tabs Take 1 tablet by mouth daily.   latanoprost 0.005 % ophthalmic solution Commonly known as:  XALATAN Place 1 drop into both eyes at bedtime.   levothyroxine 112 MCG tablet Commonly known as:  SYNTHROID, LEVOTHROID Take 112 mcg by mouth daily before breakfast.   metoprolol succinate 50 MG 24 hr tablet Commonly known as:  TOPROL-XL Take 1 tablet (50 mg total) by mouth daily. Take with or immediately following a meal.   multivitamin per tablet Take 1 tablet by mouth daily.   nitroGLYCERIN 0.4 MG SL tablet Commonly known as:  NITROSTAT Place 0.4 mg under the tongue every 5 (five) minutes x 3 doses as needed. For chest pain   pyridOXINE 100 MG tablet Commonly known as:  VITAMIN B-6 Take 100 mg by mouth daily.   tamsulosin 0.4 MG Caps capsule Commonly known as:  FLOMAX Take 0.4 mg by mouth daily.   valsartan 80 MG tablet Commonly known as:  DIOVAN Take 1 tablet (80 mg total) by mouth daily.       VVS, Skin clean, dry and intact without evidence of skin break down, no evidence of skin tears noted.  IV catheter discontinued intact. Site without  signs and symptoms of complications. Dressing and pressure applied.  An After Visit Summary was printed and given to the patient.  Patient escorted via [place], and D/C home via private auto.  Cyndra Numbers  04/28/2016 10:52 AM

## 2016-04-28 NOTE — Care Management Important Message (Signed)
Important Message  Patient Details  Name: [name] MRN: [id number] Date of Birth: 1931-05-04   Medicare Important Message Given:  Yes    Dameian Crisman Abena 04/28/2016, 11:22 AM

## 2016-05-05 DIAGNOSIS — Z23 Encounter for immunization: Secondary | ICD-10-CM | POA: Diagnosis not present

## 2016-05-05 DIAGNOSIS — R55 Syncope and collapse: Secondary | ICD-10-CM | POA: Diagnosis not present

## 2016-05-05 DIAGNOSIS — I5022 Chronic systolic (congestive) heart failure: Secondary | ICD-10-CM | POA: Diagnosis not present

## 2016-05-05 DIAGNOSIS — I060 Rheumatic aortic stenosis: Secondary | ICD-10-CM | POA: Diagnosis not present

## 2016-05-08 ENCOUNTER — Encounter: Payer: Self-pay | Admitting: Thoracic Surgery (Cardiothoracic Vascular Surgery)

## 2016-05-08 ENCOUNTER — Institutional Professional Consult (permissible substitution) (INDEPENDENT_AMBULATORY_CARE_PROVIDER_SITE_OTHER): Payer: Medicare HMO | Admitting: Thoracic Surgery (Cardiothoracic Vascular Surgery)

## 2016-05-08 VITALS — BP 129/67 | HR 70 | Resp 18 | Ht 68.0 in | Wt 178.0 lb

## 2016-05-08 DIAGNOSIS — I350 Nonrheumatic aortic (valve) stenosis: Secondary | ICD-10-CM | POA: Diagnosis not present

## 2016-05-08 DIAGNOSIS — R0602 Shortness of breath: Secondary | ICD-10-CM | POA: Diagnosis not present

## 2016-05-08 NOTE — Progress Notes (Addendum)
[facility]  CARDIOTHORACIC SURGERY CONSULTATION REPORT  Referring Provider is [name], MD PCP is [name], MD  Chief Complaint  Patient presents with  . Aortic Stenosis    TAVR eval...2ND....has had hospital consult with Dr. Cyndia Bent  . Shortness of Breath    HPI:  Patient is an 82 year old male with coronary artery disease status post coronary artery bypass grafting in the remote past, vein graft disease status post PCI and stenting on 2 previous occasions, aortic stenosis, cerebrovascular disease status post left carotid endarterectomy, hypertension, hyperlipidemia, and hypothyroidism who has been referred for a second surgical opinion to discuss treatment options for management of stage D severe symptomatic aortic stenosis. The patient underwent coronary artery bypass grafting by Dr. Cyndia Bent in 1995.  He did well for many years but eventually developed progression of coronary artery disease in both the native chordee circulation and in his vein grafts. He underwent PCI and stenting of the vein graft to the obtuse marginal branch of left circumflex coronary artery in October 2010 using a drug-eluting stent. Serial echocardiograms revealed aortic stenosis which has slowly progressed in severity.  Echocardiogram performed 11/02/2015 revealed mild to moderate left ventricular systolic dysfunction with ejection fraction estimated 45-50%.  Peak velocity across the aortic valve measured 3.2 m/s corresponding to mean transvalvular gradient estimated 19 mmHg.  Nuclear medicine stress test performed at that time revealed a small apical inferolateral defect that was reversible with a fixed inferior defect consistent with previous inferior wall myocardial infarction and peri-infarct ischemia.  The patient subsequently underwent diagnostic cardiac catheterization 11/17/2015 and was found to have complete occlusion of the left main coronary artery and the  right coronary arteries at their origin. There remained patent grafts from the patient's previous bypass surgery including widely patent left internal mammary artery to distal left anterior descending coronary artery, saphenous vein graft to the obtuse marginal branch of left circumflex coronary artery, and patent but severely diseased saphenous vein graft to the posterior descending coronary artery. There was high-grade proximal obstruction. There was mild to moderate aortic stenosis with mean transvalvular gradient measured 18 mmHg at catheterization, corresponding to valve area calculated 1.8 cm. The patient underwent PCI and stenting of the proximal segment of the saphenous vein graft to the posterior descending coronary artery using a drug-eluting stent.  Since then the patient has continued to have problems with exertional shortness of breath and fatigue that has gradually progressed over the past 6 months. In addition, he has suffered several syncopal episodes.  He underwent follow-up echocardiogram 03/29/2016 that revealed ejection fraction estimated 50%.  Peak velocity across the aortic valve was reported 3.0 m/s corresponding to mean transvalvular gradient estimated 21 mmHg. He underwent an exercise treadmill test on 04/12/2016 and was able to exercise to 85% of his maximum heart rate with no associated symptoms. He stopped the test because of fatigue. Repeat catheterization performed 04/19/2016 revealed moderate in-stent restenosis of the saphenous vein graft to the posterior descending coronary artery but findings otherwise no different than previously. Mean transvalvular gradient across the aortic valve was measured 24.5 mmHg corresponding to aortic valve area calculated 0.82 cm. The patient developed atrial tachycardia during the procedure and plans to perform dobutamine stress assessment of his transvalvular gradient were aborted. Transesophageal echocardiogram performed 04/21/2016 revealed findings  consistent with severe aortic stenosis. The aortic valve was severely thickened with restricted leaflet mobility. By planimetry the valve area was 0.88 cm. The patient subsequently suffered another frank syncopal  episode on 04/25/2016. At the time he was standing at the gas pump putting gas in his car. He sustained a contusion and laceration to the right for head and multiple abrasions on his this extremities. He was noted to be in complete heart block at the time of his event. He was admitted to the hospital and underwent implantation of a permanent pacemaker.  He tolerated this well and the patient has not had any further dizzy spells or syncope. However, he continues to complain of exertional shortness of breath with relatively mild activity. He states that this has not improved at all.  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 In retirement the patient has enjoyed golf incision. He has cut back on much of his activities over the past year because of progressive exertional shortness of breath. He no longer plays any golf. He has some issues with arthritis but he is able to ambulate without assistance. He has been using a cane for assistance with regards to balance because of his problems with recurrent syncope.    Past Medical History:  Diagnosis Date  . Aortic stenosis    Mild, echo, August, 2012  . Arthritis   . Benign prostatic hypertrophy   . CAD (coronary artery disease)    DES, SVG to circumflex marginal, October, 2010 ( SVG to PDA and PLA patent , LIMA to LAD patent, EF 60%, mild inferior hypokinesis  . Carotid artery disease ([place])    DES, SVG to circumflex marginal, October, 2010 ( SVG to PDA and PLA patent , LIMA to LAD patent, EF 60%, mild inferior hypokinesis; H/o mild CHF, CABG  . GERD (gastroesophageal reflux disease)    takes daily  . Head trauma 04/25/2016  . HTN (hypertension)    takes  meds daily  . Hx of decompressive lumbar laminectomy    Dr.Botero December, 2011  . Hyperlipidemia   . Hypothyroidism   . Nephrolithiasis   . Neuromuscular disorder ([place])   . Psoriasis    severe; with total skin exfoliation in the past resolved  . PVC's (premature ventricular contractions)    May, 2014  . Syncope    Remote, etiology unknown  . Ventricular tachycardia, non-sustained [name])     Past Surgical History:  Procedure Laterality Date  . APPENDECTOMY    . BACK SURGERY     x3  . bilateral L3-4 laminectomies    . CARDIAC CATHETERIZATION N/A 11/17/2015   Procedure: Right/Left Heart Cath and Coronary Angiography;  Surgeon: Peter M Martinique, MD;  Location: [facility];  Service: Cardiovascular;  Laterality: N/A;  . CARDIAC CATHETERIZATION N/A 11/17/2015   Procedure: Coronary Stent Intervention;  Surgeon: Peter M Martinique, MD;  Location: [place] [facility];  Service: Cardiovascular;  Laterality: N/A;  . CARDIAC CATHETERIZATION N/A 04/19/2016   Procedure: Right/Left Heart Cath and Coronary/Graft Angiography;  Surgeon: [name], MD;  Location: [place] [facility];  Service: Cardiovascular;  Laterality: N/A;  . [facility] Remote Monitoring  Oct. 29, 2015   by Dr. [name]  . CAROTID ENDARTERECTOMY Left 02-24-08   cea  Dr. Amedeo Plenty  . CARPAL TUNNEL RELEASE Bilateral 2010  . CATARACT EXTRACTION, BILATERAL    . COLONOSCOPY  11/23/2011   Procedure: COLONOSCOPY;  Surgeon: Rogene Houston, MD;  [facility];  Service: Endoscopy;  Laterality: N/A;  730  . CORONARY ARTERY BYPASS GRAFT  1995  . CORONARY STENT  PLACEMENT  11/17/2015   SVG   DES to RCA  . decompression of left median nerve    . EP IMPLANTABLE DEVICE N/A 04/27/2016   Procedure: Pacemaker Implant;  Surgeon: [name], MD;  Location: [place] [facility];  Service: Cardiovascular;  Laterality: N/A;  . JOINT REPLACEMENT Left Sept. 27, 2013   Knee  . left carotid endarectomy and Dacron patch angioplasty]    . LEFT  HEART CATHETERIZATION WITH CORONARY ANGIOGRAM N/A 12/27/2012   Procedure: LEFT HEART CATHETERIZATION WITH CORONARY ANGIOGRAM;  Surgeon: Burnell Blanks, MD;  Location: [facility] CATH LAB;  Service: Cardiovascular;  Laterality: N/A;  . posterior arthrodesis with autograft and allograft    . removal of synovial cyst    . SPINE SURGERY     X's 3  . TEE WITHOUT CARDIOVERSION N/A 04/21/2016   Procedure: TRANSESOPHAGEAL ECHOCARDIOGRAM (TEE);  Surgeon: Pixie Casino, MD;  Location: [facility] ENDOSCOPY;  Service: Cardiovascular;  Laterality: N/A;  . TONSILLECTOMY    . TOTAL KNEE ARTHROPLASTY  04/16/2012   Procedure: TOTAL KNEE ARTHROPLASTY;  Surgeon: Garald Balding, MD;  Location: [place];  Service: Orthopedics;  Laterality: Left;    Family History  Problem Relation Age of Onset  . Heart attack Mother   . Heart disease Mother     Heart Disease before age 53  . Heart disease Father     Heart Disease before age 82  . Hypertension Father   . Hyperlipidemia Father   . Heart attack Son 59  . Colon cancer Neg Hx     Social History   Social History  . Marital status: Married    Spouse name: N/A  . Number of children: 2  . Years of education: 16   Occupational History  . Retired    Social History Main Topics  . Smoking status: Never Smoker  . Smokeless tobacco: Never Used  . Alcohol use No     Comment: One beer per week.  . Drug use: No  . Sexual activity: Not Currently   Other Topics Concern  . Not on file   Social History Narrative   Lives at home with wife.   1 cup coffee per day.   Right-handed.    Current Outpatient Prescriptions  Medication Sig Dispense Refill  . allopurinol (ZYLOPRIM) 300 MG tablet Take 600 mg by mouth daily.     Marland Kitchen aspirin EC 81 MG tablet Take 81 mg by mouth daily.    Marland Kitchen atorvastatin (LIPITOR) 20 MG tablet Take 20 mg by mouth every evening.     . clopidogrel (PLAVIX) 75 MG tablet Take 1 tablet (75 mg total) by mouth daily with breakfast. 90 tablet 3  .  fluticasone (FLONASE) 50 MCG/ACT nasal spray Place 1 spray into both nostrils 2 (two) times daily.    . furosemide (LASIX) 20 MG tablet Take 20 mg by mouth daily.    Marland Kitchen latanoprost (XALATAN) 0.005 % ophthalmic solution Place 1 drop into both eyes at bedtime.    Marland Kitchen levothyroxine (SYNTHROID, LEVOTHROID) 112 MCG tablet Take 112 mcg by mouth daily before breakfast.    . metoprolol succinate (TOPROL-XL) 50 MG 24 hr tablet Take 1 tablet (50 mg total) by mouth daily. Take with or immediately following a meal. 30 tablet 1  . multivitamin (THERAGRAN) per tablet Take 1 tablet by mouth daily.     . nitroGLYCERIN (NITROSTAT) 0.4 MG SL tablet Place 0.4 mg under the tongue every 5 (five) minutes x 3 doses as  needed. For chest pain    . pyridOXINE (VITAMIN B-6) 100 MG tablet Take 100 mg by mouth daily.     . Tamsulosin HCl (FLOMAX) 0.4 MG CAPS Take 0.4 mg by mouth daily.     . valsartan (DIOVAN) 80 MG tablet Take 1 tablet (80 mg total) by mouth daily. 30 tablet 6  . clobetasol cream (TEMOVATE) [id number] % Apply 1 application topically 2 (two) times daily.    . Ferrous Sulfate (IRON) 325 (65 Fe) MG TABS Take 1 tablet by mouth daily.     No current facility-administered medications for this visit.     Allergies  Allergen Reactions  . Propylene Glycol Hives and Rash      Review of Systems:   General:  normal appetite, decreased energy, no weight gain, no weight loss, no fever  Cardiac:  no chest pain with exertion, no chest pain at rest, +SOB with exertion, no resting SOB, no PND, no orthopnea, + palpitations, + arrhythmia, no atrial fibrillation, no LE edema, + dizzy spells, + syncope  Respiratory:  + shortness of breath, no home oxygen, no productive cough, no dry cough, no bronchitis, no wheezing, no hemoptysis, no asthma, no pain with inspiration or cough, no sleep apnea, no CPAP at night  GI:   no difficulty swallowing, no reflux, no frequent heartburn, no hiatal hernia, no abdominal pain, no constipation,  no diarrhea, no hematochezia, no hematemesis, no melena  GU:   no dysuria,  no frequency, no urinary tract infection, no hematuria, no enlarged prostate, no kidney stones, no kidney disease  Vascular:  no pain suggestive of claudication, no pain in feet, occasional leg cramps, no varicose veins, no DVT, no non-healing foot ulcer  Neuro:   no stroke, no TIA's, no seizures, no headaches, non temporary blindness one eye,  no slurred speech, + peripheral neuropathy, no chronic pain, + instability of gait, no memory/cognitive dysfunction  Musculoskeletal: + arthritis, no joint swelling, no myalgias, no difficulty walking, normal mobility   Skin:   no rash, no itching, no skin infections, no pressure sores or ulcerations  Psych:   no anxiety, no depression, no nervousness, no unusual recent stress  Eyes:   no blurry vision, no floaters, no recent vision changes, + wears glasses or contacts  ENT:   no hearing loss, no loose or painful teeth, no dentures, last saw dentist May 2017  Hematologic:  + easy bruising, no abnormal bleeding, no clotting disorder, no frequent epistaxis  Endocrine:  no diabetes, does not check CBG's at home           Physical Exam:   BP 129/67   Pulse 70   Resp 18   Ht 5\' 8"  (1.727 m)   Wt 178 lb (80.7 kg)   SpO2 98% Comment: ON RA  BMI 27.06 kg/m   General:  Elderly,  well-appearing  HEENT:  Unremarkable   Neck:   no JVD, no bruits, no adenopathy   Chest:   clear to auscultation, symmetrical breath sounds, no wheezes, no rhonchi   CV:   RRR, grade IV/VI crescendo/decrescendo murmur heard best at RSB,  no diastolic murmur  Abdomen:  soft, non-tender, no masses   Extremities:  warm, well-perfused, pulses diminished, no LE edema  Rectal/GU  Deferred  Neuro:   Grossly non-focal and symmetrical throughout  Skin:   Clean and dry, no rashes, no breakdown   Diagnostic Tests:  Transthoracic Echocardiography  Patient:    [name], [name] MR #:  [id number] Study Date: 11/02/2015 Gender:     M Age:        51 Height:     175.3 cm Weight:     80.7 kg BSA:        2 m^2 Pt. Status: Room:   [place], MD  [place], MD  PERFORMING   Rayford Halsted  SONOGRAPHER  Alvino Chapel, RCS  cc:  ------------------------------------------------------------------- LV EF: 45% -   50%  ------------------------------------------------------------------- Study Conclusions  - Left ventricle: The cavity size was normal. Wall thickness was   increased in a pattern of mild LVH. Systolic function was mildly   reduced. The estimated ejection fraction was in the range of 45%   to 50%. There is hypokinesis of the basal-midinferolateral   myocardium. Features are consistent with a pseudonormal left   ventricular filling pattern, with concomitant abnormal relaxation   and increased filling pressure (grade 2 diastolic dysfunction). - Aortic valve: Moderately calcified annulus. Moderately calcified   leaflets. Cannot exclude functionally bicuspid valve. Cusp   separation was severely reduced. There was moderate to severe   stenosis. There was trivial regurgitation. Mean gradient (S): 19   mm Hg. Peak gradient (S): 40 mm Hg. Peak velocity ratio of LVOT   to aortic valve: 0.28. Valve area (Vmax): 1.29 cm^2. - Mitral valve: Calcified annulus. There was mild to moderate   regurgitation. - Left atrium: The atrium was mildly dilated. - Right atrium: The atrium was at the upper limits of normal in   size. - Tricuspid valve: There was mild regurgitation. - Pulmonary arteries: PA peak pressure: 27 mm Hg (S). - Pericardium, extracardiac: There was no pericardial effusion.  Impressions:  - Mild LVH with LVEF estimated in the range of 45-50%. Accuracy of   LVEF is difficult as some views look fairly well preserved   although perhaps foreshortened, and other views suggest at least   mildly reduced LVEF  overall. There is mid to basal inferolateral   hypokinesis and grade 2 diastolic dysfunction with increased LV   filling pressure. Mild left atrial enlargement. Moderate MAC with   mild to moderate mitral regurgitation. Moderate to severe   calcific aortic stenosis as outlined above with trivial aortic   regurgitation. Mild tricuspid regurgitation with PASP estimated   27 mmHg.  Transthoracic echocardiography.  M-mode, complete 2D, spectral Doppler, and color Doppler.  Birthdate:  Patient birthdate: 1931/05/18.  Age:  Patient is 82 yr old.  Sex:  Gender: male. BMI: 26.3 kg/m^2.  Blood pressure:     105/62  Patient status: Inpatient.  Study date:  Study date: 11/02/2015. Study time: 12:06 PM.  -------------------------------------------------------------------  ------------------------------------------------------------------- Left ventricle:  The cavity size was normal. Wall thickness was increased in a pattern of mild LVH. Systolic function was mildly reduced. The estimated ejection fraction was in the range of 45% to 50%.  Regional wall motion abnormalities:   There is hypokinesis of the basal-midinferolateral myocardium. Features are consistent with a pseudonormal left ventricular filling pattern, with concomitant abnormal relaxation and increased filling pressure (grade 2 diastolic dysfunction).  ------------------------------------------------------------------- Aortic valve:  Moderately calcified annulus. Moderately calcified leaflets. Cannot exclude functionally bicuspid valve. Cusp separation was severely reduced.  Doppler:   There was moderate to severe stenosis.   There was trivial regurgitation.    VTI ratio of LVOT to aortic valve: 0.33. Valve area (VTI): 1.49 cm^2. Indexed valve area (VTI): 0.75 cm^2/m^2. Peak velocity ratio of LVOT  to aortic valve: 0.28. Valve area (Vmax): 1.29 cm^2. Indexed valve area (Vmax): 0.65 cm^2/m^2. Mean velocity ratio of LVOT to  aortic valve: 0.32. Valve area (Vmean): 1.45 cm^2. Indexed valve area (Vmean): 0.73 cm^2/m^2.    Mean gradient (S): 19 mm Hg. Peak gradient (S): 40 mm Hg.  ------------------------------------------------------------------- Aorta:  Aortic root: The aortic root was normal in size.  ------------------------------------------------------------------- Mitral valve:   Calcified annulus.  Doppler:  There was mild to moderate regurgitation.    Peak gradient (D): 6 mm Hg.  ------------------------------------------------------------------- Left atrium:  The atrium was mildly dilated.  ------------------------------------------------------------------- Right ventricle:  The cavity size was normal. Systolic function was normal.  ------------------------------------------------------------------- Pulmonic valve:    The valve appears to be grossly normal. Doppler:  There was trivial regurgitation.  ------------------------------------------------------------------- Tricuspid valve:   The valve appears to be grossly normal. Doppler:  There was mild regurgitation.  ------------------------------------------------------------------- Right atrium:  The atrium was at the upper limits of normal in size.  ------------------------------------------------------------------- Pericardium:  There was no pericardial effusion.  ------------------------------------------------------------------- Systemic veins: Inferior vena cava: The vessel was dilated. The respirophasic diameter changes were blunted (< 50%), consistent with elevated central venous pressure.  ------------------------------------------------------------------- Measurements   Left ventricle                            Value          Reference  LV ID, ED, PLAX chordal           (H)     53.9  mm       43 - 52  LV ID, ES, PLAX chordal           (H)     45.81 mm       23 - 38  LV fx shortening, PLAX chordal    (L)     15    %         >=29  LV PW thickness, ED                       12.2  mm       ---------  IVS/LV PW ratio, ED                       0.98           <=1.3  Stroke volume, 2D                         108   ml       ---------  Stroke volume/bsa, 2D                     54    ml/m^2   ---------  LV end-diastolic volume, 1-p 99991111          97    ml       ---------  LV end-systolic volume, 1-p 99991111           73    ml       ---------  LV end-diastolic volume, 1-p Q000111Q          102   ml       ---------  LV end-systolic volume, 1-p Q000111Q           61    ml       ---------  LV ejection  fraction, 1-p A4C             41    %        ---------  Stroke volume, 1-p A4C                    41    ml       ---------  LV end-diastolic volume/bsa, 1-p          51    ml/m^2   ---------  [id number]  LV end-systolic volume/bsa, 1-p           30    ml/m^2   ---------  A4C  Stroke volume/bsa, 1-p A4C                21    ml/m^2   ---------  LV e&', lateral                            7.83  cm/s     ---------  LV E/e&', lateral                          15.58          ---------  LV e&', medial                             4.46  cm/s     ---------  LV E/e&', medial                           27.35          ---------  LV e&', average                            6.15  cm/s     ---------  LV E/e&', average                          19.85          ---------    Ventricular septum                        Value          Reference  IVS thickness, ED                         11.9  mm       ---------    LVOT                                      Value          Reference  LVOT ID, S                                24    mm       ---------  LVOT area                                 4.52  cm^2     ---------  LVOT peak velocity, S                     90.2  cm/s     ---------  LVOT mean velocity, S                     64.1  cm/s     ---------  LVOT VTI, S                               23.8  cm       ---------  LVOT peak gradient, S                     3     mm  Hg    ---------    Aortic valve                              Value          Reference  Aortic valve peak velocity, S             317   cm/s     ---------  Aortic valve mean velocity, S             200   cm/s     ---------  Aortic valve VTI, S                       72.1  cm       ---------  Aortic mean gradient, S                   19    mm Hg    ---------  Aortic peak gradient, S                   40    mm Hg    ---------  VTI ratio, LVOT/AV                        0.33           ---------  Aortic valve area, VTI                    1.49  cm^2     ---------  Aortic valve area/bsa, VTI                0.75  cm^2/m^2 ---------  Velocity ratio, peak, LVOT/AV             0.28           ---------  Aortic valve area, peak velocity          1.29  cm^2     ---------  Aortic valve area/bsa, peak               0.65  cm^2/m^2 ---------  velocity  Velocity ratio, mean, LVOT/AV             0.32           ---------  Aortic valve area, mean velocity          1.45  cm^2     ---------  Aortic valve area/bsa, mean               0.73  cm^2/m^2 ---------  velocity  Aorta                                     Value          Reference  Aortic root ID, ED                        33    mm       ---------    Left atrium                               Value          Reference  LA ID, A-P, ES                            51    mm       ---------  LA ID/bsa, A-P                    (H)     2.56  cm/m^2   <=2.2  LA volume, S                              70.4  ml       ---------  LA volume/bsa, S                          35.3  ml/m^2   ---------  LA volume, ES, 1-p A4C                    63.7  ml       ---------  LA volume/bsa, ES, 1-p A4C                31.9  ml/m^2   ---------  LA volume, ES, 1-p A2C                    76.7  ml       ---------  LA volume/bsa, ES, 1-p A2C                38.4  ml/m^2   ---------    Mitral valve                              Value          Reference  Mitral E-wave peak velocity                122   cm/s     ---------  Mitral A-wave peak velocity               77.6  cm/s     ---------  Mitral deceleration time                  211   ms       150 - 230  Mitral peak gradient, D                   6     mm Hg    ---------  Mitral E/A ratio, peak  1.6            ---------  Mitral maximal regurg velocity,           461   cm/s     ---------  PISA  Mitral regurg VTI, PISA                   144   cm       ---------  Mitral ERO, PISA                          0.1   cm^2     ---------  Mitral regurg volume, PISA                14    ml       ---------    Pulmonary arteries                        Value          Reference  PA pressure, S, DP                        27    mm Hg    <=30    Tricuspid valve                           Value          Reference  Tricuspid regurg peak velocity            174   cm/s     ---------  Tricuspid peak RV-RA gradient             12    mm Hg    ---------    Systemic veins                            Value          Reference  Estimated CVP                             15    mm Hg    ---------    Right ventricle                           Value          Reference  RV ID, ED, PLAX                           33.4  mm       19 - 38  TAPSE                                     16.5  mm       ---------  RV pressure, S, DP                        27    mm Hg    <=30  Legend: (L)  and  (H)  mark values outside specified reference range.  ------------------------------------------------------------------- Prepared and Electronically Authenticated by  Rozann Lesches, M.D. 2017-03-15T09:55:20   Transthoracic Echocardiography  Patient:    [name], [name] MR #:       [id number] Study Date: 03/29/2016 Gender:     M Age:        89 Height:     172.7 cm Weight:     81.6 kg BSA:        2 m^2 Pt. Status: Room:   SONOGRAPHER  [facility]  ATTENDING    Kate Sable, MD  [place], MD  [place], MD  PERFORMING   [facility] [place]  cc:  ------------------------------------------------------------------- LV EF: 50% -   55%  ------------------------------------------------------------------- History:   PMH:   Tachycardia and syncope.  Coronary artery disease.  Aortic valve disease.  Risk factors:  Hypertension. Dyslipidemia.  ------------------------------------------------------------------- Study Conclusions  - Left ventricle: The cavity size was normal. Wall thickness was   increased in a pattern of mild LVH. Systolic function was low   normal. The estimated ejection fraction was 50%. Features are   consistent with a pseudonormal left ventricular filling pattern,   with concomitant abnormal relaxation and increased filling   pressure (grade 2 diastolic dysfunction). Doppler parameters are   consistent with high ventricular filling pressure. - Regional wall motion abnormality: Hypokinesis of the mid inferior   and mid inferolateral myocardium. - Aortic valve: Moderately thickened, moderately calcified   leaflets. Cusp separation was severely reduced. There was   moderate stenosis. There was mild regurgitation. Peak velocity   (S): 302 cm/s. Mean gradient (S): 21 mm Hg. Valve area (VTI):   1.47 cm^2. Valve area (Vmax): 1.23 cm^2. Valve area (Vmean): 1.22   cm^2. - Mitral valve: Calcified annulus. There was moderate   regurgitation. - Left atrium: The atrium was moderately dilated. - Right ventricle: The cavity size was mildly dilated. Systolic   function was mildly reduced. - Tricuspid valve: Mildly thickened leaflets. There was mild   regurgitation.  ------------------------------------------------------------------- Labs, prior tests, procedures, and surgery: Echocardiography (March 2017).     EF was 50%. Aortic valve: peak gradient of 40 mm Hg.  Coronary artery bypass  grafting.  ------------------------------------------------------------------- Study data:  Comparison was made to the study of March 2017.  Study status:  Routine.  Procedure:  The patient reported no pain pre or post test. Transthoracic echocardiography. Image quality was adequate.  Study completion:  There were no complications. Transthoracic echocardiography.  M-mode, complete 2D, spectral Doppler, and color Doppler.  Birthdate:  Patient birthdate: 02-04-1931.  Age:  Patient is 82 yr old.  Sex:  Gender: male. BMI: 27.4 kg/m^2.  Blood pressure:     126/67  Patient status: Outpatient.  Study date:  Study date: 03/29/2016. Study time: 08:57 AM.  -------------------------------------------------------------------  ------------------------------------------------------------------- Left ventricle:  The cavity size was normal. Wall thickness was increased in a pattern of mild LVH. Systolic function was low normal. The estimated ejection fraction was 50%.  Regional wall motion abnormalities:  Hypokinesis of the mid inferior and mid inferolateral myocardium. Features are consistent with a pseudonormal left ventricular filling pattern, with concomitant abnormal relaxation and increased filling pressure (grade 2 diastolic dysfunction). Doppler parameters are consistent with high ventricular filling pressure.  ------------------------------------------------------------------- Aortic valve:   Moderately thickened, moderately calcified leaflets. Cusp separation was severely reduced.  Doppler:   There was moderate stenosis.   There was mild regurgitation.    VTI ratio of LVOT to aortic valve: 0.33. Valve area (VTI): 1.47 cm^2. Indexed valve area (VTI): 0.74 cm^2/m^2. Peak velocity ratio of  LVOT to aortic valve: 0.27. Valve area (Vmax): 1.23 cm^2. Indexed valve area (Vmax): 0.62 cm^2/m^2. Mean velocity ratio of LVOT to aortic valve: 0.27. Valve area (Vmean): 1.22 cm^2. Indexed valve  area (Vmean): 0.61 cm^2/m^2.    Mean gradient (S): 21 mm Hg. Peak gradient (S): 36 mm Hg.  ------------------------------------------------------------------- Aorta:  Aortic root: The aortic root was normal in size. Ascending aorta: The ascending aorta was normal in size.  ------------------------------------------------------------------- Mitral valve:   Calcified annulus.  Doppler:  There was moderate regurgitation.    Peak gradient (D): 9 mm Hg.  ------------------------------------------------------------------- Left atrium:  The atrium was moderately dilated.  ------------------------------------------------------------------- Atrial septum:  No defect or patent foramen ovale was identified.   ------------------------------------------------------------------- Right ventricle:  The cavity size was mildly dilated. Systolic function was mildly reduced.  ------------------------------------------------------------------- Pulmonic valve:    The valve appears to be grossly normal. Doppler:  There was trivial regurgitation.  ------------------------------------------------------------------- Tricuspid valve:   Mildly thickened leaflets.  Doppler:  There was mild regurgitation.  ------------------------------------------------------------------- Right atrium:  The atrium was normal in size.  ------------------------------------------------------------------- Pericardium:  There was no pericardial effusion.  ------------------------------------------------------------------- Systemic veins: Inferior vena cava: The vessel was normal in size. The respirophasic diameter changes were in the normal range (>= 50%), consistent with normal central venous pressure.  ------------------------------------------------------------------- Measurements   Left ventricle                            Value          Reference  LV ID, ED, PLAX chordal           (H)     53.6  mm        43 - 52  LV ID, ES, PLAX chordal           (H)     46.7  mm       23 - 38  LV fx shortening, PLAX chordal    (L)     13    %        >=29  LV PW thickness, ED                       10.5  mm       ---------  IVS/LV PW ratio, ED                       0.99           <=1.3  Stroke volume, 2D                         99    ml       ---------  Stroke volume/bsa, 2D                     50    ml/m^2   ---------  LV ejection fraction, 1-p A4C             50    %        ---------  LV end-diastolic volume, 2-p              115   ml       ---------  LV end-systolic volume, 2-p               59    ml       ---------  LV ejection fraction, 2-p                 49    %        ---------  Stroke volume, 2-p                        56    ml       ---------  LV end-diastolic volume/bsa, 2-p          58    ml/m^2   ---------  LV end-systolic volume/bsa, 2-p           29    ml/m^2   ---------  Stroke volume/bsa, 2-p                    28.2  ml/m^2   ---------  LV e&', lateral                            8.61  cm/s     ---------  LV E/e&', lateral                          17.19          ---------  LV e&', medial                             6.58  cm/s     ---------  LV E/e&', medial                           22.49          ---------  LV e&', average                            7.6   cm/s     ---------  LV E/e&', average                          19.49          ---------    Ventricular septum                        Value          Reference  IVS thickness, ED                         10.4  mm       ---------    LVOT                                      Value          Reference  LVOT ID, S                                24    mm       ---------  LVOT area                                 4.52  cm^2     ---------  LVOT peak velocity, S                     82.1  cm/s     ---------  LVOT mean velocity, S                     57.8  cm/s     ---------  LVOT VTI, S                               21.9  cm       ---------     Aortic valve                              Value          Reference  Aortic valve peak velocity, S             302   cm/s     ---------  Aortic valve mean velocity, S             215   cm/s     ---------  Aortic valve VTI, S                       67.2  cm       ---------  Aortic mean gradient, S                   21    mm Hg    ---------  Aortic peak gradient, S                   36    mm Hg    ---------  VTI ratio, LVOT/AV                        0.33           ---------  Aortic valve area, VTI                    1.47  cm^2     ---------  Aortic valve area/bsa, VTI                0.74  cm^2/m^2 ---------  Velocity ratio, peak, LVOT/AV             0.27           ---------  Aortic valve area, peak velocity          1.23  cm^2     ---------  Aortic valve area/bsa, peak               0.62  cm^2/m^2 ---------  velocity  Velocity ratio, mean, LVOT/AV             0.27           ---------  Aortic valve area, mean velocity          1.22  cm^2     ---------  Aortic valve area/bsa, mean               0.61  cm^2/m^2 ---------  velocity  Aortic regurg pressure half-time          770   ms       ---------    Aorta  Value          Reference  Aortic root ID, ED                        32    mm       ---------    Left atrium                               Value          Reference  LA ID, A-P, ES                            43    mm       ---------  LA ID/bsa, A-P                            2.15  cm/m^2   <=2.2  LA volume, S                              65.7  ml       ---------  LA volume/bsa, S                          32.9  ml/m^2   ---------  LA volume, ES, 1-p A4C                    73.3  ml       ---------  LA volume/bsa, ES, 1-p A4C                36.7  ml/m^2   ---------  LA volume, ES, 1-p A2C                    58.6  ml       ---------  LA volume/bsa, ES, 1-p A2C                29.4  ml/m^2   ---------    Mitral valve                              Value           Reference  Mitral E-wave peak velocity               148   cm/s     ---------  Mitral A-wave peak velocity               107   cm/s     ---------  Mitral deceleration time                  156   ms       150 - 230  Mitral peak gradient, D                   9     mm Hg    ---------  Mitral E/A ratio, peak                    1.4            ---------  Mitral regurg VTI, PISA  169   cm       ---------  Mitral ERO, PISA                          0.07  cm^2     ---------  Mitral regurg volume, PISA                12    ml       ---------    Pulmonary arteries                        Value          Reference  PA pressure, S, DP                        22    mm Hg    <=30    Tricuspid valve                           Value          Reference  Tricuspid regurg peak velocity            220   cm/s     ---------  Tricuspid peak RV-RA gradient             19    mm Hg    ---------    Systemic veins                            Value          Reference  Estimated CVP                             3     mm Hg    ---------    Right ventricle                           Value          Reference  RV pressure, S, DP                        22    mm Hg    <=30    Pulmonic valve                            Value          Reference  Pulmonic regurg velocity, ED              138   cm/s     ---------  Pulmonic regurg gradient, ED              8     mm Hg    ---------  Legend: (L)  and  (H)  mark values outside specified reference range.  ------------------------------------------------------------------- Prepared and Electronically Authenticated by  Kate Sable, MD 2017-08-09T11:09:21   Transesophageal Echocardiography  Patient: [name], [name] MR #: [id number] Study Date: 04/21/2016 Gender: M Age: 4 Height: 172.7 cm Weight: 79.4 kg BSA: 1.97 m^2 Pt. Status: Room:  ORDERING [name], MD REFERRING [name],  MD [place] MD ATTENDING Lyman Bishop MD PERFORMING Lyman Bishop MD SONOGRAPHER Madelin Rear, RDCS  cc:  -------------------------------------------------------------------  LV EF: 45% - 50%  ------------------------------------------------------------------- Indications: Aortic stenosis 424.1.  ------------------------------------------------------------------- History: PMH: Syncope. Coronary artery disease. Aortic valve disease. Risk factors: Hypertension.  ------------------------------------------------------------------- Study Conclusions  - Left ventricle: Mild LVH. Systolic function was mildly reduced. The estimated ejection fraction was in the range of 45% to 50%. Inferior and inferolateral hypokinesis. - Aortic valve: Sclerotic, rheumatic-appearing leaflets with severely reduced excursion. The base of the leaflets are calcified. There is severe stenosis. AVA is around 0.8-0.9 cm2. Peak and mean gradients of 29 and 47 mmHg. AVA by planimetry was 0.88 cm2. There was heavy aliasing of color doppler in the ascending aorta suggestive of severe aortic stenosis and high velocity jet. There is trivial aortic insufficiency. Mean gradient (S): 29 mm Hg. Peak gradient (S): 47 mm Hg. - Aorta: Grade 2 atheroma of the aortic arch. - Mitral valve: Mildly thickened leaflets . There was mild regurgitation. - Left atrium: The atrium was dilated. No evidence of thrombus in the atrial cavity or appendage. - Pulmonary veins: No anomaly. - Atrial septum: No defect or patent foramen ovale was identified. - Pulmonic valve: No evidence of vegetation.  Impressions:  - Severe aortic stenosis - rheumatic appearing valve. AVA around 0.8-0.9 cm2. Trivial AI. Mild mitral regurgitation. LVEF 45-50% with inferior and inferolateral  hypokinesis.  ------------------------------------------------------------------- Study data: Study status: Routine. Consent: The risks, benefits, and alternatives to the procedure were explained to the patient and informed consent was obtained. Procedure: The patient reported no pain pre or post test. Initial setup. The patient was brought to the laboratory. Surface ECG leads were monitored. Sedation. Deep sedation was administered by anesthesiology staff. Sedation was reversed at the end of the procedure. Transesophageal echocardiography. An adult multiplane transesophageal probe was inserted by the attending cardiologistwithout difficulty. Image quality was adequate. Intravenous contrast (agitated saline) was administered. Study completion: The patient tolerated the procedure well. There were no complications. Administered medications: Propofol, 60mg , IV. Diagnostic transesophageal echocardiography. 2D and color Doppler. Birthdate: Patient birthdate: 10-Nov-1930. Age: Patient is 82 yr old. Sex: Gender: male. BMI: 26.6 kg/m^2. Blood pressure: 97/69 Patient status: Inpatient. Study date: Study date: 04/21/2016. Study time: 12:00 PM. Location: Endoscopy.  -------------------------------------------------------------------  ------------------------------------------------------------------- Left ventricle: Mild LVH. Systolic function was mildly reduced. The estimated ejection fraction was in the range of 45% to 50%. Inferior and inferolateral hypokinesis.  ------------------------------------------------------------------- Aortic valve: Sclerotic, rheumatic-appearing leaflets with severely reduced excursion. The base of the leaflets are calcified. There is severe stenosis. AVA is around 0.8-0.9 cm2. Peak and mean gradients of 29 and 47 mmHg. AVA by planimetry was 0.88 cm2. There was heavy aliasing of color doppler in the ascending  aorta suggestive of severe aortic stenosis and high velocity jet. There is trivial aortic insufficiency. Doppler: Mean gradient (S): 29 mm Hg. Peak gradient (S): 47 mm Hg.  ------------------------------------------------------------------- Aorta: Grade 2 atheroma of the aortic arch.  ------------------------------------------------------------------- Mitral valve: Mildly thickened leaflets . Doppler: There was mild regurgitation.  ------------------------------------------------------------------- Left atrium: The atrium was dilated. No evidence of thrombus in the atrial cavity or appendage.  ------------------------------------------------------------------- Atrial septum: No defect or patent foramen ovale was identified.  ------------------------------------------------------------------- Pulmonary veins: No anomaly.  ------------------------------------------------------------------- Right ventricle: The cavity size was normal. Wall thickness was normal. Systolic function was normal.  ------------------------------------------------------------------- Pulmonic valve: Structurally normal valve. Cusp separation was normal. No evidence of vegetation. Doppler: There was trivial regurgitation.  ------------------------------------------------------------------- Tricuspid valve: Doppler: There was trivial regurgitation.  ------------------------------------------------------------------- Pulmonary artery: The main pulmonary artery was normal-sized.  ------------------------------------------------------------------- Right atrium: The  atrium was normal in size.  ------------------------------------------------------------------- Pericardium: There was no pericardial effusion.  ------------------------------------------------------------------- Post procedure conclusions Ascending Aorta:  - Grade 2 atheroma of the aortic  arch.  ------------------------------------------------------------------- Measurements  LVOT Value LVOT ID, S 20 mm LVOT area 3.14 cm^2  Aortic valve Value Aortic valve peak velocity, S 343 cm/s Aortic valve mean velocity, S 258 cm/s Aortic valve VTI, S 83.7 cm Aortic mean gradient, S 29 mm Hg Aortic peak gradient, S 47 mm Hg  Legend: (L) and (H) mark values outside specified reference range.  ------------------------------------------------------------------- Prepared and Electronically Authenticated by  Lyman Bishop MD 2017-09-01T13:20:39  [name], MD (Primary)    Procedures   Right/Left Heart Cath and Coronary/Graft Angiography  Conclusion   1. Severe native vessel coronary artery disease with total occlusion of the left main coronary artery and right coronary artery 2. Status post CABG with continued patency of the LIMA to LAD, saphenous vein graft to OM, and saphenous vein graft to PDA with moderate in-stent restenosis in the PDA graft 3. Moderate to severe aortic stenosis with aortic valve calculations as outlined below 4. Large V waves raising suspicion for significant mitral regurgitation 5. Patient with narrow complex tachycardia heart rate approximately 110 120 bpm throughout the procedure, complicating hemodynamic assessment  Complicated case and patient who has coronary artery disease with moderate in-stent restenosis of the saphenous vein graft RCA, probable severe aortic stenosis, and conduction disease with frequent PVCs and possibly atrial tachycardia during his catheterization. Will review his case with the multidisciplinary heart team including electrophysiology. I think it TEE would add important information about his valvular heart disease. He may have significant  mitral regurgitation in addition to his aortic stenosis. After TEE would refer him to cardiac surgery for evaluation. If he has functional mitral regurgitation and his arrhythmia does not require further treatment, he may benefit from TAVR.  Indications   Severe aortic stenosis [I35.0 (ICD-10-CM)]  Procedural Details/Technique   Technical Details INDICATION: Aortic stenosis, possible severe low gradient aortic stenosis. Known CAD with hx CABG and PCI of saphenous vein graft disease.   PROCEDURAL DETAILS: The right groin was prepped draped and anesthetized with 1% lidocaine and the right femoral artery is accessed with 4 Fr sheath using the Modified Seldinger technique. There was an indwelling IV in a right antecubital vein. Using normal sterile technique, the IV was changed out for a 5 Fr brachial sheath over a 0.018 inch wire. The left wrist was then prepped, draped, and anesthetized with 1% lidocaine. Using the modified Seldinger technique a 5/6 French Slender sheath was placed in the left radial artery. Intra-arterial verapamil was administered through the left radial artery sheath. IV heparin was administered after a JR4 catheter was advanced into the central aorta. A Swan-Ganz catheter was used for the right heart catheterization. Standard protocol was followed for recording of right heart pressures and sampling of oxygen saturations. Fick cardiac output was calculated. Standard Judkins catheters were used for selective coronary angiography, LIMA angiography, and SVG angiography. A pigtail is advanced through the 4 Fr RFA sheath into the central aorta. The aortic valve is crossed with an AL-1 catheter and a J-wire. Simultaneous pressures are recorded with pigtail catheters in the LV and central AO. Dobutamine is infused to assess for low-gradient aortic stenosis. This was discontinued prematurely because of frequent PVC's, bigeminy, and tachycardia (HR 120 bpm prior to dobutamine infusion). There were  no immediate procedural complications. The patient was transferred to the post catheterization recovery area  for further monitoring.  During this procedure the patient is administered a total of Versed 1 mg and Fentanyl 25 mg to achieve and maintain moderate conscious sedation. The patient's heart rate, blood pressure, and oxygen saturation are monitored continuously during the procedure. The period of conscious sedation is 68 minutes, of which I was present face-to-face 100% of this time.   Estimated blood loss <50 mL. .    Coronary Findings   Dominance: Right  Left Main  Ost LM lesion, 100% stenosed. The lesion is calcified. The left coronary artery is not selectively injected. It is known to be totally occluded.  Right Coronary Artery  Prox RCA lesion, 100% stenosed. The RCA is totally occluded. This is a chronic occlusion.  Graft Angiography  saphenous Graft to RPDA  SVG graft was visualized by angiography. There is at least moderate stenosis in the stented segment of the saphenous vein graft to PDA. In some views the stenosis appears 50% and other views it appears as much is 70%.  Origin to Prox Graft lesion, 70% stenosed. The lesion was previously treated using a drug eluting stent between 6-12 months ago.  saphenous Graft to 1st Mrg  SVG graft was visualized by angiography.  Prox Graft to Mid Graft lesion, 20% stenosed. The lesion was previously treated. The saphenous vein graft OM is patent. The stented segment in the proximal body the graft is patent with about 20-25% restenosis. This graft also supplies a second OM branch via collateral flow.  Free LIMA Graft to Mid LAD  LIMA graft was visualized by angiography. The LIMA to LAD graft is widely patent without stenosis. The LAD beyond the graft is patent without significant stenosis. The diagonal fills retrograde from graft flow.  Right Heart   Right Heart Pressures Hemodynamic findings consistent with aortic valve stenosis.  Elevated LV EDP consistent with volume overload. There are large V waves and the pulmonary capillary wedge tracing. Pulmonary artery pressures are mildly increased. LVEDP is mildly increased.    Left Heart   Aortic Valve There is severe aortic valve stenosis. The aortic valve is calcified. There is restricted aortic valve motion. Baseline aortic valve calculations show a peak to peak gradient of 26 mmHg, mean gradient 22 mmHg, cardiac output 6.7 L/m, and calculated aortic valve area of 1.25 cm.  A second calculation was done after IV dobutamine infusion was started at 5 mcg/kg/m and this demonstrates a peak to peak gradient of 26 mmHg, mean gradient 25 mmHg, cardiac output of 3.9 L/m, and calculated aortic valve area of 0.82 cm    Coronary Diagrams   Diagnostic Diagram     Implants        No implant documentation for this case.  PACS Images   Show images for Cardiac catheterization   Link to Procedure Log   Procedure Log    Hemo Data   Flowsheet Row Most Recent Value  Fick Cardiac Output 3.93 L/min  Fick Cardiac Output Index 2.04 (L/min)/BSA  Aortic Mean Gradient 24.5 mmHg  Aortic Peak Gradient 26 mmHg  Aortic Valve Area 0.82  Aortic Value Area Index 0.43 cm2/BSA  RA A Wave 5 mmHg  RA V Wave 7 mmHg  RA Mean 4 mmHg  RV Systolic Pressure 37 mmHg  RV Diastolic Pressure 0 mmHg  RV EDP 3 mmHg  PA Systolic Pressure 40 mmHg  PA Diastolic Pressure 14 mmHg  PA Mean 27 mmHg  PW A Wave 19 mmHg  PW V Wave 36 mmHg  PW  Mean 17 mmHg  AO Systolic Pressure [id number] mmHg  AO Diastolic Pressure 50 mmHg  AO Mean 71 mmHg  LV Systolic Pressure [id number] mmHg  LV Diastolic Pressure 9 mmHg  LV EDP 20 mmHg  QP/QS 1  TPVR Index 7.75 HRUI  TSVR Index 34.83 HRUI  PVR SVR Ratio 0.12  TPVR/TSVR Ratio 0.31   ADDENDUM REPORT: 04/27/2016 13:01  CLINICAL DATA: Aortic stenosis  EXAM: Cardiac TAVR CT  TECHNIQUE: The patient was scanned on a Philips 256 scanner. A 120  kV retrospective scan was triggered in the descending thoracic aorta at 111 HU's. Gantry rotation speed was 270 msecs and collimation was .9 mm. No beta blockade or nitro were given. The 3D data set was reconstructed in 5% intervals of the R-R cycle. Systolic and diastolic phases were analyzed on a dedicated work station using MPR, MIP and VRT modes. The patient received 80 cc of contrast.  FINDINGS: Aortic Valve: Calcified trileaflet valve moderate calcification of the STJ  Aorta: Moderate calcification of root, arch and descending thoracic aorta including the take off of the left subclavian  Sinotubular Junction: 28 mm  Ascending Thoracic Aorta: 30 mm  Aortic Arch: 28 mm  Descending Thoracic Aorta: 26 mm  Sinus of Valsalva Measurements:  Non-coronary: 34 mm  Right -coronary: 33 mm  Left -coronary: 36 mm  Coronary Artery Height above Annulus:  Left Main: 16 mm  Right Coronary: 19 mm  Native LM and RCA occluded but patent SVG to OM, SVG to PDA and patent LIMA to LAD  Virtual Basal Annulus Measurements:  Maximum/Minimum Diameter: 25 mm x 33.5 mm  Perimeter: 96 mm  Area: 658 mm2  Coronary Arteries: Sufficient height above annulus but occluded with patent grafts as described above  Optimum Fluoroscopic Angle for Delivery: LAO 9 degrees  IMPRESSION: 1) Calcified trileaflet aortic valve suitable for a 29 mm Sapien 3 valve  2) Occluded RCA/LM with patent grafts to OM, PDA and LIMA to LAD  3) Optimum angiographic angle for delivery LAO 9 degrees  Jenkins Rouge   Electronically Signed By: Jenkins Rouge M.D. On: 04/27/2016 13:01   Addended by Josue Hector, MD on 04/27/2016 1:03 PM    Study Result   EXAM: OVER-READ INTERPRETATION CT CHEST  The following report is an over-read performed by radiologist Dr. Rebekah Chesterfield [name] on 04/26/2016. This over-read does not include  interpretation of cardiac or coronary anatomy or pathology. The coronary calcium score/coronary CTA interpretation by the cardiologist is attached.  COMPARISON: Chest CT 12/11/2012.  FINDINGS: Extracardiac findings will be dictated with contemporaneously obtained CTA of the chest, abdomen and pelvis 05/06/2016.  IMPRESSION: See separate dictation for CTA of chest, abdomen and pelvis for full description of relevant extracardiac findings.  Electronically Signed: By: Vinnie Langton M.D. On: 04/26/2016 15:10      CLINICAL DATA: 82 year old male with history of severe aortic stenosis. Preprocedural study prior to potential transcatheter aortic valve replacement (TAVR) procedure.  EXAM: CT ANGIOGRAPHY CHEST, ABDOMEN AND PELVIS  TECHNIQUE: Multidetector CT imaging through the chest, abdomen and pelvis was performed using the standard protocol during bolus administration of intravenous contrast. Multiplanar reconstructed images and MIPs were obtained and reviewed to evaluate the vascular anatomy.  CONTRAST: 70 mL of Isovue 370.  COMPARISON: Chest CT 12/11/2012.  FINDINGS: CTA CHEST FINDINGS  Mediastinum/Lymph Nodes: Heart size is borderline enlarged. There is no significant pericardial fluid, thickening or pericardial calcification. There is aortic atherosclerosis, as well as atherosclerosis of the great vessels of  the mediastinum and the coronary arteries, including calcified atherosclerotic plaque in the left main, left anterior descending, left circumflex and right coronary arteries. Status post median sternotomy for CABG, including LIMA to the LAD. Thickening calcification of the aortic valve (severe). No pathologically enlarged mediastinal or hilar lymph nodes. Circumferential thickening of the distal esophagus. No axillary lymphadenopathy.  Lungs/Pleura: Linear areas of architectural distortion in the lower lobes of the lungs bilaterally (left  greater than right), most compatible with areas of chronic post infectious or inflammatory scarring, slightly increased compared to prior study from 12/11/2012. There is an 8 mm pulmonary nodule in the left lower lobe (image 49 of series 407) which is unchanged compared to prior study from 12/11/2012, considered definitively benign. No other suspicious appearing pulmonary nodules or masses are noted. No acute consolidative airspace disease. No pleural effusions.  Musculoskeletal/Soft Tissues: Median sternotomy wires. Orthopedic fixation hardware in the lower cervical spine. There are no aggressive appearing lytic or blastic lesions noted in the visualized portions of the skeleton.  CTA ABDOMEN AND PELVIS FINDINGS  Hepatobiliary: Liver has a slightly irregular contour, which could indicate early changes of cirrhosis. No discrete cystic or solid hepatic lesion. No intra or extrahepatic biliary ductal dilatation. Gallbladder is normal in appearance.  Pancreas: No pancreatic mass. No pancreatic ductal dilatation. No pancreatic or peripancreatic fluid or inflammatory changes.  Spleen: Unremarkable.  Adrenals/Urinary Tract: Mild bilateral renal atrophy and multifocal cortical thinning. No suspicious renal lesions. Several sub cm low-attenuation lesions are noted in the kidneys bilaterally, too small to definitively characterize, but statistically likely to represent tiny cysts. No hydroureteronephrosis. Urinary bladder is normal in appearance. Bilateral adrenal glands are normal in appearance.  Stomach/Bowel: Normal appearance of the stomach. No pathologic dilatation of small bowel or colon. The appendix is not confidently identified and may be surgically absent. Regardless, there are no inflammatory changes noted adjacent to the cecum to suggest the presence of an acute appendicitis at this time.  Vascular/Lymphatic: Aortic atherosclerosis, without evidence of aneurysm or  dissection in the abdominal or pelvic vasculature. Vascular findings and measurements pertinent to potential TAVR procedure, as detailed below. 2 left-sided renal arteries appear widely patent, as does a single right-sided renal artery. Celiac axis, superior mesenteric artery and inferior mesenteric artery all appear widely patent, without definite hemodynamically significant stenosis. No lymphadenopathy noted in the abdomen or pelvis.  Reproductive: Prostate gland and seminal vesicles are unremarkable in appearance.  Other: No significant volume of ascites. No pneumoperitoneum.  Musculoskeletal: Postoperative changes of laminectomy at L3-L4. 1.3 x 2.2 cm fatty attenuation lesion in the left proximal vastus lateralis muscle, presumably a small intramuscular lipoma. There are no aggressive appearing lytic or blastic lesions noted in the visualized portions of the skeleton.  VASCULAR MEASUREMENTS PERTINENT TO TAVR:  AORTA:  Minimal Aortic Diameter - 14 x 12 mm  Severity of Aortic Calcification - severe  RIGHT PELVIS:  Right Common Iliac Artery -  Minimal Diameter - 7.5 x 7.2 mm  Tortuosity - mild  Calcification - moderate to severe  Right External Iliac Artery -  Minimal Diameter - 8.8 x 6.7 mm  Tortuosity - moderate to severe  Calcification - mild  Right Common Femoral Artery -  Minimal Diameter - 6.1 x 5.9 mm  Tortuosity - moderate  Calcification - mild  LEFT PELVIS:  Left Common Iliac Artery -  Minimal Diameter - 7.9 x 7.1 mm  Tortuosity - mild  Calcification - moderate to severe  Left External Iliac Artery -  Minimal Diameter - 7.6 x 6.9 mm  Tortuosity - moderate to severe  Calcification - mild  Left Common Femoral Artery -  Minimal Diameter - 8.1 x 5.7 mm  Tortuosity - mild  Calcification - moderate  Review of the MIP images confirms the above findings.  IMPRESSION: 1. Vascular findings and  measurements pertinent to potential TAVR procedure, as detailed above. This patient appears to have suitable pelvic arterial access bilaterally. 2. Severe thickening calcification of the aortic valve, compatible with the reported clinical history of severe aortic stenosis. 3. Marked thickening of the distal third of the esophagus. This may simply reflect chronic changes of reflux esophagitis. However, if there is any clinical concern for Barrett's metaplasia or esophageal neoplasia, correlation with endoscopy should be considered. 4. Aortic atherosclerosis, in addition to left main and 3 vessel coronary artery disease. Status post median sternotomy for CABG, including LIMA to the LAD. 5. Additional incidental findings, as above.   Electronically Signed By: Vinnie Langton M.D. On: 04/26/2016 16:06   STS Risk Calculator  Procedure    Redo CABG + AVR  Risk of Mortality   5.8% Morbidity or Mortality  28.8% Prolonged LOS   13.6% Short LOS    17.6% Permanent Stroke   3.4% Prolonged Vent Support  16.6% DSW Infection    0.5% Renal Failure    8.7% Reoperation    11.1%    Impression:  Patient has stage D severe symptomatic aortic stenosis, coronary artery disease involving the native coronary arteries and vein graft disease status post coronary artery bypass grafting in the distant past, mild to moderate left ventricular systolic dysfunction, recurrent atrial tachycardia and recurrent syncopal episodes with recently discovered complete heart block for which he underwent implantation of a permanent pacemaker. He continues to experience progressive symptoms of exertional shortness of breath and fatigue consistent with chronic combined systolic and diastolic congestive heart failure, [place] Heart Association functional class III. He has not had any further syncopal episodes since his pacemaker was implanted.  I have personally reviewed the patient's previous transthoracic and  transesophageal echocardiograms. His aortic valve is trileaflet with severe thickening and restricted leaflet mobility involving all 3 leaflets of the aortic valve. Left ventricular systolic function is moderately reduced. Peak velocity across the aortic valve has not been measured greater than 4 m/s, but valve area has been measured less than 0.9 cm using both TEE and direct hemodynamic assessment at the time of recent catheterization.  Catheterization also confirms the presence of severe coronary artery disease with 100% occlusion of the left main coronary artery and the proximal right coronary artery. The patient has continued patent bypass grafts including left internal mammary artery to the distal left anterior descending coronary artery, saphenous vein graft to the obtuse marginal branch coronary artery, and saphenous vein graft to the posterior descending coronary artery. However, there is significant in-stent restenosis of the proximal segment of the saphenous vein graft to the posterior descending coronary artery.  I agree the patient needs aortic valve replacement. He may need coronary revascularization to his right cornea system is well. Risks associated with conventional surgery for aortic valve replacement and redo coronary artery bypass grafting would clearly be at least moderately elevated. Transcatheter aortic valve replacement with or without repeat PCI to the proximal segment of the vein graft to the right coronary system would likely be a far less invasive and less risky alternative.  I have personally reviewed the patient's cardiac gated CT angiogram of the heart and CT  angiogram of the aorta and iliac vessels. Patient has findings on cardiac gated CTA consistent with severe aortic stenosis with anatomical characteristics suitable for transcatheter aortic valve replacement without any significant complicating features. The patient appears to have adequate pelvic vascular access to facilitate a  transfemoral approach, although there is significant calcification in the common femoral artery.   Plan:  The patient was counseled at length regarding treatment alternatives for management of severe symptomatic aortic stenosis and coronary artery disease. Alternative approaches such as conventional aortic valve replacement with redo CABG, transcatheter aortic valve replacement with or without PCI, and palliative medical therapy were compared and contrasted at length.  The risks associated with conventional surgery were been discussed in detail, as were expectations for post-operative convalescence. Long-term prognosis with medical therapy was discussed. This discussion was placed in the context of the patient's own specific clinical presentation and past medical history.  All of his questions been addressed.  The patient hopes to proceed with transcatheter aortic valve replacement as previously planned.  Following the decision to proceed with transcatheter aortic valve replacement, a discussion has been held regarding what types of management strategies would be attempted intraoperatively in the event of life-threatening complications, including whether or not the patient would be considered a candidate for the use of cardiopulmonary bypass and/or conversion to open sternotomy for attempted surgical intervention.  The patient has been advised of a variety of complications that might develop including but not limited to risks of death, stroke, paravalvular leak, aortic dissection or other major vascular complications, aortic annulus rupture, device embolization, cardiac rupture or perforation, mitral regurgitation, acute myocardial infarction, arrhythmia, heart block or bradycardia requiring permanent pacemaker placement, congestive heart failure, respiratory failure, renal failure, pneumonia, infection, other late complications related to structural valve deterioration or migration, or other complications  that might ultimately cause a temporary or permanent loss of functional independence or other long term morbidity.  The patient provides full informed consent for the procedure as described and all questions were answered.   I spent in excess of 90 minutes during the conduct of this office consultation and >50% of this time involved direct face-to-face encounter with the patient for counseling and/or coordination of their care.     Valentina Gu. Roxy Manns, MD 05/08/2016 1:12 PM

## 2016-05-08 NOTE — Patient Instructions (Signed)
   Patient should continue taking all current medications without change through the day before surgery.  Patient should have nothing to eat or drink after midnight the night before surgery.  On the morning of surgery patient should take only Toprol and Synthroid with a sip of water.  \

## 2016-05-09 ENCOUNTER — Ambulatory Visit: Payer: Medicare HMO | Attending: Cardiovascular Disease | Admitting: Physical Therapy

## 2016-05-09 ENCOUNTER — Encounter: Payer: Self-pay | Admitting: Physical Therapy

## 2016-05-09 ENCOUNTER — Encounter: Payer: Medicare HMO | Admitting: Thoracic Surgery (Cardiothoracic Vascular Surgery)

## 2016-05-09 DIAGNOSIS — R2689 Other abnormalities of gait and mobility: Secondary | ICD-10-CM | POA: Insufficient documentation

## 2016-05-09 DIAGNOSIS — R293 Abnormal posture: Secondary | ICD-10-CM | POA: Diagnosis not present

## 2016-05-09 NOTE — Therapy (Signed)
[place], [place], [address] Phone: [phone]   Fax:  [phone]  Physical Therapy Evaluation  Patient Details  Name: [name] MRN: [id number] Date of Birth: February 22, 1931 Referring Provider: Dr. [name]  Encounter Date: 05/09/2016      PT End of Session - 05/09/16 1322    Visit Number 1   PT Start Time [id number]   PT Stop Time [id number]   PT Time Calculation (min) 54 min   Equipment Utilized During Treatment Gait belt      Past Medical History:  Diagnosis Date  . Aortic stenosis    Mild, echo, August, 2012  . Arthritis   . Benign prostatic hypertrophy   . CAD (coronary artery disease)    DES, SVG to circumflex marginal, October, 2010 ( SVG to PDA and PLA patent , LIMA to LAD patent, EF 60%, mild inferior hypokinesis  . Carotid artery disease ([place])    DES, SVG to circumflex marginal, October, 2010 ( SVG to PDA and PLA patent , LIMA to LAD patent, EF 60%, mild inferior hypokinesis; H/o mild CHF, CABG  . GERD (gastroesophageal reflux disease)    takes daily  . Head trauma 04/25/2016  . HTN (hypertension)    takes meds daily  . Hx of decompressive lumbar laminectomy    Dr.Botero December, 2011  . Hyperlipidemia   . Hypothyroidism   . Nephrolithiasis   . Neuromuscular disorder ([place])   . Psoriasis    severe; with total skin exfoliation in the past resolved  . PVC's (premature ventricular contractions)    May, 2014  . Syncope    Remote, etiology unknown  . Ventricular tachycardia, non-sustained [facility])     Past Surgical History:  Procedure Laterality Date  . APPENDECTOMY    . BACK SURGERY     x3  . bilateral L3-4 laminectomies    . CARDIAC CATHETERIZATION N/A 11/17/2015   Procedure: Right/Left Heart Cath and Coronary Angiography;  Surgeon: Peter M Martinique, MD;  Location: [place] CV LAB;  Service: Cardiovascular;  Laterality: N/A;  . CARDIAC CATHETERIZATION N/A 11/17/2015   Procedure:  Coronary Stent Intervention;  Surgeon: Peter M Martinique, MD;  Location:  CV LAB;  Service: Cardiovascular;  Laterality: N/A;  . CARDIAC CATHETERIZATION N/A 04/19/2016   Procedure: Right/Left Heart Cath and Coronary/Graft Angiography;  Surgeon: [name], MD;  Location: [place] CV LAB;  Service: Cardiovascular;  Laterality: N/A;  . Carelink Remote Monitoring  Oct. 29, 2015   by Dr. [name]  . CAROTID ENDARTERECTOMY Left 02-24-08   cea  Dr. Amedeo Plenty  . CARPAL TUNNEL RELEASE Bilateral 2010  . CATARACT EXTRACTION, BILATERAL    . COLONOSCOPY  11/23/2011   Procedure: COLONOSCOPY;  Surgeon: Rogene Houston, MD;  [facility];  Service: Endoscopy;  Laterality: N/A;  730  . CORONARY ARTERY BYPASS GRAFT  1995  . CORONARY STENT PLACEMENT  11/17/2015   SVG   DES to RCA  . decompression of left median nerve    . EP IMPLANTABLE DEVICE N/A 04/27/2016   Procedure: Pacemaker Implant;  Surgeon: [name], MD;  Location: [facility] CV LAB;  Service: Cardiovascular;  Laterality: N/A;  . JOINT REPLACEMENT Left Sept. 27, 2013   Knee  . left carotid endarectomy and Dacron patch angioplasty]    . LEFT HEART CATHETERIZATION WITH CORONARY ANGIOGRAM N/A 12/27/2012   Procedure: LEFT HEART CATHETERIZATION WITH CORONARY ANGIOGRAM;  Surgeon: Burnell Blanks, MD;  Location:  [facility] CATH LAB;  Service: Cardiovascular;  Laterality: N/A;  . posterior arthrodesis with autograft and allograft    . removal of synovial cyst    . SPINE SURGERY     X's 3  . TEE WITHOUT CARDIOVERSION N/A 04/21/2016   Procedure: TRANSESOPHAGEAL ECHOCARDIOGRAM (TEE);  Surgeon: Pixie Casino, MD;  Location: [facility]COPY;  Service: Cardiovascular;  Laterality: N/A;  . TONSILLECTOMY    . TOTAL KNEE ARTHROPLASTY  04/16/2012   Procedure: TOTAL KNEE ARTHROPLASTY;  Surgeon: Garald Balding, MD;  Location: [facility];  Service: Orthopedics;  Laterality: Left;    There were no vitals filed for this visit.       Subjective  Assessment - 05/09/16 1259    Subjective Pt reports gradual exertional shortness of breath over the past 6 months and significantly worsened over the past couple of weeks. Pt also had several syncopal episodes with most recent on 9/5 resulting in hospital admission. Pt had pacemaker implanted on 9/7 and dizziness/syncope has subsided but exertional shortness of breath remains. Pt was working out at the gym regularly up until the day of most recetn syncopal episode.    Patient Stated Goals get back to golf, fishing   Currently in Pain? No/denies            [facility] PT Assessment - 05/09/16 0001      Assessment   Medical Diagnosis severe aortic stenosis   Referring Provider Dr. [name]   Onset Date/Surgical Date 04/25/16     Precautions   Precautions ICD/Pacemaker     Restrictions   Weight Bearing Restrictions No     Balance Screen   Has the patient fallen in the past 6 months Yes   How many times? 2-3 mainly due to dizziness/syncope   Has the patient had a decrease in activity level because of a fear of falling?  No   Is the patient reluctant to leave their home because of a fear of falling?  No     Home Environment   Living Environment Private residence   Living Arrangements Spouse/significant other   Home Access Stairs to enter   Entrance Stairs-Number of Steps 2-3   Entrance Stairs-Rails None  holds to post   [place] One level     Prior Function   Level of Independence Independent with community mobility with device     Posture/Postural Control   Posture/Postural Control Postural limitations   Postural Limitations Rounded Shoulders;Forward head     ROM / Strength   AROM / PROM / Strength AROM;Strength     AROM   Overall AROM Comments WNL throughout, LUE not tested due to recent pacemaker impant     Strength   Overall Strength Comments 5/5 throughout except L DF 4/5; LUE not tested due to pacemaker implant on 9/7   Strength Assessment Site Hand    Right/Left hand Right;Left   Right Hand Grip (lbs) 52   Left Hand Grip (lbs) 44  L hand dominant     Ambulation/Gait   Gait Pattern Wide base of support   Gait Comments Pt demonstrates increased deviation from straight path with fatigue.      6 minute walk test results    Aerobic Endurance Distance Walked 740   Endurance additional comments Pt required 3 standing rest breaks throughout the 6 minute walk test, each increasing in length - 15 sec, 30 sec, 60 seconds. All were due to shortness of breath. O2 remained above 90% throughout. Pt rated  shortness of breath somewhat severe by the end of the test.           [facility] Pre-Surgical Assessment - 05/09/16 0001    5 Meter Walk Test- trial 1 5 sec   5 Meter Walk Test- trial 2 5 sec.    5 Meter Walk Test- trial 3 4 sec.  </= 6 seconds WNL   5 meter walk test average 4.67 sec   Timed Up & Go Test trial  12 sec.   Comments </= 12 sec not indicative of high fall risk   4 Stage Balance Test tolerated for:  10 sec.   4 Stage Balance Test Position 2   comment Inability to hold position 3 indicates increased fall risk   Sit To Stand Test- trial 1 33 sec.   Comment </= 14.8 secs WNL for age/gender   ADL/IADL Independent with: Bathing;Dressing;Meal prep;Finances  has never done yardwork   ADL/IADL Fraility Index Vulnerable   6 Minute Walk- Baseline yes   BP (mmHg) 124/64   HR (bpm) 54   02 Sat (%RA) 99 %   Modified Borg Scale for Dyspnea 0- Nothing at all   Perceived Rate of Exertion (Borg) 6-   6 Minute Walk Post Test yes   BP (mmHg) 148/70   HR (bpm) 64   02 Sat (%RA) 92 %   Modified Borg Scale for Dyspnea 4- somewhat severe   Perceived Rate of Exertion (Borg) 7- Very, very light  as long as he can stand as rest as needed                         PT Education - 05/09/16 1444    Education provided Yes   Education Details fall risk, use of cane recommendations   Person(s) Educated Patient   Methods Explanation    Comprehension Verbalized understanding                    Plan - 05/09/16 1444    Clinical Impression Statement Pt is an 82 yo male presenting to OP PT for evaluation prior to possible TAVR surgery due to severe aortic stenosis. Pt reports onset of exertional shortness of breath approximately 6 months ago which has really worsened in these last couple of weeks. Symptoms are limiting golf, fishing and exercising. He becomes shortness of breath even walking in the house at times. Pt is frustrated with losing his independence. Pt has also experienced a lot of dizziness and syncopal episodes with the most recent on 9/5. Pt had a pacemaker implanted on 9/7 and these symptoms have subsided; his shortness of breath remains. Pt presents with good ROM and strength, fair balance and is at high fall risk per 4 stage balance test, good walking speed and poor aerobic endurance per 6 minute walk test. Pt able to ambulate 54% of the normal distance that is normal for his age/gender and required 3 standing rest breaks. Pt reported 4/10 shortness of breath on modified scale for dyspnea. Pt ambulated a total of 740 feet in 6 minute walk. Pt's blood pressure increased significantly with 6 minute walk test.    PT Frequency One time visit   Consulted and Agree with Plan of Care Patient      Patient will benefit from skilled therapeutic intervention in order to improve the following deficits and impairments:     Visit Diagnosis: Other abnormalities of gait and mobility - Plan: PT plan of care cert/re-cert  Abnormal posture - Plan: PT plan of care cert/re-cert      G-Codes - [phone]    Functional Assessment Tool Used 6 minute walk    Functional Limitation Mobility: Walking and moving around   Mobility: Walking and Moving Around Current Status [phone]) At least 40 percent but less than 60 percent impaired, limited or restricted   Mobility: Walking and Moving Around Goal Status [phone]) At least 40  percent but less than 60 percent impaired, limited or restricted   Mobility: Walking and Moving Around Discharge Status [phone]) At least 40 percent but less than 60 percent impaired, limited or restricted       Problem List Patient Active Problem List   Diagnosis Date Noted  . Head trauma 04/25/2016  . Paresthesia 01/20/2016  . Dyspnea 11/17/2015  . CAD (coronary artery disease) of bypass graft 11/17/2015  . Abnormal cardiovascular stress test   . Spinal stenosis of lumbar region 07/22/2015  . Abnormality of gait 07/22/2015  . Transient complete heart block ([place]) May 29, 202015  . Hypertensive cardiovascular disease May 29, 202015  . Bradycardia 12/23/2013  . Aftercare following surgery of the circulatory system, [place] 06/30/2013  . PVC's (premature ventricular contractions)   . Neuromuscular disorder ([place])   . Nephrolithiasis   . Hypothyroidism   . Ventricular tachycardia, non-sustained ([place])   . Osteoarthritis of knee 04/19/2012  . Hyponatremia 04/19/2012  . Hypokalemia 04/19/2012  . Postoperative anemia due to acute blood loss 04/19/2012  . Aortic stenosis   . Occlusion and stenosis of carotid artery without mention of cerebral infarction 02/13/2012  . Carotid artery disease ([place])   . Hyperlipidemia   . HTN (hypertension)   . CAD (coronary artery disease)   . Syncope   . Psoriasis   . Ejection fraction   . Fluid overload   . Carpal tunnel syndrome   . Hx of CABG   . BENIGN PROSTATIC HYPERTROPHY, HX OF 06/11/2009    [place], PT 05/09/2016, 2:48 PM  [facility] [address] [place], [place], [address] Phone: [phone]   Fax:  [phone]  Name: [name] MRN: [id number] Date of Birth: Feb 18, 1931

## 2016-05-10 ENCOUNTER — Ambulatory Visit (INDEPENDENT_AMBULATORY_CARE_PROVIDER_SITE_OTHER): Payer: Medicare HMO | Admitting: *Deleted

## 2016-05-10 ENCOUNTER — Ambulatory Visit
Admission: RE | Admit: 2016-05-10 | Discharge: 2016-05-10 | Disposition: A | Discharge status: Home / Self Care | Payer: Medicare HMO | Source: Ambulatory Visit | Attending: Internal Medicine | Admitting: Internal Medicine

## 2016-05-10 DIAGNOSIS — I4729 Other ventricular tachycardia: Secondary | ICD-10-CM

## 2016-05-10 DIAGNOSIS — Z950 Presence of cardiac pacemaker: Secondary | ICD-10-CM | POA: Diagnosis not present

## 2016-05-10 DIAGNOSIS — R55 Syncope and collapse: Secondary | ICD-10-CM | POA: Diagnosis not present

## 2016-05-10 DIAGNOSIS — I472 Ventricular tachycardia: Secondary | ICD-10-CM | POA: Diagnosis not present

## 2016-05-11 ENCOUNTER — Telehealth: Payer: Self-pay

## 2016-05-11 ENCOUNTER — Telehealth: Payer: Self-pay | Admitting: Cardiovascular Disease

## 2016-05-11 ENCOUNTER — Ambulatory Visit: Payer: Medicare HMO | Admitting: Cardiovascular Disease

## 2016-05-11 ENCOUNTER — Telehealth: Payer: Self-pay | Admitting: Internal Medicine

## 2016-05-11 NOTE — Telephone Encounter (Signed)
Called pt and let him know that the results from chest x-ray and the leads are in stable position.

## 2016-05-11 NOTE — Telephone Encounter (Signed)
I spoke with [name] and she contacted our office because the pt's TAVR on 9/26 has not been authorized with [place].  I made her aware that this is handled by our billing department.  I will forward this to billing to follow-up with [name] at [facility].

## 2016-05-11 NOTE — Telephone Encounter (Signed)
error 

## 2016-05-11 NOTE — Telephone Encounter (Signed)
New Message  [name] voiced from [facility] is needing authorization for procedure on 9.22.17.  [name] voiced one requires authorization and the other one does not.  Please f/u with [name]

## 2016-05-12 ENCOUNTER — Encounter (HOSPITAL_COMMUNITY)
Admission: RE | Admit: 2016-05-12 | Discharge: 2016-05-12 | Disposition: A | Discharge status: Home / Self Care | Payer: Medicare HMO | Source: Ambulatory Visit | Attending: Cardiovascular Disease | Admitting: Cardiovascular Disease

## 2016-05-12 ENCOUNTER — Encounter (HOSPITAL_COMMUNITY): Payer: Self-pay

## 2016-05-12 ENCOUNTER — Other Ambulatory Visit: Payer: Self-pay | Admitting: Thoracic Surgery (Cardiothoracic Vascular Surgery)

## 2016-05-12 ENCOUNTER — Other Ambulatory Visit: Payer: Self-pay | Admitting: Cardiovascular Disease

## 2016-05-12 ENCOUNTER — Ambulatory Visit (HOSPITAL_COMMUNITY)
Admission: RE | Admit: 2016-05-12 | Discharge: 2016-05-12 | Disposition: A | Discharge status: Home / Self Care | Payer: Medicare HMO | Source: Ambulatory Visit | Attending: Cardiovascular Disease | Admitting: Cardiovascular Disease

## 2016-05-12 DIAGNOSIS — Z01818 Encounter for other preprocedural examination: Secondary | ICD-10-CM | POA: Insufficient documentation

## 2016-05-12 DIAGNOSIS — I350 Nonrheumatic aortic (valve) stenosis: Secondary | ICD-10-CM | POA: Diagnosis not present

## 2016-05-12 DIAGNOSIS — I6523 Occlusion and stenosis of bilateral carotid arteries: Secondary | ICD-10-CM | POA: Diagnosis not present

## 2016-05-12 HISTORY — DX: Polyneuropathy, unspecified: G62.9

## 2016-05-12 LAB — CBC
HCT: 30.7 % — ABNORMAL LOW (ref 39.0–52.0)
Hemoglobin: 10 g/dL — ABNORMAL LOW (ref 13.0–17.0)
MCH: 31.5 pg (ref 26.0–34.0)
MCHC: 32.6 g/dL (ref 30.0–36.0)
MCV: 96.8 fL (ref 78.0–100.0)
PLATELETS: 174 10*3/uL (ref 150–400)
RBC: 3.17 MIL/uL — ABNORMAL LOW (ref 4.22–5.81)
RDW: 15 % (ref 11.5–15.5)
WBC: 8 10*3/uL (ref 4.0–10.5)

## 2016-05-12 LAB — BLOOD GAS, ARTERIAL
ACID-BASE EXCESS: 2 mmol/L (ref 0.0–2.0)
Bicarbonate: 26 mmol/L (ref 20.0–28.0)
DRAWN BY: 206361
O2 SAT: 96.9 %
PCO2 ART: 40.1 mmHg (ref 32.0–48.0)
Patient temperature: 98.6
pH, Arterial: 7.427 (ref 7.350–7.450)
pO2, Arterial: 89.9 mmHg (ref 83.0–108.0)

## 2016-05-12 LAB — URINALYSIS, ROUTINE W REFLEX MICROSCOPIC
BILIRUBIN URINE: NEGATIVE
Glucose, UA: NEGATIVE mg/dL
HGB URINE DIPSTICK: NEGATIVE
KETONES UR: NEGATIVE mg/dL
Leukocytes, UA: NEGATIVE
Nitrite: NEGATIVE
Protein, ur: NEGATIVE mg/dL
Specific Gravity, Urine: 1.01 (ref 1.005–1.030)
pH: 7 (ref 5.0–8.0)

## 2016-05-12 LAB — PROTIME-INR
INR: 1.12
PROTHROMBIN TIME: 14.5 s (ref 11.4–15.2)

## 2016-05-12 LAB — COMPREHENSIVE METABOLIC PANEL
ALT: 38 U/L (ref 17–63)
ANION GAP: 10 (ref 5–15)
AST: 46 U/L — AB (ref 15–41)
Albumin: 4.1 g/dL (ref 3.5–5.0)
Alkaline Phosphatase: 81 U/L (ref 38–126)
BUN: 21 mg/dL — AB (ref 6–20)
CHLORIDE: 100 mmol/L — AB (ref 101–111)
CO2: 24 mmol/L (ref 22–32)
Calcium: 9.5 mg/dL (ref 8.9–10.3)
Creatinine, Ser: 0.9 mg/dL (ref 0.61–1.24)
GFR calc Af Amer: >60 mL/min (ref >60)
Glucose, Bld: 110 mg/dL — ABNORMAL HIGH (ref 65–99)
POTASSIUM: 4.1 mmol/L (ref 3.5–5.1)
Sodium: 134 mmol/L — ABNORMAL LOW (ref 135–145)
Total Bilirubin: 0.7 mg/dL (ref 0.3–1.2)
Total Protein: 6.8 g/dL (ref 6.5–8.1)

## 2016-05-12 LAB — TYPE AND SCREEN
ABO/RH(D): O POS
Antibody Screen: NEGATIVE

## 2016-05-12 LAB — APTT: aPTT: 37 seconds — ABNORMAL HIGH (ref 24–36)

## 2016-05-12 MED ORDER — CHLORHEXIDINE GLUCONATE 4 % EX LIQD: 60.0000 mL | Freq: Once | CUTANEOUS | Status: DC

## 2016-05-12 MED ORDER — CHLORHEXIDINE GLUCONATE 4 % EX LIQD: 30.0000 mL | CUTANEOUS | Status: DC

## 2016-05-12 MED ORDER — CHLORHEXIDINE GLUCONATE 0.12 % MT SOLN: 15.0000 mL | Freq: Once | OROMUCOSAL | Status: DC

## 2016-05-12 NOTE — Progress Notes (Addendum)
Cardiologist is Dr.Koneswaran   Medical Md is Dr.Roy Willey Blade  Multiple echo reports in epic   Multiple stress test reports in epic   Heart cath in epic from 03/2016  EKG in epic from 04-28-16  CXR in epic from 05-10-16

## 2016-05-12 NOTE — Telephone Encounter (Signed)
I spoke with Dorian Pod (billing) and the pt's clinical information has been faxed to the insurance company.  She will follow-up with the hospital in regards to authorization for TAVR.

## 2016-05-12 NOTE — Progress Notes (Signed)
Pre-op Cardiac Surgery  Carotid Findings:  Right - 40% to 59% ICA stenosis. Left -1% to 39% ICA stenosis. Bilateral - Vertebral artery flow is antegrade.  Upper Extremity Right Left  Brachial Pressures 126 Triphasic 144 Triphasic  Radial Waveforms Triphasic Triphasic  Ulnar Waveforms Triphasic Triphasic  Palmar Arch (Allen's Test) Normal Normal   Findings:  Palmar arch evaluation  - Doppler waveforms remained normal bilaterally with both radial and ulnar compressions.

## 2016-05-12 NOTE — Pre-Procedure Instructions (Signed)
[name]  05/12/2016      [facility] - [place], [place] [address] Phone: [phone] Fax: [phone]  [id number] [place], [address] [place] [address] Phone: [phone] Fax: [phone]  [facility] [place], [address] AT [place] [place] [place] [address] Phone: [phone] Fax: [phone]    Your procedure is scheduled on Tues, Sept 26 @ 7:30 AM  Report to [facility] Admitting at 5:30 AM  Call this number if you have problems the morning of surgery:  [phone]   Remember:  Do not eat food or drink liquids after midnight.  Take these medicines the morning of surgery with A SIP OF WATER Allopurinol(Zyloprim),Flonase(Fluticasone), Synthroid(Levothyroxine),and Flomax(Tamsulosin)             No Goody's,BC's,Aleve,Advil,Motrin,Ibuprofen,Fish Oil,or any Herbal Medications.    Do not wear jewelry.  Do not wear lotions, powders,colognes, or deoderant.             Men may shave face and neck.  Do not bring valuables to the hospital.  [facility] is not responsible for any belongings or valuables.  Contacts, dentures or bridgework may not be worn into surgery.  Leave your suitcase in the car.  After surgery it may be brought to your room.  For patients admitted to the hospital, discharge time will be determined by your treatment team.  Patients discharged the day of surgery will not be allowed to drive home.    [facility]  Before surgery, you can play an important role.  Because skin is not sterile, your skin needs to be as free of germs as possible.  You can reduce the number of germs on you skin by washing with CHG (chlorahexidine gluconate) soap before surgery.  CHG is an antiseptic cleaner which kills germs and bonds  with the skin to continue killing germs even after washing.  Please DO NOT use if you have an allergy to CHG or antibacterial soaps.  If your skin becomes reddened/irritated stop using the CHG and inform your nurse when you arrive at Short Stay.  Do not shave (including legs and underarms) for at least 48 hours prior to the first CHG shower.  You may shave your face.  Please follow these instructions carefully:   1.  Shower with CHG Soap the night before surgery and the                                morning of Surgery.  2.  If you choose to wash your hair, wash your hair first as usual with your       normal shampoo.  3.  After you shampoo, rinse your hair and body thoroughly to remove the                      Shampoo.  4.  Use CHG as you would any other liquid soap.  You can apply chg directly       to the skin and wash gently with scrungie or a clean washcloth.  5.  Apply the CHG Soap to your body ONLY FROM THE NECK DOWN.  Do not use on open wounds or open sores.  Avoid contact with your eyes,       ears, mouth and genitals (private parts).  Wash genitals (private parts)       with your normal soap.  6.  Wash thoroughly, paying special attention to the area where your surgery        will be performed.  7.  Thoroughly rinse your body with warm water from the neck down.  8.  DO NOT shower/wash with your normal soap after using and rinsing off       the CHG Soap.  9.  Pat yourself dry with a clean towel.            10.  Wear clean pajamas.            11.  Place clean sheets on your bed the night of your first shower and do not        sleep with pets.  Day of Surgery  Do not apply any lotions/deoderants the morning of surgery.  Please wear clean clothes to the hospital/surgery center.    Please read over the following fact sheets that you were given. Pain Booklet, MRSA Information and Surgical Site Infection Prevention

## 2016-05-12 NOTE — Progress Notes (Signed)
Spoke with Marcia,Rep with [employer]. Notified her of upcoming surgery on 05/16/16 @ 0730 with pt arrival of 0530.

## 2016-05-13 LAB — HEMOGLOBIN A1C
HEMOGLOBIN A1C: 5.6 % (ref 4.8–5.6)
MEAN PLASMA GLUCOSE: 114 mg/dL

## 2016-05-13 LAB — VAS US DOPPLER PRE CABG
LCCAPDIAS: 20 cm/s
LEFT ECA DIAS: -1 cm/s
LEFT VERTEBRAL DIAS: -8 cm/s
LICAPDIAS: -17 cm/s
Left CCA dist dias: -21 cm/s
Left CCA dist sys: -95 cm/s
Left CCA prox sys: 106 cm/s
Left ICA dist dias: -30 cm/s
Left ICA dist sys: -91 cm/s
Left ICA prox sys: -106 cm/s
RCCAPDIAS: 11 cm/s
RIGHT ECA DIAS: 0 cm/s
RIGHT VERTEBRAL DIAS: -9 cm/s
Right CCA prox sys: 67 cm/s
Right cca dist sys: -113 cm/s

## 2016-05-13 LAB — CUP PACEART REMOTE DEVICE CHECK: Date Time Interrogation Session: 20170829231029

## 2016-05-13 NOTE — Progress Notes (Signed)
Carelink summary report received. Battery status OK. Normal device function. No new symptom episodes, or pause episodes. No new AF episodes. Tachy/pause episodes previously reviewed.  Monthly summary reports and ROV/PRN

## 2016-05-15 ENCOUNTER — Ambulatory Visit (INDEPENDENT_AMBULATORY_CARE_PROVIDER_SITE_OTHER): Payer: Medicare HMO | Admitting: *Deleted

## 2016-05-15 ENCOUNTER — Encounter: Payer: Self-pay | Admitting: Internal Medicine

## 2016-05-15 DIAGNOSIS — Z950 Presence of cardiac pacemaker: Secondary | ICD-10-CM

## 2016-05-15 MED ORDER — POTASSIUM CHLORIDE 2 MEQ/ML IV SOLN
80.0000 meq | INTRAVENOUS | Status: DC
  Filled 2016-05-15: qty 40

## 2016-05-15 MED ORDER — INSULIN REGULAR HUMAN 100 UNIT/ML IJ SOLN
INTRAMUSCULAR | Status: DC
  Filled 2016-05-15: qty 2.5

## 2016-05-15 MED ORDER — DEXTROSE 5 % IV SOLN
1.5000 g | INTRAVENOUS | Status: AC
  Administered 2016-05-16: 1.5 g via INTRAVENOUS
  Filled 2016-05-15 (×2): qty 1.5

## 2016-05-15 MED ORDER — DEXMEDETOMIDINE HCL IN NACL 400 MCG/100ML IV SOLN
0.1000 ug/kg/h | INTRAVENOUS | Status: AC
  Administered 2016-05-16: .5 ug/kg/h via INTRAVENOUS
  Filled 2016-05-15: qty 100

## 2016-05-15 MED ORDER — DEXTROSE 5 % IV SOLN
0.0000 ug/min | INTRAVENOUS | Status: AC
  Administered 2016-05-16: 2 ug/min via INTRAVENOUS
  Filled 2016-05-15: qty 4

## 2016-05-15 MED ORDER — DOPAMINE-DEXTROSE 3.2-5 MG/ML-% IV SOLN
0.0000 ug/kg/min | INTRAVENOUS | Status: DC
Start: 2016-05-16 — End: 2016-05-16
  Filled 2016-05-15: qty 250

## 2016-05-15 MED ORDER — DEXTROSE 5 % IV SOLN
0.0000 ug/min | INTRAVENOUS | Status: DC
  Filled 2016-05-15: qty 4

## 2016-05-15 MED ORDER — SODIUM CHLORIDE 0.9 % IV SOLN
INTRAVENOUS | Status: DC
  Filled 2016-05-15: qty 30

## 2016-05-15 MED ORDER — MAGNESIUM SULFATE 50 % IJ SOLN
40.0000 meq | INTRAMUSCULAR | Status: DC
  Filled 2016-05-15: qty 10

## 2016-05-15 MED ORDER — PHENYLEPHRINE HCL 10 MG/ML IJ SOLN
30.0000 ug/min | INTRAVENOUS | Status: DC
  Filled 2016-05-15: qty 2

## 2016-05-15 MED ORDER — NITROGLYCERIN IN D5W 200-5 MCG/ML-% IV SOLN
2.0000 ug/min | INTRAVENOUS | Status: DC
  Filled 2016-05-15: qty 250

## 2016-05-15 MED ORDER — VANCOMYCIN HCL 10 G IV SOLR
1250.0000 mg | INTRAVENOUS | Status: DC
  Filled 2016-05-15 (×2): qty 1250

## 2016-05-15 NOTE — Progress Notes (Signed)
Wound re-check hematoma improving, soft when palpated.

## 2016-05-15 NOTE — Progress Notes (Signed)
Wound check CRT-P device check in clinic.  Steri-strips removed. Wound edges well approximated. Site edematous, soft to palpate, seen by JA. Wound without redness. RA and RV thresholds, sensing, impedance consistent with previous measurements. LV threshold (LV4-LV3) noted to be 4.50V @ 0.91ms. Vector express run by industry, LV1-LV2 threshold 2.00v @ 0.50ms. Vector express results reviewed by JA. Per JA programmed LV1 to LV2 @3 .00V 0.58ms. Device programmed at 3.5V for extra safety margin. Histograms appropriate for patient and level of activity. No mode switches or ventricular high rate episodes. Patient bi-ventricularly pacing 95% of the time. Device programmed with appropriate safety margins. Estimated longevity 6.5 years. Patient educated about wound care, arm mobility, lifting restrictions. TAVR planned for 9-26. ROV with AS 06-01-2016. JA 07-31-2016.

## 2016-05-15 NOTE — Anesthesia Preprocedure Evaluation (Addendum)
Anesthesia Evaluation  Patient identified by MRN, date of birth, ID band Patient awake    Reviewed: Allergy & Precautions, NPO status , Patient's Chart, lab work & pertinent test results  Airway Mallampati: II  TM Distance: >3 FB Neck ROM: Full    Dental  (+) Teeth Intact, Dental Advisory Given   Pulmonary shortness of breath,    breath sounds clear to auscultation + decreased breath sounds      Cardiovascular hypertension, + CAD, + Cardiac Stents, + CABG and + Peripheral Vascular Disease  + dysrhythmias  Rhythm:Regular + Systolic murmurs 9/1 TEE: Left ventricle: Mild LVH. Systolic function was mildly reduced. The estimated ejection fraction was in the range of 45% to 50%. Inferior and inferolateral hypokinesis. - Aortic valve: Sclerotic, rheumatic-appearing leaflets with severely reduced excursion. The base of the leaflets are calcified. There is severe stenosis. AVA is around 0.8-0.9 cm2. Peak and mean gradients of 29 and 47 mmHg. AVA by planimetry was 0.88 cm2. There was heavy aliasing of color doppler in the ascending aorta suggestive of severe aortic stenosis and high velocity jet. There is trivial aortic insufficiency. Mean gradient (S): 29 mm Hg. Peak gradient (S): 47 mm Hg. - Aorta: Grade 2 atheroma of the aortic arch. - Mitral valve: Mildly thickened leaflets . There was mild regurgitation. - Left atrium: The atrium was dilated. No evidence of thrombus in the atrial cavity or appendage. - Pulmonary veins: No anomaly. - Atrial septum: No defect or patent foramen ovale was identified. - Pulmonic valve: No evidence of vegetation.   Neuro/Psych negative neurological ROS     GI/Hepatic Neg liver ROS, GERD  ,  Endo/Other  Hypothyroidism   Renal/GU negative Renal ROS     Musculoskeletal  (+) Arthritis ,   Abdominal   Peds  Hematology  (+) anemia ,   Anesthesia Other Findings   Reproductive/Obstetrics                            Lab Results  Component Value Date   WBC 8.0 05/12/2016   HGB 10.0 (L) 05/12/2016   HCT 30.7 (L) 05/12/2016   MCV 96.8 05/12/2016   PLT 174 05/12/2016   Lab Results  Component Value Date   CREATININE 0.90 05/12/2016   BUN 21 (H) 05/12/2016   NA 134 (L) 05/12/2016   K 4.1 05/12/2016   CL 100 (L) 05/12/2016   CO2 24 05/12/2016    Anesthesia Physical Anesthesia Plan  ASA: IV  Anesthesia Plan: General   Post-op Pain Management:    Induction: Intravenous  Airway Management Planned: Oral ETT  Additional Equipment: Arterial line, CVP, Ultrasound Guidance Line Placement and TEE  Intra-op Plan:   Post-operative Plan: Extubation in OR  Informed Consent:   Plan Discussed with: Anesthesiologist and CRNA  Anesthesia Plan Comments:        Anesthesia Quick Evaluation

## 2016-05-16 ENCOUNTER — Inpatient Hospital Stay (HOSPITAL_COMMUNITY): Payer: Medicare HMO

## 2016-05-16 ENCOUNTER — Inpatient Hospital Stay (HOSPITAL_COMMUNITY): Payer: Medicare HMO | Admitting: Anesthesiology

## 2016-05-16 ENCOUNTER — Other Ambulatory Visit: Payer: Self-pay

## 2016-05-16 ENCOUNTER — Encounter (HOSPITAL_COMMUNITY): Payer: Self-pay | Admitting: General Practice

## 2016-05-16 ENCOUNTER — Encounter (HOSPITAL_COMMUNITY)
Admission: RE | Disposition: A | Discharge status: Home / Self Care | Payer: Self-pay | Source: Ambulatory Visit | Attending: Cardiovascular Disease

## 2016-05-16 ENCOUNTER — Inpatient Hospital Stay (HOSPITAL_COMMUNITY)
Admission: RE | Admit: 2016-05-16 | Discharge: 2016-05-17 | DRG: 267 | Disposition: A | Discharge status: Home / Self Care | Payer: Medicare HMO | Source: Ambulatory Visit | Attending: Cardiovascular Disease | Admitting: Cardiovascular Disease

## 2016-05-16 DIAGNOSIS — Z888 Allergy status to other drugs, medicaments and biological substances status: Secondary | ICD-10-CM

## 2016-05-16 DIAGNOSIS — Z006 Encounter for examination for normal comparison and control in clinical research program: Secondary | ICD-10-CM | POA: Diagnosis not present

## 2016-05-16 DIAGNOSIS — I679 Cerebrovascular disease, unspecified: Secondary | ICD-10-CM | POA: Diagnosis present

## 2016-05-16 DIAGNOSIS — Z9842 Cataract extraction status, left eye: Secondary | ICD-10-CM | POA: Diagnosis not present

## 2016-05-16 DIAGNOSIS — E039 Hypothyroidism, unspecified: Secondary | ICD-10-CM | POA: Diagnosis present

## 2016-05-16 DIAGNOSIS — I110 Hypertensive heart disease with heart failure: Secondary | ICD-10-CM | POA: Diagnosis not present

## 2016-05-16 DIAGNOSIS — I350 Nonrheumatic aortic (valve) stenosis: Secondary | ICD-10-CM

## 2016-05-16 DIAGNOSIS — I97790 Other intraoperative cardiac functional disturbances during cardiac surgery: Secondary | ICD-10-CM | POA: Diagnosis not present

## 2016-05-16 DIAGNOSIS — Z954 Presence of other heart-valve replacement: Secondary | ICD-10-CM | POA: Diagnosis not present

## 2016-05-16 DIAGNOSIS — K219 Gastro-esophageal reflux disease without esophagitis: Secondary | ICD-10-CM | POA: Diagnosis not present

## 2016-05-16 DIAGNOSIS — Z952 Presence of prosthetic heart valve: Secondary | ICD-10-CM

## 2016-05-16 DIAGNOSIS — K210 Gastro-esophageal reflux disease with esophagitis: Secondary | ICD-10-CM | POA: Diagnosis present

## 2016-05-16 DIAGNOSIS — Z951 Presence of aortocoronary bypass graft: Secondary | ICD-10-CM | POA: Diagnosis not present

## 2016-05-16 DIAGNOSIS — Z8249 Family history of ischemic heart disease and other diseases of the circulatory system: Secondary | ICD-10-CM | POA: Diagnosis not present

## 2016-05-16 DIAGNOSIS — J9811 Atelectasis: Secondary | ICD-10-CM | POA: Diagnosis not present

## 2016-05-16 DIAGNOSIS — I2510 Atherosclerotic heart disease of native coronary artery without angina pectoris: Secondary | ICD-10-CM | POA: Diagnosis present

## 2016-05-16 DIAGNOSIS — Z9841 Cataract extraction status, right eye: Secondary | ICD-10-CM

## 2016-05-16 DIAGNOSIS — D696 Thrombocytopenia, unspecified: Secondary | ICD-10-CM | POA: Diagnosis not present

## 2016-05-16 DIAGNOSIS — D62 Acute posthemorrhagic anemia: Secondary | ICD-10-CM | POA: Diagnosis not present

## 2016-05-16 DIAGNOSIS — I442 Atrioventricular block, complete: Secondary | ICD-10-CM | POA: Diagnosis present

## 2016-05-16 DIAGNOSIS — E785 Hyperlipidemia, unspecified: Secondary | ICD-10-CM | POA: Diagnosis present

## 2016-05-16 DIAGNOSIS — Z981 Arthrodesis status: Secondary | ICD-10-CM | POA: Diagnosis not present

## 2016-05-16 DIAGNOSIS — Z96652 Presence of left artificial knee joint: Secondary | ICD-10-CM | POA: Diagnosis present

## 2016-05-16 DIAGNOSIS — I509 Heart failure, unspecified: Secondary | ICD-10-CM | POA: Diagnosis not present

## 2016-05-16 DIAGNOSIS — I472 Ventricular tachycardia: Secondary | ICD-10-CM | POA: Diagnosis not present

## 2016-05-16 HISTORY — PX: TEE WITHOUT CARDIOVERSION: SHX5443

## 2016-05-16 HISTORY — PX: TRANSCATHETER AORTIC VALVE REPLACEMENT, TRANSFEMORAL: SHX6400

## 2016-05-16 LAB — CBC
HCT: 25.4 % — ABNORMAL LOW (ref 39.0–52.0)
Hemoglobin: 8.5 g/dL — ABNORMAL LOW (ref 13.0–17.0)
MCH: 32.2 pg (ref 26.0–34.0)
MCHC: 33.5 g/dL (ref 30.0–36.0)
MCV: 96.2 fL (ref 78.0–100.0)
PLATELETS: 106 10*3/uL — AB (ref 150–400)
RBC: 2.64 MIL/uL — AB (ref 4.22–5.81)
RDW: 15.4 % (ref 11.5–15.5)
WBC: 9.4 10*3/uL (ref 4.0–10.5)

## 2016-05-16 LAB — POCT I-STAT, CHEM 8
BUN: 20 mg/dL (ref 6–20)
BUN: 21 mg/dL — ABNORMAL HIGH (ref 6–20)
BUN: 21 mg/dL — ABNORMAL HIGH (ref 6–20)
CALCIUM ION: 1.18 mmol/L (ref 1.15–1.40)
CHLORIDE: 101 mmol/L (ref 101–111)
Calcium, Ion: 1.17 mmol/L (ref 1.15–1.40)
Calcium, Ion: 1.22 mmol/L (ref 1.15–1.40)
Chloride: 100 mmol/L — ABNORMAL LOW (ref 101–111)
Chloride: 101 mmol/L (ref 101–111)
Creatinine, Ser: 0.7 mg/dL (ref 0.61–1.24)
Creatinine, Ser: 0.7 mg/dL (ref 0.61–1.24)
Creatinine, Ser: 0.7 mg/dL (ref 0.61–1.24)
Glucose, Bld: 145 mg/dL — ABNORMAL HIGH (ref 65–99)
Glucose, Bld: 156 mg/dL — ABNORMAL HIGH (ref 65–99)
Glucose, Bld: 164 mg/dL — ABNORMAL HIGH (ref 65–99)
HCT: 26 % — ABNORMAL LOW (ref 39.0–52.0)
HCT: 24 % — ABNORMAL LOW (ref 39.0–52.0)
HCT: 26 % — ABNORMAL LOW (ref 39.0–52.0)
Hemoglobin: 8.8 g/dL — ABNORMAL LOW (ref 13.0–17.0)
Hemoglobin: 8.2 g/dL — ABNORMAL LOW (ref 13.0–17.0)
Hemoglobin: 8.8 g/dL — ABNORMAL LOW (ref 13.0–17.0)
Potassium: 3.6 mmol/L (ref 3.5–5.1)
Potassium: 3.7 mmol/L (ref 3.5–5.1)
Potassium: 3.8 mmol/L (ref 3.5–5.1)
SODIUM: 137 mmol/L (ref 135–145)
Sodium: 136 mmol/L (ref 135–145)
Sodium: 136 mmol/L (ref 135–145)
TCO2: 28 mmol/L (ref 0–100)
TCO2: 26 mmol/L (ref 0–100)
TCO2: 27 mmol/L (ref 0–100)

## 2016-05-16 LAB — POCT I-STAT 4, (NA,K, GLUC, HGB,HCT)
Glucose, Bld: 136 mg/dL — ABNORMAL HIGH (ref 65–99)
HEMATOCRIT: 24 % — AB (ref 39.0–52.0)
HEMOGLOBIN: 8.2 g/dL — AB (ref 13.0–17.0)
POTASSIUM: 3.6 mmol/L (ref 3.5–5.1)
SODIUM: 138 mmol/L (ref 135–145)

## 2016-05-16 LAB — POCT I-STAT 3, ART BLOOD GAS (G3+)
ACID-BASE EXCESS: 1 mmol/L (ref 0.0–2.0)
Acid-Base Excess: 3 mmol/L — ABNORMAL HIGH (ref 0.0–2.0)
Bicarbonate: 27.2 mmol/L (ref 20.0–28.0)
Bicarbonate: 27.5 mmol/L (ref 20.0–28.0)
O2 SAT: 95 %
O2 Saturation: 100 %
PCO2 ART: 48.8 mmHg — AB (ref 32.0–48.0)
PH ART: 7.356 (ref 7.350–7.450)
TCO2: 28 mmol/L (ref 0–100)
TCO2: 29 mmol/L (ref 0–100)
pCO2 arterial: 39.1 mmHg (ref 32.0–48.0)
pH, Arterial: 7.451 — ABNORMAL HIGH (ref 7.350–7.450)
pO2, Arterial: 412 mmHg — ABNORMAL HIGH (ref 83.0–108.0)
pO2, Arterial: 79 mmHg — ABNORMAL LOW (ref 83.0–108.0)

## 2016-05-16 LAB — PROTIME-INR
INR: 1.36
Prothrombin Time: 16.9 s — ABNORMAL HIGH (ref 11.4–15.2)

## 2016-05-16 LAB — APTT: aPTT: 45 s — ABNORMAL HIGH (ref 24–36)

## 2016-05-16 SURGERY — IMPLANTATION, AORTIC VALVE, TRANSCATHETER, FEMORAL APPROACH
Anesthesia: General | Site: Chest

## 2016-05-16 MED ORDER — PROPOFOL 10 MG/ML IV BOLUS
INTRAVENOUS | Status: DC | PRN
  Administered 2016-05-16: 150 mg via INTRAVENOUS

## 2016-05-16 MED ORDER — LEVOTHYROXINE SODIUM 112 MCG PO TABS
112.0000 ug | ORAL_TABLET | Freq: Every day | ORAL | Status: DC
  Administered 2016-05-17: 112 ug via ORAL
  Filled 2016-05-16: qty 1

## 2016-05-16 MED ORDER — LATANOPROST 0.005 % OP SOLN
1.0000 [drp] | Freq: Every day | OPHTHALMIC | Status: DC
  Administered 2016-05-16: 1 [drp] via OPHTHALMIC
  Filled 2016-05-16: qty 2.5

## 2016-05-16 MED ORDER — SUGAMMADEX SODIUM 200 MG/2ML IV SOLN
INTRAVENOUS | Status: DC | PRN
  Administered 2016-05-16: 164.6 mg via INTRAVENOUS

## 2016-05-16 MED ORDER — TAMSULOSIN HCL 0.4 MG PO CAPS
0.4000 mg | ORAL_CAPSULE | Freq: Every day | ORAL | Status: DC
  Administered 2016-05-16 – 2016-05-17 (×2): 0.4 mg via ORAL
  Filled 2016-05-16 (×2): qty 1

## 2016-05-16 MED ORDER — OXYCODONE HCL 5 MG PO TABS: 5.0000 mg | ORAL_TABLET | ORAL | Status: DC | PRN

## 2016-05-16 MED ORDER — ETOMIDATE 2 MG/ML IV SOLN
INTRAVENOUS | Status: AC
  Filled 2016-05-16: qty 10

## 2016-05-16 MED ORDER — ROCURONIUM BROMIDE 100 MG/10ML IV SOLN
INTRAVENOUS | Status: DC | PRN
  Administered 2016-05-16: 50 mg via INTRAVENOUS
  Administered 2016-05-16: 10 mg via INTRAVENOUS

## 2016-05-16 MED ORDER — SODIUM CHLORIDE 0.9 % IV SOLN
INTRAVENOUS | Status: DC | PRN
  Administered 2016-05-16: 08:00:00 via INTRAVENOUS
  Administered 2016-05-16: 1250 mg via INTRAVENOUS

## 2016-05-16 MED ORDER — ADULT MULTIVITAMIN W/MINERALS CH
1.0000 | ORAL_TABLET | Freq: Every day | ORAL | Status: DC
  Administered 2016-05-16 – 2016-05-17 (×3): 1 via ORAL
  Filled 2016-05-16 (×2): qty 1

## 2016-05-16 MED ORDER — PHENYLEPHRINE HCL 10 MG/ML IJ SOLN
0.0000 ug/min | INTRAVENOUS | Status: DC
  Filled 2016-05-16: qty 2

## 2016-05-16 MED ORDER — FAMOTIDINE IN NACL 20-0.9 MG/50ML-% IV SOLN
20.0000 mg | Freq: Two times a day (BID) | INTRAVENOUS | Status: DC
  Administered 2016-05-16: 20 mg via INTRAVENOUS
  Filled 2016-05-16: qty 50

## 2016-05-16 MED ORDER — HEPARIN SODIUM (PORCINE) 1000 UNIT/ML IJ SOLN
INTRAMUSCULAR | Status: DC | PRN
  Administered 2016-05-16: 9000 [IU] via INTRAVENOUS

## 2016-05-16 MED ORDER — PROPOFOL 10 MG/ML IV BOLUS
INTRAVENOUS | Status: AC
  Filled 2016-05-16: qty 20

## 2016-05-16 MED ORDER — SODIUM CHLORIDE 0.9% FLUSH: 3.0000 mL | INTRAVENOUS | Status: DC | PRN

## 2016-05-16 MED ORDER — METOPROLOL TARTRATE 5 MG/5ML IV SOLN
2.5000 mg | INTRAVENOUS | Status: DC | PRN
Start: 2016-05-16 — End: 2016-05-17

## 2016-05-16 MED ORDER — ASPIRIN EC 81 MG PO TBEC
81.0000 mg | DELAYED_RELEASE_TABLET | Freq: Every day | ORAL | Status: DC
  Administered 2016-05-16 – 2016-05-17 (×2): 81 mg via ORAL
  Filled 2016-05-16 (×2): qty 1

## 2016-05-16 MED ORDER — SODIUM CHLORIDE 0.9 % IV SOLN
INTRAVENOUS | Status: DC | PRN
  Administered 2016-05-16 (×3)

## 2016-05-16 MED ORDER — DEXTROSE 5 % IV SOLN
1.5000 g | Freq: Two times a day (BID) | INTRAVENOUS | Status: DC
  Administered 2016-05-16 – 2016-05-17 (×3): 1.5 g via INTRAVENOUS
  Filled 2016-05-16 (×4): qty 1.5

## 2016-05-16 MED ORDER — ALLOPURINOL 300 MG PO TABS
600.0000 mg | ORAL_TABLET | Freq: Every day | ORAL | Status: DC
  Administered 2016-05-16 – 2016-05-17 (×2): 600 mg via ORAL
  Filled 2016-05-16 (×2): qty 2

## 2016-05-16 MED ORDER — CHLORHEXIDINE GLUCONATE 0.12 % MT SOLN
15.0000 mL | OROMUCOSAL | Status: AC
  Administered 2016-05-16: 15 mL via OROMUCOSAL
  Filled 2016-05-16: qty 15

## 2016-05-16 MED ORDER — ATORVASTATIN CALCIUM 20 MG PO TABS
20.0000 mg | ORAL_TABLET | Freq: Every evening | ORAL | Status: DC
  Administered 2016-05-16 – 2016-05-17 (×2): 20 mg via ORAL
  Filled 2016-05-16 (×2): qty 1

## 2016-05-16 MED ORDER — SODIUM CHLORIDE 0.9 % IV SOLN
1.0000 mL/kg/h | INTRAVENOUS | Status: AC
  Administered 2016-05-16: 1 mL/kg/h via INTRAVENOUS

## 2016-05-16 MED ORDER — LACTATED RINGERS IV SOLN: 500.0000 mL | Freq: Once | INTRAVENOUS | Status: DC | PRN

## 2016-05-16 MED ORDER — MIDAZOLAM HCL 2 MG/2ML IJ SOLN
INTRAMUSCULAR | Status: DC | PRN
  Administered 2016-05-16: 0.5 mg via INTRAVENOUS

## 2016-05-16 MED ORDER — MIDAZOLAM HCL 2 MG/2ML IJ SOLN
INTRAMUSCULAR | Status: AC
  Filled 2016-05-16: qty 2

## 2016-05-16 MED ORDER — SODIUM CHLORIDE 0.9 % IV SOLN
250.0000 mL | INTRAVENOUS | Status: DC | PRN
  Administered 2016-05-16: 250 mL via INTRAVENOUS

## 2016-05-16 MED ORDER — PHENYLEPHRINE HCL 10 MG/ML IJ SOLN
INTRAVENOUS | Status: DC | PRN
  Administered 2016-05-16: 20 ug/min via INTRAVENOUS

## 2016-05-16 MED ORDER — CLOPIDOGREL BISULFATE 75 MG PO TABS
75.0000 mg | ORAL_TABLET | Freq: Every day | ORAL | Status: DC
  Administered 2016-05-17: 75 mg via ORAL
  Filled 2016-05-16: qty 1

## 2016-05-16 MED ORDER — NITROGLYCERIN IN D5W 200-5 MCG/ML-% IV SOLN: 0.0000 ug/min | INTRAVENOUS | Status: DC

## 2016-05-16 MED ORDER — PROTAMINE SULFATE 10 MG/ML IV SOLN
INTRAVENOUS | Status: DC | PRN
  Administered 2016-05-16 (×2): 20 mg via INTRAVENOUS
  Administered 2016-05-16: 10 mg via INTRAVENOUS
  Administered 2016-05-16 (×2): 20 mg via INTRAVENOUS

## 2016-05-16 MED ORDER — FENTANYL CITRATE (PF) 250 MCG/5ML IJ SOLN
INTRAMUSCULAR | Status: DC | PRN
  Administered 2016-05-16: 50 ug via INTRAVENOUS

## 2016-05-16 MED ORDER — TRAMADOL HCL 50 MG PO TABS: 50.0000 mg | ORAL_TABLET | ORAL | Status: DC | PRN

## 2016-05-16 MED ORDER — IODIXANOL 320 MG/ML IV SOLN
INTRAVENOUS | Status: DC | PRN
  Administered 2016-05-16: 40.8 mL via INTRAVENOUS

## 2016-05-16 MED ORDER — PANTOPRAZOLE SODIUM 40 MG PO TBEC: 40.0000 mg | DELAYED_RELEASE_TABLET | Freq: Every day | ORAL | Status: DC

## 2016-05-16 MED ORDER — ALBUMIN HUMAN 5 % IV SOLN
250.0000 mL | INTRAVENOUS | Status: DC | PRN
  Administered 2016-05-16: 250 mL via INTRAVENOUS
  Filled 2016-05-16: qty 250

## 2016-05-16 MED ORDER — LACTATED RINGERS IV SOLN
INTRAVENOUS | Status: DC | PRN
Start: 2016-05-16 — End: 2016-05-16
  Administered 2016-05-16 (×2): via INTRAVENOUS

## 2016-05-16 MED ORDER — FLUTICASONE PROPIONATE 50 MCG/ACT NA SUSP
1.0000 | Freq: Two times a day (BID) | NASAL | Status: DC
  Administered 2016-05-16 – 2016-05-17 (×2): 1 via NASAL
  Filled 2016-05-16: qty 16

## 2016-05-16 MED ORDER — LIDOCAINE HCL (CARDIAC) 20 MG/ML IV SOLN
INTRAVENOUS | Status: DC | PRN
  Administered 2016-05-16: 50 mg via INTRATRACHEAL

## 2016-05-16 MED ORDER — SODIUM CHLORIDE 0.9% FLUSH
3.0000 mL | Freq: Two times a day (BID) | INTRAVENOUS | Status: DC
  Administered 2016-05-16 – 2016-05-17 (×2): 3 mL via INTRAVENOUS

## 2016-05-16 MED ORDER — ONDANSETRON HCL 4 MG/2ML IJ SOLN
INTRAMUSCULAR | Status: DC | PRN
  Administered 2016-05-16: 4 mg via INTRAVENOUS

## 2016-05-16 MED ORDER — SODIUM CHLORIDE 0.9 % IV SOLN: INTRAVENOUS | Status: DC

## 2016-05-16 MED ORDER — POTASSIUM CHLORIDE 10 MEQ/50ML IV SOLN
10.0000 meq | INTRAVENOUS | Status: AC | PRN
  Administered 2016-05-16 (×3): 10 meq via INTRAVENOUS

## 2016-05-16 MED ORDER — ONDANSETRON HCL 4 MG/2ML IJ SOLN: 4.0000 mg | Freq: Four times a day (QID) | INTRAMUSCULAR | Status: DC | PRN

## 2016-05-16 MED ORDER — METOPROLOL SUCCINATE ER 50 MG PO TB24
50.0000 mg | ORAL_TABLET | Freq: Every day | ORAL | Status: DC
  Administered 2016-05-17: 50 mg via ORAL
  Filled 2016-05-16: qty 2

## 2016-05-16 MED ORDER — FENTANYL CITRATE (PF) 250 MCG/5ML IJ SOLN
INTRAMUSCULAR | Status: AC
  Filled 2016-05-16: qty 5

## 2016-05-16 MED ORDER — VANCOMYCIN HCL IN DEXTROSE 1-5 GM/200ML-% IV SOLN
1000.0000 mg | Freq: Once | INTRAVENOUS | Status: AC
  Administered 2016-05-16: 1000 mg via INTRAVENOUS
  Filled 2016-05-16: qty 200

## 2016-05-16 MED ORDER — MORPHINE SULFATE (PF) 2 MG/ML IV SOLN: 2.0000 mg | INTRAVENOUS | Status: DC | PRN

## 2016-05-16 MED FILL — Heparin Sodium (Porcine) Inj 1000 Unit/ML: INTRAMUSCULAR | Qty: 30 | Status: AC

## 2016-05-16 MED FILL — Magnesium Sulfate Inj 50%: INTRAMUSCULAR | Qty: 10 | Status: AC

## 2016-05-16 MED FILL — Potassium Chloride Inj 2 mEq/ML: INTRAVENOUS | Qty: 40 | Status: AC

## 2016-05-16 SURGICAL SUPPLY — 99 items
ADAPTER UNIV SWAN GANZ BIP (ADAPTER) ×2 IMPLANT
ADAPTER UNV SWAN GANZ BIP (ADAPTER) ×1
ATTRACTOMAT 16X20 MAGNETIC DRP (DRAPES) IMPLANT
BAG BANDED W/RUBBER/TAPE 36X54 (MISCELLANEOUS) ×3 IMPLANT
BAG DECANTER FOR FLEXI CONT (MISCELLANEOUS) IMPLANT
BAG SNAP BAND KOVER 36X36 (MISCELLANEOUS) ×6 IMPLANT
BLADE 10 SAFETY STRL DISP (BLADE) IMPLANT
BLADE STERNUM SYSTEM 6 (BLADE) ×3 IMPLANT
BLADE SURG ROTATE 9660 (MISCELLANEOUS) IMPLANT
CABLE PACING FASLOC BIEGE (MISCELLANEOUS) ×3 IMPLANT
CABLE PACING FASLOC BLUE (MISCELLANEOUS) ×3 IMPLANT
CANISTER SUCTION 2500CC (MISCELLANEOUS) IMPLANT
CANNULA FEM VENOUS REMOTE 22FR (CANNULA) IMPLANT
CANNULA OPTISITE PERFUSION 16F (CANNULA) IMPLANT
CANNULA OPTISITE PERFUSION 18F (CANNULA) IMPLANT
CATH DIAG EXPO 6F VENT PIG 145 (CATHETERS) ×6 IMPLANT
CATH EXPO 5FR AL1 (CATHETERS) ×3 IMPLANT
CATH S G BIP PACING (SET/KITS/TRAYS/PACK) ×6 IMPLANT
CLIP TI MEDIUM 24 (CLIP) IMPLANT
CLIP TI WIDE RED SMALL 24 (CLIP) IMPLANT
CONT SPEC 4OZ CLIKSEAL STRL BL (MISCELLANEOUS) ×18 IMPLANT
COVER DOME SNAP 22 D (MISCELLANEOUS) ×3 IMPLANT
COVER MAYO STAND STRL (DRAPES) ×3 IMPLANT
COVER TABLE BACK 60X90 (DRAPES) ×3 IMPLANT
CRADLE DONUT ADULT HEAD (MISCELLANEOUS) ×3 IMPLANT
DERMABOND ADVANCED (GAUZE/BANDAGES/DRESSINGS) ×1
DERMABOND ADVANCED .7 DNX12 (GAUZE/BANDAGES/DRESSINGS) ×2 IMPLANT
DEVICE CLOSURE PERCLS PRGLD 6F (VASCULAR PRODUCTS) ×4 IMPLANT
DRAPE INCISE IOBAN 66X45 STRL (DRAPES) IMPLANT
DRAPE SLUSH MACHINE 52X66 (DRAPES) ×3 IMPLANT
DRAPE TABLE COVER HEAVY DUTY (DRAPES) ×3 IMPLANT
DRSG TEGADERM 4X4.75 (GAUZE/BANDAGES/DRESSINGS) ×3 IMPLANT
ELECT REM PT RETURN 9FT ADLT (ELECTROSURGICAL) ×6
ELECTRODE REM PT RTRN 9FT ADLT (ELECTROSURGICAL) ×4 IMPLANT
FELT TEFLON 6X6 (MISCELLANEOUS) ×3 IMPLANT
FEMORAL VENOUS CANN RAP (CANNULA) IMPLANT
GAUZE SPONGE 4X4 12PLY STRL (GAUZE/BANDAGES/DRESSINGS) ×3 IMPLANT
GLOVE BIO SURGEON STRL SZ7.5 (GLOVE) ×3 IMPLANT
GLOVE BIO SURGEON STRL SZ8 (GLOVE) ×6 IMPLANT
GLOVE BIOGEL PI IND STRL 6.5 (GLOVE) ×4 IMPLANT
GLOVE BIOGEL PI INDICATOR 6.5 (GLOVE) ×2
GLOVE EUDERMIC 7 POWDERFREE (GLOVE) ×3 IMPLANT
GLOVE ORTHO TXT STRL SZ7.5 (GLOVE) ×3 IMPLANT
GOWN STRL REUS W/ TWL LRG LVL3 (GOWN DISPOSABLE) ×6 IMPLANT
GOWN STRL REUS W/ TWL XL LVL3 (GOWN DISPOSABLE) ×12 IMPLANT
GOWN STRL REUS W/TWL LRG LVL3 (GOWN DISPOSABLE) ×3
GOWN STRL REUS W/TWL XL LVL3 (GOWN DISPOSABLE) ×6
GUIDEWIRE SAF TJ AMPL .035X180 (WIRE) ×3 IMPLANT
GUIDEWIRE SAFE TJ AMPLATZ EXST (WIRE) ×3 IMPLANT
GUIDEWIRE STRAIGHT .035 260CM (WIRE) ×6 IMPLANT
INSERT FOGARTY 61MM (MISCELLANEOUS) IMPLANT
INSERT FOGARTY SM (MISCELLANEOUS) IMPLANT
INSERT FOGARTY XLG (MISCELLANEOUS) IMPLANT
KIT BASIN OR (CUSTOM PROCEDURE TRAY) ×3 IMPLANT
KIT DILATOR VASC 18G NDL (KITS) IMPLANT
KIT HEART LEFT (KITS) ×3 IMPLANT
KIT ROOM TURNOVER OR (KITS) ×3 IMPLANT
KIT SUCTION CATH 14FR (SUCTIONS) ×6 IMPLANT
NEEDLE PERC 18GX7CM (NEEDLE) ×3 IMPLANT
NS IRRIG 1000ML POUR BTL (IV SOLUTION) ×9 IMPLANT
PACK AORTA (CUSTOM PROCEDURE TRAY) ×3 IMPLANT
PAD ARMBOARD 7.5X6 YLW CONV (MISCELLANEOUS) ×6 IMPLANT
PAD ELECT DEFIB RADIOL ZOLL (MISCELLANEOUS) ×3 IMPLANT
PATCH TACHOSII LRG 9.5X4.8 (VASCULAR PRODUCTS) IMPLANT
PERCLOSE PROGLIDE 6F (VASCULAR PRODUCTS) ×6
SET MICROPUNCTURE 5F STIFF (MISCELLANEOUS) ×3 IMPLANT
SHEATH AVANTI 11CM 8FR (MISCELLANEOUS) ×3 IMPLANT
SHEATH PINNACLE 6F 10CM (SHEATH) ×6 IMPLANT
SLEEVE REPOSITIONING LENGTH 30 (MISCELLANEOUS) ×3 IMPLANT
SPONGE LAP 4X18 X RAY DECT (DISPOSABLE) ×3 IMPLANT
STOPCOCK MORSE 400PSI 3WAY (MISCELLANEOUS) ×6 IMPLANT
SUT ETHIBOND X763 2 0 SH 1 (SUTURE) ×3 IMPLANT
SUT GORETEX CV 4 TH 22 36 (SUTURE) IMPLANT
SUT GORETEX CV4 TH-18 (SUTURE) IMPLANT
SUT GORETEX TH-18 36 INCH (SUTURE) IMPLANT
SUT MNCRL AB 3-0 PS2 18 (SUTURE) IMPLANT
SUT PROLENE 3 0 SH1 36 (SUTURE) IMPLANT
SUT PROLENE 4 0 RB 1 (SUTURE)
SUT PROLENE 4-0 RB1 .5 CRCL 36 (SUTURE) IMPLANT
SUT PROLENE 5 0 C 1 36 (SUTURE) IMPLANT
SUT PROLENE 6 0 C 1 30 (SUTURE) IMPLANT
SUT SILK  1 MH (SUTURE) ×1
SUT SILK 1 MH (SUTURE) ×2 IMPLANT
SUT SILK 2 0 SH CR/8 (SUTURE) IMPLANT
SUT VIC AB 2-0 CT1 27 (SUTURE)
SUT VIC AB 2-0 CT1 TAPERPNT 27 (SUTURE) IMPLANT
SUT VIC AB 2-0 CTX 36 (SUTURE) IMPLANT
SUT VIC AB 3-0 SH 8-18 (SUTURE) IMPLANT
SYR 30ML LL (SYRINGE) ×6 IMPLANT
SYR 50ML LL SCALE MARK (SYRINGE) ×3 IMPLANT
SYRINGE 10CC LL (SYRINGE) ×9 IMPLANT
TOWEL OR 17X26 10 PK STRL BLUE (TOWEL DISPOSABLE) ×6 IMPLANT
TRANSDUCER W/STOPCOCK (MISCELLANEOUS) ×6 IMPLANT
TRAY FOLEY IC TEMP SENS 14FR (CATHETERS) ×3 IMPLANT
TUBE SUCT INTRACARD DLP 20F (MISCELLANEOUS) IMPLANT
TUBING HIGH PRESSURE 120CM (CONNECTOR) ×3 IMPLANT
VALVE HRT TRANSCATH CERT 29MM (Valve) ×3 IMPLANT
WIRE AMPLATZ SS-J .035X180CM (WIRE) ×3 IMPLANT
WIRE BENTSON .035X145CM (WIRE) ×3 IMPLANT

## 2016-05-16 NOTE — H&P (Signed)
[address]       [place] [address]             [phone]      Cardiothoracic Surgery History and Physical    Referring Provider is [name], MD PCP is [name], MD  Reason for admission: Severe, symptomatic aortic stenosis for TAVR  HPI:  The patient is an 82 year old gentleman with hypertension, hyperlipidemia, hypothyroidism, cerebrovascular disease s/p left carotid endarterectomy in 2009 and known coronary disease s/p CABG by me in 1995 and subsequent SVG PCI/stenting in 2010 and 10/2015. He has had known moderate aortic stenosis that has been followed and over the past year has developed exertional shortness of breath and fatigue. He has also had recurrent episodes of exertional dizziness and syncope. He says that he knows when he is going to have one of those episodes because he gets weak and feels like things are closing in one him. He has had a loop recorder with no documented arrhthymias. He has usually been able to sit down prior to passing out. He says that he probably had 5 syncopal episodes over the past couple years and his symptoms were not improved after his PCI in 10/2015.He had a repeat 2D echo on 03/29/2016 showing moderately thickened and calcified aortic valve leaflets with severely reduced leaflet separation. The mean gradient was 21 mm Hg with a peak of 36 mm Hg. The peak velocity ratio was 0.27. The LVEF was 50%. He had an ETT on 8/23 and reached 85% of his max heart rate with no symptoms. He stopped due to fatigue. He had a cardiac cath on 04/19/2016 showing severe native vessel disease with occlusion of the LM and RCA with continued patency of the LIMA to LAD, SVG to OM and SVG to the PDA with moderate in-stent restenosis in the PDA graft. Cardiac output was 3.93 with an index of 2.04 with a mean AV gradient of 24.5 mm Hg and an AVA of 0.82. He had what was felt to be an atrial tachycardia throughout the procedure to 110-120 bpm. Dr.  Burt Knack attempted to give him dobutamine to evaluate his gradient but this was not tolerated due to his tachycardia. He had a TEE on 9/1 showing a mean AV gradient of 29 mm Hg with an AVA by planimetry of 0.88 cm2. There was an LVEF of 45-50% with mild MR. The valve looked like a severely stenotic valve. He was set up to see me in the office for consideration of TAVR but was admitted to [facility] on 9/5 after a syncopal episode while pumping gas. He says that he felt bad all of the sudden and passed out hitting the concrete. He does not remember anything until he woke up. He sustained a bad contusion and laceration to the right forehead and abrasions to his extremities. He was transferred to [facility] for further evaluation and interrogation of his loop recorder showed complete heart block at the time of his syncope. He had a pacer implanted by EP.  The patient lives at home with his wife and is very active and independent. He feels like he has been limited recently due to fatigue and SOB with exertion and recurrent episodes of dizziness. He has had no chest pain or pressure.        Past Medical History:  Diagnosis Date  . Aortic stenosis    Mild, echo, August, 2012  . Arthritis   . Benign prostatic hypertrophy   .  CAD (coronary artery disease)    DES, SVG to circumflex marginal, October, 2010 ( SVG to PDA and PLA patent , LIMA to LAD patent, EF 60%, mild inferior hypokinesis  . Carotid artery disease ([place])    DES, SVG to circumflex marginal, October, 2010 ( SVG to PDA and PLA patent , LIMA to LAD patent, EF 60%, mild inferior hypokinesis; H/o mild CHF, CABG  . GERD (gastroesophageal reflux disease)    takes daily  . Head trauma 04/25/2016  . HTN (hypertension)    takes meds daily  . Hx of decompressive lumbar laminectomy    Dr.Botero December, 2011  . Hyperlipidemia   . Hypothyroidism   . Nephrolithiasis   . Neuromuscular disorder ([place])   . Psoriasis    severe; with total skin  exfoliation in the past resolved  . PVC's (premature ventricular contractions)    May, 2014  . Syncope    Remote, etiology unknown  . Ventricular tachycardia, non-sustained [facility])          Past Surgical History:  Procedure Laterality Date  . APPENDECTOMY    . BACK SURGERY     x3  . bilateral L3-4 laminectomies    . CARDIAC CATHETERIZATION N/A 11/17/2015   Procedure: Right/Left Heart Cath and Coronary Angiography;  Surgeon: Peter M Martinique, MD;  Location: [place] CV LAB;  Service: Cardiovascular;  Laterality: N/A;  . CARDIAC CATHETERIZATION N/A 11/17/2015   Procedure: Coronary Stent Intervention;  Surgeon: Peter M Martinique, MD;  Location: [place] [facility];  Service: Cardiovascular;  Laterality: N/A;  . CARDIAC CATHETERIZATION N/A 04/19/2016   Procedure: Right/Left Heart Cath and Coronary/Graft Angiography;  Surgeon: [name], MD;  Location: [place] [facility] LAB;  Service: Cardiovascular;  Laterality: N/A;  . Carelink Remote Monitoring  Oct. 29, 2015   by Dr. [name]  . CAROTID ENDARTERECTOMY Left 02-24-08   cea  Dr. Amedeo Plenty  . CARPAL TUNNEL RELEASE Bilateral 2010  . CATARACT EXTRACTION, BILATERAL    . COLONOSCOPY  11/23/2011   Procedure: COLONOSCOPY;  Surgeon: Rogene Houston, MD;  Location: [facility];  Service: Endoscopy;  Laterality: N/A;  730  . CORONARY ARTERY BYPASS GRAFT  1995  . CORONARY STENT PLACEMENT  11/17/2015   SVG   DES to RCA  . decompression of left median nerve    . EP IMPLANTABLE DEVICE N/A 04/27/2016   Procedure: Pacemaker Implant;  Surgeon: [name], MD;  Location: [place] [facility] LAB;  Service: Cardiovascular;  Laterality: N/A;  . JOINT REPLACEMENT Left Sept. 27, 2013   Knee  . left carotid endarectomy and Dacron patch angioplasty]    . LEFT HEART CATHETERIZATION WITH CORONARY ANGIOGRAM N/A 12/27/2012   Procedure: LEFT HEART CATHETERIZATION WITH CORONARY ANGIOGRAM;  Surgeon: Burnell Blanks, MD;  Location: [facility]  CATH LAB;  Service: Cardiovascular;  Laterality: N/A;  . posterior arthrodesis with autograft and allograft    . removal of synovial cyst    . SPINE SURGERY     X's 3  . TEE WITHOUT CARDIOVERSION N/A 04/21/2016   Procedure: TRANSESOPHAGEAL ECHOCARDIOGRAM (TEE);  Surgeon: Pixie Casino, MD;  Location: [facility] ENDOSCOPY;  Service: Cardiovascular;  Laterality: N/A;  . TONSILLECTOMY    . TOTAL KNEE ARTHROPLASTY  04/16/2012   Procedure: TOTAL KNEE ARTHROPLASTY;  Surgeon: Garald Balding, MD;  Location: [place];  Service: Orthopedics;  Laterality: Left;          Family History  Problem Relation Age of Onset  . Heart attack  Mother   . Heart disease Mother     Heart Disease before age 93  . Heart disease Father     Heart Disease before age 27  . Hypertension Father   . Hyperlipidemia Father   . Heart attack Son 34  . Colon cancer Neg Hx   Father had aortic stenosis and aortic valve replacement.  Social History        Social History  . Marital status: Married    Spouse name: N/A  . Number of children: 2  . Years of education: 16       Occupational History  . Retired          Social History Main Topics  . Smoking status: Never Smoker  . Smokeless tobacco: Never Used  . Alcohol use No     Comment: One beer per week.  . Drug use: No  . Sexual activity: Not Currently       Other Topics Concern  . Not on file      Social History Narrative   Lives at home with wife.   1 cup coffee per day.   Right-handed.             Current Facility-Administered Medications  Medication Dose Route Frequency Provider Last Rate Last Dose  . 0.9 %  sodium chloride infusion  250 mL Intravenous PRN [name], MD      . acetaminophen (TYLENOL) tablet 325-650 mg  325-650 mg Oral Q4H PRN [name], MD      . allopurinol (ZYLOPRIM) tablet 600 mg  600 mg Oral Daily Isaiah Serge, NP   600 mg at 04/28/16 0836  . aspirin EC tablet 81 mg  81 mg Oral Daily  Isaiah Serge, NP   81 mg at 04/28/16 0836  . atorvastatin (LIPITOR) tablet 20 mg  20 mg Oral QPM Isaiah Serge, NP   20 mg at 04/27/16 1817  . ferrous sulfate tablet   Oral Daily Isaiah Serge, NP   325 mg at 04/28/16 [id number]  . HYDROcodone-acetaminophen (NORCO/VICODIN) 5-325 MG per tablet 1-2 tablet  1-2 tablet Oral Q4H PRN [name], MD      . irbesartan (AVAPRO) tablet 75 mg  75 mg Oral Daily Isaiah Serge, NP   75 mg at 04/27/16 1620  . latanoprost (XALATAN) 0.005 % ophthalmic solution 1 drop  1 drop Both Eyes QHS Isaiah Serge, NP   1 drop at 04/27/16 2131  . levothyroxine (SYNTHROID, LEVOTHROID) tablet 112 mcg  112 mcg Oral QAC breakfast Isaiah Serge, NP   112 mcg at 04/28/16 [id number]  . metoprolol succinate (TOPROL-XL) 24 hr tablet 50 mg  50 mg Oral Daily [name], MD   50 mg at 04/27/16 1621  . multivitamin with minerals tablet   Oral Daily Isaiah Serge, NP   1 tablet at 04/28/16 [phone]  . nitroGLYCERIN (NITROSTAT) SL tablet 0.4 mg  0.4 mg Sublingual Q5 Min x 3 PRN Isaiah Serge, NP      . ondansetron [facility] injection 4 mg  4 mg Intravenous Q6H PRN [name], MD      . psyllium (HYDROCIL/METAMUCIL) packet 1 packet  1 packet Oral Daily Patsey Berthold, NP   1 packet at 04/28/16 [phone]  . pyridOXINE (VITAMIN B-6) tablet 100 mg  100 mg Oral Daily Isaiah Serge, NP   100 mg at 04/28/16 0834  . sodium chloride flush (NS) 0.9 % injection 3 mL  3 mL Intravenous Q12H [name], MD   3 mL at 04/28/16 0840  . sodium chloride flush (NS) 0.9 % injection 3 mL  3 mL Intravenous PRN [name], MD      . tamsulosin (FLOMAX) capsule 0.4 mg  0.4 mg Oral Daily Isaiah Serge, NP   0.4 mg at 04/28/16 [id number]        Allergies  Allergen Reactions  . Propylene Glycol Hives and Rash      Review of Systems:              General:                      normal appetite, decreased energy, intermittent weight gain due to fluid, no weight loss, no fever             Cardiac:                        no chest pain with exertion, no chest pain at rest, moderate SOB with mild exertion, no resting SOB, no PND, no orthopnea, no palpitations, no arrhythmia, no atrial fibrillation, has LE edema, occasional dizzy spells, recent syncope             Respiratory:                 exertional shortness of breath, no home oxygen, no productive cough, no dry cough, no bronchitis, no wheezing, no hemoptysis, no asthma, no pain with inspiration or cough, no sleep apnea, no CPAP at night             GI:                               no difficulty swallowing, no reflux, no frequent heartburn, no hiatal hernia, no abdominal pain, no constipation, no diarrhea, no hematochezia, no hematemesis, no melena             GU:                              no dysuria,  no frequency, no urinary tract infection, no hematuria, no enlarged prostate, no kidney stones, no kidney disease             Vascular:                     no pain suggestive of claudication, no pain in feet, no leg cramps, no varicose veins, no DVT, no non-healing foot ulcer             Neuro:                         no stroke, no TIA's, no seizures, no headaches, no temporary blindness one eye,  no slurred speech, has peripheral neuropathy in lower legs, no chronic pain, no instability of gait, no memory/cognitive dysfunction             Musculoskeletal:         no arthritis, no joint swelling, no myalgias, no difficulty walking, no mobility              Skin:                            occasional  rash, no itching, no skin infections, no pressure sores or ulcerations             Psych:                         no anxiety, no depression, no nervousness, no unusual recent stress             Eyes:                           no blurry vision, no floaters, no recent vision changes,  wears glasses or contacts             ENT:                            no hearing loss, no loose or painful teeth, no dentures, last saw dentist few months ago. Sees him every 6 months. No  recent problems             Hematologic:               has easy bruising, no abnormal bleeding, no clotting disorder, no frequent epistaxis             Endocrine:                   no diabetes, does not check CBG's at home                                                       Physical Exam:              BP 98/61   Pulse 67   Temp 98.4 F (36.9 C) (Oral)   Resp 18   Ht 5\' 8"  (1.727 m)   Wt 79.7 kg (175 lb 11.3 oz)   SpO2 97%   BMI 26.72 kg/m              General:                      Elderly but  well-appearing, contusion and ecchymosis around right eye and forehead             HEENT:                       Unremarkable , PERLA, EOMI, oropharynx clear. Teeth in fair conditon             Neck:                           no JVD, no adenopathy, transmitted murmur to both sides of neck             Chest:                          clear to auscultation, symmetrical breath sounds, no wheezes, no rhonchi              CV:                              RRR,  grade III/VI crescendo/decrescendo murmur heard best at RSB,  no diastolic murmur             Abdomen:                    soft, non-tender, no masses or organomegaly             Extremities:                 warm, well-perfused, pulses palpable dp bilat, mild LE edema, right leg SV harvest scars             Rectal/GU                   Deferred             Neuro:                         Grossly non-focal and symmetrical throughout             Skin:                            Clean and dry, no rashes, no breakdown, abrasions on all extremities from recent fall   Diagnostic Tests:  *[facility]* [address], [place], [place] [address] [phone]  ------------------------------------------------------------------- Transthoracic Echocardiography  Patient: Huston, Dittus MR #: [id number] Study Date: 03/29/2016 Gender: M Age:  61 Height: 172.7 cm Weight: 81.6 kg BSA: 2 m^2 Pt. Status: Room:  SONOGRAPHER [facility] ATTENDING Kate Sable, MD [place], MD [place], MD PERFORMING [facility]  cc:  ------------------------------------------------------------------- LV EF: 50% - 55%  ------------------------------------------------------------------- History: PMH: Tachycardia and syncope. Coronary artery disease. Aortic valve disease. Risk factors: Hypertension. Dyslipidemia.  ------------------------------------------------------------------- Study Conclusions  - Left ventricle: The cavity size was normal. Wall thickness was increased in a pattern of mild LVH. Systolic function was low normal. The estimated ejection fraction was 50%. Features are consistent with a pseudonormal left ventricular filling pattern, with concomitant abnormal relaxation and increased filling pressure (grade 2 diastolic dysfunction). Doppler parameters are consistent with high ventricular filling pressure. - Regional wall motion abnormality: Hypokinesis of the mid inferior and mid inferolateral myocardium. - Aortic valve: Moderately thickened, moderately calcified leaflets. Cusp separation was severely reduced. There was moderate stenosis. There was mild regurgitation. Peak velocity (S): 302 cm/s. Mean gradient (S): 21 mm Hg. Valve area (VTI): 1.47 cm^2. Valve area (Vmax): 1.23 cm^2. Valve area (Vmean): 1.22 cm^2. - Mitral valve: Calcified annulus. There was moderate regurgitation. - Left atrium: The atrium was moderately dilated. - Right ventricle: The cavity size was mildly dilated. Systolic function was mildly reduced. - Tricuspid valve: Mildly thickened leaflets. There was mild regurgitation.  ------------------------------------------------------------------- Labs, prior tests,  procedures, and surgery: Echocardiography (March 2017). EF was 50%. Aortic valve: peak gradient of 40 mm Hg.  Coronary artery bypass grafting.  ------------------------------------------------------------------- Study data: Comparison was made to the study of March 2017. Study status: Routine. Procedure: The patient reported no pain pre or post test. Transthoracic echocardiography. Image quality was adequate. Study completion: There were no complications. Transthoracic echocardiography. M-mode, complete 2D, spectral Doppler, and color Doppler. Birthdate: Patient birthdate: 1931-04-07. Age: Patient is 82 yr old. Sex: Gender: male. BMI: 27.4 kg/m^2. Blood pressure: 126/67 Patient status: Outpatient. Study date: Study date: 03/29/2016. Study time: 08:57 AM.  -------------------------------------------------------------------  ------------------------------------------------------------------- Left ventricle: The cavity size  was normal. Wall thickness was increased in a pattern of mild LVH. Systolic function was low normal. The estimated ejection fraction was 50%. Regional wall motion abnormalities: Hypokinesis of the mid inferior and mid inferolateral myocardium. Features are consistent with a pseudonormal left ventricular filling pattern, with concomitant abnormal relaxation and increased filling pressure (grade 2 diastolic dysfunction). Doppler parameters are consistent with high ventricular filling pressure.  ------------------------------------------------------------------- Aortic valve: Moderately thickened, moderately calcified leaflets. Cusp separation was severely reduced. Doppler: There was moderate stenosis. There was mild regurgitation. VTI ratio of LVOT to aortic valve: 0.33. Valve area (VTI): 1.47 cm^2. Indexed valve area (VTI): 0.74 cm^2/m^2. Peak velocity ratio of LVOT to aortic valve: 0.27. Valve area (Vmax): 1.23 cm^2.  Indexed valve area (Vmax): 0.62 cm^2/m^2. Mean velocity ratio of LVOT to aortic valve: 0.27. Valve area (Vmean): 1.22 cm^2. Indexed valve area (Vmean): 0.61 cm^2/m^2. Mean gradient (S): 21 mm Hg. Peak gradient (S): 36 mm Hg.  ------------------------------------------------------------------- Aorta: Aortic root: The aortic root was normal in size. Ascending aorta: The ascending aorta was normal in size.  ------------------------------------------------------------------- Mitral valve: Calcified annulus. Doppler: There was moderate regurgitation. Peak gradient (D): 9 mm Hg.  ------------------------------------------------------------------- Left atrium: The atrium was moderately dilated.  ------------------------------------------------------------------- Atrial septum: No defect or patent foramen ovale was identified.  ------------------------------------------------------------------- Right ventricle: The cavity size was mildly dilated. Systolic function was mildly reduced.  ------------------------------------------------------------------- Pulmonic valve: The valve appears to be grossly normal. Doppler: There was trivial regurgitation.  ------------------------------------------------------------------- Tricuspid valve: Mildly thickened leaflets. Doppler: There was mild regurgitation.  ------------------------------------------------------------------- Right atrium: The atrium was normal in size.  ------------------------------------------------------------------- Pericardium: There was no pericardial effusion.  ------------------------------------------------------------------- Systemic veins: Inferior vena cava: The vessel was normal in size. The respirophasic diameter changes were in the normal range (>= 50%), consistent with normal central venous  pressure.  ------------------------------------------------------------------- Measurements  Left ventricle Value Reference LV ID, ED, PLAX chordal (H) 53.6 mm 43 - 52 LV ID, ES, PLAX chordal (H) 46.7 mm 23 - 38 LV fx shortening, PLAX chordal (L) 13 % >=29 LV PW thickness, ED 10.5 mm --------- IVS/LV PW ratio, ED 0.99 <=1.3 Stroke volume, 2D 99 ml --------- Stroke volume/bsa, 2D 50 ml/m^2 --------- LV ejection fraction, 1-p A4C 50 % --------- LV end-diastolic volume, 2-p [id number] --------- LV end-systolic volume, 2-p 59 ml --------- LV ejection fraction, 2-p 49 % --------- Stroke volume, 2-p 56 ml --------- LV end-diastolic volume/bsa, 2-p 58 ml/m^2 --------- LV end-systolic volume/bsa, 2-p 29 ml/m^2 --------- Stroke volume/bsa, 2-p 28.2 ml/m^2 --------- LV e&', lateral 8.61 cm/s --------- LV E/e&', lateral 17.19 --------- LV e&', medial 6.58 cm/s --------- LV E/e&', medial 22.49 --------- LV e&', average 7.6 cm/s --------- LV E/e&', average 19.49 ---------  Ventricular septum Value Reference IVS thickness, ED 10.4 mm ---------  LVOT Value Reference LVOT ID, S 24 mm --------- LVOT area 4.52 cm^2  --------- LVOT peak velocity, S 82.1 cm/s --------- LVOT mean velocity, S 57.8 cm/s --------- LVOT VTI, S 21.9 cm ---------  Aortic valve Value Reference Aortic valve peak velocity, S 302 cm/s --------- Aortic valve mean velocity, S 215 cm/s --------- Aortic valve VTI, S 67.2 cm --------- Aortic mean gradient, S 21 mm Hg --------- Aortic peak gradient, S 36 mm Hg --------- VTI ratio, LVOT/AV 0.33 --------- Aortic valve area, VTI 1.47 cm^2 --------- Aortic valve area/bsa, VTI 0.74 cm^2/m^2 --------- Velocity ratio, peak, LVOT/AV 0.27 --------- Aortic valve area, peak velocity 1.23 cm^2 --------- Aortic valve area/bsa, peak 0.62 cm^2/m^2 --------- velocity Velocity ratio, mean,  LVOT/AV 0.27 --------- Aortic valve area, mean velocity 1.22 cm^2 --------- Aortic valve area/bsa, mean 0.61 cm^2/m^2 --------- velocity Aortic regurg pressure half-time 770 ms ---------  Aorta Value Reference Aortic root ID, ED 32 mm ---------  Left atrium Value Reference LA ID, A-P, ES 43 mm --------- LA ID/bsa, A-P 2.15 cm/m^2 <=2.2 LA volume, S 65.7 ml --------- LA volume/bsa, S 32.9 ml/m^2 --------- LA volume, ES, 1-p A4C 73.3 ml --------- LA volume/bsa, ES, 1-p A4C  36.7 ml/m^2 --------- LA volume, ES, 1-p A2C 58.6 ml --------- LA volume/bsa, ES, 1-p A2C 29.4 ml/m^2 ---------  Mitral valve Value Reference Mitral E-wave peak velocity 148 cm/s --------- Mitral A-wave peak velocity 107 cm/s --------- Mitral deceleration time 156 ms 150 - 230 Mitral peak gradient, D 9 mm Hg --------- Mitral E/A ratio, peak 1.4 --------- Mitral regurg VTI, PISA 169 cm --------- Mitral ERO, PISA 0.07 cm^2 --------- Mitral regurg volume, PISA 12 ml ---------  Pulmonary arteries Value Reference PA pressure, S, DP 22 mm Hg <=30  Tricuspid valve Value Reference Tricuspid regurg peak velocity 220 cm/s --------- Tricuspid peak RV-RA gradient 19 mm Hg ---------  Systemic veins Value Reference Estimated CVP 3 mm Hg ---------  Right ventricle Value Reference RV pressure, S, DP 22 mm Hg <=30  Pulmonic valve Value Reference Pulmonic regurg velocity, ED 138 cm/s --------- Pulmonic regurg gradient, ED 8 mm Hg ---------  Legend: (L) and (H) mark values outside specified reference range.  ------------------------------------------------------------------- Prepared and Electronically Authenticated by  Kate Sable, MD 2017-08-09T11:09:21  *[facility]* [address],  [address] [phone]  ------------------------------------------------------------------- Transesophageal Echocardiography  Patient: Eisen, Silverman MR #: [id number] Study Date: 04/21/2016 Gender: M Age: 44 Height: 172.7 cm Weight: 79.4 kg BSA: 1.97 m^2 Pt. Status: Room:  ORDERING [name], MD REFERRING [name], MD [place] MD [place] MD PERFORMING Lyman Bishop MD SONOGRAPHER Madelin Rear, RDCS  cc:  ------------------------------------------------------------------- LV EF: 45% - 50%  ------------------------------------------------------------------- Indications: Aortic stenosis 424.1.  ------------------------------------------------------------------- History: PMH: Syncope. Coronary artery disease. Aortic valve disease. Risk factors: Hypertension.  ------------------------------------------------------------------- Study Conclusions  - Left ventricle: Mild LVH. Systolic function was mildly reduced. The estimated ejection fraction was in the range of 45% to 50%. Inferior and inferolateral hypokinesis. - Aortic valve: Sclerotic, rheumatic-appearing leaflets with severely reduced excursion. The base of the leaflets are calcified. There is severe stenosis. AVA is around 0.8-0.9 cm2. Peak and mean gradients of 29 and 47 mmHg. AVA by planimetry was 0.88 cm2. There was heavy aliasing of color doppler in the ascending aorta suggestive of severe aortic stenosis and high velocity jet. There is trivial aortic insufficiency. Mean gradient (S): 29 mm Hg. Peak gradient (S): 47 mm Hg. - Aorta: Grade 2 atheroma of the aortic arch. - Mitral valve: Mildly thickened leaflets . There was mild regurgitation. - Left atrium:  The atrium was dilated. No evidence of thrombus in the atrial cavity or appendage. - Pulmonary veins: No anomaly. - Atrial septum: No defect or patent foramen ovale was identified. - Pulmonic valve: No evidence of vegetation.  Impressions:  - Severe aortic stenosis - rheumatic appearing valve. AVA around 0.8-0.9 cm2. Trivial AI. Mild mitral regurgitation. LVEF 45-50% with inferior and inferolateral hypokinesis.  ------------------------------------------------------------------- Study data: Study status: Routine. Consent: The risks, benefits, and alternatives to the procedure were explained to the patient and informed consent was obtained. Procedure: The patient reported no pain pre or post test. Initial setup. The patient was  brought to the laboratory. Surface ECG leads were monitored. Sedation. Deep sedation was administered by anesthesiology staff. Sedation was reversed at the end of the procedure. Transesophageal echocardiography. An adult multiplane transesophageal probe was inserted by the attending cardiologistwithout difficulty. Image quality was adequate. Intravenous contrast (agitated saline) was administered. Study completion: The patient tolerated the procedure well. There were no complications. Administered medications: Propofol, 60mg , IV. Diagnostic transesophageal echocardiography. 2D and color Doppler. Birthdate: Patient birthdate: 1930-11-06. Age: Patient is 82 yr old. Sex: Gender: male. BMI: 26.6 kg/m^2. Blood pressure: 97/69 Patient status: Inpatient. Study date: Study date: 04/21/2016. Study time: 12:00 PM. Location: Endoscopy.  -------------------------------------------------------------------  ------------------------------------------------------------------- Left ventricle: Mild LVH. Systolic function was mildly reduced. The estimated ejection fraction was in the range of 45% to 50%. Inferior and  inferolateral hypokinesis.  ------------------------------------------------------------------- Aortic valve: Sclerotic, rheumatic-appearing leaflets with severely reduced excursion. The base of the leaflets are calcified. There is severe stenosis. AVA is around 0.8-0.9 cm2. Peak and mean gradients of 29 and 47 mmHg. AVA by planimetry was 0.88 cm2. There was heavy aliasing of color doppler in the ascending aorta suggestive of severe aortic stenosis and high velocity jet. There is trivial aortic insufficiency. Doppler: Mean gradient (S): 29 mm Hg. Peak gradient (S): 47 mm Hg.  ------------------------------------------------------------------- Aorta: Grade 2 atheroma of the aortic arch.  ------------------------------------------------------------------- Mitral valve: Mildly thickened leaflets . Doppler: There was mild regurgitation.  ------------------------------------------------------------------- Left atrium: The atrium was dilated. No evidence of thrombus in the atrial cavity or appendage.  ------------------------------------------------------------------- Atrial septum: No defect or patent foramen ovale was identified.  ------------------------------------------------------------------- Pulmonary veins: No anomaly.  ------------------------------------------------------------------- Right ventricle: The cavity size was normal. Wall thickness was normal. Systolic function was normal.  ------------------------------------------------------------------- Pulmonic valve: Structurally normal valve. Cusp separation was normal. No evidence of vegetation. Doppler: There was trivial regurgitation.  ------------------------------------------------------------------- Tricuspid valve: Doppler: There was trivial regurgitation.  ------------------------------------------------------------------- Pulmonary artery: The main pulmonary artery was  normal-sized.  ------------------------------------------------------------------- Right atrium: The atrium was normal in size.  ------------------------------------------------------------------- Pericardium: There was no pericardial effusion.  ------------------------------------------------------------------- Post procedure conclusions Ascending Aorta:  - Grade 2 atheroma of the aortic arch.  ------------------------------------------------------------------- Measurements  LVOT Value LVOT ID, S 20 mm LVOT area 3.14 cm^2  Aortic valve Value Aortic valve peak velocity, S 343 cm/s Aortic valve mean velocity, S 258 cm/s Aortic valve VTI, S 83.7 cm Aortic mean gradient, S 29 mm Hg Aortic peak gradient, S 47 mm Hg  Legend: (L) and (H) mark values outside specified reference range.  ------------------------------------------------------------------- Prepared and Electronically Authenticated by  Lyman Bishop MD 2017-09-01T13:20:39  [name], MD (Primary)    Procedures   Right/Left Heart Cath and Coronary/Graft Angiography  Conclusion   1. Severe native vessel coronary artery disease with total occlusion of the left main coronary artery and right coronary artery 2. Status post CABG with continued patency of the LIMA to LAD, saphenous vein graft to OM, and saphenous vein graft to PDA with moderate in-stent restenosis in the PDA graft 3. Moderate to severe aortic stenosis with aortic valve calculations as outlined below 4. Large V waves raising suspicion for significant mitral regurgitation 5. Patient with narrow complex tachycardia heart rate approximately 110 120 bpm throughout the procedure, complicating hemodynamic assessment  Complicated case and patient who has coronary  artery disease with moderate in-stent restenosis of the saphenous vein graft RCA, probable severe aortic stenosis, and conduction disease with frequent PVCs and possibly atrial tachycardia during his catheterization. Will review his case with the  multidisciplinary heart team including electrophysiology. I think it TEE would add important information about his valvular heart disease. He may have significant mitral regurgitation in addition to his aortic stenosis. After TEE would refer him to cardiac surgery for evaluation. If he has functional mitral regurgitation and his arrhythmia does not require further treatment, he may benefit from TAVR.  Indications   Severe aortic stenosis [I35.0 (ICD-10-CM)]  Procedural Details/Technique   Technical Details INDICATION: Aortic stenosis, possible severe low gradient aortic stenosis. Known CAD with hx CABG and PCI of saphenous vein graft disease.   PROCEDURAL DETAILS: The right groin was prepped draped and anesthetized with 1% lidocaine and the right femoral artery is accessed with 4 Fr sheath using the Modified Seldinger technique. There was an indwelling IV in a right antecubital vein. Using normal sterile technique, the IV was changed out for a 5 Fr brachial sheath over a 0.018 inch wire. The left wrist was then prepped, draped, and anesthetized with 1% lidocaine. Using the modified Seldinger technique a 5/6 French Slender sheath was placed in the left radial artery. Intra-arterial verapamil was administered through the left radial artery sheath. IV heparin was administered after a JR4 catheter was advanced into the central aorta. A Swan-Ganz catheter was used for the right heart catheterization. Standard protocol was followed for recording of right heart pressures and sampling of oxygen saturations. Fick cardiac output was calculated. Standard Judkins catheters were used for selective coronary angiography, LIMA angiography, and SVG angiography. A pigtail is  advanced through the 4 Fr RFA sheath into the central aorta. The aortic valve is crossed with an AL-1 catheter and a J-wire. Simultaneous pressures are recorded with pigtail catheters in the LV and central AO. Dobutamine is infused to assess for low-gradient aortic stenosis. This was discontinued prematurely because of frequent PVC's, bigeminy, and tachycardia (HR 120 bpm prior to dobutamine infusion). There were no immediate procedural complications. The patient was transferred to the post catheterization recovery area for further monitoring.  During this procedure the patient is administered a total of Versed 1 mg and Fentanyl 25 mg to achieve and maintain moderate conscious sedation. The patient's heart rate, blood pressure, and oxygen saturation are monitored continuously during the procedure. The period of conscious sedation is 68 minutes, of which I was present face-to-face 100% of this time.   Estimated blood loss <50 mL. .    Coronary Findings   Dominance: Right  Left Main  Ost LM lesion, 100% stenosed. The lesion is calcified. The left coronary artery is not selectively injected. It is known to be totally occluded.  Right Coronary Artery  Prox RCA lesion, 100% stenosed. The RCA is totally occluded. This is a chronic occlusion.  Graft Angiography  saphenous Graft to RPDA  SVG graft was visualized by angiography. There is at least moderate stenosis in the stented segment of the saphenous vein graft to PDA. In some views the stenosis appears 50% and other views it appears as much is 70%.  Origin to Prox Graft lesion, 70% stenosed. The lesion was previously treated using a drug eluting stent between 6-12 months ago.  saphenous Graft to 1st Mrg  SVG graft was visualized by angiography.  Prox Graft to Mid Graft lesion, 20% stenosed. The lesion was previously treated. The saphenous vein graft OM is patent. The stented segment in the proximal body the graft is patent with about 20-25%  restenosis. This graft also supplies a second OM branch via collateral flow.  Free LIMA Graft to Mid  LAD  LIMA graft was visualized by angiography. The LIMA to LAD graft is widely patent without stenosis. The LAD beyond the graft is patent without significant stenosis. The diagonal fills retrograde from graft flow.  Right Heart   Right Heart Pressures Hemodynamic findings consistent with aortic valve stenosis. Elevated LV EDP consistent with volume overload. There are large V waves and the pulmonary capillary wedge tracing. Pulmonary artery pressures are mildly increased. LVEDP is mildly increased.    Left Heart   Aortic Valve There is severe aortic valve stenosis. The aortic valve is calcified. There is restricted aortic valve motion. Baseline aortic valve calculations show a peak to peak gradient of 26 mmHg, mean gradient 22 mmHg, cardiac output 6.7 L/m, and calculated aortic valve area of 1.25 cm.  A second calculation was done after IV dobutamine infusion was started at 5 mcg/kg/m and this demonstrates a peak to peak gradient of 26 mmHg, mean gradient 25 mmHg, cardiac output of 3.9 L/m, and calculated aortic valve area of 0.82 cm    Coronary Diagrams   Diagnostic Diagram     Implants        No implant documentation for this case.  PACS Images   Show images for Cardiac catheterization   Link to Procedure Log   Procedure Log    Hemo Data   Flowsheet Row Most Recent Value  Fick Cardiac Output 3.93 L/min  Fick Cardiac Output Index 2.04 (L/min)/BSA  Aortic Mean Gradient 24.5 mmHg  Aortic Peak Gradient 26 mmHg  Aortic Valve Area 0.82  Aortic Value Area Index 0.43 cm2/BSA  RA A Wave 5 mmHg  RA V Wave 7 mmHg  RA Mean 4 mmHg  RV Systolic Pressure 37 mmHg  RV Diastolic Pressure 0 mmHg  RV EDP 3 mmHg  PA Systolic Pressure 40 mmHg  PA Diastolic Pressure 14 mmHg  PA Mean 27 mmHg  PW A Wave 19 mmHg  PW V Wave 36 mmHg  PW Mean 17 mmHg  AO Systolic Pressure [id number] mmHg   AO Diastolic Pressure 50 mmHg  AO Mean 71 mmHg  LV Systolic Pressure [id number] mmHg  LV Diastolic Pressure 9 mmHg  LV EDP 20 mmHg  QP/QS 1  TPVR Index 7.75 HRUI  TSVR Index 34.83 HRUI  PVR SVR Ratio 0.12  TPVR/TSVR Ratio 0.31   ADDENDUM REPORT: 04/27/2016 13:01  CLINICAL DATA: Aortic stenosis  EXAM: Cardiac TAVR CT  TECHNIQUE: The patient was scanned on a Philips 256 scanner. A 120 kV retrospective scan was triggered in the descending thoracic aorta at 111 HU's. Gantry rotation speed was 270 msecs and collimation was .9 mm. No beta blockade or nitro were given. The 3D data set was reconstructed in 5% intervals of the R-R cycle. Systolic and diastolic phases were analyzed on a dedicated work station using MPR, MIP and VRT modes. The patient received 80 cc of contrast.  FINDINGS: Aortic Valve: Calcified trileaflet valve moderate calcification of the STJ  Aorta: Moderate calcification of root, arch and descending thoracic aorta including the take off of the left subclavian  Sinotubular Junction: 28 mm  Ascending Thoracic Aorta: 30 mm  Aortic Arch: 28 mm  Descending Thoracic Aorta: 26 mm  Sinus of Valsalva Measurements:  Non-coronary: 34 mm  Right -coronary: 33 mm  Left -coronary: 36 mm  Coronary Artery Height above Annulus:  Left Main: 16 mm  Right Coronary: 19 mm  Native LM and RCA occluded but patent SVG to OM, SVG to PDA and patent LIMA to  LAD  Virtual Basal Annulus Measurements:  Maximum/Minimum Diameter: 25 mm x 33.5 mm  Perimeter: 96 mm  Area: 658 mm2  Coronary Arteries: Sufficient height above annulus but occluded with patent grafts as described above  Optimum Fluoroscopic Angle for Delivery: LAO 9 degrees  IMPRESSION: 1) Calcified trileaflet aortic valve suitable for a 29 mm Sapien 3 valve  2) Occluded RCA/LM with patent grafts to OM, PDA and LIMA to LAD  3) Optimum angiographic angle for delivery  LAO 9 degrees  Jenkins Rouge   Electronically Signed By: Jenkins Rouge M.D. On: 04/27/2016 13:01   Addended by Josue Hector, MD on 04/27/2016 1:03 PM    Study Result   EXAM: OVER-READ INTERPRETATION CT CHEST  The following report is an over-read performed by radiologist Dr. Rebekah Chesterfield [facility] on 04/26/2016. This over-read does not include interpretation of cardiac or coronary anatomy or pathology. The coronary calcium score/coronary CTA interpretation by the cardiologist is attached.  COMPARISON: Chest CT 12/11/2012.  FINDINGS: Extracardiac findings will be dictated with contemporaneously obtained CTA of the chest, abdomen and pelvis 05/06/2016.  IMPRESSION: See separate dictation for CTA of chest, abdomen and pelvis for full description of relevant extracardiac findings.  Electronically Signed: By: Vinnie Langton M.D. On: 04/26/2016 15:10      CLINICAL DATA: 82 year old male with history of severe aortic stenosis. Preprocedural study prior to potential transcatheter aortic valve replacement (TAVR) procedure.  EXAM: CT ANGIOGRAPHY CHEST, ABDOMEN AND PELVIS  TECHNIQUE: Multidetector CT imaging through the chest, abdomen and pelvis was performed using the standard protocol during bolus administration of intravenous contrast. Multiplanar reconstructed images and MIPs were obtained and reviewed to evaluate the vascular anatomy.  CONTRAST: 70 mL of Isovue 370.  COMPARISON: Chest CT 12/11/2012.  FINDINGS: CTA CHEST FINDINGS  Mediastinum/Lymph Nodes: Heart size is borderline enlarged. There is no significant pericardial fluid, thickening or pericardial calcification. There is aortic atherosclerosis, as well as atherosclerosis of the great vessels of the mediastinum and the coronary arteries, including calcified atherosclerotic plaque in the left main, left anterior descending, left circumflex and  right coronary arteries. Status post median sternotomy for CABG, including LIMA to the LAD. Thickening calcification of the aortic valve (severe). No pathologically enlarged mediastinal or hilar lymph nodes. Circumferential thickening of the distal esophagus. No axillary lymphadenopathy.  Lungs/Pleura: Linear areas of architectural distortion in the lower lobes of the lungs bilaterally (left greater than right), most compatible with areas of chronic post infectious or inflammatory scarring, slightly increased compared to prior study from 12/11/2012. There is an 8 mm pulmonary nodule in the left lower lobe (image 49 of series 407) which is unchanged compared to prior study from 12/11/2012, considered definitively benign. No other suspicious appearing pulmonary nodules or masses are noted. No acute consolidative airspace disease. No pleural effusions.  Musculoskeletal/Soft Tissues: Median sternotomy wires. Orthopedic fixation hardware in the lower cervical spine. There are no aggressive appearing lytic or blastic lesions noted in the visualized portions of the skeleton.  CTA ABDOMEN AND PELVIS FINDINGS  Hepatobiliary: Liver has a slightly irregular contour, which could indicate early changes of cirrhosis. No discrete cystic or solid hepatic lesion. No intra or extrahepatic biliary ductal dilatation. Gallbladder is normal in appearance.  Pancreas: No pancreatic mass. No pancreatic ductal dilatation. No pancreatic or peripancreatic fluid or inflammatory changes.  Spleen: Unremarkable.  Adrenals/Urinary Tract: Mild bilateral renal atrophy and multifocal cortical thinning. No suspicious renal lesions. Several sub cm low-attenuation lesions are noted in the kidneys bilaterally, too small  to definitively characterize, but statistically likely to represent tiny cysts. No hydroureteronephrosis. Urinary bladder is normal in appearance. Bilateral adrenal glands are normal  in appearance.  Stomach/Bowel: Normal appearance of the stomach. No pathologic dilatation of small bowel or colon. The appendix is not confidently identified and may be surgically absent. Regardless, there are no inflammatory changes noted adjacent to the cecum to suggest the presence of an acute appendicitis at this time.  Vascular/Lymphatic: Aortic atherosclerosis, without evidence of aneurysm or dissection in the abdominal or pelvic vasculature. Vascular findings and measurements pertinent to potential TAVR procedure, as detailed below. 2 left-sided renal arteries appear widely patent, as does a single right-sided renal artery. Celiac axis, superior mesenteric artery and inferior mesenteric artery all appear widely patent, without definite hemodynamically significant stenosis. No lymphadenopathy noted in the abdomen or pelvis.  Reproductive: Prostate gland and seminal vesicles are unremarkable in appearance.  Other: No significant volume of ascites. No pneumoperitoneum.  Musculoskeletal: Postoperative changes of laminectomy at L3-L4. 1.3 x 2.2 cm fatty attenuation lesion in the left proximal vastus lateralis muscle, presumably a small intramuscular lipoma. There are no aggressive appearing lytic or blastic lesions noted in the visualized portions of the skeleton.  VASCULAR MEASUREMENTS PERTINENT TO TAVR:  AORTA:  Minimal Aortic Diameter - 14 x 12 mm  Severity of Aortic Calcification - severe  RIGHT PELVIS:  Right Common Iliac Artery -  Minimal Diameter - 7.5 x 7.2 mm  Tortuosity - mild  Calcification - moderate to severe  Right External Iliac Artery -  Minimal Diameter - 8.8 x 6.7 mm  Tortuosity - moderate to severe  Calcification - mild  Right Common Femoral Artery -  Minimal Diameter - 6.1 x 5.9 mm  Tortuosity - moderate  Calcification - mild  LEFT PELVIS:  Left Common Iliac Artery -  Minimal Diameter - 7.9 x 7.1  mm  Tortuosity - mild  Calcification - moderate to severe  Left External Iliac Artery -  Minimal Diameter - 7.6 x 6.9 mm  Tortuosity - moderate to severe  Calcification - mild  Left Common Femoral Artery -  Minimal Diameter - 8.1 x 5.7 mm  Tortuosity - mild  Calcification - moderate  Review of the MIP images confirms the above findings.  IMPRESSION: 1. Vascular findings and measurements pertinent to potential TAVR procedure, as detailed above. This patient appears to have suitable pelvic arterial access bilaterally. 2. Severe thickening calcification of the aortic valve, compatible with the reported clinical history of severe aortic stenosis. 3. Marked thickening of the distal third of the esophagus. This may simply reflect chronic changes of reflux esophagitis. However, if there is any clinical concern for Barrett's metaplasia or esophageal neoplasia, correlation with endoscopy should be considered. 4. Aortic atherosclerosis, in addition to left main and 3 vessel coronary artery disease. Status post median sternotomy for CABG, including LIMA to the LAD. 5. Additional incidental findings, as above.   Electronically Signed By: Vinnie Langton M.D. On: 04/26/2016 16:06   RISK SCORES About the STS Risk Calculator Procedure: AV Replacement + CAB  Risk of Mortality: 8.486%  Morbidity or Mortality: 37.352%  Long Length of Stay: 20.838%  Short Length of Stay: 11.919%  Permanent Stroke: 3.42%  Prolonged Ventilation: 29.209%  DSW Infection: 0.78%  Renal Failure: 10.962%  Reoperation: 13.542%   Impression:  This 82 year old gentleman has severe, symptomatic aortic stenosis with NYHA class III symptoms of shortness of breath and fatigue with mild exertion. He has also had recurrent dizziness and  syncope with a recent episode occurring during an episode of complete heart block noted on his implanted loop recorder. I have personally reviewed and  interpreted his echo, TEE, cath and CTA studies. He has a trileaflet aortic valve with sclerotic and thickened leaflet with mild calcification that are poorly mobile. While his mean gradient is only 29 mm Hg by TEE his valve area is 0.88 by planimetry. His LVEF is reduced to 45-50% and I think that he actually does have severe aortic stenosis. His cath shows patent grafts with moderate in-stent restenosis of the proximal portion of the PDA graft but he is not having any chest pain or pressure. I think his recent syncopal episode was due to a combination of complete heart block and severe AS. 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 This vein graft stenosis could be dealt with at a later date.  I think aortic valve replacement is indicated in this active 82 year old who is limited by exertional SOB and fatigue. I think he would be a high risk candidate for open surgical AVR but TAVR would be a good alternative for him with much less risk. His cardiac CT shows anatomy favorable for a Sapien 3 valve and his abdominal and pelvic CT show adequate pelvic arterial anatomy for transfemoral access.   The patient was counseled at length regarding treatment alternatives for management of severe symptomatic aortic stenosis. The risks and benefits of surgical intervention has been discussed in detail. Long-term prognosis with medical therapy was discussed. Alternative approaches such as conventional surgical aortic valve replacement, transcatheter aortic valve replacement, and palliative medical therapy were compared and contrasted at length. This discussion was placed in the context of the patient's own specific clinical presentation and past medical history. All of his questions been addressed. The patient is eager to proceed with surgical management as soon as possible. He will need to  have a second surgical evaluation. Assuming that after that evaluation we decide to proceed with transcatheter aortic valve replacement, a discussion was held regarding what types of management strategies would be attempted intraoperatively in the event of life-threatening complications, including whether or not the patient would be considered a candidate for the use of cardiopulmonary bypass and/or conversion to open sternotomy for attempted surgical intervention. The patient is aware of the fact that transient use of cardiopulmonary bypass may be necessary, but the patient specifically states that she would not wish to undergo redo median sternotomy under any circumstances, even if she were to develop potentially lethal complications related to transcatheter valves appointment.   The patient has been advised of a variety of complications that might develop including but not limited to risks of death, stroke, paravalvular leak, aortic dissection or other major vascular complications, aortic annulus rupture, device embolization, cardiac rupture or perforation, mitral regurgitation, acute myocardial infarction, arrhythmia, heart block or bradycardia requiring permanent pacemaker placement, congestive heart failure, respiratory failure, renal failure, pneumonia, infection, other late complications related to structural valve deterioration or migration, or other complications that might ultimately cause a temporary or permanent loss of functional independence or other long term morbidity. The patient provides full informed consent for the procedure as described and all questions were answered.   Plan:  Transfemoral TAVR    Gaye Pollack, MD

## 2016-05-16 NOTE — Op Note (Signed)
[facility]   MULTIDISCIPLINARY HEART VALVE TEAM   TAVR OPERATIVE NOTE   Date of Procedure:  05/16/2016  Preoperative Diagnosis: Severe Aortic Stenosis   Postoperative Diagnosis: Same   Procedure:   Transcatheter Aortic Valve Replacement - Percutaneous Transfemoral Approach  Edwards Sapien 3 THV (size 29 mm, model # 9600TFX, serial # [id number])   Co-Surgeons:  Gilford Raid, MD and [name], MD  Anesthesiologist:  Roberts Gaudy, MD  Echocardiographer:  Jenkins Rouge, MD  Pre-operative Echo Findings:  Severe aortic stenosis  Normal left ventricular systolic function  Post-operative Echo Findings:  No paravalvular leak  unchanged left ventricular systolic function  BRIEF CLINICAL NOTE AND INDICATIONS FOR SURGERY The patient is an 82 year old gentleman with hypertension, hyperlipidemia, hypothyroidism, cerebrovascular disease s/p left carotid endarterectomy in 2009 and known coronary disease s/p CABG by Dr Cyndia Bent in 1995 and subsequent SVG PCI/stenting in 2010 and 10/2015. He has had known moderate aortic stenosis that has been followed and over the past year has developed exertional shortness of breath and fatigue. He has also had recurrent episodes of exertional dizziness and syncope. He has had 5 syncopal episodes over the past couple years and his symptoms were not improved after his PCI in 10/2015 .He had a repeat 2D echo on 03/29/2016 showing moderately thickened and calcified aortic valve leaflets with severely reduced leaflet separation. The mean gradient was 21 mm Hg with a peak of 36 mm Hg. The peak velocity ratio was 0.27. The LVEF was 50%. He had an ETT on 8/23 and reached 85% of his max heart rate with no symptoms. He stopped due to fatigue. He had a cardiac cath on 04/19/2016 showing severe native vessel disease with occlusion of the LM and RCA with continued patency of the LIMA to LAD, SVG to OM and SVG to the PDA with moderate in-stent restenosis in the  PDA graft. Cardiac output was 3.93 with an index of 2.04 with a mean AV gradient of 24.5 mm Hg and an AVA of 0.82. He had a TEE on 9/1 showing a mean AV gradient of 29 mm Hg with an AVA by planimetry of 0.88 cm2. There was an LVEF of 45-50% with mild MR. He was evaluated by Dr Roxy Manns and Dr Cyndia Bent who agreed the patient has severe symptomatic aortic stenosis and he presents today for TAVR.  During the course of the patient's preoperative work up they have been evaluated comprehensively by a multidisciplinary team of specialists coordinated through the [facility] in the [facility].  They have been demonstrated to suffer from symptomatic severe aortic stenosis as noted above. The patient has been counseled extensively as to the relative risks and benefits of all options for the treatment of severe aortic stenosis including long term medical therapy, conventional surgery for aortic valve replacement, and transcatheter aortic valve replacement.  The patient has been independently evaluated by two cardiac surgeons and they are felt to be at high risk for conventional surgical aortic valve replacement based upon a predicted risk of mortality using the Society of Thor[employer] Surgeons risk calculator of 8.5%. Based upon review of all of the patient's preoperative diagnostic tests they are felt to be candidate for transcatheter aortic valve replacement using the transfemoral approach as an alternative to high risk conventional surgery.    Following the decision to proceed with transcatheter aortic valve replacement, a discussion has been held regarding what types of management strategies would be attempted intraoperatively  in the event of life-threatening complications, including whether or not the patient would be considered a candidate for the use of cardiopulmonary bypass and/or conversion to open sternotomy for attempted surgical intervention.  The patient has been  advised of a variety of complications that might develop peculiar to this approach including but not limited to risks of death, stroke, paravalvular leak, aortic dissection or other major vascular complications, aortic annulus rupture, device embolization, cardiac rupture or perforation, acute myocardial infarction, arrhythmia, heart block or bradycardia requiring permanent pacemaker placement, congestive heart failure, respiratory failure, renal failure, pneumonia, infection, other late complications related to structural valve deterioration or migration, or other complications that might ultimately cause a temporary or permanent loss of functional independence or other long term morbidity.  The patient provides full informed consent for the procedure as described and all questions were answered preoperatively.    DETAILS OF THE OPERATIVE PROCEDURE  PREPARATION:    The patient is brought to the operating room on the above mentioned date and central monitoring was established by the anesthesia team including placement of Swan-Ganz catheter and radial arterial line. The patient is placed in the supine position on the operating table.  Intravenous antibiotics are administered.  General endotracheal anesthesia is induced uneventfully.  A Foley catheter is placed.  Baseline transesophageal echocardiogram was performed. The patient's chest, abdomen, both groins, and both lower extremities are prepared and draped in a sterile manner. A time out procedure is performed.   PERIPHERAL ACCESS:    Using the modified Seldinger technique, femoral arterial and venous access was obtained with placement of 6 Fr sheaths on the left side.  A pigtail diagnostic catheter was passed through the left femoral arterial sheath under fluoroscopic guidance into the aortic root.  A temporary transvenous pacemaker catheter was passed through the left femoral venous sheath under fluoroscopic guidance into the right ventricle.  The  pacemaker was tested to ensure stable lead placement and pacemaker capture. Aortic root angiography was performed in order to determine the optimal angiographic angle for valve deployment.   TRANSFEMORAL ACCESS:  A micropuncture technique is used to access the right femoral artery under fluoroscopic guidance.   2 Perclose devices are deployed at 10' and 2' positions to 'PreClose' the femoral artery. An 8 French sheath is placed and then an Amplatz Superstiff wire is advanced through the sheath. This is changed out for a 16 French transfemoral E-Sheath after progressively dilating over the Superstiff wire.  An AL-2 catheter was used to direct a straight-tip exchange length wire across the native aortic valve into the left ventricle. When the wire went into the LV the patient developed sustained VT and required electrical cardioversion x 1. He otherwise was hemodynamically stable throughout the procedure. This catheter was exchanged out for a pigtail catheter and position was confirmed in the LV apex. Simultaneous LV and Ao pressures were recorded.  The pigtail catheter was exchanged for an Amplatz Extra-stiff wire in the LV apex.  Echocardiography was utilized to confirm appropriate wire position and no sign of entanglement in the mitral subvalvular apparatus.   TRANSCATHETER HEART VALVE DEPLOYMENT:  An Edwards Sapien 3 transcatheter heart valve (size 29 mm, model #9600TFX, serial [id number]) was prepared and crimped per manufacturer's guidelines, and the proper orientation of the valve is confirmed on the [employer] delivery system. The valve was advanced through the introducer sheath using normal technique until in an appropriate position in the abdominal aorta beyond the sheath tip. The balloon was then retracted and  using the fine-tuning wheel was centered on the valve. The valve was then advanced across the aortic arch using appropriate flexion of the catheter. The valve was carefully positioned  across the aortic valve annulus. The Commander catheter was retracted using normal technique. Once final position of the valve has been confirmed by angiographic assessment, the valve is deployed while temporarily holding ventilation and during rapid ventricular pacing to maintain systolic blood pressure < 50 mmHg and pulse pressure < 10 mmHg. The balloon inflation is held for >3 seconds after reaching full deployment volume. Once the balloon has fully deflated the balloon is retracted into the ascending aorta and valve function is assessed using echocardiography. There is felt to be no paravalvular leak and no central aortic insufficiency.  The patient's hemodynamic recovery following valve deployment is good.  The deployment balloon and guidewire are both removed. Echo demostrated acceptable post-procedural gradients, stable mitral valve function, and no aortic insufficiency.    PROCEDURE COMPLETION:  The sheath was removed and femoral artery closure is performed using the 2 previously deployed Perclose devices.   Complete hemostasis was obtained. Protamine was administered.  The temporary pacemaker, pigtail catheters and femoral sheaths were removed with manual pressure used for hemostasis.   The patient tolerated the procedure well and is transported to the surgical intensive care in stable condition. There were no immediate intraoperative complications. All sponge instrument and needle counts are verified correct at completion of the operation.   The patient received a total of 40 mL of intravenous contrast during the procedure.   [name], MD 05/16/2016 1:33 PM

## 2016-05-16 NOTE — Interval H&P Note (Signed)
History and Physical Interval Note:  05/16/2016 5:45 AM  [name]  has presented today for surgery, with the diagnosis of SEVERE AS  The various methods of treatment have been discussed with the patient and family. After consideration of risks, benefits and other options for treatment, the patient has consented to  Procedure(s): TRANSCATHETER AORTIC VALVE REPLACEMENT, TRANSFEMORAL (N/A) TRANSESOPHAGEAL ECHOCARDIOGRAM (TEE) (N/A) as a surgical intervention .  The patient's history has been reviewed, patient examined, no change in status, stable for surgery.  I have reviewed the patient's chart and labs.  Questions were answered to the patient's satisfaction.     Gaye Pollack

## 2016-05-16 NOTE — Op Note (Signed)
[facility]   MULTIDISCIPLINARY HEART VALVE TEAM   TAVR OPERATIVE NOTE   Date of Procedure:  05/16/2016  Preoperative Diagnosis: Severe Aortic Stenosis   Postoperative Diagnosis: Same   Procedure:    Transcatheter Aortic Valve Replacement - Percutaneous Right Transfemoral Approach  Edwards Sapien 3 THV (size 29 mm, model # 9600TFX, serial # [id number])   Co-Surgeons:  Gaye Pollack, MD and [name], MD    Anesthesiologist:  Roberts Gaudy, MD  Echocardiographer:  Jenkins Rouge, MD  Pre-operative Echo Findings:   severe aortic stenosis   normal left ventricular systolic function   Post-operative Echo Findings:  No paravalvular leak  normal left ventricular systolic function    BRIEF CLINICAL NOTE AND INDICATIONS FOR SURGERY  The patient is an 82 year old gentleman with hypertension, hyperlipidemia, hypothyroidism, cerebrovascular disease s/p left carotid endarterectomy in 2009 and known coronary disease s/p CABG by me in 1995 and subsequent SVG PCI/stenting in 2010 and 10/2015. He has had known moderate aortic stenosis that has been followed and over the past year has developed exertional shortness of breath and fatigue. He has also had recurrent episodes of exertional dizziness and syncope. He says that he knows when he is going to have one of those episodes because he gets weak and feels like things are closing in one him. He has had a loop recorder with no documented arrhthymias. He has usually been able to sit down prior to passing out. He says that he probably had 5 syncopal episodes over the past couple years and his symptoms were not improved after his PCI in 10/2015.He had a repeat 2D echo on 03/29/2016 showing moderately thickened and calcified aortic valve leaflets with severely reduced leaflet separation. The mean gradient was 21 mm Hg with a peak of 36 mm Hg. The peak velocity ratio was 0.27. The LVEF was 50%. He had an ETT on 8/23 and reached 85%  of his max heart rate with no symptoms. He stopped due to fatigue. He had a cardiac cath on 04/19/2016 showing severe native vessel disease with occlusion of the LM and RCA with continued patency of the LIMA to LAD, SVG to OM and SVG to the PDA with moderate in-stent restenosis in the PDA graft. Cardiac output was 3.93 with an index of 2.04 with a mean AV gradient of 24.5 mm Hg and an AVA of 0.82. He had what was felt to be an atrial tachycardia throughout the procedure to 110-120 bpm. Dr. Burt Knack attempted to give him dobutamine to evaluate his gradient but this was not tolerated due to his tachycardia. He had a TEE on 9/1 showing a mean AV gradient of 29 mm Hg with an AVA by planimetry of 0.88 cm2. There was an LVEF of 45-50% with mild MR. The valve looked like a severely stenotic valve. He was set up to see me in the office for consideration of TAVR but was admitted to [facility] on 9/5 after a syncopal episode while pumping gas. He says that he felt bad all of the sudden and passed out hitting the concrete. He does not remember anything until he woke up. He sustained a bad contusion and laceration to the right forehead and abrasions to his extremities. He was transferred to [facility] for further evaluation and interrogation of his loop recorder showed complete heart block at the time of his syncope. He had a pacer implanted by EP.  This 82 year old gentleman has severe, symptomatic aortic stenosis with NYHA  class III symptoms of shortness of breath and fatigue with mild exertion. He has also had recurrent dizziness and syncope with a recent episode occurring during an episode of complete heart block noted on his implanted loop recorder. I have personally reviewed and interpreted his echo, TEE, cath and CTA studies. He has a trileaflet aortic valve with sclerotic and thickened leaflet with mild calcification that are poorly mobile. While his mean gradient is only 29 mm Hg by TEE his valve area is 0.88 by planimetry. His LVEF is  reduced to 45-50% and I think that he actually does have severe aortic stenosis. His cath shows patent grafts with moderate in-stent restenosis of the proximal portion of the PDA graft but he is not having any chest pain or pressure. I think his recent syncopal episode was due to a combination of complete heart block and severe AS. 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 This vein graft stenosis could be dealt with at a later date.  I think aortic valve replacement is indicated in this active 82 year old who is limited by exertional SOB and fatigue. I think he would be a high risk candidate for open surgical AVR but TAVR would be a good alternative for him with much less risk. His cardiac CT shows anatomy favorable for a Sapien 3 valve and his abdominal and pelvic CT show adequate pelvic arterial anatomy for transfemoral access.     Following the decision to proceed with transcatheter aortic valve replacement, a discussion has been held regarding what types of management strategies would be attempted intraoperatively in the event of life-threatening complications, including whether or not the patient would be considered a candidate for the use of cardiopulmonary bypass and/or conversion to open sternotomy for attempted surgical intervention.  The patient has been advised of a variety of complications that might develop peculiar to this approach including but not limited to risks of death, stroke, paravalvular leak, aortic dissection or other major vascular complications, aortic annulus rupture, device embolization, cardiac rupture or perforation, acute myocardial infarction, arrhythmia, heart block or bradycardia requiring permanent pacemaker placement, congestive heart failure, respiratory failure, renal failure, pneumonia, infection, other late complications related to  structural valve deterioration or migration, or other complications that might ultimately cause a temporary or permanent loss of functional independence or other long term morbidity.  The patient provides full informed consent for the procedure as described and all questions were answered preoperatively.    DETAILS OF THE OPERATIVE PROCEDURE  PREPARATION:    The patient is brought to the operating room on the above mentioned date and central monitoring was established by the anesthesia team including placement of Swan-Ganz catheter and radial arterial line. The patient is placed in the supine position on the operating table.  Intravenous antibiotics are administered. General endotracheal anesthesia is induced uneventfully.  A Foley catheter is placed.  Baseline transesophageal echocardiogram was performed. The patient's chest, abdomen, both groins, and both lower extremities are prepared and draped in a sterile manner. A time out procedure is performed.   Peripheral and transfemoral access was performed by Dr. Burt Knack and will be dictated in his note.   BALLOON AORTIC VALVULOPLASTY:   Not performed   TRANSCATHETER HEART VALVE DEPLOYMENT:   An Edwards Sapien 3 transcatheter heart valve (size 29 mm, model #9600TFX, serial [id number]) was prepared and crimped  per manufacturer's guidelines, and the proper orientation of the valve is confirmed on the [employer] delivery system. The valve was advanced through the introducer sheath using normal technique until in an appropriate position in the abdominal aorta beyond the sheath tip. The balloon was then retracted and using the fine-tuning wheel was centered on the valve. The valve was then advanced across the aortic arch using appropriate flexion of the catheter. The valve was carefully positioned across the aortic valve annulus. The Commander catheter was retracted using normal technique. Once final position of the valve has been confirmed by  angiographic assessment, the valve is deployed while temporarily holding ventilation and during rapid ventricular pacing to maintain systolic blood pressure < 50 mmHg and pulse pressure < 10 mmHg. The balloon inflation is held for >3 seconds after reaching full deployment volume. Once the balloon has fully deflated the balloon is retracted into the ascending aorta and valve function is assessed using echocardiography. There is felt to be no paravalvular leak and no central aortic insufficiency.  The patient's hemodynamic recovery following valve deployment is good.  The deployment balloon and guidewire are both removed. Echo demostrated acceptable post-procedural gradients, stable mitral valve function, and no aortic insufficiency.     PROCEDURE COMPLETION:   The sheath was removed and femoral artery closure performed by Dr Burt Knack. Please see his separate report for details.  Protamine was administered once femoral arterial repair was complete. The temporary pacemaker, pigtail catheters and femoral sheaths were removed with manual pressure used for hemostasis.   The patient tolerated the procedure well and is transported to the surgical intensive care in stable condition. There were no immediate intraoperative complications. All sponge instrument and needle counts are verified correct at completion of the operation.   No blood products were administered during the operation.  The patient received a total of 40 mL of intravenous contrast during the procedure.   Gaye Pollack, MD 05/16/2016

## 2016-05-16 NOTE — Progress Notes (Signed)
  Echocardiogram Echocardiogram Transesophageal has been performed.  [name] 05/16/2016, 9:34 AM

## 2016-05-16 NOTE — Anesthesia Postprocedure Evaluation (Signed)
Anesthesia Post Note  Patient: [name]  Procedure(s) Performed: Procedure(s) (LRB): TRANSCATHETER AORTIC VALVE REPLACEMENT, TRANSFEMORAL (N/A) TRANSESOPHAGEAL ECHOCARDIOGRAM (TEE) (N/A)  Anesthesia Post Evaluation  Last Vitals:  Vitals:   05/16/16 1700 05/16/16 1800  BP: 138/66 (!) 158/82  Pulse: 73 87  Resp: 17 19  Temp:      Last Pain:  Vitals:   05/16/16 1514  TempSrc: Oral  PainSc:                  Nyron Mozer COKER

## 2016-05-16 NOTE — Progress Notes (Signed)
Anesthesiology Follow-up:  Awake and alert, sitting in chair, neuro intact, minimal  Pain, taking po well.  VS: T- 36.6 BP- 138/66 HR- 73 RR 17 O2 sat 99% on 4L  Doing well S/P TAVR today.

## 2016-05-16 NOTE — Transfer of Care (Signed)
Immediate Anesthesia Transfer of Care Note  Patient: [name]  Procedure(s) Performed: Procedure(s): TRANSCATHETER AORTIC VALVE REPLACEMENT, TRANSFEMORAL (N/A) TRANSESOPHAGEAL ECHOCARDIOGRAM (TEE) (N/A)  Patient Location: SICU  Anesthesia Type:General  Level of Consciousness: awake, alert  and oriented  Airway & Oxygen Therapy: Patient Spontanous Breathing and Patient connected to face mask oxygen  Post-op Assessment: Report given to RN, Post -op Vital signs reviewed and stable and Patient moving all extremities X 4  Post vital signs: Reviewed and stable  Last Vitals:  Vitals:   05/16/16 0943 05/16/16 0948  BP:    Pulse: (!) 0 (!) 0  Resp:    Temp:      Last Pain: There were no vitals filed for this visit.    Patients Stated Pain Goal: 2 ([id number])  Complications: No apparent anesthesia complications

## 2016-05-16 NOTE — Anesthesia Postprocedure Evaluation (Signed)
Anesthesia Post Note  Patient: [name]  Procedure(s) Performed: Procedure(s) (LRB): TRANSCATHETER AORTIC VALVE REPLACEMENT, TRANSFEMORAL (N/A) TRANSESOPHAGEAL ECHOCARDIOGRAM (TEE) (N/A)  Patient location during evaluation: SICU Anesthesia Type: General Level of consciousness: awake, awake and alert and oriented Pain management: pain level controlled Vital Signs Assessment: post-procedure vital signs reviewed and stable Respiratory status: spontaneous breathing Cardiovascular status: blood pressure returned to baseline Postop Assessment: no headache Anesthetic complications: no    Last Vitals:  Vitals:   05/16/16 1700 05/16/16 1800  BP: 138/66 (!) 158/82  Pulse: 73 87  Resp: 17 19  Temp:      Last Pain:  Vitals:   05/16/16 1514  TempSrc: Oral  PainSc:                  Tynesia Harral COKER

## 2016-05-16 NOTE — Anesthesia Procedure Notes (Signed)
Procedure Name: Intubation Date/Time: 05/16/2016 7:53 AM Performed by: Mariea Clonts Pre-anesthesia Checklist: Patient identified, Emergency Drugs available, Suction available and Patient being monitored Patient Re-evaluated:Patient Re-evaluated prior to inductionOxygen Delivery Method: Circle System Utilized Preoxygenation: Pre-oxygenation with 100% oxygen Intubation Type: IV induction Ventilation: Mask ventilation without difficulty and Oral airway inserted - appropriate to patient size Laryngoscope Size: Glidescope Grade View: Grade I Tube type: Oral Tube size: 8.0 mm Number of attempts: 1 Airway Equipment and Method: Stylet,  Oral airway and Video-laryngoscopy Placement Confirmation: ETT inserted through vocal cords under direct vision,  positive ETCO2 and breath sounds checked- equal and bilateral Tube secured with: Tape Dental Injury: Teeth and Oropharynx as per pre-operative assessment

## 2016-05-17 ENCOUNTER — Inpatient Hospital Stay (HOSPITAL_COMMUNITY): Payer: Medicare HMO

## 2016-05-17 ENCOUNTER — Encounter (HOSPITAL_COMMUNITY): Payer: Self-pay | Admitting: Cardiovascular Disease

## 2016-05-17 ENCOUNTER — Telehealth: Payer: Self-pay | Admitting: *Deleted

## 2016-05-17 DIAGNOSIS — I350 Nonrheumatic aortic (valve) stenosis: Principal | ICD-10-CM

## 2016-05-17 DIAGNOSIS — Z954 Presence of other heart-valve replacement: Secondary | ICD-10-CM

## 2016-05-17 LAB — ECHOCARDIOGRAM LIMITED
AO mean calculated velocity dopler: 134 cm/s
AOPV: 0.71 m/s
AV Area mean vel: 2.43 cm2
AV Mean grad: 9 mmHg
AV peak Index: 1.25
AV pk vel: 203 cm/s
AVAREAMEANVIN: 1.24 cm2/m2
AVAREAVTI: 2.45 cm2
AVAREAVTIIND: 1.31 cm2/m2
AVCELMEANRAT: 0.7
AVLVOTPG: 8 mmHg
AVPG: 16 mmHg
CHL CUP AV VALUE AREA INDEX: 1.31
CHL CUP AV VEL: 2.56
CHL CUP DOP CALC LVOT VTI: 29.1 cm
CHL CUP MV DEC (S): 215
CHL CUP MV M VEL: 99
E decel time: 215 msec
FS: 20 % — AB (ref 28–44)
HEIGHTINCHES: 68 in
IV/PV OW: 1.07
LA diam end sys: 50 mm
LA diam index: 2.55 cm/m2
LA vol A4C: 51.1 ml
LA vol index: 36.4 mL/m2
LA vol: 71.4 mL
LASIZE: 50 mm
LV PW d: 9.6 mm — AB (ref 0.6–1.1)
LVOT MV VTI INDEX: 1.03 cm2/m2
LVOT MV VTI: 2.02
LVOT area: 3.46 cm2
LVOT diameter: 21 mm
LVOT peak VTI: 0.74 cm
LVOT peak vel: 144 cm/s
LVOTSV: 101 mL
MV Annulus VTI: 49.9 cm
MV pk A vel: 88.8 m/s
MV pk E vel: 144 m/s
MVAP: 2.44 cm2
MVG: 5 mmHg
MVPG: 8 mmHg
P 1/2 time: 90 ms
VTI: 39.4 cm
Valve area: 2.56 cm2
WEIGHTICAEL: 2904 [oz_av]

## 2016-05-17 LAB — MAGNESIUM: MAGNESIUM: 1.8 mg/dL (ref 1.7–2.4)

## 2016-05-17 LAB — BASIC METABOLIC PANEL
ANION GAP: 7 (ref 5–15)
BUN: 15 mg/dL (ref 6–20)
CHLORIDE: 102 mmol/L (ref 101–111)
CO2: 27 mmol/L (ref 22–32)
Calcium: 8.7 mg/dL — ABNORMAL LOW (ref 8.9–10.3)
Creatinine, Ser: 0.99 mg/dL (ref 0.61–1.24)
GFR calc non Af Amer: >60 mL/min (ref >60)
Glucose, Bld: 113 mg/dL — ABNORMAL HIGH (ref 65–99)
Potassium: 4.2 mmol/L (ref 3.5–5.1)
SODIUM: 136 mmol/L (ref 135–145)

## 2016-05-17 LAB — CBC
HEMATOCRIT: 24.8 % — AB (ref 39.0–52.0)
HEMOGLOBIN: 8.1 g/dL — AB (ref 13.0–17.0)
MCH: 32.1 pg (ref 26.0–34.0)
MCHC: 32.7 g/dL (ref 30.0–36.0)
MCV: 98.4 fL (ref 78.0–100.0)
Platelets: 97 10*3/uL — ABNORMAL LOW (ref 150–400)
RBC: 2.52 MIL/uL — AB (ref 4.22–5.81)
RDW: 15.4 % (ref 11.5–15.5)
WBC: 6.2 10*3/uL (ref 4.0–10.5)

## 2016-05-17 MED ORDER — IRBESARTAN 150 MG PO TABS
75.0000 mg | ORAL_TABLET | Freq: Every day | ORAL | Status: DC
  Administered 2016-05-17: 75 mg via ORAL
  Filled 2016-05-17: qty 1

## 2016-05-17 MED ORDER — PERFLUTREN LIPID MICROSPHERE
1.0000 mL | INTRAVENOUS | Status: AC | PRN
  Administered 2016-05-17: 2 mL via INTRAVENOUS
  Filled 2016-05-17: qty 10

## 2016-05-17 MED ORDER — PERFLUTREN LIPID MICROSPHERE
INTRAVENOUS | Status: AC
  Filled 2016-05-17: qty 10

## 2016-05-17 MED ORDER — FUROSEMIDE 20 MG PO TABS
20.0000 mg | ORAL_TABLET | Freq: Every day | ORAL | Status: DC
  Administered 2016-05-17: 20 mg via ORAL
  Filled 2016-05-17: qty 1

## 2016-05-17 MED FILL — Insulin Regular (Human) Inj 100 Unit/ML: INTRAMUSCULAR | Qty: 250 | Status: AC

## 2016-05-17 NOTE — Progress Notes (Signed)
Order to discharge received.  IV and tele removed, CCMD notified.

## 2016-05-17 NOTE — Progress Notes (Signed)
    Subjective:  Feels well. No c/o. No CP or dyspnea.   Objective:  Vital Signs in the last 24 hours: Temp:  [97.4 F (36.3 C)-98.4 F (36.9 C)] 98.4 F (36.9 C) (09/27 0700) Pulse Rate:  [0-192] 68 (09/27 0700) Resp:  [13-21] 20 (09/27 0700) BP: (96-158)/(42-93) 115/48 (09/27 0700) SpO2:  [90 %-100 %] 96 % (09/27 0700) Arterial Line BP: (95-147)/(40-56) 147/56 (09/26 1700)  Intake/Output from previous day: 09/26 0701 - 09/27 0700 In: 3271.4 [P.O.:840; I.V.:1806.4; IV Piggyback:500] Out: 1230 [Urine:1190; Blood:40]  Physical Exam: Pt is alert and oriented, NAD, sitting up in chair HEENT: normal Neck: JVP - normal Lungs: CTA bilaterally CV: RRR with 2/6 systolic murmur at the LSB and apex Abd: soft, NT, Positive BS, no hepatomegaly Ext: no C/C/E, distal pulses intact and equal, BL groin sites clear Skin: warm/dry no rash   Lab Results:  Recent Labs  05/16/16 1015 05/17/16 0320  WBC 9.4 6.2  HGB 8.5*  8.2* 8.1*  PLT 106* 97*    Recent Labs  05/16/16 0928 05/16/16 1015 05/17/16 0320  NA 136 138 136  K 3.8 3.6 4.2  CL 100*  --  102  CO2  --   --  27  GLUCOSE 164* 136* 113*  BUN 21*  --  15  CREATININE 0.70  --  0.99   No results for input(s): TROPONINI in the last 72 hours.  Invalid input(s): CK, MB  Cardiac Studies: Post-TAVR echo pending  Tele: AV sequential pacing  Assessment/Plan:  1. Severe aortic stenosis POD #1 from TAVR: progressing well. On ASA/plavix. Check echo today. Ambulate/mobilize. Tx 2W today, consider DC home this afternoon or tomorrow am.   2. Complete heart block - s/p PPM placement, tele reviewed and rhythm stable.  3. HTN: BP controlled on current Rx  4. Anemia, post-op blood loss, mild, acute on chronic  5. Thrombocytopenia, mild: expected Terrilee Croak, M.D. 05/17/2016, 7:30 AM

## 2016-05-17 NOTE — Progress Notes (Signed)
  Echocardiogram 2D Echocardiogram limited has been performed.  Tresa Res 05/17/2016, 12:38 PM

## 2016-05-17 NOTE — Progress Notes (Signed)
1 Day Post-Op Procedure(s) (LRB): TRANSCATHETER AORTIC VALVE REPLACEMENT, TRANSFEMORAL (N/A) TRANSESOPHAGEAL ECHOCARDIOGRAM (TEE) (N/A) Subjective:  No complaints. Walking well.  Objective: Vital signs in last 24 hours: Temp:  [97.8 F (36.6 C)-98.4 F (36.9 C)] 98.2 F (36.8 C) (09/27 1100) Pulse Rate:  [63-87] 65 (09/27 1200) Cardiac Rhythm: Ventricular paced (09/27 1309) Resp:  [15-25] 17 (09/27 1200) BP: (102-158)/(46-93) 119/57 (09/27 1200) SpO2:  [90 %-100 %] 99 % (09/27 1200) Arterial Line BP: (131-147)/(56) 147/56 (09/26 1700)  Hemodynamic parameters for last 24 hours:    Intake/Output from previous day: 09/26 0701 - 09/27 0700 In: 3271.4 [P.O.:840; I.V.:1806.4; IV Piggyback:500] Out: 1230 [Urine:1190; Blood:40] Intake/Output this shift: Total I/O In: 13 [I.V.:13] Out: 500 [Urine:500]  General appearance: alert and cooperative Neurologic: intact Heart: regular rate and rhythm, S1, S2 normal, no murmur, click, rub or gallop Lungs: clear to auscultation bilaterally Extremities: extremities normal, atraumatic, no cyanosis or edema Wound: groin access sites ok  Lab Results:  Recent Labs  05/16/16 1015 05/17/16 0320  WBC 9.4 6.2  HGB 8.5*  8.2* 8.1*  HCT 25.4*  24.0* 24.8*  PLT 106* 97*   BMET:  Recent Labs  05/16/16 0928 05/16/16 1015 05/17/16 0320  NA 136 138 136  K 3.8 3.6 4.2  CL 100*  --  102  CO2  --   --  27  GLUCOSE 164* 136* 113*  BUN 21*  --  15  CREATININE 0.70  --  0.99  CALCIUM  --   --  8.7*    PT/INR:  Recent Labs  05/16/16 1015  LABPROT 16.9*  INR 1.36   ABG    Component Value Date/Time   PHART 7.356 05/16/2016 1111   HCO3 27.5 05/16/2016 1111   TCO2 29 05/16/2016 1111   O2SAT 95.0 05/16/2016 1111   CBG (last 3)  No results for input(s): GLUCAP in the last 72 hours.  Assessment/Plan: S/P Procedure(s) (LRB): TRANSCATHETER AORTIC VALVE REPLACEMENT, TRANSFEMORAL (N/A) TRANSESOPHAGEAL ECHOCARDIOGRAM (TEE)  (N/A)  He looks great POD 1. Paced rhythm Plan 2D echo today Mobilize Possibly home later today.   LOS: 1 day    Gaye Pollack 05/17/2016

## 2016-05-17 NOTE — Addendum Note (Signed)
Addended by: Janan Halter F on: 05/17/2016 10:42 AM   Modules accepted: Orders

## 2016-05-17 NOTE — Telephone Encounter (Signed)
-----   Message from Candis Schatz sent at 05/17/2016  4:26 PM EDT ----- Regarding: TOC Patient is going home today, has a TOC appt with Scott next week. Please make sure someone calls the pt.  Thanks Trisha

## 2016-05-17 NOTE — Discharge Summary (Signed)
Discharge Summary    Patient ID: [name],  MRN: [id number], DOB/AGE: Jun 13, 1931 82 y.o.  Admit date: 05/16/2016 Discharge date: 05/17/2016  Primary Care Provider: Asencion [name] Primary Cardiologist: Dr. Burt [name]  Discharge Diagnoses    Active Problems:   Severe aortic stenosis   Allergies Allergies  Allergen Reactions  . Propylene Glycol Hives and Rash    Diagnostic Studies/Procedures    TAVR-05/16/16  2D echo: 05/17/16  Study Conclusions  - Left ventricle: The cavity size was mildly dilated. There was   mild concentric hypertrophy. Systolic function was mildly   reduced. The estimated ejection fraction was in the range of 45%   to 50%. Hypokinesis of the basal and mid inferior and   inferolateral walls. - Aortic valve: 29 mmEdward [name] TAVR valve sits well in the   aortic position. Mean gradient (S): 10 mm Hg. Peak gradient (S):   20 mm Hg. Valve area (VTI): 2.56 cm^2. Valve area (Vmax): 2.45   cm^2. Valve area (Vmean): 2.43 cm^2. - Aortic root: The aortic root was normal in size. - Mitral valve: Calcified annulus. Mildly thickened leaflets . The   findings are consistent with mild stenosis. There was mild   regurgitation. Mean gradient (D): 5 mm Hg. Valve area by pressure   half-time: 2.44 cm^2. Valve area by continuity equation (using   LVOT flow): 2.02 cm^2. - Left atrium: The atrium was mildly dilated. - Right ventricle: Pacer wire or catheter noted in right ventricle.   Systolic function was normal. - Right atrium: Pacer wire or catheter noted in right atrium. - Tricuspid valve: Structurally normal valve. - Inferior vena cava: The vessel was dilated. The respirophasic   diameter changes were blunted (< 50%), consistent with elevated   central venous pressure. - Pericardium, extracardiac: There was no pericardial effusion.  Impressions:  - 16 mmEdward [name] TAVR valve sits well in the aortic position.   Normal transaortic gradients. No central  aortic regurgitation or   paravalvular leak was seen. _____________   History of Present Illness     82 year old gentleman with hypertension, hyperlipidemia, hypothyroidism, cerebrovascular disease s/p left carotid endarterectomy in 2009 and known coronary disease s/p CABG by Dr. Cyndia [name] in 1995 and subsequent SVG PCI/stenting in 2010 and 10/2015. He has had known moderate aortic stenosis that has been followed and over the past year has developed exertional shortness of breath and fatigue. He has also had recurrent episodes of exertional dizziness and syncope. He says that he knows when he is going to have one of those episodes because he gets weak and feels like things are closing in one him. He has had a loop recorder with no documented arrhthymias. He has usually been able to sit down prior to passing out. He says that he probably had 5 syncopal episodes over the past couple years and his symptoms were not improved after his PCI in 10/2015.He had a repeat 2D echo on 03/29/2016 showing moderately thickened and calcified aortic valve leaflets with severely reduced leaflet separation. The mean gradient was 21 mm Hg with a peak of 36 mm Hg. The peak velocity ratio was 0.27. The LVEF was 50%. He had an ETT on 8/23 and reached 85% of his max heart rate with no symptoms. He stopped due to fatigue. He had a cardiac cath on 04/19/2016 showing severe native vessel disease with occlusion of the LM and RCA with continued patency of the LIMA to LAD, SVG to OM and SVG to  the PDA with moderate in-stent restenosis in the PDA graft. Cardiac output was 3.93 with an index of 2.04 with a mean AV gradient of 24.5 mm Hg and an AVA of 0.82. He had what was felt to be an atrial tachycardia throughout the procedure to 110-120 bpm. Dr. Burt [name] attempted to give him dobutamine to evaluate his gradient but this was not tolerated due to his tachycardia. He had a TEE on 9/1 showing a mean AV gradient of 29 mm Hg with an AVA by planimetry of  0.88 cm2. There was an LVEF of 45-50% with mild MR. The valve looked like a severely stenotic valve. He was set up to see me in the office for consideration of TAVR but was admitted to [facility] on 9/5 after a syncopal episode while pumping gas. He says that he felt bad all of the sudden and passed out hitting the concrete. He does not remember anything until he woke up. He sustained a bad contusion and laceration to the right forehead and abrasions to his extremities. He was transferred to [facility] for further evaluation and interrogation of his loop recorder showed complete heart block at the time of his syncope. He had a pacer implanted by EP.  The patient lives at home with his wife and is very active and independent. He feels like he has been limited recently due to fatigue and SOB with exertion and recurrent episodes of dizziness. He has had no chest pain or pressure.  Hospital Course     Consultants: CVTS   [name] presented on 9/26 for TAVR with Dr. Burt [name] and Dr. Cyndia [name] and tolerated the procedure well. He was observed overnight in the unit without any noted complications. He was continued on ASA and Plavix. He had a follow up 2D echo showing 29 mm Particia Lather TAVR swell positioned with normal transaortic gradients, and no AR or paravalvular leaks noted.   He was transferred to telemetry, ambulated well with the nursing staff. Telemetry was reviewed and noted stable rhythm. Blood pressure had noted good control. He did have mild thrombocytopenia and anemia which was to be expected post procedure.   He was seen and assessed by Dr. Burt [name] on 9/27 and noted stable for discharge home. Follow up in the office has been arranged, and the patient had no further questions prior to discharge.   _____________  Discharge Vitals Blood pressure (!) 119/57, pulse 65, temperature 98.2 F (36.8 C), resp. rate 17, height 5\' 8"  (1.727 m), weight 181 lb 8 oz (82.3 kg), SpO2 99 %.  Filed Weights   05/16/16 0607    Weight: 181 lb 8 oz (82.3 kg)    Labs & Radiologic Studies    CBC  Recent Labs  05/16/16 1015 05/17/16 0320  WBC 9.4 6.2  HGB 8.5*  8.2* 8.1*  HCT 25.4*  24.0* 24.8*  MCV 96.2 98.4  PLT 106* 97*   Basic Metabolic Panel  Recent Labs  05/16/16 0928 05/16/16 1015 05/17/16 0320  NA 136 138 136  K 3.8 3.6 4.2  CL 100*  --  102  CO2  --   --  27  GLUCOSE 164* 136* 113*  BUN 21*  --  15  CREATININE 0.70  --  0.99  CALCIUM  --   --  8.7*  MG  --   --  1.8   Liver Function Tests No results for input(s): AST, ALT, ALKPHOS, BILITOT, PROT, ALBUMIN in the last 72 hours. No results for input(s): LIPASE,  AMYLASE in the last 72 hours. Cardiac Enzymes No results for input(s): CKTOTAL, CKMB, CKMBINDEX, TROPONINI in the last 72 hours. BNP Invalid input(s): POCBNP D-Dimer No results for input(s): DDIMER in the last 72 hours. Hemoglobin A1C No results for input(s): HGBA1C in the last 72 hours. Fasting Lipid Panel No results for input(s): CHOL, HDL, LDLCALC, TRIG, CHOLHDL, LDLDIRECT in the last 72 hours. Thyroid Function Tests No results for input(s): TSH, T4TOTAL, T3FREE, THYROIDAB in the last 72 hours.  Invalid input(s): FREET3 _____________   Dg Chest Port 1 View  Result Date: 05/17/2016 CLINICAL DATA:  82 year old male status post TAVR. Initial encounter. EXAM: PORTABLE CHEST 1 VIEW COMPARISON:  05/16/2016 and earlier. FINDINGS: Portable AP semi upright view at 0545 hours. Stable right IJ central line. Stable sequelae of prior CABG. Prosthetic aortic valve re- demonstrated and positioning appears stable. Stable cardiac size and mediastinal contours. Left chest cardiac pacemaker again noted. No pneumothorax, pulmonary edema, or pleural effusion. Regressed streaky retrocardiac opacity. Partially visible ACDF hardware in the cervical spine. IMPRESSION: 1. Stable right IJ central line. Stable positioning of prosthetic aortic valve. 2. Regressed retrocardiac atelectasis. No new  cardiopulmonary abnormality. Electronically Signed   By: Genevie Ann M.D.   On: 05/17/2016 07:45   Dg Chest Port 1 View  Result Date: 05/16/2016 CLINICAL DATA:  Post TAVR EXAM: PORTABLE CHEST 1 VIEW COMPARISON:  Chest x-ray of 05/10/2016 FINDINGS: The lungs are not quite as well aerated. Mild left basilar linear atelectasis is present medially. Right IJ central venous line tip is seen to the lower SVC just above the expected right atrial junction. No pneumothorax is seen. Cardiomegaly is stable. Aortic valve replacement is noted and permanent pacemaker remains. Degenerative changes noted in both shoulders, left greater than right IMPRESSION: 1. Slightly diminished aeration. Mild left basilar linear atelectasis. 2. Right IJ central venous line tip seen to the lower SVC. Electronically Signed   By: Ivar Drape M.D.   On: 05/16/2016 11:03    Disposition   Pt is being discharged home today in good condition.  Follow-up Plans & Appointments    Follow-up Information    [name], PA-C Follow up on 05/24/2016.   Specialties:  Cardiology, Physician Assistant Why:  8:45am for your hospital follow up. Contact information: [id number] [place] [address] [phone]          Discharge Instructions    Call MD for:  difficulty breathing, headache or visual disturbances    Complete by:  As directed    Call MD for:  persistant dizziness or light-headedness    Complete by:  As directed    Call MD for:  redness, tenderness, or signs of infection (pain, swelling, redness, odor or green/yellow discharge around incision site)    Complete by:  As directed    Discharge instructions    Complete by:  As directed    ACTIVITY AND EXERCISE . Daily activity and exercise are an important part of your recovery. People recover at different rates depending on their general health and type of valve procedure. . Most people require six to 10 weeks to feel recovered. . No lifting, pushing,  pulling more than 10 pounds (examples to avoid: groceries, vacuuming, gardening, golfing):  - For one week with a procedure through the groin.  - For six weeks for procedures through the chest wall.  - For three months for procedures through the breast-bone. NOTE: You will typically see one of our providers 7-10 days after  your procedure to discuss WHEN TO RESUME the above activities.    DRIVING . Do not drive for four weeks after the date of your procedure. . If you have been told by your doctor in the past that you may not drive, you must talk with him/her before you begin driving again. . When you resume driving, you must have someone with you.   DRESSING . Groin site: you may leave the clear dressing over the site for up to one week or until it falls off.   HYGIENE . If you had a femoral (leg) procedure, you may take a shower when you return home. After the shower, pat the site dry. Do NOT use powder, oils or lotions in your groin area until the site has completely healed. . If you had a chest procedure, you may shower when you return home unless specifically instructed not to by your discharging practitioner.  - DO NOT scrub incision; pat dry with a towel  - DO NOT apply any lotions, oils, powders to the incision  - No tub baths / swimming for at least six weeks. . If you notice any fevers, chills, increased pain, swelling, bleeding or pus, please contact your doctor.  ADDITIONAL INFORMATION . If you are going to have an upcoming dental procedure, please contact our office as you may require antibiotics ahead of time to prevent infection on your heart valve.   Increase activity slowly    Complete by:  As directed       Discharge Medications   Current Discharge Medication List    CONTINUE these medications which have NOT CHANGED   Details  allopurinol (ZYLOPRIM) 300 MG tablet Take 600 mg by mouth daily.     aspirin EC 81 MG tablet Take 81 mg by mouth daily.      atorvastatin (LIPITOR) 20 MG tablet Take 20 mg by mouth every evening.     clobetasol cream (TEMOVATE) [id number] % Apply 1 application topically 2 (two) times daily.    clopidogrel (PLAVIX) 75 MG tablet Take 1 tablet (75 mg total) by mouth daily with breakfast. Qty: 90 tablet, Refills: 3    fluticasone (FLONASE) 50 MCG/ACT nasal spray Place 1 spray into both nostrils 2 (two) times daily.    furosemide (LASIX) 20 MG tablet Take 20 mg by mouth daily.    latanoprost (XALATAN) 0.005 % ophthalmic solution Place 1 drop into both eyes at bedtime.    levothyroxine (SYNTHROID, LEVOTHROID) 112 MCG tablet Take 112 mcg by mouth daily before breakfast.    metoprolol succinate (TOPROL-XL) 50 MG 24 hr tablet Take 1 tablet (50 mg total) by mouth daily. Take with or immediately following a meal. Qty: 30 tablet, Refills: 1    multivitamin (THERAGRAN) per tablet Take 1 tablet by mouth daily.     nitroGLYCERIN (NITROSTAT) 0.4 MG SL tablet Place 0.4 mg under the tongue every 5 (five) minutes x 3 doses as needed. For chest pain    pyridOXINE (VITAMIN B-6) 100 MG tablet Take 100 mg by mouth daily.     Tamsulosin HCl (FLOMAX) 0.4 MG CAPS Take 0.4 mg by mouth daily.     valsartan (DIOVAN) 80 MG tablet Take 1 tablet (80 mg total) by mouth daily. Qty: 30 tablet, Refills: 6          Outstanding Labs/Studies   F/U CBC at office visit  Duration of Discharge Encounter   Greater than 30 minutes including physician time.  Signed, Reino Bellis NP-C 05/17/2016, 5:28  PM

## 2016-05-18 NOTE — Telephone Encounter (Signed)
Patient contacted regarding discharge from [facility] on 05/17/16.  Patient understands to follow up with [name], PA-C on10/4/17 at 8:45 am at the [facility] location. Patient understands discharge instructions? Yes Patient understands medications and regiment? Yes Patient understands to bring all medications to this visit? Yes    Pt reports that he gets SOB making circles around his house.  He is walking several times a day.  He is not SOB at rest or lying down.  Also reports that he felt his pacemaker "kick" yesterday and this morning.  He has a new transmitter and does not think it is set up yet.  He is aware I will forward his message to device clinic and ask someone to call him tomorrow.   He voices understanding and appreciation for the call.

## 2016-05-18 NOTE — Telephone Encounter (Signed)
Reviewed wound care, showering and continuing to walk on level surfaces around his house.

## 2016-05-24 ENCOUNTER — Other Ambulatory Visit: Payer: Self-pay

## 2016-05-24 ENCOUNTER — Encounter: Payer: Self-pay | Admitting: Physician Assistant

## 2016-05-24 ENCOUNTER — Ambulatory Visit (INDEPENDENT_AMBULATORY_CARE_PROVIDER_SITE_OTHER): Payer: Medicare HMO | Admitting: Physician Assistant

## 2016-05-24 ENCOUNTER — Ambulatory Visit (HOSPITAL_COMMUNITY): Payer: Medicare HMO | Attending: Cardiovascular Disease

## 2016-05-24 VITALS — BP 150/60 | HR 83 | Ht 68.0 in | Wt 185.1 lb

## 2016-05-24 DIAGNOSIS — Z952 Presence of prosthetic heart valve: Secondary | ICD-10-CM

## 2016-05-24 DIAGNOSIS — I5043 Acute on chronic combined systolic (congestive) and diastolic (congestive) heart failure: Secondary | ICD-10-CM | POA: Diagnosis not present

## 2016-05-24 DIAGNOSIS — I059 Rheumatic mitral valve disease, unspecified: Secondary | ICD-10-CM | POA: Insufficient documentation

## 2016-05-24 DIAGNOSIS — Z953 Presence of xenogenic heart valve: Secondary | ICD-10-CM | POA: Diagnosis not present

## 2016-05-24 DIAGNOSIS — D649 Anemia, unspecified: Secondary | ICD-10-CM

## 2016-05-24 DIAGNOSIS — I509 Heart failure, unspecified: Secondary | ICD-10-CM | POA: Diagnosis not present

## 2016-05-24 DIAGNOSIS — I25810 Atherosclerosis of coronary artery bypass graft(s) without angina pectoris: Secondary | ICD-10-CM | POA: Diagnosis not present

## 2016-05-24 DIAGNOSIS — I739 Peripheral vascular disease, unspecified: Secondary | ICD-10-CM

## 2016-05-24 DIAGNOSIS — E785 Hyperlipidemia, unspecified: Secondary | ICD-10-CM | POA: Insufficient documentation

## 2016-05-24 DIAGNOSIS — E7800 Pure hypercholesterolemia, unspecified: Secondary | ICD-10-CM

## 2016-05-24 DIAGNOSIS — I10 Essential (primary) hypertension: Secondary | ICD-10-CM

## 2016-05-24 DIAGNOSIS — I110 Hypertensive heart disease with heart failure: Secondary | ICD-10-CM | POA: Diagnosis not present

## 2016-05-24 DIAGNOSIS — I779 Disorder of arteries and arterioles, unspecified: Secondary | ICD-10-CM

## 2016-05-24 DIAGNOSIS — L03115 Cellulitis of right lower limb: Secondary | ICD-10-CM | POA: Diagnosis not present

## 2016-05-24 DIAGNOSIS — Z950 Presence of cardiac pacemaker: Secondary | ICD-10-CM

## 2016-05-24 LAB — CBC WITH DIFFERENTIAL/PLATELET
BASOS ABS: 0 {cells}/uL (ref 0–200)
BASOS PCT: 0 %
EOS PCT: 1 %
Eosinophils Absolute: 64 cells/uL (ref 15–500)
HEMATOCRIT: 28.2 % — AB (ref 38.5–50.0)
Hemoglobin: 9.5 g/dL — ABNORMAL LOW (ref 13.2–17.1)
LYMPHS PCT: 10 %
Lymphs Abs: 640 cells/uL — ABNORMAL LOW (ref 850–3900)
MCH: 32.2 pg (ref 27.0–33.0)
MCHC: 33.7 g/dL (ref 32.0–36.0)
MCV: 95.6 fL (ref 80.0–100.0)
MONOS PCT: 10 %
MPV: 9.2 fL (ref 7.5–12.5)
Monocytes Absolute: 640 cells/uL (ref 200–950)
NEUTROS PCT: 79 %
Neutro Abs: 5056 cells/uL (ref 1500–7800)
PLATELETS: 126 10*3/uL — AB (ref 140–400)
RBC: 2.95 MIL/uL — AB (ref 4.20–5.80)
RDW: 15 % (ref 11.0–15.0)
WBC: 6.4 10*3/uL (ref 3.8–10.8)

## 2016-05-24 LAB — ECHOCARDIOGRAM LIMITED
HEIGHTINCHES: 68 in
WEIGHTICAEL: 2961.92 [oz_av]

## 2016-05-24 LAB — BASIC METABOLIC PANEL
BUN: 24 mg/dL (ref 7–25)
CALCIUM: 9.4 mg/dL (ref 8.6–10.3)
CO2: 27 mmol/L (ref 20–31)
CREATININE: 0.93 mg/dL (ref 0.70–1.11)
Chloride: 100 mmol/L (ref 98–110)
GLUCOSE: 99 mg/dL (ref 65–99)
Potassium: 3.9 mmol/L (ref 3.5–5.3)
Sodium: 138 mmol/L (ref 135–146)

## 2016-05-24 MED ORDER — AMOXICILLIN 500 MG PO TABS: 2000.0000 mg | ORAL_TABLET | ORAL | 2 refills | Status: DC | PRN

## 2016-05-24 MED ORDER — POTASSIUM CHLORIDE CRYS ER 20 MEQ PO TBCR: 20.0000 meq | EXTENDED_RELEASE_TABLET | ORAL | 3 refills | Status: DC

## 2016-05-24 MED ORDER — FUROSEMIDE 40 MG PO TABS: 40.0000 mg | ORAL_TABLET | Freq: Every day | ORAL | 3 refills | Status: DC

## 2016-05-24 MED ORDER — CEPHALEXIN 500 MG PO CAPS: 500.0000 mg | ORAL_CAPSULE | Freq: Four times a day (QID) | ORAL | 0 refills | Status: AC

## 2016-05-24 NOTE — Patient Instructions (Addendum)
Medication Instructions:  1. YOU HAVE BEEN GIVEN AND RX FOR AMOXICILLIAN 500 MG TABLET; DIRECTIONS TO READ TAKE 4 TABLETS = 2000 MG 60 MINUTES BEFORE DENTAL PROCEDURE 2. START KEFLEX 500 MG, 1 CAP 4 TIMES DAILY FOR 7 DAYS THEN STOP 3. INCREASE LASIX TO 40 MG TWICE DAILY FOR 2 DAYS THEN CHANGE TO LASIX 40 MG DAILY; NEW RX HAS BEEN SENT IN FOR THE 40 MG TABLET 4. START POTASSIUM 20 MEQ TWICE DAILY FOR 2 DAYS; THEN CHANGE TO POTASSIUM 20 MEQ DAILY; RX HAS BEEN SENT IN   Labwork: 1. TODAY BMET, CBC W/DIFF, BNP 2. BMET TO BE DONE IN 1 WEEK  Testing/Procedures: Your physician has requested that you have an LIMITED echocardiogram. Echocardiography is a painless test that uses sound waves to create images of your heart. It provides your doctor with information about the size and shape of your heart and how well your heart's chambers and valves are working. This procedure takes approximately one hour. There are no restrictions for this procedure. PER SCOTT WEAVER, PAC TO SEE IF THIS COULD BE DONE TODAY; PT IS POST TAVR  Follow-Up: SCOTT WEAVER, PAC 1 WEEK SAME DAY DR. Burt Knack IS IN THE OFFICE IF POSSIBLE  Any Other Special Instructions Will Be Listed Below (If Applicable). If you need a refill on your cardiac medications before your next appointment, please call your pharmacy.

## 2016-05-24 NOTE — Progress Notes (Signed)
Cardiology Office Note:    Date:  05/24/2016   ID:  [name], DOB Dec 27, 1930, MRN [id number]  PCP:  [name], MD  Cardiologist:  Dr. Kate Sable [facility]) TAVR: Dr. [name]     Electrophysiologist:  Dr. [name]   Referring MD: [name], MD   Chief Complaint  Patient presents with  . Hospitalization Follow-up    s/p TAVR    History of Present Illness:    [name] is a 82 y.o. male with a hx of CAD s/p CABG in 1995 and subsequent PCI to S-RPDA with a DES in 3/17, aortic stenosis, combined systolic and diastolic CHF, carotid artery disease s/p L CEA in 2009, HTN, HL, asymptomatic transient 2nd degree AVB (nocturnal), recurrent syncope s/p ILR.  He was ultimately evaluated by Dr. [name] in 8/17 due to symptoms of dyspnea on exertion and associated syncope felt to be related to severe AS.  His AS had previously been felt to be moderate.  He was set up for relook cardiac catheterization. Bypass grafts were patent but he had 70% in-stent restenosis in the SVG-RPDA. Hemodynamic measurements were consistent with moderate to severe aortic stenosis. There was concern for possible atrial tachycardia as well as frequent PVCs. TEE was obtained to evaluate his mitral valve and this demonstrated EF 45-50% with severe aortic stenosis with peak gradient 47 mmHg, mild mitral regurgitation. He was referred to Dr. Cyndia Bent for consideration of TAVR. However, he was admitted 9/5-9/8 with recurrent syncope.  Interrogation of his ILR demonstrated complete heart block. He underwent implantation of CRT-P with Dr. [name].  He was admitted 9/26-9/27 and underwent TAVR within Trystyn's Sapien 3 THV via transfemoral approach. Postoperative echocardiogram demonstrated well-seated TAVR, EF 45-50%. Postoperative course was fairly uneventful. He did have mild thrombocytopenia which was expected post TAVR.  He returns for posthospitalization follow-up.  Here with his wife.  He remains quite  shortness of breath with minimal activity. His legs are swollen. Weight is up 7 lbs by our scales since 8/17.  He denies chest pain, orthopnea, paroxysmal nocturnal dyspnea.  He denies syncope. He has a nonproductive cough.  He denies fevers, chills.  He denies any bleeding.  He does not redness of his R leg.  His trans-fem site for TAVR continues to ooze some.     Prior CV studies that were reviewed today include:    Limited Echo 05/17/16 Mild concentric LVH, EF 45-50%, inferior and inferolateral HK, TAVR okay with mean gradient 10 mmHg and peak 20 mmHg, MAC, mild MS, mild MR, mean MV gradient 5 mmHg, mild LAE  Carotid US 05/12/16 R 40-59%, L 1-39%  TEE 04/21/16 Mild LVH, EF 45-50%, inf and inf-lat HK, severe AS, AVA 0.8-0.9 cm, peak and mean 47 and 29 mmHg, Gr 2 atheroma of aortic arch, mild MR, LA dilated  R/L Heart Cath 04/19/16 LM, LAD and RCA known to be occluded SVG-RPDA stent with 70% ISR SVG-OM1 stent patent with 20-25% ISR LIMA-LAD patent Aortic valve Peak to peak gradient 26 mmHg, mean 22 mmHg, CO 6.7, calculated AVA 1.25 cm >> after IV dobutamine: Peak to peak 26 mmHg, mean 25 mmHg, CO 3.9, AVA 0.82 cm 1. Severe native vessel coronary artery disease with total occlusion of the left main coronary artery and right coronary artery 2. Status post CABG with continued patency of the LIMA to LAD, saphenous vein graft to OM, and saphenous vein graft to PDA with moderate in-stent restenosis in the PDA graft 3. Moderate  to severe aortic stenosis with aortic valve calculations as outlined below 4. Large V waves raising suspicion for significant mitral regurgitation 5. Patient with narrow complex tachycardia heart rate approximately 110 120 bpm throughout the procedure, complicating hemodynamic assessment  LHC 11/17/15 Left Main Ost 100% Left Anterior Descending Ost to Prox 100% Left Circumflex Ost to Mid 100%, OM2 filled by collaterals from Lat 1st Mrg.  Right Coronary Artery Ost  100% Graft Angiography Free LIMA-LAD normal.  S-RPDA proximal 90%, distal 40% S-OM1 normal PCI:  STENT PROMUS PREM MR 4.0X12 to S-RPDA 1. Left main and RCA occlusive disease 2. Patent LIMA to the LAD 3. Patent SVG to the OM 4. Patent SVG to the PDA with severe stenosis in the proximal SVG 5. Moderate LV dysfunction with EF 35% 6. Moderate pulmonary HTN with elevated LV filling pressures. 7. Mild to moderate aortic stenosis. Mean gradient 18 mm Hg. AVA 1.8 cm squared. 8. Successful stenting of the SVG to the RCA with DES.     Past Medical History:  Diagnosis Date  . Aortic stenosis    Mild, echo, August, 2012  . Arthritis   . Benign prostatic hypertrophy    takes Flomax daily  . CAD (coronary artery disease)    DES, SVG to circumflex marginal, October, 2010 ( SVG to PDA and PLA patent , LIMA to LAD patent, EF 60%, mild inferior hypokinesis  . Carotid artery disease ([place])    DES, SVG to circumflex marginal, October, 2010 ( SVG to PDA and PLA patent , LIMA to LAD patent, EF 60%, mild inferior hypokinesis; H/o mild CHF, CABG  . GERD (gastroesophageal reflux disease)    takes daily  . Head trauma 04/25/2016  . History of blood transfusion   . History of vertigo    doesn't take any meds  . HTN (hypertension)    takes Diovan and Metoprolol daily  . Hx of decompressive lumbar laminectomy    Dr.Botero December, 2011  . Hyperlipidemia    takes Lipitor daily  . Hypothyroidism   . Joint pain   . Nephrolithiasis   . Neuromuscular disorder ([place])   . Peripheral edema    takes Furosemide daily  . Peripheral neuropathy ([place])   . Presence of permanent cardiac pacemaker    medtronic  . Psoriasis    severe; with total skin exfoliation in the past resolved  . PVC's (premature ventricular contractions)    May, 2014  . Shortness of breath dyspnea    with exertion  . Ventricular tachycardia, non-sustained [facility])     Past Surgical History:  Procedure Laterality Date  . APPENDECTOMY     . BACK SURGERY     x3  . bilateral L3-4 laminectomies    . CARDIAC CATHETERIZATION N/A 11/17/2015   Procedure: Right/Left Heart Cath and Coronary Angiography;  Surgeon: Peter M Martinique, MD;  Location: [place] CV LAB;  Service: Cardiovascular;  Laterality: N/A;  . CARDIAC CATHETERIZATION N/A 11/17/2015   Procedure: Coronary Stent Intervention;  Surgeon: Peter M Martinique, MD;  Location: [facility] CV LAB;  Service: Cardiovascular;  Laterality: N/A;  . CARDIAC CATHETERIZATION N/A 04/19/2016   Procedure: Right/Left Heart Cath and Coronary/Graft Angiography;  Surgeon: [name], MD;  Location: [place] CV LAB;  Service: Cardiovascular;  Laterality: N/A;  . Carelink Remote Monitoring  Oct. 29, 2015   by Dr. [name]  . CAROTID ENDARTERECTOMY Left 02-24-08   cea  Dr. Amedeo Plenty  . CARPAL TUNNEL RELEASE Bilateral 2010  . CATARACT EXTRACTION,  BILATERAL    . cataract surgery Bilateral   . COLONOSCOPY  11/23/2011   Procedure: COLONOSCOPY;  Surgeon: Rogene Houston, MD;  [facility];  Service: Endoscopy;  Laterality: N/A;  730  . CORONARY ANGIOPLASTY     2 stents  . CORONARY ARTERY BYPASS GRAFT  1995   x 3  . CORONARY STENT PLACEMENT  11/17/2015   SVG   DES to RCA  . decompression of left median nerve    . EP IMPLANTABLE DEVICE N/A 04/27/2016   Procedure: Pacemaker Implant;  Surgeon: [name], MD;  Location: [place] CV LAB;  Service: Cardiovascular;  Laterality: N/A;  . INSERT / REPLACE / REMOVE PACEMAKER    . JOINT REPLACEMENT Left Sept. 27, 2013   Knee  . left carotid endarectomy and Dacron patch angioplasty]    . LEFT HEART CATHETERIZATION WITH CORONARY ANGIOGRAM N/A 12/27/2012   Procedure: LEFT HEART CATHETERIZATION WITH CORONARY ANGIOGRAM;  Surgeon: Burnell Blanks, MD;  Location: [facility] CATH LAB;  Service: Cardiovascular;  Laterality: N/A;  . posterior arthrodesis with autograft and allograft    . removal of synovial cyst    . SPINE SURGERY     X's 3  . TEE  WITHOUT CARDIOVERSION N/A 04/21/2016   Procedure: TRANSESOPHAGEAL ECHOCARDIOGRAM (TEE);  Surgeon: Pixie Casino, MD;  Location: [facility] ENDOSCOPY;  Service: Cardiovascular;  Laterality: N/A;  . TEE WITHOUT CARDIOVERSION N/A 05/16/2016   Procedure: TRANSESOPHAGEAL ECHOCARDIOGRAM (TEE);  Surgeon: [name], MD;  Location: [facility];  Service: Open Heart Surgery;  Laterality: N/A;  . TONSILLECTOMY    . TOTAL KNEE ARTHROPLASTY  04/16/2012   Procedure: TOTAL KNEE ARTHROPLASTY;  Surgeon: Garald Balding, MD;  Location: [place];  Service: Orthopedics;  Laterality: Left;  . TRANSCATHETER AORTIC VALVE REPLACEMENT, TRANSFEMORAL N/A 05/16/2016   Procedure: TRANSCATHETER AORTIC VALVE REPLACEMENT, TRANSFEMORAL;  Surgeon: [name], MD;  Location: [place];  Service: Open Heart Surgery;  Laterality: N/A;    Current Medications: Current Meds  Medication Sig  . allopurinol (ZYLOPRIM) 300 MG tablet Take 600 mg by mouth daily.   Marland Kitchen aspirin EC 81 MG tablet Take 81 mg by mouth daily.  Marland Kitchen atorvastatin (LIPITOR) 20 MG tablet Take 20 mg by mouth every evening.   . clobetasol cream (TEMOVATE) [id number] % Apply 1 application topically 2 (two) times daily as needed (SKIN).   Marland Kitchen clopidogrel (PLAVIX) 75 MG tablet Take 1 tablet (75 mg total) by mouth daily with breakfast.  . Docusate Sodium (COLACE PO) Take 2 tablets by mouth daily.  . fluticasone (FLONASE) 50 MCG/ACT nasal spray Place 1 spray into both nostrils 2 (two) times daily.  Marland Kitchen latanoprost (XALATAN) 0.005 % ophthalmic solution Place 1 drop into both eyes at bedtime.  Marland Kitchen levothyroxine (SYNTHROID, LEVOTHROID) 112 MCG tablet Take 112 mcg by mouth daily before breakfast.  . metoprolol succinate (TOPROL-XL) 50 MG 24 hr tablet Take 1 tablet (50 mg total) by mouth daily. Take with or immediately following a meal.  . multivitamin (THERAGRAN) per tablet Take 1 tablet by mouth daily.   . nitroGLYCERIN (NITROSTAT) 0.4 MG SL tablet Place 0.4 mg under the tongue every 5 (five) minutes x 3  doses as needed. For chest pain  . pyridOXINE (VITAMIN B-6) 100 MG tablet Take 100 mg by mouth daily.   . Tamsulosin HCl (FLOMAX) 0.4 MG CAPS Take 0.4 mg by mouth daily.   . valsartan (DIOVAN) 80 MG tablet Take 1 tablet (80 mg total) by mouth daily.  . [  DISCONTINUED] furosemide (LASIX) 20 MG tablet Take 20 mg by mouth daily.     Allergies:   Propylene glycol   Social History   Social History  . Marital status: Married    Spouse name: N/A  . Number of children: 2  . Years of education: 16   Occupational History  . Retired    Social History Main Topics  . Smoking status: Never Smoker  . Smokeless tobacco: Never Used  . Alcohol use No     Comment: One beer per week.  . Drug use: No  . Sexual activity: Not Currently   Other Topics Concern  . None   Social History Narrative   Lives at home with wife.   1 cup coffee per day.   Right-handed.     Family History:  The patient's family history includes Heart attack in his mother; Heart attack (age of onset: 56) in his son; Heart disease in his father and mother; Hyperlipidemia in his father; Hypertension in his father.   ROS:   Please see the history of present illness.    Review of Systems  Cardiovascular: Positive for dyspnea on exertion and leg swelling.  Hematologic/Lymphatic: Bruises/bleeds easily.  Musculoskeletal: Positive for joint pain.   All other systems reviewed and are negative.   EKGs/Labs/Other Test Reviewed:    EKG:  EKG is  ordered today.  The ekg ordered today demonstrates V paced, HR 83  Recent Labs: 05/12/2016: ALT 38 05/17/2016: BUN 15; Creatinine, Ser 0.99; Hemoglobin 8.1; Magnesium 1.8; Platelets 97; Potassium 4.2; Sodium 136   Recent Lipid Panel    Component Value Date/Time   CHOL 143 08/06/2006 0814   TRIG 141 08/06/2006 0814   HDL 29.7 (L) 08/06/2006 0814   CHOLHDL 4.8 CALC 08/06/2006 0814   VLDL 28 08/06/2006 0814   LDLCALC 85 08/06/2006 0814     Physical Exam:    VS:  BP (!) 150/60    Pulse 83   Ht 5\' 8"  (1.727 m)   Wt 185 lb 1.9 oz (84 kg)   BMI 28.15 kg/m     Wt Readings from Last 3 Encounters:  05/24/16 185 lb 1.9 oz (84 kg)  05/16/16 181 lb 8 oz (82.3 kg)  05/12/16 181 lb 8 oz (82.3 kg)     Physical Exam  Constitutional: He is oriented to person, place, and time. He appears well-developed and well-nourished. No distress.  HENT:  Head: Normocephalic and atraumatic.  Eyes: No scleral icterus.  Neck: JVD present.  JVP 10-11 cm  Cardiovascular: Normal rate, regular rhythm, S1 normal and S2 normal.  Exam reveals friction rub.   Murmur heard.  Low-pitched systolic murmur is present with a grade of 2/6  at the upper right sternal border R FA pulse intact; no pulsatile mass   Pulmonary/Chest: He has decreased breath sounds. He has no wheezes. He has no rhonchi. He has no rales.  Abdominal: Soft. He exhibits distension. There is no tenderness.  Musculoskeletal: He exhibits edema.  2+ bilateral LE edema; R>L; + redness ankle to just inf to knee; R transfemoral site without erythema or purulent d/c;   Neurological: He is alert and oriented to person, place, and time.  Skin: There is erythema.  Psychiatric: He has a normal mood and affect.  R leg - c/w cellulitis    ASSESSMENT:    1. Acute on chronic combined systolic and diastolic CHF (congestive heart failure) ([place])   2. Severe Aortic Stenosis S/P TAVR (transcatheter aortic valve  replacement)   3. Cellulitis of right lower extremity   4. Coronary artery disease involving coronary bypass graft of native heart without angina pectoris   5. S/P biventricular cardiac pacemaker procedure   6. Essential hypertension   7. Pure hypercholesterolemia   8. Anemia, unspecified type   9. Bilateral carotid artery disease ([place])    PLAN:    In order of problems listed above:  1. A/C combined systolic and diastolic congestive heart failure - He is volume overloaded.  He is NYHA 3.  Lungs are clear on exam but his JVP  is up, his legs are swollen and his weight is up.    -  Increase Lasix to 40 mg bid x 2 days, then 40 QD  -  K+ 20 bid x 2 days, then 20 QD  -  BMET, CBC, BNP today  -  BMET 1 week  -  Close FU in 1 week.   2. S/p TAVR - He is still shortness of breath and this is all likely from a/c congestive heart failure. I will adjust his diuretics as noted.  He does have a rub on exam and I had Dr. Lauree Chandler listen to him today as well.    -  Get repeat Limited Echo to r/o effusion  -  Continue SBE prophylaxis - Amox Rx given today as he anticipates dental work in the near future.  3. Cellulitis - R leg is red and +/- warm.  He has a lot of cuts on his legs and I suspect he has cellulitis.  Will give him Keflex 500 mg Q 6 hours x 7 days. Check CBC with diff. FU next week.  4. CAD - s/p CABG and subsequent DES to S-RPDA in 3/17.  LHC in 8/17 with 70% S-RPDA stent ISR.  No chest pain.  He is shortness of breath.  Hopefully this is all from congestive heart failure and not from S-RPDA lesion.    -  Continue ASA, Plavix, beta-blocker, statin. 5. CHB s/p Pacer - He is s/p CRT-P.  FU with EP as planned.    6. HTN - BP elevated. Diurese first.  If BP remains high, consider adjusting angiotensin receptor blocker.  7. HL- continue statin.  8. Anemia/Thrombocytopenia - Blood loss anemia and mild post op thrombocytopenia.  Repeat CBC today.    9. Carotid artery disease - s/p L CEA.  Pre-TAVR [place] with stable bilateral disease.  Followed by [facility].    Medication Adjustments/Labs and Tests Ordered: Current medicines are reviewed at length with the patient today.  Concerns regarding medicines are outlined above.  Medication changes, Labs and Tests ordered today are outlined in the Patient Instructions noted below. Patient Instructions  Medication Instructions:  1. YOU HAVE BEEN GIVEN AND RX FOR AMOXICILLIAN 500 MG TABLET; DIRECTIONS TO READ TAKE 4 TABLETS = 2000 MG 60 MINUTES BEFORE DENTAL PROCEDURE 2.  START KEFLEX 500 MG, 1 CAP 4 TIMES DAILY FOR 7 DAYS THEN STOP 3. INCREASE LASIX TO 40 MG TWICE DAILY FOR 2 DAYS THEN CHANGE TO LASIX 40 MG DAILY; NEW RX HAS BEEN SENT IN FOR THE 40 MG TABLET 4. START POTASSIUM 20 MEQ TWICE DAILY FOR 2 DAYS; THEN CHANGE TO POTASSIUM 20 MEQ DAILY; RX HAS BEEN SENT IN   Labwork: 1. TODAY BMET, CBC W/DIFF, BNP 2. BMET TO BE DONE IN 1 WEEK  Testing/Procedures: Your physician has requested that you have an LIMITED echocardiogram. Echocardiography is a painless test that uses sound  waves to create images of your heart. It provides your doctor with information about the size and shape of your heart and how well your heart's chambers and valves are working. This procedure takes approximately one hour. There are no restrictions for this procedure. PER SCOTT WEAVER, PAC TO SEE IF THIS COULD BE DONE TODAY; PT IS POST TAVR  Follow-Up: SCOTT WEAVER, PAC 1 WEEK SAME DAY DR. Burt Knack IS IN THE OFFICE IF POSSIBLE  Any Other Special Instructions Will Be Listed Below (If Applicable). If you need a refill on your cardiac medications before your next appointment, please call your pharmacy.  Signed, [name], PA-C  05/24/2016 5:33 PM    [facility] [address], [place], [place]  [address] Phone: [phone]; Fax: [phone]

## 2016-05-25 ENCOUNTER — Encounter: Payer: Self-pay | Admitting: Physician Assistant

## 2016-05-25 LAB — BRAIN NATRIURETIC PEPTIDE: BRAIN NATRIURETIC PEPTIDE: 642.3 pg/mL — AB (ref <100)

## 2016-05-31 NOTE — Progress Notes (Signed)
Electrophysiology Office Note Date: 06/01/2016  ID:  [name], DOB 1931/04/20, MRN [id number]  PCP: [name], MD Primary Cardiologist: Bronson Ing Electrophysiologist: [name] StructuralBurt Knack   CC: 6 week post CRT follow up  [name] is a 82 y.o. male seen today for Dr [name].  He presents today for 6 week CRT followup.  Since last being seen in our clinic, the patient reports doing relatively well.  He feels that he diuresed with Lasix but still has some fluid on board.  His cellulitis has improved but is not resolved. He has completed antibiotics.  He denies chest pain, palpitations, PND, orthopnea, nausea, vomiting, dizziness, syncope, weight gain, or early satiety.  Device History: MDT CRTP implanted 2017 for intermittent complete heart block, LV dysfunction   Past Medical History:  Diagnosis Date  . Arthritis   . Benign prostatic hypertrophy   . CAD (coronary artery disease)    DES, SVG to circumflex marginal, October, 2010 ( SVG to PDA and PLA patent , LIMA to LAD patent, EF 60%, mild inferior hypokinesis  . Complete heart block ([place])    a. s/p MDT CRTP 04/2016 Dr [name]  . GERD (gastroesophageal reflux disease)   . HTN (hypertension)   . Hyperlipidemia   . Hypothyroidism   . Nephrolithiasis   . Neuromuscular disorder ([place])   . Peripheral neuropathy ([place])   . Psoriasis    severe; with total skin exfoliation in the past resolved  . S/P TAVR (transcatheter aortic valve replacement)    severe AS >> s/p TAVR 9/17 // Limited Echo 10/17: EF 40-45%, lat and inf-lat HK, TAVR ok, MAC, mild BAE, PASP 31 mmHg, no effusion  . Ventricular tachycardia, non-sustained [facility])    Past Surgical History:  Procedure Laterality Date  . APPENDECTOMY    . bilateral L3-4 laminectomies    . CARDIAC CATHETERIZATION N/A 11/17/2015   Procedure: Right/Left Heart Cath and Coronary Angiography;  Surgeon: Peter M Martinique, MD;  Location: [place] CV LAB;  Service: Cardiovascular;   Laterality: N/A;  . CARDIAC CATHETERIZATION N/A 11/17/2015   Procedure: Coronary Stent Intervention;  Surgeon: Peter M Martinique, MD;  Location: [place] CV [facility];  Service: Cardiovascular;  Laterality: N/A;  . CARDIAC CATHETERIZATION N/A 04/19/2016   Procedure: Right/Left Heart Cath and Coronary/Graft Angiography;  Surgeon: [name], MD;  Location: [place] [facility] LAB;  Service: Cardiovascular;  Laterality: N/A;  . CAROTID ENDARTERECTOMY Left 02-24-08   cea  Dr. Amedeo Plenty  . CARPAL TUNNEL RELEASE Bilateral 2010  . CATARACT EXTRACTION, BILATERAL    . COLONOSCOPY  11/23/2011   Procedure: COLONOSCOPY;  Surgeon: Rogene Houston, MD;  [facility];  Service: Endoscopy;  Laterality: N/A;  730  . CORONARY ANGIOPLASTY     2 stents  . CORONARY ARTERY BYPASS GRAFT  1995   x 3  . CORONARY STENT PLACEMENT  11/17/2015   SVG   DES to RCA  . decompression of left median nerve    . EP IMPLANTABLE DEVICE N/A 04/27/2016   MDT CRTP implanted by Dr [name]  . JOINT REPLACEMENT Left Sept. 27, 2013   Knee  . left carotid endarectomy and Dacron patch angioplasty]    . LEFT HEART CATHETERIZATION WITH CORONARY ANGIOGRAM N/A 12/27/2012   Procedure: LEFT HEART CATHETERIZATION WITH CORONARY ANGIOGRAM;  Surgeon: Burnell Blanks, MD;  Location: [facility] CATH LAB;  Service: Cardiovascular;  Laterality: N/A;  . posterior arthrodesis with autograft and allograft    . removal of synovial  cyst    . TEE WITHOUT CARDIOVERSION N/A 04/21/2016   Procedure: TRANSESOPHAGEAL ECHOCARDIOGRAM (TEE);  Surgeon: Pixie Casino, MD;  Location: [facility] ENDOSCOPY;  Service: Cardiovascular;  Laterality: N/A;  . TEE WITHOUT CARDIOVERSION N/A 05/16/2016   Procedure: TRANSESOPHAGEAL ECHOCARDIOGRAM (TEE);  Surgeon: [name], MD;  Location: [place];  Service: Open Heart Surgery;  Laterality: N/A;  . TONSILLECTOMY    . TOTAL KNEE ARTHROPLASTY  04/16/2012   Procedure: TOTAL KNEE ARTHROPLASTY;  Surgeon: Garald Balding, MD;  Location: [place];   Service: Orthopedics;  Laterality: Left;  . TRANSCATHETER AORTIC VALVE REPLACEMENT, TRANSFEMORAL N/A 05/16/2016   Procedure: TRANSCATHETER AORTIC VALVE REPLACEMENT, TRANSFEMORAL;  Surgeon: [name], MD;  Location: [place];  Service: Open Heart Surgery;  Laterality: N/A;    Current Outpatient Prescriptions  Medication Sig Dispense Refill  . allopurinol (ZYLOPRIM) 300 MG tablet Take 600 mg by mouth daily.     Marland Kitchen amoxicillin (AMOXIL) 500 MG tablet Take 4 tablets (2,000 mg total) by mouth as needed. Take 4 of the 500 mg tablets = 2000 mg 60 minutes before dental procedure 4 tablet 2  . aspirin EC 81 MG tablet Take 81 mg by mouth daily.    Marland Kitchen atorvastatin (LIPITOR) 20 MG tablet Take 20 mg by mouth every evening.     . clobetasol cream (TEMOVATE) [id number] % Apply 1 application topically 2 (two) times daily as needed (SKIN).     Marland Kitchen clopidogrel (PLAVIX) 75 MG tablet Take 1 tablet (75 mg total) by mouth daily with breakfast. 90 tablet 3  . Docusate Sodium (COLACE PO) Take 2 tablets by mouth daily.    . fluticasone (FLONASE) 50 MCG/ACT nasal spray Place 1 spray into both nostrils 2 (two) times daily.    . furosemide (LASIX) 40 MG tablet Take 1 tablet (40 mg total) by mouth daily. 90 tablet 3  . latanoprost (XALATAN) 0.005 % ophthalmic solution Place 1 drop into both eyes at bedtime.    Marland Kitchen levothyroxine (SYNTHROID, LEVOTHROID) 112 MCG tablet Take 112 mcg by mouth daily before breakfast.    . metoprolol succinate (TOPROL-XL) 50 MG 24 hr tablet Take 1 tablet (50 mg total) by mouth daily. Take with or immediately following a meal. 30 tablet 1  . multivitamin (THERAGRAN) per tablet Take 1 tablet by mouth daily.     . nitroGLYCERIN (NITROSTAT) 0.4 MG SL tablet Place 0.4 mg under the tongue every 5 (five) minutes x 3 doses as needed. For chest pain    . potassium chloride SA (K-DUR,KLOR-CON) 20 MEQ tablet Take 1 tablet (20 mEq total) by mouth as directed. Take 20 meq twice daily for 2 days; then change to 20 meq once  a day 90 tablet 3  . pyridOXINE (VITAMIN B-6) 100 MG tablet Take 100 mg by mouth daily.     . Tamsulosin HCl (FLOMAX) 0.4 MG CAPS Take 0.4 mg by mouth daily.     . valsartan (DIOVAN) 80 MG tablet Take 1 tablet (80 mg total) by mouth daily. 30 tablet 6   No current facility-administered medications for this visit.     Allergies:   Propylene glycol   Social History: Social History   Social History  . Marital status: Married    Spouse name: N/A  . Number of children: 2  . Years of education: 16   Occupational History  . Retired    Social History Main Topics  . Smoking status: Never Smoker  . Smokeless tobacco: Never Used  . Alcohol  use No     Comment: One beer per week.  . Drug use: No  . Sexual activity: Not Currently   Other Topics Concern  . Not on file   Social History Narrative   Lives at home with wife.   1 cup coffee per day.   Right-handed.    Family History: Family History  Problem Relation Age of Onset  . Heart attack Mother   . Heart disease Mother     Heart Disease before age 70  . Heart disease Father     Heart Disease before age 3  . Hypertension Father   . Hyperlipidemia Father   . Heart attack Son 63  . Colon cancer Neg Hx      Review of Systems: All other systems reviewed and are otherwise negative except as noted above.   Physical Exam: VS:  BP 140/90   Pulse 72   Ht 5\' 8"  (1.727 m)   Wt 180 lb (81.6 kg)   BMI 27.37 kg/m  , BMI Body mass index is 27.37 kg/m.  GEN- The patient is elderly appearing, alert and oriented x 3 today.   HEENT: normocephalic, atraumatic; sclera clear, conjunctiva pink; hearing intact; oropharynx clear; neck supple  Lungs- Clear to ausculation bilaterally, normal work of breathing.  No wheezes, rales, rhonchi Heart- Regular rate and rhythm (paced) GI- soft, non-tender, non-distended, bowel sounds present  Extremities- no clubbing, cyanosis, BLE edema, R>L MS- no significant deformity or atrophy Skin-  warm and dry, no rash or lesion; PPM pocket well healed Psych- euthymic mood, full affect Neuro- strength and sensation are intact  PPM Interrogation- reviewed in detail today,  See PACEART report  EKG:  EKG is ordered today. EKG today demonstrates SR with ventricular pacing, PVC's  Recent Labs: 05/12/2016: ALT 38 05/17/2016: Magnesium 1.8 05/24/2016: Brain Natriuretic Peptide 642.3; BUN 24; Creat 0.93; Hemoglobin 9.5; Platelets 126; Potassium 3.9; Sodium 138   Wt Readings from Last 3 Encounters:  06/01/16 180 lb 1.9 oz (81.7 kg)  06/01/16 180 lb (81.6 kg)  05/24/16 185 lb 1.9 oz (84 kg)     Other studies Reviewed: Additional studies/ records that were reviewed today include: hospital records, [facility] office notes  Assessment and Plan:  1.  Transient complete heart block Normal PPM function See Pace Art report No changes today  2.  Chronic systolic heart failure Diuretics adjusted last week with improvement in symptoms He could probably use a little more diuresis. Seeing Scott later today who will address.   Effective CRT pacing 86% likely 2/2 PVCs - increase Toprol to 50mg  twice daily today and will re-evaluate at follow up with Dr Allred  3.  AS s/p TAVR Follow up with Dr Burt Knack as scheduled  4.  CAD s/p CABG No recent ischemic symptoms Continue current therapy   5.  HTN Stable No change required today  6.  Atrial flutter Newly diagnosed on device interrogation today in the setting of recent TAVR Will follow for now If recurrence between now and follow up with Dr [name] in 6 weeks, will need to consider Smiley is 5   Current medicines are reviewed at length with the patient today.   The patient does not have concerns regarding his medicines.  The following changes were made today:  none  Labs/ tests ordered today include:  Orders Placed This Encounter  Procedures  . Implantable device check  . EKG 12-Lead     Disposition:   Follow up with  Dr [name] as scheduled    Signed, Chanetta Marshall, NP 06/01/2016 10:45 AM  [facility] [address] [place] [address] [phone] (office) [phone] (fax)

## 2016-06-01 ENCOUNTER — Ambulatory Visit (INDEPENDENT_AMBULATORY_CARE_PROVIDER_SITE_OTHER): Payer: Medicare HMO | Admitting: Physician Assistant

## 2016-06-01 ENCOUNTER — Encounter: Payer: Self-pay | Admitting: Internal Medicine

## 2016-06-01 ENCOUNTER — Encounter: Payer: Self-pay | Admitting: Physician Assistant

## 2016-06-01 ENCOUNTER — Ambulatory Visit (INDEPENDENT_AMBULATORY_CARE_PROVIDER_SITE_OTHER): Payer: Medicare HMO | Admitting: Nurse Practitioner

## 2016-06-01 ENCOUNTER — Encounter: Payer: Self-pay | Admitting: Nurse Practitioner

## 2016-06-01 VITALS — BP 140/90 | HR 72 | Ht 68.0 in | Wt 180.0 lb

## 2016-06-01 VITALS — BP 140/60 | HR 79 | Ht 68.0 in | Wt 180.1 lb

## 2016-06-01 DIAGNOSIS — I509 Heart failure, unspecified: Secondary | ICD-10-CM | POA: Diagnosis not present

## 2016-06-01 DIAGNOSIS — I25810 Atherosclerosis of coronary artery bypass graft(s) without angina pectoris: Secondary | ICD-10-CM

## 2016-06-01 DIAGNOSIS — L03115 Cellulitis of right lower limb: Secondary | ICD-10-CM

## 2016-06-01 DIAGNOSIS — Z952 Presence of prosthetic heart valve: Secondary | ICD-10-CM

## 2016-06-01 DIAGNOSIS — I10 Essential (primary) hypertension: Secondary | ICD-10-CM | POA: Diagnosis not present

## 2016-06-01 DIAGNOSIS — I442 Atrioventricular block, complete: Secondary | ICD-10-CM

## 2016-06-01 DIAGNOSIS — Z959 Presence of cardiac and vascular implant and graft, unspecified: Secondary | ICD-10-CM

## 2016-06-01 DIAGNOSIS — I5022 Chronic systolic (congestive) heart failure: Secondary | ICD-10-CM

## 2016-06-01 DIAGNOSIS — I5042 Chronic combined systolic (congestive) and diastolic (congestive) heart failure: Secondary | ICD-10-CM

## 2016-06-01 DIAGNOSIS — Z961 Presence of intraocular lens: Secondary | ICD-10-CM | POA: Diagnosis not present

## 2016-06-01 DIAGNOSIS — I4892 Unspecified atrial flutter: Secondary | ICD-10-CM

## 2016-06-01 DIAGNOSIS — H401131 Primary open-angle glaucoma, bilateral, mild stage: Secondary | ICD-10-CM | POA: Diagnosis not present

## 2016-06-01 LAB — BASIC METABOLIC PANEL
BUN: 22 mg/dL (ref 7–25)
CALCIUM: 9.1 mg/dL (ref 8.6–10.3)
CO2: 30 mmol/L (ref 20–31)
CREATININE: 0.94 mg/dL (ref 0.70–1.11)
Chloride: 101 mmol/L (ref 98–110)
GLUCOSE: 92 mg/dL (ref 65–99)
Potassium: 3.8 mmol/L (ref 3.5–5.3)
Sodium: 139 mmol/L (ref 135–146)

## 2016-06-01 LAB — CUP PACEART INCLINIC DEVICE CHECK
Date Time Interrogation Session: 20171012104447
Implantable Lead Implant Date: 20170907
Implantable Lead Implant Date: 20170907
Implantable Lead Model: 4598
Implantable Lead Model: 5076
Implantable Lead Model: 5076
MDC IDC LEAD IMPLANT DT: 20170907
MDC IDC LEAD LOCATION: 753858
MDC IDC LEAD LOCATION: 753859
MDC IDC LEAD LOCATION: 753860

## 2016-06-01 LAB — BRAIN NATRIURETIC PEPTIDE: BRAIN NATRIURETIC PEPTIDE: 678.7 pg/mL — AB (ref <100)

## 2016-06-01 MED ORDER — METOPROLOL SUCCINATE ER 50 MG PO TB24: 50.0000 mg | ORAL_TABLET | Freq: Two times a day (BID) | ORAL | 11 refills | Status: DC

## 2016-06-01 NOTE — Progress Notes (Signed)
Cardiology Office Note:    Date:  06/01/2016   ID:  [name], DOB 09-Sep-1930, MRN [id number]  PCP:  [name], MD  Cardiologist:  Dr. Kate Sable [facility]) TAVR: Dr. [name]     Electrophysiologist:  Dr. [name]   Referring MD: [name], MD   Chief Complaint  Patient presents with  . Follow-up    CHF    History of Present Illness:    NAIEM EVERIST is a 82 y.o. male with a hx of CAD s/p CABG in 1995 and subsequent PCI to S-RPDA with a DES in 3/17, aortic stenosis, combined systolic and diastolic CHF, carotid artery disease s/p L CEA in 2009, HTN, HL, asymptomatic transient 2nd degree AVB (nocturnal), recurrent syncope s/p ILR.  He was ultimately evaluated by Dr. [name] in 8/17 due to symptoms of dyspnea on exertion and associated syncope felt to be related to severe AS.  His AS had previously been felt to be moderate.  He was set up for relook cardiac catheterization. Bypass grafts were patent but he had 70% in-stent restenosis in the SVG-RPDA. Hemodynamic measurements were consistent with moderate to severe aortic stenosis. There was concern for possible atrial tachycardia as well as frequent PVCs. TEE was obtained to evaluate his mitral valve and this demonstrated EF 45-50% with severe aortic stenosis with peak gradient 47 mmHg, mild mitral regurgitation. He was referred to Dr. Cyndia Bent for consideration of TAVR. However, he was admitted 9/5-9/8 with recurrent syncope.  Interrogation of his ILR demonstrated complete heart block. He underwent implantation of CRT-P with Dr. [name].  He was admitted 9/26-9/27 and underwent TAVR within [facility]ale's Sapien 3 THV via transfemoral approach. Postoperative echocardiogram demonstrated well-seated TAVR, EF 45-50%. Postoperative course was fairly uneventful. He did have mild thrombocytopenia which was expected post TAVR.  I saw him last week for post TAVR FU.  He was still shortness of breath and his weight was up 7 lbs.  His R leg was also red and swollen concerning for cellulitis.  I placed him on Keflex.  I increase his Lasix.  A FU echo was done and demonstrated stable EF at 40-45% and no pericardial effusion.  His TAVR was functioning normally.  He returns for close FU.  He also saw Chanetta Marshall, NP today for EP follow up. His device is only BiV pacing about 85% of the time due to PVCs.  He also had one episode of AFlutter.  She d/w Dr. [name] and he decided to hold off on anticoagulation for now as AFlutter may have been 2/2 to his TAVR. If he has recurrent AFlutter, he will need anticoagulation.  Mr. Kemmer is here with his wife today. He notes improved breathing. His LE edema is also improved.  He denies chest pain, syncope, orthopnea, PND.  He denies fevers or chills.  He has some dizziness related to vertigo.   Prior CV studies that were reviewed today include:    Echo 05/24/16 EF 40-45, severe HK of lat and inf-lat walls, TAVR ok, MAC, mild BAE, PASP 31  Limited Echo 05/17/16 Mild concentric LVH, EF 45-50%, inferior and inferolateral HK, TAVR okay with mean gradient 10 mmHg and peak 20 mmHg, MAC, mild MS, mild MR, mean MV gradient 5 mmHg, mild LAE   Carotid US 05/12/16 R 40-59%, L 1-39%   TEE 04/21/16 Mild LVH, EF 45-50%, inf and inf-lat HK, severe AS, AVA 0.8-0.9 cm, peak and mean 47 and 29 mmHg, Gr 2 atheroma of aortic  arch, mild MR, LA dilated   R/L Heart Cath 04/19/16 LM, LAD and RCA known to be occluded SVG-RPDA stent with 70% ISR SVG-OM1 stent patent with 20-25% ISR LIMA-LAD patent Aortic valve Peak to peak gradient 26 mmHg, mean 22 mmHg, CO 6.7, calculated AVA 1.25 cm >> after IV dobutamine: Peak to peak 26 mmHg, mean 25 mmHg, CO 3.9, AVA 0.82 cm 1. Severe native vessel coronary artery disease with total occlusion of the left main coronary artery and right coronary artery 2. Status post CABG with continued patency of the LIMA to LAD, saphenous vein graft to OM, and saphenous vein graft to  PDA with moderate in-stent restenosis in the PDA graft 3. Moderate to severe aortic stenosis with aortic valve calculations as outlined below 4. Large V waves raising suspicion for significant mitral regurgitation 5. Patient with narrow complex tachycardia heart rate approximately 110 120 bpm throughout the procedure, complicating hemodynamic assessment   LHC 11/17/15 Left Main Ost 100% Left Anterior Descending Ost to Prox 100% Left Circumflex Ost to Mid 100%, OM2 filled by collaterals from Lat 1st Mrg.  Right Coronary Artery Ost 100% Graft Angiography Free LIMA-LAD normal.  S-RPDA proximal 90%, distal 40% S-OM1 normal PCI:  STENT PROMUS PREM MR 4.0X12 to S-RPDA 1. Left main and RCA occlusive disease 2. Patent LIMA to the LAD 3. Patent SVG to the OM 4. Patent SVG to the PDA with severe stenosis in the proximal SVG 5. Moderate LV dysfunction with EF 35% 6. Moderate pulmonary HTN with elevated LV filling pressures. 7. Mild to moderate aortic stenosis. Mean gradient 18 mm Hg. AVA 1.8 cm squared. 8. Successful stenting of the SVG to the RCA with DES.   Past Medical History:  Diagnosis Date  . Arthritis   . Benign prostatic hypertrophy   . CAD (coronary artery disease)    DES, SVG to circumflex marginal, October, 2010 ( SVG to PDA and PLA patent , LIMA to LAD patent, EF 60%, mild inferior hypokinesis  . Complete heart block ([place])    a. s/p MDT CRTP 04/2016 Dr [name]  . GERD (gastroesophageal reflux disease)   . HTN (hypertension)   . Hyperlipidemia   . Hypothyroidism   . Nephrolithiasis   . Neuromuscular disorder ([place])   . Peripheral neuropathy ([place])   . Psoriasis    severe; with total skin exfoliation in the past resolved  . S/P TAVR (transcatheter aortic valve replacement)    severe AS >> s/p TAVR 9/17 // Limited Echo 10/17: EF 40-45%, lat and inf-lat HK, TAVR ok, MAC, mild BAE, PASP 31 mmHg, no effusion  . Ventricular tachycardia, non-sustained [facility])     Past Surgical  History:  Procedure Laterality Date  . APPENDECTOMY    . bilateral L3-4 laminectomies    . CARDIAC CATHETERIZATION N/A 11/17/2015   Procedure: Right/Left Heart Cath and Coronary Angiography;  Surgeon: Peter M Martinique, MD;  Location: [place] CV LAB;  Service: Cardiovascular;  Laterality: N/A;  . CARDIAC CATHETERIZATION N/A 11/17/2015   Procedure: Coronary Stent Intervention;  Surgeon: Peter M Martinique, MD;  Location: [place] CV LAB;  Service: Cardiovascular;  Laterality: N/A;  . CARDIAC CATHETERIZATION N/A 04/19/2016   Procedure: Right/Left Heart Cath and Coronary/Graft Angiography;  Surgeon: [name], MD;  Location: [place] CV LAB;  Service: Cardiovascular;  Laterality: N/A;  . CAROTID ENDARTERECTOMY Left 02-24-08   cea  Dr. Amedeo Plenty  . CARPAL TUNNEL RELEASE Bilateral 2010  . CATARACT EXTRACTION, BILATERAL    . COLONOSCOPY  11/23/2011   Procedure: COLONOSCOPY;  Surgeon: Rogene Houston, MD;  [facility];  Service: Endoscopy;  Laterality: N/A;  730  . CORONARY ANGIOPLASTY     2 stents  . CORONARY ARTERY BYPASS GRAFT  1995   x 3  . CORONARY STENT PLACEMENT  11/17/2015   SVG   DES to RCA  . decompression of left median nerve    . EP IMPLANTABLE DEVICE N/A 04/27/2016   MDT CRTP implanted by Dr [name]  . JOINT REPLACEMENT Left Sept. 27, 2013   Knee  . left carotid endarectomy and Dacron patch angioplasty]    . LEFT HEART CATHETERIZATION WITH CORONARY ANGIOGRAM N/A 12/27/2012   Procedure: LEFT HEART CATHETERIZATION WITH CORONARY ANGIOGRAM;  Surgeon: Burnell Blanks, MD;  Location: [facility] CATH LAB;  Service: Cardiovascular;  Laterality: N/A;  . posterior arthrodesis with autograft and allograft    . removal of synovial cyst    . TEE WITHOUT CARDIOVERSION N/A 04/21/2016   Procedure: TRANSESOPHAGEAL ECHOCARDIOGRAM (TEE);  Surgeon: Pixie Casino, MD;  Location: [facility] ENDOSCOPY;  Service: Cardiovascular;  Laterality: N/A;  . TEE WITHOUT CARDIOVERSION N/A 05/16/2016   Procedure:  TRANSESOPHAGEAL ECHOCARDIOGRAM (TEE);  Surgeon: [name], MD;  Location: [place];  Service: Open Heart Surgery;  Laterality: N/A;  . TONSILLECTOMY    . TOTAL KNEE ARTHROPLASTY  04/16/2012   Procedure: TOTAL KNEE ARTHROPLASTY;  Surgeon: Garald Balding, MD;  Location: [place];  Service: Orthopedics;  Laterality: Left;  . TRANSCATHETER AORTIC VALVE REPLACEMENT, TRANSFEMORAL N/A 05/16/2016   Procedure: TRANSCATHETER AORTIC VALVE REPLACEMENT, TRANSFEMORAL;  Surgeon: [name], MD;  Location: [place];  Service: Open Heart Surgery;  Laterality: N/A;    Current Medications: Current Meds  Medication Sig  . allopurinol (ZYLOPRIM) 300 MG tablet Take 600 mg by mouth daily.   Marland Kitchen amoxicillin (AMOXIL) 500 MG tablet Take 4 tablets (2,000 mg total) by mouth as needed. Take 4 of the 500 mg tablets = 2000 mg 60 minutes before dental procedure  . aspirin EC 81 MG tablet Take 81 mg by mouth daily.  Marland Kitchen atorvastatin (LIPITOR) 20 MG tablet Take 20 mg by mouth every evening.   . clobetasol cream (TEMOVATE) [id number] % Apply 1 application topically 2 (two) times daily as needed (SKIN).   Marland Kitchen clopidogrel (PLAVIX) 75 MG tablet Take 1 tablet (75 mg total) by mouth daily with breakfast.  . Docusate Sodium (COLACE PO) Take 2 tablets by mouth daily.  . fluticasone (FLONASE) 50 MCG/ACT nasal spray Place 1 spray into both nostrils 2 (two) times daily.  . furosemide (LASIX) 40 MG tablet Take 1 tablet (40 mg total) by mouth daily.  Marland Kitchen latanoprost (XALATAN) 0.005 % ophthalmic solution Place 1 drop into both eyes at bedtime.  Marland Kitchen levothyroxine (SYNTHROID, LEVOTHROID) 112 MCG tablet Take 112 mcg by mouth daily before breakfast.  . metoprolol succinate (TOPROL-XL) 50 MG 24 hr tablet Take 1 tablet (50 mg total) by mouth 2 (two) times daily. Take with or immediately following a meal.  . multivitamin (THERAGRAN) per tablet Take 1 tablet by mouth daily.   . nitroGLYCERIN (NITROSTAT) 0.4 MG SL tablet Place 0.4 mg under the tongue every 5  (five) minutes x 3 doses as needed. For chest pain  . potassium chloride SA (K-DUR,KLOR-CON) 20 MEQ tablet Take 1 tablet (20 mEq total) by mouth as directed. Take 20 meq twice daily for 2 days; then change to 20 meq once a day  . pyridOXINE (VITAMIN B-6) 100 MG tablet  Take 100 mg by mouth daily.   . Tamsulosin HCl (FLOMAX) 0.4 MG CAPS Take 0.4 mg by mouth daily.   . valsartan (DIOVAN) 80 MG tablet Take 1 tablet (80 mg total) by mouth daily.  . [DISCONTINUED] metoprolol succinate (TOPROL-XL) 50 MG 24 hr tablet Take 1 tablet (50 mg total) by mouth daily. Take with or immediately following a meal.     Allergies:   Propylene glycol   Social History   Social History  . Marital status: Married    Spouse name: N/A  . Number of children: 2  . Years of education: 16   Occupational History  . Retired    Social History Main Topics  . Smoking status: Never Smoker  . Smokeless tobacco: Never Used  . Alcohol use No     Comment: One beer per week.  . Drug use: No  . Sexual activity: Not Currently   Other Topics Concern  . None   Social History Narrative   Lives at home with wife.   1 cup coffee per day.   Right-handed.     Family History:  The patient's family history includes Heart attack in his mother; Heart attack (age of onset: 5) in his son; Heart disease in his father and mother; Hyperlipidemia in his father; Hypertension in his father.   ROS:   Please see the history of present illness.    Review of Systems  Cardiovascular: Positive for dyspnea on exertion and leg swelling.  Hematologic/Lymphatic: Bruises/bleeds easily.   All other systems reviewed and are negative.   EKGs/Labs/Other Test Reviewed:    EKG:  EKG is  ordered today.  The ekg ordered today demonstrates NSR, BiV pacing, HR 77, PVCs.  Recent Labs: 05/12/2016: ALT 38 05/17/2016: Magnesium 1.8 05/24/2016: Brain Natriuretic Peptide 642.3; BUN 24; Creat 0.93; Hemoglobin 9.5; Platelets 126; Potassium 3.9; Sodium 138    Recent Lipid Panel    Component Value Date/Time   CHOL 143 08/06/2006 0814   TRIG 141 08/06/2006 0814   HDL 29.7 (L) 08/06/2006 0814   CHOLHDL 4.8 CALC 08/06/2006 0814   VLDL 28 08/06/2006 0814   LDLCALC 85 08/06/2006 0814     Physical Exam:    VS:  BP 140/60   Pulse 79   Ht 5\' 8"  (1.727 m)   Wt 180 lb 1.9 oz (81.7 kg)   BMI 27.39 kg/m     Wt Readings from Last 3 Encounters:  06/01/16 180 lb 1.9 oz (81.7 kg)  06/01/16 180 lb (81.6 kg)  05/24/16 185 lb 1.9 oz (84 kg)     Physical Exam  Constitutional: He is oriented to person, place, and time. He appears well-developed and well-nourished. No distress.  HENT:  Head: Normocephalic and atraumatic.  Eyes: No scleral icterus.  Neck: JVD present.  JVP 7 cm  Cardiovascular: Normal rate, regular rhythm, S1 normal and S2 normal.  Exam reveals friction rub.   Murmur heard.  Low-pitched systolic murmur is present with a grade of 2/6  at the upper right sternal border Pulmonary/Chest: Effort normal. He has no wheezes. He has no rales.  Abdominal: Soft. There is no tenderness.  Musculoskeletal: He exhibits edema.  1+ bilat LE edema; R > L  Neurological: He is alert and oriented to person, place, and time.  Skin:  Much less redness RLE with some scaling noted  Psychiatric: He has a normal mood and affect.    ASSESSMENT:    1. Chronic combined systolic and diastolic congestive heart  failure ([place])   2. S/P TAVR (transcatheter aortic valve replacement)   3. Cellulitis of right lower extremity   4. Coronary artery disease involving coronary bypass graft of native heart without angina pectoris   5. Cardiac device in situ   6. Essential hypertension   7. Paroxysmal atrial flutter (H[facility])    PLAN:    In order of problems listed above:  1. Chronic combined systolic and diastolic CHF - Volume is improved.  He is down 5 lbs here and 7 lbs at home. He is ready to increase his activity. I have asked him to proceed slowly.  He  probably still has some fluid to lose.  But, we will also increase his beta-blocker to help him increase his BiV pacing. This should help as well.    -  Continue Lasix 40 QD for now  -  BMET, BNP today (consider Lasix 60 QD if BNP significantly elevated)  -  Increase Toprol-XL to 50 mg bid to help reduce PVCs and increase BiV pacing.  -  Continue angiotensin receptor blocker.   -  Keep FU with Dr. [name] in 3 weeks.  2. S/p TAVR - Recent echo ok with normally functioning AVR and no pericardial effusion.  Continue SBE prophylaxis and FU with Dr. [name].     3. Cellulitis - Resolved.  He has finished antibiotics.    4. CAD - s/p CABG in 1995 and DES to S-RPDA in 3/17.  LHC in 8/17 with S-RPDA 70% ISR.  No chest pain.  Continue ASA, Plavix, beta-blocker, statin.  5. S/p PPM - Device today showed a lot of PVCs and reduced BiV pacing. As noted, beta-blocker dose is increased today to allow greater BiV pacing. FU with EP as planned.  6. HTN - BP with good control.   7. Parox AFlutter - As noted, this was previously reviewed with Chanetta Marshall, NP and Dr. [name].  It is felt that his AFlutter may be related to TAVR and the decision was made to hold off on anticoagulation for now.  CHADS2-VASc=5.  If he has more Aflutter, he will need anticoagulation started (we would have to stop either ASA or Plavix if anticoagulation started).     Medication Adjustments/Labs and Tests Ordered: Current medicines are reviewed at length with the patient today.  Concerns regarding medicines are outlined above.  Medication changes, Labs and Tests ordered today are outlined in the Patient Instructions noted below. Patient Instructions  Medication Instructions:  1) INCREASE TOPROL to 50 mg TWICE DAILY  Labwork: TODAY: BMET, BNP  Testing/Procedures: None  Follow-Up: Please keep your follow-up appointment with Dr. Burt Knack Thursday, June 22, 2016 at 10:30AM.  Any Other Special Instructions  Will Be Listed Below (If Applicable).  If you need a refill on your cardiac medications before your next appointment, please call your pharmacy.  Signed, [name], PA-C  06/01/2016 11:06 AM    [facility] [address], [place], [place]  [address] Phone: [phone]; Fax: [phone]

## 2016-06-01 NOTE — Patient Instructions (Addendum)
Medication Instructions:   Your physician recommends that you continue on your current medications as directed. Please refer to the Current Medication list given to you today.   If you need a refill on your cardiac medications before your next appointment, please call your pharmacy.  Labwork: NONE ORDER TODAY    Testing/Procedures: NONE ORDER TODAY   Follow-Up:  IN 2 WEEKS  WITH  AMBER SEILER OR SCOTT WEAVER    Any Other Special Instructions Will Be Listed Below (If Applicable).

## 2016-06-01 NOTE — Patient Instructions (Addendum)
Medication Instructions:  1) INCREASE TOPROL to 50 mg TWICE DAILY  Labwork: TODAY: BMET, BNP  Testing/Procedures: None  Follow-Up: Please keep your follow-up appointment with Dr. Burt Knack Thursday, June 22, 2016 at 10:30AM.  Any Other Special Instructions Will Be Listed Below (If Applicable).  If you need a refill on your cardiac medications before your next appointment, please call your pharmacy.

## 2016-06-02 ENCOUNTER — Other Ambulatory Visit: Payer: Self-pay | Admitting: *Deleted

## 2016-06-02 DIAGNOSIS — I5043 Acute on chronic combined systolic (congestive) and diastolic (congestive) heart failure: Secondary | ICD-10-CM

## 2016-06-02 MED ORDER — FUROSEMIDE 40 MG PO TABS: 60.0000 mg | ORAL_TABLET | Freq: Every day | ORAL | 3 refills | Status: DC

## 2016-06-02 MED ORDER — POTASSIUM CHLORIDE CRYS ER 20 MEQ PO TBCR: 30.0000 meq | EXTENDED_RELEASE_TABLET | Freq: Every day | ORAL | 3 refills | Status: DC

## 2016-06-09 ENCOUNTER — Telehealth: Payer: Self-pay | Admitting: *Deleted

## 2016-06-09 ENCOUNTER — Other Ambulatory Visit: Payer: Medicare HMO | Admitting: *Deleted

## 2016-06-09 DIAGNOSIS — I5043 Acute on chronic combined systolic (congestive) and diastolic (congestive) heart failure: Secondary | ICD-10-CM | POA: Diagnosis not present

## 2016-06-09 DIAGNOSIS — I10 Essential (primary) hypertension: Secondary | ICD-10-CM

## 2016-06-09 LAB — BASIC METABOLIC PANEL
BUN: 28 mg/dL — AB (ref 7–25)
CHLORIDE: 99 mmol/L (ref 98–110)
CO2: 28 mmol/L (ref 20–31)
CREATININE: 1.04 mg/dL (ref 0.70–1.11)
Calcium: 9.4 mg/dL (ref 8.6–10.3)
Glucose, Bld: 105 mg/dL — ABNORMAL HIGH (ref 65–99)
Potassium: 4 mmol/L (ref 3.5–5.3)
Sodium: 135 mmol/L (ref 135–146)

## 2016-06-09 NOTE — Telephone Encounter (Signed)
Lmtcb to go over lab results and recommendations.  

## 2016-06-12 NOTE — Telephone Encounter (Signed)
Follow up ° ° ° ° ° °Returning a call to get lab results °

## 2016-06-12 NOTE — Telephone Encounter (Signed)
Pt notified of lab results and findings by phone with verbal understanding. Pt agreeable to repeat bmet in 1 week. Pt would like to have this done on 11/2 when he comes in to see Dr. Burt Knack and his echo that day. BMET ordered for 06/22/16.

## 2016-06-20 DIAGNOSIS — I10 Essential (primary) hypertension: Secondary | ICD-10-CM | POA: Diagnosis not present

## 2016-06-20 DIAGNOSIS — E7800 Pure hypercholesterolemia, unspecified: Secondary | ICD-10-CM | POA: Diagnosis not present

## 2016-06-20 DIAGNOSIS — E039 Hypothyroidism, unspecified: Secondary | ICD-10-CM | POA: Diagnosis not present

## 2016-06-20 DIAGNOSIS — Z0000 Encounter for general adult medical examination without abnormal findings: Secondary | ICD-10-CM | POA: Diagnosis not present

## 2016-06-20 DIAGNOSIS — I2510 Atherosclerotic heart disease of native coronary artery without angina pectoris: Secondary | ICD-10-CM | POA: Diagnosis not present

## 2016-06-20 DIAGNOSIS — Z6825 Body mass index (BMI) 25.0-25.9, adult: Secondary | ICD-10-CM | POA: Diagnosis not present

## 2016-06-22 ENCOUNTER — Other Ambulatory Visit: Payer: Self-pay

## 2016-06-22 ENCOUNTER — Ambulatory Visit (INDEPENDENT_AMBULATORY_CARE_PROVIDER_SITE_OTHER): Payer: Medicare HMO | Admitting: Cardiovascular Disease

## 2016-06-22 ENCOUNTER — Encounter: Payer: Self-pay | Admitting: Cardiovascular Disease

## 2016-06-22 ENCOUNTER — Other Ambulatory Visit: Payer: Medicare HMO | Admitting: *Deleted

## 2016-06-22 ENCOUNTER — Ambulatory Visit (HOSPITAL_COMMUNITY): Payer: Medicare HMO | Attending: Cardiology

## 2016-06-22 VITALS — BP 128/60 | HR 64 | Ht 68.0 in | Wt 180.0 lb

## 2016-06-22 DIAGNOSIS — Z952 Presence of prosthetic heart valve: Secondary | ICD-10-CM

## 2016-06-22 DIAGNOSIS — I5030 Unspecified diastolic (congestive) heart failure: Secondary | ICD-10-CM | POA: Diagnosis not present

## 2016-06-22 DIAGNOSIS — I517 Cardiomegaly: Secondary | ICD-10-CM | POA: Diagnosis not present

## 2016-06-22 DIAGNOSIS — I272 Pulmonary hypertension, unspecified: Secondary | ICD-10-CM | POA: Diagnosis not present

## 2016-06-22 DIAGNOSIS — I350 Nonrheumatic aortic (valve) stenosis: Secondary | ICD-10-CM | POA: Diagnosis not present

## 2016-06-22 DIAGNOSIS — I10 Essential (primary) hypertension: Secondary | ICD-10-CM

## 2016-06-22 LAB — BASIC METABOLIC PANEL
BUN: 27 mg/dL — AB (ref 7–25)
CALCIUM: 9.5 mg/dL (ref 8.6–10.3)
CO2: 26 mmol/L (ref 20–31)
CREATININE: 1.12 mg/dL — AB (ref 0.70–1.11)
Chloride: 101 mmol/L (ref 98–110)
Glucose, Bld: 102 mg/dL — ABNORMAL HIGH (ref 65–99)
Potassium: 4.2 mmol/L (ref 3.5–5.3)
Sodium: 139 mmol/L (ref 135–146)

## 2016-06-22 MED ORDER — METOPROLOL SUCCINATE ER 25 MG PO TB24: 12.5000 mg | ORAL_TABLET | Freq: Two times a day (BID) | ORAL | 11 refills | Status: DC

## 2016-06-22 MED ORDER — METOPROLOL SUCCINATE ER 50 MG PO TB24: ORAL_TABLET | ORAL | 3 refills | Status: DC

## 2016-06-22 NOTE — Progress Notes (Signed)
Cardiology Office Note Date:  06/23/2016   ID:  [name], DOB [birth year], MRN [id number]  PCP:  [name], MD  Cardiologist:  [name], MD    Chief Complaint  Patient presents with  . Aortic Stenosis   History of Present Illness: [name] is a 82 y.o. male who presents for 30 day follow-up after undergoing TAVR 05/16/2016. He was treated with a 29 mm Edwards Sapien 3 transcatheter heart valve via a percutaneous right femoral approach. He initially presented with exertional dyspnea and syncope. He ultimately was found to have severe aortic stenosis as well as complete AV block. He underwent PPM placement prior to undergoing TAVR.  He has had no recurrence of lightheadedness or syncope. Continues to have exertional dyspnea with certain activities but feels breathing is improved since undergoing TAVR. No orthopnea, PND, or resting dyspnea. He thinks he has been more fatigued since his metoprolol dose was increased.  Past Medical History:  Diagnosis Date  . Arthritis   . Benign prostatic hypertrophy   . CAD (coronary artery disease)    DES, SVG to circumflex marginal, October, 2010 ( SVG to PDA and PLA patent , LIMA to LAD patent, EF 60%, mild inferior hypokinesis  . Complete heart block ([name])    a. s/p MDT CRTP 04/2016 Dr [name]  . GERD (gastroesophageal reflux disease)   . HTN (hypertension)   . Hyperlipidemia   . Hypothyroidism   . Nephrolithiasis   . Neuromuscular disorder ([place])   . Peripheral neuropathy ([place])   . Psoriasis    severe; with total skin exfoliation in the past resolved  . S/P TAVR (transcatheter aortic valve replacement)    severe AS >> s/p TAVR 9/17 // Limited Echo 10/17: EF 40-45%, lat and inf-lat HK, TAVR ok, MAC, mild BAE, PASP 31 mmHg, no effusion  . Ventricular tachycardia, non-sustained [facility])     Past Surgical History:  Procedure Laterality Date  . APPENDECTOMY    . bilateral L3-4 laminectomies    . CARDIAC CATHETERIZATION N/A  11/17/2015   Procedure: Right/Left Heart Cath and Coronary Angiography;  Surgeon: Peter M Martinique, MD;  Location: [place] CV LAB;  Service: Cardiovascular;  Laterality: N/A;  . CARDIAC CATHETERIZATION N/A 11/17/2015   Procedure: Coronary Stent Intervention;  Surgeon: Peter M Martinique, MD;  Location: [place] CV LAB;  Service: Cardiovascular;  Laterality: N/A;  . CARDIAC CATHETERIZATION N/A 04/19/2016   Procedure: Right/Left Heart Cath and Coronary/Graft Angiography;  Surgeon: [name], MD;  Location: [place] CV LAB;  Service: Cardiovascular;  Laterality: N/A;  . CAROTID ENDARTERECTOMY Left 02-24-08   cea  Dr. Amedeo Plenty  . CARPAL TUNNEL RELEASE Bilateral 2010  . CATARACT EXTRACTION, BILATERAL    . COLONOSCOPY  11/23/2011   Procedure: COLONOSCOPY;  Surgeon: Rogene Houston, MD;  [facility];  Service: Endoscopy;  Laterality: N/A;  730  . CORONARY ANGIOPLASTY     2 stents  . CORONARY ARTERY BYPASS GRAFT  1995   x 3  . CORONARY STENT PLACEMENT  11/17/2015   SVG   DES to RCA  . decompression of left median nerve    . EP IMPLANTABLE DEVICE N/A 04/27/2016   MDT CRTP implanted by Dr [name]  . JOINT REPLACEMENT Left Sept. 27, 2013   Knee  . left carotid endarectomy and Dacron patch angioplasty]    . LEFT HEART CATHETERIZATION WITH CORONARY ANGIOGRAM N/A 12/27/2012   Procedure: LEFT HEART CATHETERIZATION WITH CORONARY ANGIOGRAM;  Surgeon: Annita Brod  Angelena Form, MD;  Location: [place] CATH LAB;  Service: Cardiovascular;  Laterality: N/A;  . posterior arthrodesis with autograft and allograft    . removal of synovial cyst    . TEE WITHOUT CARDIOVERSION N/A 04/21/2016   Procedure: TRANSESOPHAGEAL ECHOCARDIOGRAM (TEE);  Surgeon: Pixie Casino, MD;  Location: [facility] ENDOSCOPY;  Service: Cardiovascular;  Laterality: N/A;  . TEE WITHOUT CARDIOVERSION N/A 05/16/2016   Procedure: TRANSESOPHAGEAL ECHOCARDIOGRAM (TEE);  Surgeon: [name], MD;  Location: [place];  Service: Open Heart Surgery;   Laterality: N/A;  . TONSILLECTOMY    . TOTAL KNEE ARTHROPLASTY  04/16/2012   Procedure: TOTAL KNEE ARTHROPLASTY;  Surgeon: Garald Balding, MD;  Location: [place];  Service: Orthopedics;  Laterality: Left;  . TRANSCATHETER AORTIC VALVE REPLACEMENT, TRANSFEMORAL N/A 05/16/2016   Procedure: TRANSCATHETER AORTIC VALVE REPLACEMENT, TRANSFEMORAL;  Surgeon: [name], MD;  Location: [place];  Service: Open Heart Surgery;  Laterality: N/A;    Current Outpatient Prescriptions  Medication Sig Dispense Refill  . allopurinol (ZYLOPRIM) 300 MG tablet Take 600 mg by mouth daily.     Marland Kitchen amoxicillin (AMOXIL) 500 MG tablet Take 4 tablets (2,000 mg total) by mouth as needed. Take 4 of the 500 mg tablets = 2000 mg 60 minutes before dental procedure 4 tablet 2  . aspirin EC 81 MG tablet Take 81 mg by mouth daily.    Marland Kitchen atorvastatin (LIPITOR) 20 MG tablet Take 20 mg by mouth every evening.     . clobetasol cream (TEMOVATE) [id number] % Apply 1 application topically 2 (two) times daily as needed (SKIN).     Marland Kitchen clopidogrel (PLAVIX) 75 MG tablet Take 1 tablet (75 mg total) by mouth daily with breakfast. 90 tablet 3  . Docusate Sodium (COLACE PO) Take 2 tablets by mouth daily.    . fluticasone (FLONASE) 50 MCG/ACT nasal spray Place 1 spray into both nostrils 2 (two) times daily.    . furosemide (LASIX) 40 MG tablet Take 1.5 tablets (60 mg total) by mouth daily. 90 tablet 3  . latanoprost (XALATAN) 0.005 % ophthalmic solution Place 1 drop into both eyes at bedtime.    Marland Kitchen levothyroxine (SYNTHROID, LEVOTHROID) 112 MCG tablet Take 112 mcg by mouth daily before breakfast.    . multivitamin (THERAGRAN) per tablet Take 1 tablet by mouth daily.     . nitroGLYCERIN (NITROSTAT) 0.4 MG SL tablet Place 0.4 mg under the tongue every 5 (five) minutes x 3 doses as needed. For chest pain    . potassium chloride SA (K-DUR,KLOR-CON) 20 MEQ tablet Take 1.5 tablets (30 mEq total) by mouth daily. 90 tablet 3  . pyridOXINE (VITAMIN B-6) 100 MG  tablet Take 100 mg by mouth daily.     . Tamsulosin HCl (FLOMAX) 0.4 MG CAPS Take 0.4 mg by mouth daily.     . valsartan (DIOVAN) 80 MG tablet Take 1 tablet (80 mg total) by mouth daily. 30 tablet 6  . metoprolol succinate (TOPROL-XL) 50 MG 24 hr tablet Take one-half tablet by mouth twice a day. Take with or immediately following a meal. 90 tablet 3   No current facility-administered medications for this visit.     Allergies:   Propylene glycol   Social History:  The patient  reports that he has never smoked. He has never used smokeless tobacco. He reports that he does not drink alcohol or use drugs.   Family History:  The patient's  family history includes Heart attack in his mother; Heart attack (age of onset:  17) in his son; Heart disease in his father and mother; Hyperlipidemia in his father; Hypertension in his father.   ROS:  Please see the history of present illness.  Otherwise, review of systems is positive for easy bruising, leg swelling.  All other systems are reviewed and negative.   PHYSICAL EXAM: VS:  BP 128/60   Pulse 64   Ht 5\' 8"  (1.727 m)   Wt 81.6 kg (180 lb)   BMI 27.37 kg/m  , BMI Body mass index is 27.37 kg/m. GEN: Well nourished, well developed, in no acute distress  HEENT: normal  Neck: no JVD, no masses.  Cardiac: RRR with soft systolic murmur at the RUSB, no diastolic murmur                Respiratory:  clear to auscultation bilaterally, normal work of breathing GI: soft, nontender, nondistended, + BS MS: no deformity or atrophy  Ext: no pretibial edema, pedal pulses 2+= bilaterally, multiple ecchymotic areas, left arm Skin: warm and dry, no rash Neuro:  Strength and sensation are intact Psych: euthymic mood, full affect  EKG:  EKG is not ordered today.  Recent Labs: 05/12/2016: ALT 38 05/17/2016: Magnesium 1.8 05/24/2016: Hemoglobin 9.5; Platelets 126 06/01/2016: Brain Natriuretic Peptide 678.7 06/22/2016: BUN 27; Creat 1.12; Potassium 4.2; Sodium 139     Lipid Panel     Component Value Date/Time   CHOL 143 08/06/2006 0814   TRIG 141 08/06/2006 0814   HDL 29.7 (L) 08/06/2006 0814   CHOLHDL 4.8 CALC 08/06/2006 0814   VLDL 28 08/06/2006 0814   LDLCALC 85 08/06/2006 0814      Wt Readings from Last 3 Encounters:  06/22/16 81.6 kg (180 lb)  06/01/16 81.7 kg (180 lb 1.9 oz)  06/01/16 81.6 kg (180 lb)     Cardiac Studies Reviewed: Today's echocardiogram is reviewed. Formal interpretation is pending. The patient's aortic valve prosthesis appears to have normal function with peak and mean gradients of 15 and 8 mmHg, respectively. I do not appreciate any paravalvular regurgitation.  ASSESSMENT AND PLAN: 1.  Aortic valve disease status post TAVR: NYHA functional class II symptoms. Review of his echo images demonstrates normal bioprosthetic aortic valve function. He will return for a follow-up echocardiogram and valve clinic visit at one year. SBE prophylaxis reviewed with the patient and his wife today.  2. Complete heart block home status post permanent pacemaker: Patient without recurrent syncope since pacemaker placement. Will reduce his metoprolol dose by 50% secondary to fatigue.  3. CAD status post CABG: At least moderate in-stent restenosis in the saphenous vein graft RCA. The patient has no anginal symptoms. We discussed options of ongoing medical therapy versus PCI. We'll continue with medical therapy for now.  4. Essential hypertension: Blood pressure well controlled  Current medicines are reviewed with the patient today.  The patient does not have concerns regarding medicines.  Labs/ tests ordered today include:   Orders Placed This Encounter  Procedures  . ECHOCARDIOGRAM COMPLETE   Disposition:   FU one year with Valve Clinic appt and echocardiogram.   Signed, [name], MD  06/23/2016 11:52 AM    [facility] [address], [place], [place]  [address] Phone: [phone]; Fax: [phone]

## 2016-06-22 NOTE — Patient Instructions (Addendum)
Medication Instructions:  Your physician has recommended you make the following change in your medication:  1. DECREASE Metoprolol Succinate to 50mg  take one-half tablet by mouth twice a day  Labwork: No new orders.   Testing/Procedures: Your physician has requested that you have an echocardiogram in 1 YEAR. Echocardiography is a painless test that uses sound waves to create images of your heart. It provides your doctor with information about the size and shape of your heart and how well your heart's chambers and valves are working. This procedure takes approximately one hour. There are no restrictions for this procedure.  Follow-Up: Your physician wants you to follow-up in: 1 YEAR with Dr Burt Knack.  You will receive a reminder letter in the mail two months in advance. If you don't receive a letter, please call our office to schedule the follow-up appointment.   Any Other Special Instructions Will Be Listed Below (If Applicable).     If you need a refill on your cardiac medications before your next appointment, please call your pharmacy.

## 2016-06-23 ENCOUNTER — Telehealth: Payer: Self-pay | Admitting: *Deleted

## 2016-06-23 NOTE — Telephone Encounter (Signed)
Pt notified of lab results by phone with verbal understanding.  

## 2016-06-23 NOTE — Telephone Encounter (Signed)
Lmtcb to go over lab results 

## 2016-06-26 DIAGNOSIS — M720 Palmar fascial fibromatosis [Dupuytren]: Secondary | ICD-10-CM | POA: Diagnosis not present

## 2016-06-28 ENCOUNTER — Encounter: Payer: Self-pay | Admitting: Family

## 2016-07-03 ENCOUNTER — Inpatient Hospital Stay (HOSPITAL_COMMUNITY)
Admission: RE | Admit: 2016-07-03 | Discharge: 2016-07-03 | Disposition: A | Discharge status: Home / Self Care | Payer: Self-pay | Source: Ambulatory Visit | Attending: Family | Admitting: Family

## 2016-07-03 ENCOUNTER — Ambulatory Visit: Payer: Self-pay | Admitting: Family

## 2016-07-03 DIAGNOSIS — Z9889 Other specified postprocedural states: Secondary | ICD-10-CM

## 2016-07-03 DIAGNOSIS — Z48812 Encounter for surgical aftercare following surgery on the circulatory system: Secondary | ICD-10-CM

## 2016-07-31 ENCOUNTER — Encounter: Payer: Self-pay | Admitting: Internal Medicine

## 2016-07-31 ENCOUNTER — Ambulatory Visit (INDEPENDENT_AMBULATORY_CARE_PROVIDER_SITE_OTHER): Payer: Medicare HMO | Admitting: Internal Medicine

## 2016-07-31 VITALS — BP 142/46 | HR 83 | Ht 68.0 in | Wt 177.4 lb

## 2016-07-31 DIAGNOSIS — I5042 Chronic combined systolic (congestive) and diastolic (congestive) heart failure: Secondary | ICD-10-CM

## 2016-07-31 DIAGNOSIS — R001 Bradycardia, unspecified: Secondary | ICD-10-CM

## 2016-07-31 DIAGNOSIS — I441 Atrioventricular block, second degree: Secondary | ICD-10-CM

## 2016-07-31 DIAGNOSIS — Z952 Presence of prosthetic heart valve: Secondary | ICD-10-CM | POA: Diagnosis not present

## 2016-07-31 DIAGNOSIS — Z950 Presence of cardiac pacemaker: Secondary | ICD-10-CM

## 2016-07-31 LAB — CUP PACEART INCLINIC DEVICE CHECK
Battery Voltage: 3.14 V
Brady Statistic AS VP Percent: 74.04 %
Brady Statistic RA Percent Paced: 16.03 %
Brady Statistic RV Percent Paced: 86.61 %
Date Time Interrogation Session: 20171211161942
Implantable Lead Implant Date: 20170907
Implantable Lead Location: 753859
Implantable Lead Location: 753860
Implantable Lead Model: 4598
Implantable Pulse Generator Implant Date: 20170907
Lead Channel Impedance Value: 418 Ohm
Lead Channel Impedance Value: 646 Ohm
Lead Channel Impedance Value: 741 Ohm
Lead Channel Impedance Value: 779 Ohm
Lead Channel Impedance Value: 798 Ohm
Lead Channel Pacing Threshold Amplitude: 0.75 V
Lead Channel Pacing Threshold Pulse Width: 0.4 ms
Lead Channel Pacing Threshold Pulse Width: 0.8 ms
Lead Channel Sensing Intrinsic Amplitude: 3.5 mV
Lead Channel Sensing Intrinsic Amplitude: 4.75 mV
Lead Channel Setting Pacing Amplitude: 2 V
Lead Channel Setting Pacing Amplitude: 2.5 V
Lead Channel Setting Pacing Pulse Width: 0.4 ms
Lead Channel Setting Sensing Sensitivity: 0.9 mV
MDC IDC LEAD IMPLANT DT: 20170907
MDC IDC LEAD IMPLANT DT: 20170907
MDC IDC LEAD LOCATION: 753858
MDC IDC MSMT BATTERY REMAINING LONGEVITY: 99 mo
MDC IDC MSMT LEADCHNL LV IMPEDANCE VALUE: 494 Ohm
MDC IDC MSMT LEADCHNL LV IMPEDANCE VALUE: 665 Ohm
MDC IDC MSMT LEADCHNL LV PACING THRESHOLD AMPLITUDE: 2.125 V
MDC IDC MSMT LEADCHNL RA IMPEDANCE VALUE: 285 Ohm
MDC IDC MSMT LEADCHNL RA IMPEDANCE VALUE: 437 Ohm
MDC IDC MSMT LEADCHNL RV IMPEDANCE VALUE: 323 Ohm
MDC IDC MSMT LEADCHNL RV PACING THRESHOLD AMPLITUDE: 1.125 V
MDC IDC MSMT LEADCHNL RV PACING THRESHOLD PULSEWIDTH: 0.4 ms
MDC IDC MSMT LEADCHNL RV SENSING INTR AMPL: 11.5 mV
MDC IDC MSMT LEADCHNL RV SENSING INTR AMPL: 13.625 mV
MDC IDC SET LEADCHNL LV PACING AMPLITUDE: 3.25 V
MDC IDC SET LEADCHNL LV PACING PULSEWIDTH: 0.8 ms
MDC IDC STAT BRADY AP VP PERCENT: 12.61 %
MDC IDC STAT BRADY AP VS PERCENT: 0.06 %
MDC IDC STAT BRADY AS VS PERCENT: 13.29 %

## 2016-07-31 NOTE — Patient Instructions (Addendum)
Medication Instructions:  Your physician recommends that you continue on your current medications as directed. Please refer to the Current Medication list given to you today.   Labwork: None Ordered   Testing/Procedures: None Ordered   Follow-Up: Your physician wants you to follow-up in: 6 months with Dr. [name]. You will receive a reminder letter in the mail two months in advance. If you don't receive a letter, please call our office to schedule the follow-up appointment.  Remote monitoring is used to monitor your Pacemaker from home. This monitoring reduces the number of office visits required to check your device to one time per year. It allows [place] to keep an eye on the functioning of your device to ensure it is working properly. You are scheduled for a device check from home on 10/30/16. You may send your transmission at any time that day. If you have a wireless device, the transmission will be sent automatically. After your physician reviews your transmission, you will receive a postcard with your next transmission date.    Any Other Special Instructions Will Be Listed Below (If Applicable).     If you need a refill on your cardiac medications before your next appointment, please call your pharmacy.

## 2016-07-31 NOTE — Progress Notes (Signed)
PCP: [name], MD Primary Cardiologist:  Dr Bronson Ing (previously Dr Ron Parker)  [name] is a 82 y.o. male who presents today for electrophysiology followup.  Since last being seen in our clinic, the patient reports doing very well.  Since recently reducing his beta blocker, his SOB has resolved.  He states "I feel better than I have felt in years".  He is very pleased with current health.  He is planning to go to [place] in January and will be there until April.  He is looking forward to fishing.  Today, he denies symptoms of palpitations, chest pain, or lower extremity edema.  The patient is otherwise without complaint today.   Past Medical History:  Diagnosis Date  . Arthritis   . Benign prostatic hypertrophy   . CAD (coronary artery disease)    DES, SVG to circumflex marginal, October, 2010 ( SVG to PDA and PLA patent , LIMA to LAD patent, EF 60%, mild inferior hypokinesis  . Complete heart block ([place])    a. s/p MDT CRTP 04/2016 Dr [name]  . GERD (gastroesophageal reflux disease)   . HTN (hypertension)   . Hyperlipidemia   . Hypothyroidism   . Nephrolithiasis   . Neuromuscular disorder ([place])   . Peripheral neuropathy ([place])   . Psoriasis    severe; with total skin exfoliation in the past resolved  . S/P TAVR (transcatheter aortic valve replacement)    severe AS >> s/p TAVR 9/17 // Limited Echo 10/17: EF 40-45%, lat and inf-lat HK, TAVR ok, MAC, mild BAE, PASP 31 mmHg, no effusion  . Ventricular tachycardia, non-sustained [facility])    Past Surgical History:  Procedure Laterality Date  . APPENDECTOMY    . bilateral L3-4 laminectomies    . CARDIAC CATHETERIZATION N/A 11/17/2015   Procedure: Right/Left Heart Cath and Coronary Angiography;  Surgeon: Peter M Martinique, MD;  Location: [place] CV LAB;  Service: Cardiovascular;  Laterality: N/A;  . CARDIAC CATHETERIZATION N/A 11/17/2015   Procedure: Coronary Stent Intervention;  Surgeon: Peter M Martinique, MD;  Location: [place] CV  LAB;  Service: Cardiovascular;  Laterality: N/A;  . CARDIAC CATHETERIZATION N/A 04/19/2016   Procedure: Right/Left Heart Cath and Coronary/Graft Angiography;  Surgeon: [name], MD;  Location: [place] CV LAB;  Service: Cardiovascular;  Laterality: N/A;  . CAROTID ENDARTERECTOMY Left 02-24-08   cea  Dr. Amedeo Plenty  . CARPAL TUNNEL RELEASE Bilateral 2010  . CATARACT EXTRACTION, BILATERAL    . COLONOSCOPY  11/23/2011   Procedure: COLONOSCOPY;  Surgeon: Rogene Houston, MD;  [facility];  Service: Endoscopy;  Laterality: N/A;  730  . CORONARY ANGIOPLASTY     2 stents  . CORONARY ARTERY BYPASS GRAFT  1995   x 3  . CORONARY STENT PLACEMENT  11/17/2015   SVG   DES to RCA  . decompression of left median nerve    . EP IMPLANTABLE DEVICE N/A 04/27/2016   MDT CRTP implanted by Dr [name]  . JOINT REPLACEMENT Left Sept. 27, 2013   Knee  . left carotid endarectomy and Dacron patch angioplasty]    . LEFT HEART CATHETERIZATION WITH CORONARY ANGIOGRAM N/A 12/27/2012   Procedure: LEFT HEART CATHETERIZATION WITH CORONARY ANGIOGRAM;  Surgeon: Burnell Blanks, MD;  Location: [facility] CATH LAB;  Service: Cardiovascular;  Laterality: N/A;  . posterior arthrodesis with autograft and allograft    . removal of synovial cyst    . TEE WITHOUT CARDIOVERSION N/A 04/21/2016   Procedure: TRANSESOPHAGEAL ECHOCARDIOGRAM (TEE);  Surgeon: Pixie Casino, MD;  Location: [facility] ENDOSCOPY;  Service: Cardiovascular;  Laterality: N/A;  . TEE WITHOUT CARDIOVERSION N/A 05/16/2016   Procedure: TRANSESOPHAGEAL ECHOCARDIOGRAM (TEE);  Surgeon: [name], MD;  Location: [place];  Service: Open Heart Surgery;  Laterality: N/A;  . TONSILLECTOMY    . TOTAL KNEE ARTHROPLASTY  04/16/2012   Procedure: TOTAL KNEE ARTHROPLASTY;  Surgeon: Garald Balding, MD;  Location: [place];  Service: Orthopedics;  Laterality: Left;  . TRANSCATHETER AORTIC VALVE REPLACEMENT, TRANSFEMORAL N/A 05/16/2016   Procedure: TRANSCATHETER AORTIC VALVE  REPLACEMENT, TRANSFEMORAL;  Surgeon: [name], MD;  Location: [place];  Service: Open Heart Surgery;  Laterality: N/A;    ROS- all systems are reviewed and negatives except as per HPI above  Current Outpatient Prescriptions  Medication Sig Dispense Refill  . allopurinol (ZYLOPRIM) 300 MG tablet Take 600 mg by mouth daily.     Marland Kitchen amoxicillin (AMOXIL) 500 MG tablet Take 4 tablets (2,000 mg total) by mouth as needed. Take 4 of the 500 mg tablets = 2000 mg 60 minutes before dental procedure 4 tablet 2  . aspirin EC 81 MG tablet Take 81 mg by mouth daily.    Marland Kitchen atorvastatin (LIPITOR) 20 MG tablet Take 20 mg by mouth every evening.     . clobetasol cream (TEMOVATE) [id number] % Apply 1 application topically 2 (two) times daily as needed (SKIN).     Marland Kitchen clopidogrel (PLAVIX) 75 MG tablet Take 1 tablet (75 mg total) by mouth daily with breakfast. 90 tablet 3  . Docusate Sodium (COLACE PO) Take 2 tablets by mouth daily.    . fluticasone (FLONASE) 50 MCG/ACT nasal spray Place 1 spray into both nostrils 2 (two) times daily.    . furosemide (LASIX) 40 MG tablet Take 1.5 tablets (60 mg total) by mouth daily. 90 tablet 3  . latanoprost (XALATAN) 0.005 % ophthalmic solution Place 1 drop into both eyes at bedtime.    Marland Kitchen levothyroxine (SYNTHROID, LEVOTHROID) 112 MCG tablet Take 112 mcg by mouth daily before breakfast.    . metoprolol succinate (TOPROL-XL) 50 MG 24 hr tablet Take one-half tablet by mouth twice a day. Take with or immediately following a meal. 90 tablet 3  . multivitamin (THERAGRAN) per tablet Take 1 tablet by mouth daily.     . nitroGLYCERIN (NITROSTAT) 0.4 MG SL tablet Place 0.4 mg under the tongue every 5 (five) minutes x 3 doses as needed. For chest pain    . potassium chloride SA (K-DUR,KLOR-CON) 20 MEQ tablet Take 1.5 tablets (30 mEq total) by mouth daily. 90 tablet 3  . pyridOXINE (VITAMIN B-6) 100 MG tablet Take 100 mg by mouth daily.     . Tamsulosin HCl (FLOMAX) 0.4 MG CAPS Take 0.4 mg by  mouth daily.     . valsartan (DIOVAN) 80 MG tablet Take 1 tablet (80 mg total) by mouth daily. 30 tablet 6   No current facility-administered medications for this visit.     Physical Exam: Vitals:   07/31/16 1402  BP: (!) 142/46  Pulse: 83  SpO2: 96%  Weight: 177 lb 6.4 oz (80.5 kg)  Height: 5\' 8"  (1.727 m)    GEN- The patient is well appearing, alert and oriented x 3 today.   Head- normocephalic, atraumatic Eyes-  Sclera clear, conjunctiva pink Ears- hearing intact Oropharynx- clear Lungs- Clear to ausculation bilaterally, normal work of breathing Heart- Regular rate and rhythm with ectopy GI- soft, NT, ND, + BS Extremities- no clubbing, cyanosis, or  edema Minor ecchymosis over his ILR implant site  ILR interrogation is reviewed today Echo and cath reviewed,  Dr Raylene Everts note is also reviewed  ekg today reveals sinus rhythm with BIV pacing and frequent PVCs  Assessment and Plan:  1. H/o syncope and transient second degree AV block Resolved with BiV pacing Normal pacemaker function See Pace Art report No changes today  2. Severe AS S/p TAVR Doing well  3. Frequent PVCs Does better with reduced beta blocker dosing  4. Chronic systolic dysfunction Stable  Continue carelink remote monitoring Return to see me in 6 months  [name] MD, [facility] 07/31/2016 8:51 PM

## 2016-08-02 DIAGNOSIS — H903 Sensorineural hearing loss, bilateral: Secondary | ICD-10-CM | POA: Diagnosis not present

## 2016-08-02 DIAGNOSIS — R42 Dizziness and giddiness: Secondary | ICD-10-CM | POA: Diagnosis not present

## 2016-08-02 DIAGNOSIS — H6123 Impacted cerumen, bilateral: Secondary | ICD-10-CM | POA: Diagnosis not present

## 2016-08-03 DIAGNOSIS — R69 Illness, unspecified: Secondary | ICD-10-CM | POA: Diagnosis not present

## 2016-08-07 DIAGNOSIS — M65322 Trigger finger, left index finger: Secondary | ICD-10-CM | POA: Diagnosis not present

## 2016-08-11 ENCOUNTER — Encounter: Payer: Self-pay | Admitting: Internal Medicine

## 2016-08-11 DIAGNOSIS — I2510 Atherosclerotic heart disease of native coronary artery without angina pectoris: Secondary | ICD-10-CM | POA: Diagnosis not present

## 2016-08-11 DIAGNOSIS — I10 Essential (primary) hypertension: Secondary | ICD-10-CM | POA: Diagnosis not present

## 2016-08-11 DIAGNOSIS — Z79899 Other long term (current) drug therapy: Secondary | ICD-10-CM | POA: Diagnosis not present

## 2016-08-11 DIAGNOSIS — E039 Hypothyroidism, unspecified: Secondary | ICD-10-CM | POA: Diagnosis not present

## 2016-08-16 DIAGNOSIS — Z961 Presence of intraocular lens: Secondary | ICD-10-CM | POA: Diagnosis not present

## 2016-08-16 DIAGNOSIS — H401131 Primary open-angle glaucoma, bilateral, mild stage: Secondary | ICD-10-CM | POA: Diagnosis not present

## 2016-08-18 DIAGNOSIS — I10 Essential (primary) hypertension: Secondary | ICD-10-CM | POA: Diagnosis not present

## 2016-08-18 DIAGNOSIS — Z954 Presence of other heart-valve replacement: Secondary | ICD-10-CM | POA: Diagnosis not present

## 2016-08-18 DIAGNOSIS — I5022 Chronic systolic (congestive) heart failure: Secondary | ICD-10-CM | POA: Diagnosis not present

## 2016-08-18 DIAGNOSIS — D509 Iron deficiency anemia, unspecified: Secondary | ICD-10-CM | POA: Diagnosis not present

## 2016-08-23 ENCOUNTER — Ambulatory Visit (INDEPENDENT_AMBULATORY_CARE_PROVIDER_SITE_OTHER): Payer: Medicare HMO | Admitting: Orthopaedic Surgery

## 2016-08-23 VITALS — BP 111/60

## 2016-08-23 DIAGNOSIS — M25512 Pain in left shoulder: Secondary | ICD-10-CM

## 2016-08-23 DIAGNOSIS — G8929 Other chronic pain: Secondary | ICD-10-CM | POA: Diagnosis not present

## 2016-08-23 MED ORDER — METHYLPREDNISOLONE ACETATE 40 MG/ML IJ SUSP
80.0000 mg | INTRAMUSCULAR | Status: AC | PRN
  Administered 2016-08-23: 80 mg

## 2016-08-23 MED ORDER — BUPIVACAINE HCL 0.5 % IJ SOLN
2.0000 mL | INTRAMUSCULAR | Status: AC | PRN
  Administered 2016-08-23: 2 mL via INTRA_ARTICULAR

## 2016-08-23 MED ORDER — LIDOCAINE HCL 1 % IJ SOLN
2.0000 mL | INTRAMUSCULAR | Status: AC | PRN
  Administered 2016-08-23: 2 mL

## 2016-08-23 NOTE — Progress Notes (Signed)
Office Visit Note   Patient: [name]           Date of Birth: 1931/04/22           MRN: [id number] Visit Date: 08/23/2016              Requested by: [name], MD [address] [place], [place] [address] PCP: [name], MD   Assessment & Plan: Visit Diagnoses: Primary osteoarthritis left shoulder  Plan: Cortisone injection left shoulder and follow up as needed  Follow-Up Instructions: No Follow-up on file.   Orders:  No orders of the defined types were placed in this encounter.  No orders of the defined types were placed in this encounter.     Procedures: Large Joint Inj Date/Time: 08/23/2016 9:54 AM Performed by: Garald Balding Authorized by: Garald Balding   Consent Given by:  Patient Timeout: prior to procedure the correct patient, procedure, and site was verified   Indications:  Pain Location:  Shoulder Site:  L glenohumeral Prep: patient was prepped and draped in usual sterile fashion   Needle Size:  25 G Needle Length:  1.5 inches Approach:  Posterior Ultrasound Guidance: No   Fluoroscopic Guidance: No   Arthrogram: No   Medications:  2 mL lidocaine 1 %; 2 mL bupivacaine 0.5 %; 80 mg methylPREDNISolone acetate 40 MG/ML Aspiration Attempted: No   Patient tolerance:  Patient tolerated the procedure well with no immediate complications     Clinical Data: No additional findings.   Subjective: No chief complaint on file.   Pt having recurring Left shoulder pain, pt received a pace maker at [facility].    Mr [name] has been seen on  numerous occasions for the osteoarthritis of his left shoulder. He is planning his yearly trip to [place] to fish and wishes to have another cortisone injection in his left shoulder. He is having difficulty with overhead motion and pain. Review of Systems   Objective: Vital Signs: There were no vitals taken for this visit.  Physical Exam  Ortho Exam shoulder exam demonstrates approximately 75 of  abduction and flexion with crepitation. Skin is intact. Recent pacemaker was inserted in the left chest wall and appears to be intact. Limited internal/external rotation left nondominant shoulder. Neurovascular exam left hand intact  Specialty Comments:  No specialty comments available.  Imaging: No results found.   PMFS History: Patient Active Problem List   Diagnosis Date Noted  . Severe Aortic Stenosis S/P TAVR (transcatheter aortic valve replacement) 05/24/2016  . Head trauma 04/25/2016  . Paresthesia 01/20/2016  . Dyspnea 11/17/2015  . CAD (coronary artery disease) of bypass graft 11/17/2015  . Abnormal cardiovascular stress test   . Spinal stenosis of lumbar region 07/22/2015  . Abnormality of gait 07/22/2015  . Transient complete heart block ([place]) Mar 25, 202015  . Hypertensive cardiovascular disease Mar 25, 202015  . Bradycardia 12/23/2013  . Aftercare following surgery of the circulatory system, [place] 06/30/2013  . PVC's (premature ventricular contractions)   . Neuromuscular disorder ([place])   . Nephrolithiasis   . Hypothyroidism   . Ventricular tachycardia, non-sustained ([place])   . Osteoarthritis of knee 04/19/2012  . Hyponatremia 04/19/2012  . Hypokalemia 04/19/2012  . Postoperative anemia due to acute blood loss 04/19/2012  . Aortic stenosis   . Occlusion and stenosis of carotid artery without mention of cerebral infarction 02/13/2012  . Carotid artery disease ([place])   . Hyperlipidemia   . HTN (hypertension)   . CAD (coronary artery disease)   .  Syncope   . Psoriasis   . Ejection fraction   . Fluid overload   . Carpal tunnel syndrome   . Hx of CABG   . BENIGN PROSTATIC HYPERTROPHY, HX OF 06/11/2009   Past Medical History:  Diagnosis Date  . Arthritis   . Benign prostatic hypertrophy   . CAD (coronary artery disease)    DES, SVG to circumflex marginal, October, 2010 ( SVG to PDA and PLA patent , LIMA to LAD patent, EF 60%, mild inferior hypokinesis  . Complete heart  block ([place])    a. s/p MDT CRTP 04/2016 Dr [name]  . GERD (gastroesophageal reflux disease)   . HTN (hypertension)   . Hyperlipidemia   . Hypothyroidism   . Nephrolithiasis   . Neuromuscular disorder ([place])   . Peripheral neuropathy ([place])   . Psoriasis    severe; with total skin exfoliation in the past resolved  . S/P TAVR (transcatheter aortic valve replacement)    severe AS >> s/p TAVR 9/17 // Limited Echo 10/17: EF 40-45%, lat and inf-lat HK, TAVR ok, MAC, mild BAE, PASP 31 mmHg, no effusion  . Ventricular tachycardia, non-sustained ([facility])     Family History  Problem Relation Age of Onset  . Heart attack Mother   . Heart disease Mother     Heart Disease before age 43  . Heart disease Father     Heart Disease before age 76  . Hypertension Father   . Hyperlipidemia Father   . Heart attack Son 56  . Colon cancer Neg Hx     Past Surgical History:  Procedure Laterality Date  . APPENDECTOMY    . bilateral L3-4 laminectomies    . CARDIAC CATHETERIZATION N/A 11/17/2015   Procedure: Right/Left Heart Cath and Coronary Angiography;  Surgeon: Mirielle Byrum M Martinique, MD;  Location: [place] [facility] LAB;  Service: Cardiovascular;  Laterality: N/A;  . CARDIAC CATHETERIZATION N/A 11/17/2015   Procedure: Coronary Stent Intervention;  Surgeon: Janashia Parco M Martinique, MD;  Location: [place] CV LAB;  Service: Cardiovascular;  Laterality: N/A;  . CARDIAC CATHETERIZATION N/A 04/19/2016   Procedure: Right/Left Heart Cath and Coronary/Graft Angiography;  Surgeon: [name], MD;  Location: [place] [facility] LAB;  Service: Cardiovascular;  Laterality: N/A;  . CAROTID ENDARTERECTOMY Left 02-24-08   cea  Dr. Amedeo Plenty  . CARPAL TUNNEL RELEASE Bilateral 2010  . CATARACT EXTRACTION, BILATERAL    . COLONOSCOPY  11/23/2011   Procedure: COLONOSCOPY;  Surgeon: Rogene Houston, MD;  [facility];  Service: Endoscopy;  Laterality: N/A;  730  . CORONARY ANGIOPLASTY     2 stents  . CORONARY ARTERY BYPASS GRAFT  1995   x  3  . CORONARY STENT PLACEMENT  11/17/2015   SVG   DES to RCA  . decompression of left median nerve    . EP IMPLANTABLE DEVICE N/A 04/27/2016   MDT CRTP implanted by Dr [name]  . JOINT REPLACEMENT Left Sept. 27, 2013   Knee  . left carotid endarectomy and Dacron patch angioplasty]    . LEFT HEART CATHETERIZATION WITH CORONARY ANGIOGRAM N/A 12/27/2012   Procedure: LEFT HEART CATHETERIZATION WITH CORONARY ANGIOGRAM;  Surgeon: Burnell Blanks, MD;  Location: [facility] CATH LAB;  Service: Cardiovascular;  Laterality: N/A;  . posterior arthrodesis with autograft and allograft    . removal of synovial cyst    . TEE WITHOUT CARDIOVERSION N/A 04/21/2016   Procedure: TRANSESOPHAGEAL ECHOCARDIOGRAM (TEE);  Surgeon: Pixie Casino, MD;  Location: [facility] ENDOSCOPY;  Service: Cardiovascular;  Laterality: N/A;  . TEE WITHOUT CARDIOVERSION N/A 05/16/2016   Procedure: TRANSESOPHAGEAL ECHOCARDIOGRAM (TEE);  Surgeon: [name], MD;  Location: [facility];  Service: Open Heart Surgery;  Laterality: N/A;  . TONSILLECTOMY    . TOTAL KNEE ARTHROPLASTY  04/16/2012   Procedure: TOTAL KNEE ARTHROPLASTY;  Surgeon: Garald Balding, MD;  Location: [place];  Service: Orthopedics;  Laterality: Left;  . TRANSCATHETER AORTIC VALVE REPLACEMENT, TRANSFEMORAL N/A 05/16/2016   Procedure: TRANSCATHETER AORTIC VALVE REPLACEMENT, TRANSFEMORAL;  Surgeon: [name], MD;  Location: [place];  Service: Open Heart Surgery;  Laterality: N/A;   Social History   Occupational History  . Retired    Social History Main Topics  . Smoking status: Never Smoker  . Smokeless tobacco: Never Used  . Alcohol use No     Comment: One beer per week.  . Drug use: No  . Sexual activity: Not Currently

## 2016-10-30 ENCOUNTER — Telehealth: Payer: Self-pay | Admitting: Cardiology

## 2016-10-30 ENCOUNTER — Ambulatory Visit (INDEPENDENT_AMBULATORY_CARE_PROVIDER_SITE_OTHER): Payer: Medicare HMO | Admitting: *Deleted

## 2016-10-30 DIAGNOSIS — I441 Atrioventricular block, second degree: Secondary | ICD-10-CM | POA: Diagnosis not present

## 2016-10-30 NOTE — Telephone Encounter (Signed)
Spoke with pt and reminded pt of remote transmission that is due today. Pt verbalized understanding.   

## 2016-10-31 LAB — CUP PACEART REMOTE DEVICE CHECK
Battery Voltage: 3.04 V
Brady Statistic AP VP Percent: 5.89 %
Brady Statistic AP VS Percent: 0.02 %
Brady Statistic AS VS Percent: 6.94 %
Brady Statistic RA Percent Paced: 7.21 %
Brady Statistic RV Percent Paced: 93.01 %
Implantable Lead Implant Date: 20170907
Implantable Lead Implant Date: 20170907
Implantable Lead Location: 753858
Implantable Lead Location: 753860
Implantable Lead Model: 4598
Implantable Lead Model: 5076
Implantable Lead Model: 5076
Lead Channel Impedance Value: 285 Ohm
Lead Channel Impedance Value: 304 Ohm
Lead Channel Impedance Value: 418 Ohm
Lead Channel Impedance Value: 418 Ohm
Lead Channel Impedance Value: 437 Ohm
Lead Channel Impedance Value: 589 Ohm
Lead Channel Impedance Value: 665 Ohm
Lead Channel Impedance Value: 722 Ohm
Lead Channel Pacing Threshold Amplitude: 3 V
Lead Channel Pacing Threshold Pulse Width: 0.4 ms
Lead Channel Sensing Intrinsic Amplitude: 3.125 mV
Lead Channel Sensing Intrinsic Amplitude: 9 mV
Lead Channel Sensing Intrinsic Amplitude: 9 mV
Lead Channel Setting Pacing Amplitude: 4 V
Lead Channel Setting Pacing Pulse Width: 0.4 ms
MDC IDC LEAD IMPLANT DT: 20170907
MDC IDC LEAD LOCATION: 753859
MDC IDC MSMT BATTERY REMAINING LONGEVITY: 75 mo
MDC IDC MSMT LEADCHNL LV IMPEDANCE VALUE: 304 Ohm
MDC IDC MSMT LEADCHNL LV IMPEDANCE VALUE: 361 Ohm
MDC IDC MSMT LEADCHNL LV IMPEDANCE VALUE: 361 Ohm
MDC IDC MSMT LEADCHNL LV IMPEDANCE VALUE: 589 Ohm
MDC IDC MSMT LEADCHNL LV IMPEDANCE VALUE: 703 Ohm
MDC IDC MSMT LEADCHNL LV PACING THRESHOLD PULSEWIDTH: 0.8 ms
MDC IDC MSMT LEADCHNL RA PACING THRESHOLD AMPLITUDE: 0.875 V
MDC IDC MSMT LEADCHNL RA PACING THRESHOLD PULSEWIDTH: 0.4 ms
MDC IDC MSMT LEADCHNL RA SENSING INTR AMPL: 3.125 mV
MDC IDC MSMT LEADCHNL RV IMPEDANCE VALUE: 399 Ohm
MDC IDC MSMT LEADCHNL RV PACING THRESHOLD AMPLITUDE: 1.25 V
MDC IDC PG IMPLANT DT: 20170907
MDC IDC SESS DTM: 20180312173856
MDC IDC SET LEADCHNL LV PACING PULSEWIDTH: 0.8 ms
MDC IDC SET LEADCHNL RA PACING AMPLITUDE: 2 V
MDC IDC SET LEADCHNL RV PACING AMPLITUDE: 2.5 V
MDC IDC SET LEADCHNL RV SENSING SENSITIVITY: 0.9 mV
MDC IDC STAT BRADY AS VP PERCENT: 87.15 %

## 2016-10-31 NOTE — Progress Notes (Signed)
Remote pacemaker transmission.   

## 2016-11-01 ENCOUNTER — Encounter: Payer: Self-pay | Admitting: Cardiology

## 2016-12-11 DIAGNOSIS — I10 Essential (primary) hypertension: Secondary | ICD-10-CM | POA: Diagnosis not present

## 2016-12-11 DIAGNOSIS — E039 Hypothyroidism, unspecified: Secondary | ICD-10-CM | POA: Diagnosis not present

## 2016-12-11 DIAGNOSIS — I2510 Atherosclerotic heart disease of native coronary artery without angina pectoris: Secondary | ICD-10-CM | POA: Diagnosis not present

## 2016-12-11 DIAGNOSIS — M65322 Trigger finger, left index finger: Secondary | ICD-10-CM | POA: Diagnosis not present

## 2016-12-11 DIAGNOSIS — Z79899 Other long term (current) drug therapy: Secondary | ICD-10-CM | POA: Diagnosis not present

## 2016-12-11 DIAGNOSIS — M109 Gout, unspecified: Secondary | ICD-10-CM | POA: Diagnosis not present

## 2016-12-11 DIAGNOSIS — M65332 Trigger finger, left middle finger: Secondary | ICD-10-CM | POA: Diagnosis not present

## 2016-12-19 ENCOUNTER — Ambulatory Visit (HOSPITAL_COMMUNITY)
Admission: RE | Admit: 2016-12-19 | Discharge: 2016-12-19 | Disposition: A | Discharge status: Home / Self Care | Payer: Medicare HMO | Source: Ambulatory Visit | Attending: Internal Medicine | Admitting: Internal Medicine

## 2016-12-19 ENCOUNTER — Other Ambulatory Visit (HOSPITAL_COMMUNITY): Payer: Self-pay | Admitting: Internal Medicine

## 2016-12-19 DIAGNOSIS — Z6828 Body mass index (BMI) 28.0-28.9, adult: Secondary | ICD-10-CM | POA: Diagnosis not present

## 2016-12-19 DIAGNOSIS — R042 Hemoptysis: Secondary | ICD-10-CM | POA: Diagnosis not present

## 2016-12-19 DIAGNOSIS — E785 Hyperlipidemia, unspecified: Secondary | ICD-10-CM | POA: Diagnosis not present

## 2016-12-19 DIAGNOSIS — I5022 Chronic systolic (congestive) heart failure: Secondary | ICD-10-CM | POA: Diagnosis not present

## 2017-01-16 DIAGNOSIS — Z951 Presence of aortocoronary bypass graft: Secondary | ICD-10-CM | POA: Diagnosis not present

## 2017-01-16 DIAGNOSIS — I2510 Atherosclerotic heart disease of native coronary artery without angina pectoris: Secondary | ICD-10-CM | POA: Diagnosis not present

## 2017-01-16 DIAGNOSIS — L814 Other melanin hyperpigmentation: Secondary | ICD-10-CM | POA: Diagnosis not present

## 2017-01-16 DIAGNOSIS — L821 Other seborrheic keratosis: Secondary | ICD-10-CM | POA: Diagnosis not present

## 2017-01-16 DIAGNOSIS — I781 Nevus, non-neoplastic: Secondary | ICD-10-CM | POA: Diagnosis not present

## 2017-01-16 DIAGNOSIS — C44219 Basal cell carcinoma of skin of left ear and external auricular canal: Secondary | ICD-10-CM | POA: Diagnosis not present

## 2017-01-16 DIAGNOSIS — Z888 Allergy status to other drugs, medicaments and biological substances status: Secondary | ICD-10-CM | POA: Diagnosis not present

## 2017-01-16 DIAGNOSIS — D489 Neoplasm of uncertain behavior, unspecified: Secondary | ICD-10-CM | POA: Diagnosis not present

## 2017-01-16 DIAGNOSIS — Z882 Allergy status to sulfonamides status: Secondary | ICD-10-CM | POA: Diagnosis not present

## 2017-01-16 DIAGNOSIS — D1801 Hemangioma of skin and subcutaneous tissue: Secondary | ICD-10-CM | POA: Diagnosis not present

## 2017-01-16 DIAGNOSIS — L853 Xerosis cutis: Secondary | ICD-10-CM | POA: Diagnosis not present

## 2017-01-24 ENCOUNTER — Ambulatory Visit (INDEPENDENT_AMBULATORY_CARE_PROVIDER_SITE_OTHER): Payer: Medicare HMO | Admitting: Internal Medicine

## 2017-01-24 ENCOUNTER — Encounter: Payer: Self-pay | Admitting: Internal Medicine

## 2017-01-24 VITALS — BP 144/72 | HR 67 | Ht 68.0 in | Wt 184.0 lb

## 2017-01-24 DIAGNOSIS — Z950 Presence of cardiac pacemaker: Secondary | ICD-10-CM

## 2017-01-24 DIAGNOSIS — Z952 Presence of prosthetic heart valve: Secondary | ICD-10-CM | POA: Diagnosis not present

## 2017-01-24 DIAGNOSIS — I441 Atrioventricular block, second degree: Secondary | ICD-10-CM

## 2017-01-24 DIAGNOSIS — R001 Bradycardia, unspecified: Secondary | ICD-10-CM | POA: Diagnosis not present

## 2017-01-24 NOTE — Patient Instructions (Addendum)
Medication Instructions:  Your physician has recommended you make the following change in your medication:  1) Stop Aspirin .   Labwork: Your physician recommends that you return for lab work today: CBC/BMP/MAG    Testing/Procedures: None ordered   Follow-Up:  Your physician recommends that you schedule a follow-up appointment in: 6 weeks with Chanetta Marshall, NP  Remote monitoring is used to monitor your ICD from home. This monitoring reduces the number of office visits required to check your device to one time per year. It allows [place] to keep an eye on the functioning of your device to ensure it is working properly. You are scheduled for a device check from home on 04/25/17. You may send your transmission at any time that day. If you have a wireless device, the transmission will be sent automatically. After your physician reviews your transmission, you will receive a postcard with your next transmission date.      Any Other Special Instructions Will Be Listed Below (If Applicable).     If you need a refill on your cardiac medications before your next appointment, please call your pharmacy.

## 2017-01-24 NOTE — Progress Notes (Signed)
PCP: Daniel Noble, MD Primary Cardiologist:  Dr [name] Primary Interventionalist s/p TAVR: [name] Primary EP: [name]  [name] is a 83 y.o. male who presents today for routine electrophysiology followup.  Since last being seen in our clinic, the patient reports doing very well.  He remains active and continues to fish and play golf regularly.  He has some fatigue.  He is SOB with moderate activity.  He had a "weak spell" yesterday and collapsed faling to the ground.  Though he skinned his forehead with the fall, he denies HA, visual change, N/V, confusion, or other neuro symptoms.  He did not have syncope.  He also notices "thumping" occasionally on his L side with his PPM.   Today, he denies symptoms of palpitations, chest pain, shortness of breath,  lower extremity edema, dizziness, presyncope, or syncope.  The patient is otherwise without complaint today.   Past Medical History:  Diagnosis Date  . Arthritis   . Benign prostatic hypertrophy   . CAD (coronary artery disease)    DES, SVG to circumflex marginal, October, 2010 ( SVG to PDA and PLA patent , LIMA to LAD patent, EF 60%, mild inferior hypokinesis  . Complete heart block ([place])    a. s/p MDT CRTP 04/2016 Dr [name]  . GERD (gastroesophageal reflux disease)   . HTN (hypertension)   . Hyperlipidemia   . Hypothyroidism   . Nephrolithiasis   . Neuromuscular disorder ([place])   . Peripheral neuropathy   . Psoriasis    severe; with total skin exfoliation in the past resolved  . S/P TAVR (transcatheter aortic valve replacement)    severe AS >> s/p TAVR 9/17 // Limited Echo 10/17: EF 40-45%, lat and inf-lat HK, TAVR ok, MAC, mild BAE, PASP 31 mmHg, no effusion  . Ventricular tachycardia, non-sustained [facility])    Past Surgical History:  Procedure Laterality Date  . APPENDECTOMY    . bilateral L3-4 laminectomies    . CARDIAC CATHETERIZATION N/A 11/17/2015   Procedure: Right/Left Heart Cath and Coronary Angiography;  Surgeon: Peter M  Martinique, MD;  Location: [place] CV LAB;  Service: Cardiovascular;  Laterality: N/A;  . CARDIAC CATHETERIZATION N/A 11/17/2015   Procedure: Coronary Stent Intervention;  Surgeon: Peter M Martinique, MD;  Location: [place] CV LAB;  Service: Cardiovascular;  Laterality: N/A;  . CARDIAC CATHETERIZATION N/A 04/19/2016   Procedure: Right/Left Heart Cath and Coronary/Graft Angiography;  Surgeon: [name], MD;  Location: [place] CV LAB;  Service: Cardiovascular;  Laterality: N/A;  . CAROTID ENDARTERECTOMY Left 02-24-08   cea  Dr. Amedeo Plenty  . CARPAL TUNNEL RELEASE Bilateral 2010  . CATARACT EXTRACTION, BILATERAL    . COLONOSCOPY  11/23/2011   Procedure: COLONOSCOPY;  Surgeon: Rogene Houston, MD;  [facility];  Service: Endoscopy;  Laterality: N/A;  730  . CORONARY ANGIOPLASTY     2 stents  . CORONARY ARTERY BYPASS GRAFT  1995   x 3  . CORONARY STENT PLACEMENT  11/17/2015   SVG   DES to RCA  . decompression of left median nerve    . EP IMPLANTABLE DEVICE N/A 04/27/2016   MDT CRTP implanted by Dr [name]  . JOINT REPLACEMENT Left Sept. 27, 2013   Knee  . left carotid endarectomy and Dacron patch angioplasty]    . LEFT HEART CATHETERIZATION WITH CORONARY ANGIOGRAM N/A 12/27/2012   Procedure: LEFT HEART CATHETERIZATION WITH CORONARY ANGIOGRAM;  Surgeon: Burnell Blanks, MD;  Location: [facility] CATH LAB;  Service: Cardiovascular;  Laterality: N/A;  . posterior arthrodesis with autograft and allograft    . removal of synovial cyst    . TEE WITHOUT CARDIOVERSION N/A 04/21/2016   Procedure: TRANSESOPHAGEAL ECHOCARDIOGRAM (TEE);  Surgeon: Pixie Casino, MD;  Location: [facility] ENDOSCOPY;  Service: Cardiovascular;  Laterality: N/A;  . TEE WITHOUT CARDIOVERSION N/A 05/16/2016   Procedure: TRANSESOPHAGEAL ECHOCARDIOGRAM (TEE);  Surgeon: [name], MD;  Location: [place];  Service: Open Heart Surgery;  Laterality: N/A;  . TONSILLECTOMY    . TOTAL KNEE ARTHROPLASTY  04/16/2012   Procedure: TOTAL  KNEE ARTHROPLASTY;  Surgeon: Garald Balding, MD;  Location: [place];  Service: Orthopedics;  Laterality: Left;  . TRANSCATHETER AORTIC VALVE REPLACEMENT, TRANSFEMORAL N/A 05/16/2016   Procedure: TRANSCATHETER AORTIC VALVE REPLACEMENT, TRANSFEMORAL;  Surgeon: [name], MD;  Location: [place];  Service: Open Heart Surgery;  Laterality: N/A;    ROS- all systems are reviewed and negative except as per HPI above  Current Outpatient Prescriptions  Medication Sig Dispense Refill  . allopurinol (ZYLOPRIM) 300 MG tablet Take 600 mg by mouth daily.     Marland Kitchen amoxicillin (AMOXIL) 500 MG tablet Take 4 tablets (2,000 mg total) by mouth as needed. Take 4 of the 500 mg tablets = 2000 mg 60 minutes before dental procedure 4 tablet 2  . aspirin EC 81 MG tablet Take 81 mg by mouth daily.    Marland Kitchen atorvastatin (LIPITOR) 20 MG tablet Take 20 mg by mouth every evening.     . clobetasol cream (TEMOVATE) 7.37 % Apply 1 application topically 2 (two) times daily as needed (SKIN).     Marland Kitchen clopidogrel (PLAVIX) 75 MG tablet Take 1 tablet (75 mg total) by mouth daily with breakfast. 90 tablet 3  . Docusate Sodium (COLACE PO) Take 2 tablets by mouth daily.    . fluticasone (FLONASE) 50 MCG/ACT nasal spray Place 1 spray into both nostrils 2 (two) times daily.    . furosemide (LASIX) 40 MG tablet Take 1.5 tablets (60 mg total) by mouth daily. 90 tablet 3  . latanoprost (XALATAN) 0.005 % ophthalmic solution Place 1 drop into both eyes at bedtime.    Marland Kitchen levothyroxine (SYNTHROID, LEVOTHROID) 112 MCG tablet Take 112 mcg by mouth daily before breakfast.    . Magnesium 250 MG TABS Take 1 tablet by mouth 2 (two) times daily.    . Methylcellulose, Laxative, (CITRUCEL PO) Take by mouth as directed. Take 1 tablespoon at night    . metoprolol succinate (TOPROL-XL) 50 MG 24 hr tablet Take one-half tablet by mouth twice a day. Take with or immediately following a meal. 90 tablet 3  . multivitamin (THERAGRAN) per tablet Take 1 tablet by mouth  daily.     . nitroGLYCERIN (NITROSTAT) 0.4 MG SL tablet Place 0.4 mg under the tongue every 5 (five) minutes x 3 doses as needed. For chest pain    . potassium chloride SA (K-DUR,KLOR-CON) 20 MEQ tablet Take 1.5 tablets (30 mEq total) by mouth daily. 90 tablet 3  . pyridOXINE (VITAMIN B-6) 100 MG tablet Take 100 mg by mouth daily.     . Tamsulosin HCl (FLOMAX) 0.4 MG CAPS Take 0.4 mg by mouth daily.     . valsartan (DIOVAN) 80 MG tablet Take 1 tablet (80 mg total) by mouth daily. 30 tablet 6   No current facility-administered medications for this visit.     Physical Exam: Vitals:   01/24/17 1047  BP: (!) 144/72  Pulse: 67  SpO2: 98%  Weight: 184  lb (83.5 kg)  Height: _0  (1.727 m)    GEN- The patient is well appearing, alert and oriented x 3 today.   Head- normocephalic, atraumatic Eyes-  Sclera clear, conjunctiva pink Ears- hearing intact Oropharynx- clear Lungs- Clear to ausculation bilaterally, normal work of breathing Chest- pacemaker pocket is well healed Heart- Regular rate and rhythm with frequent ectopy, 2/6 SEM LUSB which is early peaking GI- soft, NT, ND, + BS Extremities- no clubbing, cyanosis, +1 edema MS- diffuse muscle atrophy Skin- multiple and diffuse ecchymosis of varying ages,  + new abrasion on forehead s/p fall Neuro- strength and sensation are intact  Pacemaker interrogation- reviewed in detail today,  See PACEART report  Assessment and Plan:  1. Transient second degree AV block Normal pacemaker function See Pace Art report I have reprogrammed LV lead today from LV1-LV2 pair to LV4-LV3 pain for battery longevity (additional 2 years) as well as for diaphragmatic stimulation.  EKG lead I vector is now positive (was previously negative).  He will return in 6 weeks for further assessment/ optimization  2. Frequent PVCs Better with previously increased metoprolol Effective BIV pacing is 95% (was 89% last visit)  3. Severe AS/ CAD Doing well s/p  TAVR I have spoken with Dr [name] today.  Given frequent bruising and recent fall, will stop ASA and continue plavix  4. Chronic systolic dysfunction Daily weights Will need to avoid excessive heat Lasix 58m daily but 656mprn if weight is increased or with increased edema bmet today  5. Weakness Bmet, CBC today Adequate hydration and heat avoidance encouraged   Continue carelink remote monitoring Return to see EP NP in 6 weeks  I will see again in 6 months unless sooner is needed.  Dr CoBurt Knacklready scheduled to see in 3 months  JaThompson GrayerD, FAScripps Mercy [facility]/01/2017 11:46 AM

## 2017-01-25 LAB — CBC WITH DIFFERENTIAL/PLATELET
BASOS: 0 %
Basophils Absolute: 0 10*3/uL (ref 0.0–0.2)
EOS (ABSOLUTE): 0.1 10*3/uL (ref 0.0–0.4)
Eos: 1 %
HEMOGLOBIN: 10.3 g/dL — AB (ref 13.0–17.7)
Hematocrit: 30.4 % — ABNORMAL LOW (ref 37.5–51.0)
IMMATURE GRANS (ABS): 0 10*3/uL (ref 0.0–0.1)
Immature Granulocytes: 0 %
LYMPHS: 12 %
Lymphocytes Absolute: 1 10*3/uL (ref 0.7–3.1)
MCH: 32.5 pg (ref 26.6–33.0)
MCHC: 33.9 g/dL (ref 31.5–35.7)
MCV: 96 fL (ref 79–97)
MONOCYTES: 9 %
Monocytes Absolute: 0.8 10*3/uL (ref 0.1–0.9)
NEUTROS ABS: 6.8 10*3/uL (ref 1.4–7.0)
Neutrophils: 78 %
Platelets: 143 10*3/uL — ABNORMAL LOW (ref 150–379)
RBC: 3.17 x10E6/uL — ABNORMAL LOW (ref 4.14–5.80)
RDW: 13.9 % (ref 12.3–15.4)
WBC: 8.7 10*3/uL (ref 3.4–10.8)

## 2017-01-25 LAB — BASIC METABOLIC PANEL
BUN/Creatinine Ratio: 24 (ref 10–24)
BUN: 27 mg/dL (ref 8–27)
CALCIUM: 9.8 mg/dL (ref 8.6–10.2)
CO2: 24 mmol/L (ref 18–29)
Chloride: 99 mmol/L (ref 96–106)
Creatinine, Ser: 1.13 mg/dL (ref 0.76–1.27)
GFR, EST AFRICAN AMERICAN: 69 mL/min/{1.73_m2} (ref >59)
GFR, EST NON AFRICAN AMERICAN: 60 mL/min/{1.73_m2} (ref >59)
Glucose: 105 mg/dL — ABNORMAL HIGH (ref 65–99)
POTASSIUM: 4.8 mmol/L (ref 3.5–5.2)
Sodium: 140 mmol/L (ref 134–144)

## 2017-01-25 LAB — MAGNESIUM: MAGNESIUM: 2.3 mg/dL (ref 1.6–2.3)

## 2017-01-26 LAB — CUP PACEART INCLINIC DEVICE CHECK
Battery Remaining Longevity: 107 mo
Battery Voltage: 3 V
Brady Statistic AP VP Percent: 5.46 %
Brady Statistic AS VS Percent: 4.42 %
Brady Statistic RA Percent Paced: 6.38 %
Brady Statistic RV Percent Paced: 95.5 %
Implantable Lead Location: 753859
Implantable Lead Location: 753860
Implantable Lead Model: 4598
Implantable Lead Model: 5076
Implantable Lead Model: 5076
Implantable Pulse Generator Implant Date: 20170907
Lead Channel Impedance Value: 285 Ohm
Lead Channel Impedance Value: 304 Ohm
Lead Channel Impedance Value: 494 Ohm
Lead Channel Impedance Value: 703 Ohm
Lead Channel Impedance Value: 760 Ohm
Lead Channel Impedance Value: 779 Ohm
Lead Channel Pacing Threshold Amplitude: 1.25 V
Lead Channel Pacing Threshold Amplitude: 1.25 V
Lead Channel Pacing Threshold Amplitude: 3 V
Lead Channel Pacing Threshold Pulse Width: 0.4 ms
Lead Channel Sensing Intrinsic Amplitude: 3.375 mV
Lead Channel Setting Pacing Amplitude: 2 V
Lead Channel Setting Pacing Amplitude: 2.5 V
Lead Channel Setting Sensing Sensitivity: 0.9 mV
MDC IDC LEAD IMPLANT DT: 20170907
MDC IDC LEAD IMPLANT DT: 20170907
MDC IDC LEAD IMPLANT DT: 20170907
MDC IDC LEAD LOCATION: 753858
MDC IDC MSMT LEADCHNL LV IMPEDANCE VALUE: 608 Ohm
MDC IDC MSMT LEADCHNL LV IMPEDANCE VALUE: 646 Ohm
MDC IDC MSMT LEADCHNL LV PACING THRESHOLD PULSEWIDTH: 0.8 ms
MDC IDC MSMT LEADCHNL RA IMPEDANCE VALUE: 399 Ohm
MDC IDC MSMT LEADCHNL RA PACING THRESHOLD PULSEWIDTH: 0.4 ms
MDC IDC MSMT LEADCHNL RV IMPEDANCE VALUE: 399 Ohm
MDC IDC MSMT LEADCHNL RV SENSING INTR AMPL: 11.125 mV
MDC IDC SESS DTM: 20180606150650
MDC IDC SET LEADCHNL LV PACING PULSEWIDTH: 0.8 ms
MDC IDC SET LEADCHNL RA PACING AMPLITUDE: 2 V
MDC IDC SET LEADCHNL RV PACING PULSEWIDTH: 0.4 ms
MDC IDC STAT BRADY AP VS PERCENT: 0.02 %
MDC IDC STAT BRADY AS VP PERCENT: 90.09 %

## 2017-02-07 DIAGNOSIS — R69 Illness, unspecified: Secondary | ICD-10-CM | POA: Diagnosis not present

## 2017-02-13 DIAGNOSIS — H60541 Acute eczematoid otitis externa, right ear: Secondary | ICD-10-CM | POA: Diagnosis not present

## 2017-02-13 DIAGNOSIS — H6121 Impacted cerumen, right ear: Secondary | ICD-10-CM | POA: Diagnosis not present

## 2017-02-14 DIAGNOSIS — H401131 Primary open-angle glaucoma, bilateral, mild stage: Secondary | ICD-10-CM | POA: Diagnosis not present

## 2017-02-14 DIAGNOSIS — Z961 Presence of intraocular lens: Secondary | ICD-10-CM | POA: Diagnosis not present

## 2017-02-25 DIAGNOSIS — R69 Illness, unspecified: Secondary | ICD-10-CM | POA: Diagnosis not present

## 2017-02-27 NOTE — Progress Notes (Signed)
Electrophysiology Office Note Date: 03/01/2017  ID:  [name], DOB 1930/11/20, MRN [id number]  PCP: [name], MD Primary Cardiologist: Bronson Ing Electrophysiologist: [name] Structural: Burt Knack   CC: CRT follow up  [name] is a 83 y.o. male seen today for Dr [name].  He presents today for CRT followup.  Since last being seen in our clinic, the patient reports doing relatively well.  He remains active.  He has  had recurrent "weak spells" but none that caused pre-syncope or dizziness. He has not been able to correlate any activity to his spells. No arrhythmias on device interrogation. At last visit with Dr [name], LV vector was reprogrammed from LV1-LV2 to [place] for diaphragmatic stim and battery longevity. He has not had recurrent diaphragmatic stim.  He has noticed no functional change in exercise tolerance. He denies chest pain, palpitations, PND, orthopnea, nausea, vomiting, dizziness, syncope, weight gain, or early satiety.  Device History: MDT CRTP implanted 2017 for intermittent complete heart block, LV dysfunction   Past Medical History:  Diagnosis Date  . Arthritis   . Benign prostatic hypertrophy   . CAD (coronary artery disease)    DES, SVG to circumflex marginal, October, 2010 ( SVG to PDA and PLA patent , LIMA to LAD patent, EF 60%, mild inferior hypokinesis  . Complete heart block ([place])    a. s/p MDT CRTP 04/2016 Dr [name]  . GERD (gastroesophageal reflux disease)   . HTN (hypertension)   . Hyperlipidemia   . Hypothyroidism   . Nephrolithiasis   . Neuromuscular disorder ([place])   . Peripheral neuropathy   . Psoriasis    severe; with total skin exfoliation in the past resolved  . S/P TAVR (transcatheter aortic valve replacement)    severe AS >> s/p TAVR 9/17 // Limited Echo 10/17: EF 40-45%, lat and inf-lat HK, TAVR ok, MAC, mild BAE, PASP 31 mmHg, no effusion  . Ventricular tachycardia, non-sustained [facility])    Past Surgical History:  Procedure  Laterality Date  . APPENDECTOMY    . bilateral L3-4 laminectomies    . CARDIAC CATHETERIZATION N/A 11/17/2015   Procedure: Right/Left Heart Cath and Coronary Angiography;  Surgeon: Peter M Martinique, MD;  Location: [place] CV LAB;  Service: Cardiovascular;  Laterality: N/A;  . CARDIAC CATHETERIZATION N/A 11/17/2015   Procedure: Coronary Stent Intervention;  Surgeon: Peter M Martinique, MD;  Location: [place] CV LAB;  Service: Cardiovascular;  Laterality: N/A;  . CARDIAC CATHETERIZATION N/A 04/19/2016   Procedure: Right/Left Heart Cath and Coronary/Graft Angiography;  Surgeon: [name], MD;  Location: [place] CV LAB;  Service: Cardiovascular;  Laterality: N/A;  . CAROTID ENDARTERECTOMY Left 02-24-08   cea  Dr. Amedeo Plenty  . CARPAL TUNNEL RELEASE Bilateral 2010  . CATARACT EXTRACTION, BILATERAL    . COLONOSCOPY  11/23/2011   Procedure: COLONOSCOPY;  Surgeon: Rogene Houston, MD;  [facility];  Service: Endoscopy;  Laterality: N/A;  730  . CORONARY ANGIOPLASTY     2 stents  . CORONARY ARTERY BYPASS GRAFT  1995   x 3  . CORONARY STENT PLACEMENT  11/17/2015   SVG   DES to RCA  . decompression of left median nerve    . EP IMPLANTABLE DEVICE N/A 04/27/2016   MDT CRTP implanted by Dr [name]  . JOINT REPLACEMENT Left Sept. 27, 2013   Knee  . left carotid endarectomy and Dacron patch angioplasty]    . LEFT HEART CATHETERIZATION WITH CORONARY ANGIOGRAM N/A 12/27/2012  Procedure: LEFT HEART CATHETERIZATION WITH CORONARY ANGIOGRAM;  Surgeon: Burnell Blanks, MD;  Location: [facility] CATH LAB;  Service: Cardiovascular;  Laterality: N/A;  . posterior arthrodesis with autograft and allograft    . removal of synovial cyst    . TEE WITHOUT CARDIOVERSION N/A 04/21/2016   Procedure: TRANSESOPHAGEAL ECHOCARDIOGRAM (TEE);  Surgeon: Pixie Casino, MD;  Location: [facility] ENDOSCOPY;  Service: Cardiovascular;  Laterality: N/A;  . TEE WITHOUT CARDIOVERSION N/A 05/16/2016   Procedure: TRANSESOPHAGEAL  ECHOCARDIOGRAM (TEE);  Surgeon: [name], MD;  Location: [place];  Service: Open Heart Surgery;  Laterality: N/A;  . TONSILLECTOMY    . TOTAL KNEE ARTHROPLASTY  04/16/2012   Procedure: TOTAL KNEE ARTHROPLASTY;  Surgeon: Garald Balding, MD;  Location: [place];  Service: Orthopedics;  Laterality: Left;  . TRANSCATHETER AORTIC VALVE REPLACEMENT, TRANSFEMORAL N/A 05/16/2016   Procedure: TRANSCATHETER AORTIC VALVE REPLACEMENT, TRANSFEMORAL;  Surgeon: [name], MD;  Location: [place];  Service: Open Heart Surgery;  Laterality: N/A;    Current Outpatient Prescriptions  Medication Sig Dispense Refill  . allopurinol (ZYLOPRIM) 300 MG tablet Take 600 mg by mouth daily.     Marland Kitchen amoxicillin (AMOXIL) 500 MG tablet Take 4 tablets (2,000 mg total) by mouth as needed. Take 4 of the 500 mg tablets = 2000 mg 60 minutes before dental procedure 4 tablet 2  . atorvastatin (LIPITOR) 20 MG tablet Take 20 mg by mouth every evening.     . clobetasol cream (TEMOVATE) 0.62 % Apply 1 application topically 2 (two) times daily as needed (SKIN).     Marland Kitchen clopidogrel (PLAVIX) 75 MG tablet Take 1 tablet (75 mg total) by mouth daily with breakfast. 90 tablet 3  . Docusate Sodium (COLACE PO) Take 2 tablets by mouth daily.    . fluticasone (FLONASE) 50 MCG/ACT nasal spray Place 1 spray into both nostrils 2 (two) times daily.    Marland Kitchen latanoprost (XALATAN) 0.005 % ophthalmic solution Place 1 drop into both eyes at bedtime.    Marland Kitchen levothyroxine (SYNTHROID, LEVOTHROID) 112 MCG tablet Take 112 mcg by mouth daily before breakfast.    . Magnesium 250 MG TABS Take 1 tablet by mouth 2 (two) times daily.    . Methylcellulose, Laxative, (CITRUCEL PO) Take by mouth as directed. Take 1 tablespoon at night    . metoprolol succinate (TOPROL-XL) 50 MG 24 hr tablet Take one-half tablet by mouth twice a day. Take with or immediately following a meal. 90 tablet 3  . multivitamin (THERAGRAN) per tablet Take 1 tablet by mouth daily.     . nitroGLYCERIN  (NITROSTAT) 0.4 MG SL tablet Place 0.4 mg under the tongue every 5 (five) minutes x 3 doses as needed. For chest pain    . potassium chloride SA (K-DUR,KLOR-CON) 20 MEQ tablet Take 1.5 tablets (30 mEq total) by mouth daily. 90 tablet 3  . pyridOXINE (VITAMIN B-6) 100 MG tablet Take 100 mg by mouth daily.     . Tamsulosin HCl (FLOMAX) 0.4 MG CAPS Take 0.4 mg by mouth daily.     . valsartan (DIOVAN) 80 MG tablet Take 1 tablet (80 mg total) by mouth daily. 30 tablet 6  . furosemide (LASIX) 40 MG tablet Take 1.5 tablets (60 mg total) by mouth daily. 90 tablet 3   No current facility-administered medications for this visit.     Allergies:   Propylene glycol   Social History: Social History   Social History  . Marital status: Married    Spouse name: N/A  .  Number of children: 2  . Years of education: 16   Occupational History  . Retired    Social History Main Topics  . Smoking status: Never Smoker  . Smokeless tobacco: Never Used  . Alcohol use No     Comment: One beer per week.  . Drug use: No  . Sexual activity: Not Currently   Other Topics Concern  . Not on file   Social History Narrative   Lives at home with wife.   1 cup coffee per day.   Right-handed.    Family History: Family History  Problem Relation Age of Onset  . Heart attack Mother   . Heart disease Mother        Heart Disease before age 58  . Heart disease Father        Heart Disease before age 70  . Hypertension Father   . Hyperlipidemia Father   . Heart attack Son 76  . Colon cancer Neg Hx      Review of Systems: All other systems reviewed and are otherwise negative except as noted above.   Physical Exam: VS:  BP 130/72   Pulse 80   Ht _0  (1.727 m)   Wt 182 lb 6.4 oz (82.7 kg)   BMI 27.73 kg/m  , BMI Body mass index is 27.73 kg/m.  GEN- The patient is elderly appearing, alert and oriented x 3 today.   HEENT: normocephalic, atraumatic; sclera clear, conjunctiva pink; hearing intact;  oropharynx clear; neck supple  Lungs- Clear to ausculation bilaterally, normal work of breathing.  No wheezes, rales, rhonchi Heart- Regular rate and rhythm (paced) GI- soft, non-tender, non-distended, bowel sounds present  Extremities- no clubbing, cyanosis, no edema MS- no significant deformity or atrophy Skin- warm and dry, no rash or lesion; PPM pocket well healed Psych- euthymic mood, full affect Neuro- strength and sensation are intact  PPM Interrogation- reviewed in detail today,  See PACEART report  EKG:  EKG is ordered today. EKG today demonstrates sinus rhythm with V pacing  Recent Labs: 05/12/2016: ALT 38 06/01/2016: Brain Natriuretic Peptide 678.7 01/24/2017: BUN 27; Creatinine, Ser 1.13; Hemoglobin 10.3; Magnesium 2.3; Platelets 143; Potassium 4.8; Sodium 140   Wt Readings from Last 3 Encounters:  03/01/17 182 lb 6.4 oz (82.7 kg)  01/24/17 184 lb (83.5 kg)  07/31/16 177 lb 6.4 oz (80.5 kg)     Other studies Reviewed: Additional studies/ records that were reviewed today include: Dr Jackalyn Lombard office notes  Assessment and Plan:  1.  Transient complete heart block Normal PPM function See Pace Art report No changes today  2.  Chronic systolic heart failure Euvolemic on exam Continue current therapy  3.  AS s/p TAVR Follow up with Dr Burt Knack as scheduled  4.  CAD s/p CABG No recent ischemic symptoms Continue current therapy   5.  HTN Stable No change required today  6.  Atrial flutter Seen on device interrogation immediately post TAVR No recurrence since. Will follow. If recurrence, will need to consider [place] for [place] of 5  7.  PVC's Burden by device interrogation today 3.8% Effective CRT pacing 96% He did not tolerate increased dose of BB    Current medicines are reviewed at length with the patient today.   The patient does not have concerns regarding his medicines.  The following changes were made today:  none  Labs/ tests ordered today  include: none Orders Placed This Encounter  Procedures  . [place]  Disposition:   Follow up with Dr [name] as scheduled, [facility], Dr Burt Knack as scheduled    Signed, Chanetta Marshall, NP 03/01/2017 9:06 AM  [facility] [address] [place] [address] [phone] (office) [phone] (fax)

## 2017-03-01 ENCOUNTER — Ambulatory Visit (INDEPENDENT_AMBULATORY_CARE_PROVIDER_SITE_OTHER): Payer: Medicare HMO | Admitting: Nurse Practitioner

## 2017-03-01 VITALS — BP 130/72 | HR 80 | Ht 68.0 in | Wt 182.4 lb

## 2017-03-01 DIAGNOSIS — Z952 Presence of prosthetic heart valve: Secondary | ICD-10-CM | POA: Diagnosis not present

## 2017-03-01 DIAGNOSIS — I5042 Chronic combined systolic (congestive) and diastolic (congestive) heart failure: Secondary | ICD-10-CM

## 2017-03-01 DIAGNOSIS — I4892 Unspecified atrial flutter: Secondary | ICD-10-CM | POA: Diagnosis not present

## 2017-03-01 DIAGNOSIS — I10 Essential (primary) hypertension: Secondary | ICD-10-CM | POA: Diagnosis not present

## 2017-03-01 DIAGNOSIS — I442 Atrioventricular block, complete: Secondary | ICD-10-CM

## 2017-03-01 DIAGNOSIS — I25810 Atherosclerosis of coronary artery bypass graft(s) without angina pectoris: Secondary | ICD-10-CM

## 2017-03-01 DIAGNOSIS — I493 Ventricular premature depolarization: Secondary | ICD-10-CM | POA: Diagnosis not present

## 2017-03-01 LAB — CUP PACEART INCLINIC DEVICE CHECK
Implantable Lead Implant Date: 20170907
Implantable Lead Location: 753859
Implantable Lead Location: 753860
Implantable Lead Model: 4598
Implantable Lead Model: 5076
Implantable Lead Model: 5076
MDC IDC LEAD IMPLANT DT: 20170907
MDC IDC LEAD IMPLANT DT: 20170907
MDC IDC LEAD LOCATION: 753858
MDC IDC PG IMPLANT DT: 20170907
MDC IDC SESS DTM: 20180712084250

## 2017-03-01 NOTE — Patient Instructions (Signed)
Medication Instructions:  Your physician recommends that you continue on your current medications as directed. Please refer to the Current Medication list given to you today.   Labwork: None Ordered   Testing/Procedures: None Ordered   Follow-Up: Your physician wants you to follow-up in: 6 months with Dr. [name]. You will receive a reminder letter in the mail two months in advance. If you don't receive a letter, please call our office to schedule the follow-up appointment.   Remote monitoring is used to monitor your Pacemaker of ICD from home. This monitoring reduces the number of office visits required to check your device to one time per year. It allows [place] to keep an eye on the functioning of your device to ensure it is working properly. You are scheduled for a device check from home on 05/31/17. You may send your transmission at any time that day. If you have a wireless device, the transmission will be sent automatically. After your physician reviews your transmission, you will receive a postcard with your next transmission date.    Any Other Special Instructions Will Be Listed Below (If Applicable).     If you need a refill on your cardiac medications before your next appointment, please call your pharmacy.

## 2017-03-05 DIAGNOSIS — C44219 Basal cell carcinoma of skin of left ear and external auricular canal: Secondary | ICD-10-CM | POA: Diagnosis not present

## 2017-03-05 DIAGNOSIS — Z882 Allergy status to sulfonamides status: Secondary | ICD-10-CM | POA: Diagnosis not present

## 2017-03-06 ENCOUNTER — Other Ambulatory Visit: Payer: Self-pay | Admitting: *Deleted

## 2017-03-06 NOTE — Telephone Encounter (Signed)
Patient called and requested that [facility] refill his valsartan. This was previously filled by Dr Bronson Ing but patient states that he no longer sees him. Okay to refill? Please advise. Thanks, MI

## 2017-03-07 NOTE — Telephone Encounter (Signed)
Please advise is this okay for [place] to refill due to patient no longer seeing the doctor as well as the valsartan medication being recalled.   HC

## 2017-03-14 DIAGNOSIS — Z483 Aftercare following surgery for neoplasm: Secondary | ICD-10-CM | POA: Diagnosis not present

## 2017-03-14 DIAGNOSIS — D649 Anemia, unspecified: Secondary | ICD-10-CM | POA: Diagnosis not present

## 2017-03-14 DIAGNOSIS — Z79899 Other long term (current) drug therapy: Secondary | ICD-10-CM | POA: Diagnosis not present

## 2017-03-14 DIAGNOSIS — M109 Gout, unspecified: Secondary | ICD-10-CM | POA: Diagnosis not present

## 2017-03-14 DIAGNOSIS — I5022 Chronic systolic (congestive) heart failure: Secondary | ICD-10-CM | POA: Diagnosis not present

## 2017-03-14 DIAGNOSIS — C44219 Basal cell carcinoma of skin of left ear and external auricular canal: Secondary | ICD-10-CM | POA: Diagnosis not present

## 2017-03-14 DIAGNOSIS — Z4802 Encounter for removal of sutures: Secondary | ICD-10-CM | POA: Diagnosis not present

## 2017-03-21 DIAGNOSIS — M109 Gout, unspecified: Secondary | ICD-10-CM | POA: Diagnosis not present

## 2017-03-21 DIAGNOSIS — I5022 Chronic systolic (congestive) heart failure: Secondary | ICD-10-CM | POA: Diagnosis not present

## 2017-03-21 DIAGNOSIS — D638 Anemia in other chronic diseases classified elsewhere: Secondary | ICD-10-CM | POA: Diagnosis not present

## 2017-03-23 NOTE — Telephone Encounter (Signed)
THIS RX HAS BEEN REQUESTED BUT HAS NOT BEEN FILLED

## 2017-03-28 DIAGNOSIS — R69 Illness, unspecified: Secondary | ICD-10-CM | POA: Diagnosis not present

## 2017-05-09 ENCOUNTER — Encounter (INDEPENDENT_AMBULATORY_CARE_PROVIDER_SITE_OTHER): Payer: Self-pay

## 2017-05-09 ENCOUNTER — Ambulatory Visit (HOSPITAL_COMMUNITY): Payer: Medicare HMO | Attending: Cardiovascular Disease

## 2017-05-09 ENCOUNTER — Other Ambulatory Visit: Payer: Self-pay

## 2017-05-09 ENCOUNTER — Encounter: Payer: Self-pay | Admitting: Cardiovascular Disease

## 2017-05-09 ENCOUNTER — Ambulatory Visit (INDEPENDENT_AMBULATORY_CARE_PROVIDER_SITE_OTHER): Payer: Medicare HMO | Admitting: Cardiovascular Disease

## 2017-05-09 VITALS — BP 132/80 | HR 64 | Ht 68.0 in | Wt 187.8 lb

## 2017-05-09 DIAGNOSIS — I350 Nonrheumatic aortic (valve) stenosis: Secondary | ICD-10-CM | POA: Diagnosis not present

## 2017-05-09 DIAGNOSIS — I2510 Atherosclerotic heart disease of native coronary artery without angina pectoris: Secondary | ICD-10-CM | POA: Diagnosis not present

## 2017-05-09 DIAGNOSIS — I119 Hypertensive heart disease without heart failure: Secondary | ICD-10-CM | POA: Diagnosis not present

## 2017-05-09 DIAGNOSIS — I442 Atrioventricular block, complete: Secondary | ICD-10-CM | POA: Insufficient documentation

## 2017-05-09 DIAGNOSIS — E785 Hyperlipidemia, unspecified: Secondary | ICD-10-CM | POA: Insufficient documentation

## 2017-05-09 DIAGNOSIS — R29898 Other symptoms and signs involving the musculoskeletal system: Secondary | ICD-10-CM | POA: Insufficient documentation

## 2017-05-09 DIAGNOSIS — I5042 Chronic combined systolic (congestive) and diastolic (congestive) heart failure: Secondary | ICD-10-CM

## 2017-05-09 DIAGNOSIS — Z952 Presence of prosthetic heart valve: Secondary | ICD-10-CM | POA: Diagnosis not present

## 2017-05-09 DIAGNOSIS — I340 Nonrheumatic mitral (valve) insufficiency: Secondary | ICD-10-CM | POA: Insufficient documentation

## 2017-05-09 DIAGNOSIS — I472 Ventricular tachycardia: Secondary | ICD-10-CM | POA: Insufficient documentation

## 2017-05-09 NOTE — Progress Notes (Signed)
Cardiology Office Note Date:  05/09/2017   ID:  [name], DOB 06-02-1931, MRN [id number]  PCP:  [name], MD  Cardiologist:  [name], MD    Chief Complaint  Patient presents with  . Follow-up    aortic valve disease   History of Present Illness: [name] is a 83 y.o. male who presents for follow-up of aortic valve disease.  The patient underwent TAVR in September 2017. He was treated with a 29 mm Edwards Sapien 3 transcatheter heart valve via a percutaneous right femoral approach. He initially presented with exertional dyspnea and syncope. He ultimately was found to have severe aortic stenosis as well as complete AV block. He underwent PPM placement prior to undergoing TAVR. Other comorbid conditions include the coronary artery disease with history of CABG in 2010 and vein graft PCI most recently in March 2017.  Here with his wife today. He continues to exercise nearly every day. He is able to work out for 45 minutes. He complains of mild exercise intolerance, usually when he is on the treadmill, but this occurs sporadically. The past 2 days he hasn't had any symptoms with exercise. Correlates symptoms with how he times his medications. Feels worse when he takes his medications all at the same time. Overall he feels like he is making improvement with his exercise program.  Past Medical History:  Diagnosis Date  . Arthritis   . Benign prostatic hypertrophy   . CAD (coronary artery disease)    DES, SVG to circumflex marginal, October, 2010 ( SVG to PDA and PLA patent , LIMA to LAD patent, EF 60%, mild inferior hypokinesis  . Complete heart block ([place])    a. s/p MDT CRTP 04/2016 Dr [name]  . GERD (gastroesophageal reflux disease)   . HTN (hypertension)   . Hyperlipidemia   . Hypothyroidism   . Nephrolithiasis   . Neuromuscular disorder ([place])   . Peripheral neuropathy   . Psoriasis    severe; with total skin exfoliation in the past resolved  . S/P TAVR  (transcatheter aortic valve replacement)    severe AS >> s/p TAVR 9/17 // Limited Echo 10/17: EF 40-45%, lat and inf-lat HK, TAVR ok, MAC, mild BAE, PASP 31 mmHg, no effusion  . Ventricular tachycardia, non-sustained [facility])     Past Surgical History:  Procedure Laterality Date  . APPENDECTOMY    . bilateral L3-4 laminectomies    . CARDIAC CATHETERIZATION N/A 11/17/2015   Procedure: Right/Left Heart Cath and Coronary Angiography;  Surgeon: Peter M Martinique, MD;  Location: [place] CV LAB;  Service: Cardiovascular;  Laterality: N/A;  . CARDIAC CATHETERIZATION N/A 11/17/2015   Procedure: Coronary Stent Intervention;  Surgeon: Peter M Martinique, MD;  Location: [place] CV LAB;  Service: Cardiovascular;  Laterality: N/A;  . CARDIAC CATHETERIZATION N/A 04/19/2016   Procedure: Right/Left Heart Cath and Coronary/Graft Angiography;  Surgeon: [name], MD;  Location: [place] CV LAB;  Service: Cardiovascular;  Laterality: N/A;  . CAROTID ENDARTERECTOMY Left 02-24-08   cea  Dr. Amedeo Plenty  . CARPAL TUNNEL RELEASE Bilateral 2010  . CATARACT EXTRACTION, BILATERAL    . COLONOSCOPY  11/23/2011   Procedure: COLONOSCOPY;  Surgeon: Rogene Houston, MD;  [facility];  Service: Endoscopy;  Laterality: N/A;  730  . CORONARY ANGIOPLASTY     2 stents  . CORONARY ARTERY BYPASS GRAFT  1995   x 3  . CORONARY STENT PLACEMENT  11/17/2015   SVG   DES  to RCA  . decompression of left median nerve    . EP IMPLANTABLE DEVICE N/A 04/27/2016   MDT CRTP implanted by Dr [name]  . JOINT REPLACEMENT Left Sept. 27, 2013   Knee  . left carotid endarectomy and Dacron patch angioplasty]    . LEFT HEART CATHETERIZATION WITH CORONARY ANGIOGRAM N/A 12/27/2012   Procedure: LEFT HEART CATHETERIZATION WITH CORONARY ANGIOGRAM;  Surgeon: Burnell Blanks, MD;  Location: [facility] CATH LAB;  Service: Cardiovascular;  Laterality: N/A;  . posterior arthrodesis with autograft and allograft    . removal of synovial cyst    . TEE  WITHOUT CARDIOVERSION N/A 04/21/2016   Procedure: TRANSESOPHAGEAL ECHOCARDIOGRAM (TEE);  Surgeon: Pixie Casino, MD;  Location: [facility] ENDOSCOPY;  Service: Cardiovascular;  Laterality: N/A;  . TEE WITHOUT CARDIOVERSION N/A 05/16/2016   Procedure: TRANSESOPHAGEAL ECHOCARDIOGRAM (TEE);  Surgeon: [name], MD;  Location: [place];  Service: Open Heart Surgery;  Laterality: N/A;  . TONSILLECTOMY    . TOTAL KNEE ARTHROPLASTY  04/16/2012   Procedure: TOTAL KNEE ARTHROPLASTY;  Surgeon: Garald Balding, MD;  Location: [place];  Service: Orthopedics;  Laterality: Left;  . TRANSCATHETER AORTIC VALVE REPLACEMENT, TRANSFEMORAL N/A 05/16/2016   Procedure: TRANSCATHETER AORTIC VALVE REPLACEMENT, TRANSFEMORAL;  Surgeon: [name], MD;  Location: [place];  Service: Open Heart Surgery;  Laterality: N/A;    Current Outpatient Prescriptions  Medication Sig Dispense Refill  . allopurinol (ZYLOPRIM) 300 MG tablet Take 600 mg by mouth daily.     Marland Kitchen amoxicillin (AMOXIL) 500 MG tablet Take 4 tablets (2,000 mg total) by mouth as needed. Take 4 of the 500 mg tablets = 2000 mg 60 minutes before dental procedure 4 tablet 2  . atorvastatin (LIPITOR) 20 MG tablet Take 20 mg by mouth every evening.     . clobetasol cream (TEMOVATE) 3.53 % Apply 1 application topically 2 (two) times daily as needed (SKIN).     Marland Kitchen clopidogrel (PLAVIX) 75 MG tablet Take 1 tablet (75 mg total) by mouth daily with breakfast. 90 tablet 3  . Docusate Sodium (COLACE PO) Take 2 tablets by mouth daily.    . fluticasone (FLONASE) 50 MCG/ACT nasal spray Place 1 spray into both nostrils 2 (two) times daily.    . furosemide (LASIX) 40 MG tablet Take 1.5 tablets (60 mg total) by mouth daily. 90 tablet 3  . latanoprost (XALATAN) 0.005 % ophthalmic solution Place 1 drop into both eyes at bedtime.    Marland Kitchen levothyroxine (SYNTHROID, LEVOTHROID) 112 MCG tablet Take 112 mcg by mouth daily before breakfast.    . losartan (COZAAR) 100 MG tablet Take 100 mg by mouth  daily.    . Magnesium 250 MG TABS Take 1 tablet by mouth 2 (two) times daily.    . Methylcellulose, Laxative, (CITRUCEL PO) Take by mouth as directed. Take 1 tablespoon at night    . metoprolol succinate (TOPROL-XL) 50 MG 24 hr tablet Take one-half tablet by mouth twice a day. Take with or immediately following a meal. 90 tablet 3  . multivitamin (THERAGRAN) per tablet Take 1 tablet by mouth daily.     . nitroGLYCERIN (NITROSTAT) 0.4 MG SL tablet Place 0.4 mg under the tongue every 5 (five) minutes x 3 doses as needed. For chest pain    . potassium chloride SA (K-DUR,KLOR-CON) 20 MEQ tablet Take 1.5 tablets (30 mEq total) by mouth daily. 90 tablet 3  . pyridOXINE (VITAMIN B-6) 100 MG tablet Take 100 mg by mouth daily.     Marland Kitchen  Tamsulosin HCl (FLOMAX) 0.4 MG CAPS Take 0.4 mg by mouth daily.      No current facility-administered medications for this visit.     Allergies:   Propylene glycol   Social History:  The patient  reports that he has never smoked. He has never used smokeless tobacco. He reports that he does not drink alcohol or use drugs.   Family History:  The patient's family history includes Heart attack in his mother; Heart attack (age of onset: 76) in his son; Heart disease in his father and mother; Hyperlipidemia in his father; Hypertension in his father.    ROS:  Please see the history of present illness.  Otherwise, review of systems is positive for dizziness.  All other systems are reviewed and negative.    PHYSICAL EXAM: VS:  BP 132/80   Pulse 64   Ht 5' 8"  (1.727 m)   Wt 85.2 kg (187 lb 12.8 oz)   SpO2 97%   BMI 28.55 kg/m  , BMI Body mass index is 28.55 kg/m. GEN: Well nourished, well developed, pleasant elderly male in no acute distress  HEENT: normal  Neck: no JVD, no masses. No carotid bruits Cardiac: RRR without murmur or gallop, paradoxical S2  Respiratory:  clear to auscultation bilaterally, normal work of breathing GI: soft, nontender, nondistended, + BS MS:  no deformity or atrophy  Ext: no pretibial edema, pedal pulses 2+= bilaterally Skin: warm and dry, no rash Neuro:  Strength and sensation are intact Psych: euthymic mood, full affect  EKG:  EKG is not ordered today.  Recent Labs: 05/12/2016: ALT 38 06/01/2016: Brain Natriuretic Peptide 678.7 01/24/2017: BUN 27; Creatinine, Ser 1.13; Hemoglobin 10.3; Magnesium 2.3; Platelets 143; Potassium 4.8; Sodium 140   Lipid Panel     Component Value Date/Time   CHOL 143 08/06/2006 0814   TRIG 141 08/06/2006 0814   HDL 29.7 (L) 08/06/2006 0814   CHOLHDL 4.8 CALC 08/06/2006 0814   VLDL 28 08/06/2006 0814   LDLCALC 85 08/06/2006 0814      Wt Readings from Last 3 Encounters:  05/09/17 85.2 kg (187 lb 12.8 oz)  03/01/17 82.7 kg (182 lb 6.4 oz)  01/24/17 83.5 kg (184 lb)     Cardiac Studies Reviewed: 2D Echo 05/09/2017: Left ventricle:  Inferior akinesis The cavity size was moderately dilated. Wall thickness was increased in a pattern of mild LVH. The estimated ejection fraction was 40%. Doppler parameters are consistent with both elevated ventricular end-diastolic filling pressure and elevated left atrial filling pressure.  ------------------------------------------------------------------- Aortic valve:  Post TAVR with 29 mm Sapien 3 valve no peri valvular regurgitation stable gradients.  Doppler:     VTI ratio of LVOT to aortic valve: 0.41. Peak velocity ratio of LVOT to aortic valve: 0.37. Mean velocity ratio of LVOT to aortic valve: 0.37.    Mean gradient (S): 6 mm Hg. Peak gradient (S): 12 mm Hg.  ------------------------------------------------------------------- Aorta:  The aorta was normal, not dilated, and non-diseased.  ------------------------------------------------------------------- Mitral valve:   Calcified annulus.  Doppler:  There was mild regurgitation.    Peak gradient (D): 4 mm Hg.  ------------------------------------------------------------------- Left  atrium:  The atrium was moderately dilated.  ------------------------------------------------------------------- Atrial septum:  No defect or patent foramen ovale was identified.   ------------------------------------------------------------------- Right ventricle:  The cavity size was normal. Wall thickness was normal. Pacer wire or catheter noted in right ventricle. Systolic function was normal.  ------------------------------------------------------------------- Tricuspid valve:   Doppler:  There was mild regurgitation.  ------------------------------------------------------------------- Right  atrium:  The atrium was mildly dilated.  ------------------------------------------------------------------- Pericardium:  The pericardium was normal in appearance.  ------------------------------------------------------------------- Systemic veins: Inferior vena cava: The vessel was normal in size. The respirophasic diameter changes were in the normal range (>= 50%), consistent with normal central venous pressure.  ------------------------------------------------------------------- Post procedure conclusions Ascending Aorta:  - The aorta was normal, not dilated, and non-diseased.  ASSESSMENT AND PLAN: 1.  Aortic valve disease status post TAVR: The patient's echocardiogram is reviewed and shows normal function of his transcatheter heart valve with normal gradients and no paravalvular regurgitation. He has New York Heart Association functional class II symptoms. Current medications will be continued.  2. Coronary artery disease, native vessel, without angina: The patient is status post CABG and multiple PCI procedures. He remains on antiplatelet therapy with clopidogrel. He is counseled to watch for progressive symptoms, but appears to be doing just fine at the present. I would like to see him back in 6 months for follow-up evaluation.  3. Chronic combined diastolic and systolic  heart failure, New York Heart Association functional class II symptoms. LVEF remains moderately depressed at 40%. He is on a combination of metoprolol succinate and losartan. He was changed from valsartan and it sounds like he is taking losartan 100 mg daily. We are going to call the pharmacy to verify.  Current medicines are reviewed with the patient today.  The patient does not have concerns regarding medicines.  Labs/ tests ordered today include:  No orders of the defined types were placed in this encounter.   Disposition:   FU 6 months after he returns from [place], [name], MD  05/09/2017 4:47 PM    [address], [place], [place]  [address] Phone: [phone]; Fax: [phone]

## 2017-05-09 NOTE — Patient Instructions (Signed)

## 2017-05-11 ENCOUNTER — Encounter: Payer: Self-pay | Admitting: Thoracic Surgery (Cardiothoracic Vascular Surgery)

## 2017-05-11 ENCOUNTER — Other Ambulatory Visit: Payer: Self-pay

## 2017-05-11 DIAGNOSIS — I779 Disorder of arteries and arterioles, unspecified: Secondary | ICD-10-CM

## 2017-05-11 DIAGNOSIS — I739 Peripheral vascular disease, unspecified: Principal | ICD-10-CM

## 2017-05-11 DIAGNOSIS — Z9889 Other specified postprocedural states: Secondary | ICD-10-CM

## 2017-05-14 ENCOUNTER — Encounter: Payer: Self-pay | Admitting: Family

## 2017-05-14 ENCOUNTER — Ambulatory Visit (HOSPITAL_COMMUNITY)
Admission: RE | Admit: 2017-05-14 | Discharge: 2017-05-14 | Disposition: A | Discharge status: Home / Self Care | Payer: Medicare HMO | Source: Ambulatory Visit | Attending: Family | Admitting: Family

## 2017-05-14 ENCOUNTER — Ambulatory Visit (INDEPENDENT_AMBULATORY_CARE_PROVIDER_SITE_OTHER): Payer: Medicare HMO | Admitting: Family

## 2017-05-14 VITALS — BP 145/75 | HR 71 | Temp 97.0°F | Resp 16 | Ht 68.0 in | Wt 182.0 lb

## 2017-05-14 DIAGNOSIS — I779 Disorder of arteries and arterioles, unspecified: Secondary | ICD-10-CM | POA: Diagnosis not present

## 2017-05-14 DIAGNOSIS — I6523 Occlusion and stenosis of bilateral carotid arteries: Secondary | ICD-10-CM

## 2017-05-14 DIAGNOSIS — Z9889 Other specified postprocedural states: Secondary | ICD-10-CM

## 2017-05-14 DIAGNOSIS — I739 Peripheral vascular disease, unspecified: Secondary | ICD-10-CM

## 2017-05-14 DIAGNOSIS — I872 Venous insufficiency (chronic) (peripheral): Secondary | ICD-10-CM | POA: Diagnosis not present

## 2017-05-14 LAB — VAS US CAROTID
LCCADDIAS: 25 cm/s
LICADSYS: -100 cm/s
LICAPDIAS: -16 cm/s
LICAPSYS: -92 cm/s
Left CCA dist sys: 112 cm/s
Left CCA prox dias: 19 cm/s
Left CCA prox sys: 95 cm/s
Left ICA dist dias: -35 cm/s
RCCAPSYS: 58 cm/s
RIGHT CCA MID DIAS: 14 cm/s
RIGHT ECA DIAS: 13 cm/s
Right CCA prox dias: 12 cm/s
Right cca dist sys: -63 cm/s

## 2017-05-14 NOTE — Progress Notes (Signed)
Chief Complaint: Follow up Extracranial Carotid Artery Stenosis   History of Present Illness  [name] is a 83 y.o. male patient of Dr. Trula Slade who is status post left CEA in 2009.  He returns today for scheduled surveillance. States he exercises 4-5 days/week on a treadmill and other exercises, plays golf twice/week.  The patient denies any history of TIA or stroke symptoms, specifically he denies a history of amaurosis fugax or monocular blindness, unilateralfacial drooping,  hemiplegia, or receptive or expressive aphasia.  I last evaluated pt on 06-30-15. At that time carotid duplex suggested a patent left CEA site and minimal stenosis of the right ICA. No significant change compared to 07/07/14.  I advised ollow-up in 1 year with Carotid Duplex.  He denies claudication symptoms with walking, denies non healing wounds.   He had a pacemaker insertion on 04-27-16, Dr. [name]. He had aortic valve replacement on 05-16-16 by Dr. Burt Knack.   He reports gradual worsening of numbness in lower legs over the last few years; states he was told that he has neuropathy, was told by his neurologist that he has lumbar spinal stenosis that may be contributing to his neuropathy. In addition, he had severe generalized psoriasis in 2002 that has since resolved with Enbrel treatment, states "my feet split open", this may also have contributed to his lower legs neuropathy. He had a left knee replacement in 2013.  Pt Diabetic: pre-diabetes, states a recent A1C was 6.1 Pt smoker: non-smoker  Pt meds include: Statin : Yes Betablocker: yes ASA: no Other anticoagulants/antiplatelets: Plavix since aortic valve replaced   Past Medical History:  Diagnosis Date  . Arthritis   . Benign prostatic hypertrophy   . CAD (coronary artery disease)    DES, SVG to circumflex marginal, October, 2010 ( SVG to PDA and PLA patent , LIMA to LAD patent, EF 60%, mild inferior hypokinesis  . Complete heart  block ([place])    a. s/p MDT CRTP 04/2016 Dr [name]  . GERD (gastroesophageal reflux disease)   . HTN (hypertension)   . Hyperlipidemia   . Hypothyroidism   . Nephrolithiasis   . Neuromuscular disorder ([place])   . Peripheral neuropathy   . Psoriasis    severe; with total skin exfoliation in the past resolved  . S/P TAVR (transcatheter aortic valve replacement)    severe AS >> s/p TAVR 9/17 // Limited Echo 10/17: EF 40-45%, lat and inf-lat HK, TAVR ok, MAC, mild BAE, PASP 31 mmHg, no effusion  . Ventricular tachycardia, non-sustained [facility])     Social History Social History  Substance Use Topics  . Smoking status: Never Smoker  . Smokeless tobacco: Never Used  . Alcohol use No     Comment: One beer per week.    Family History Family History  Problem Relation Age of Onset  . Heart attack Mother   . Heart disease Mother        Heart Disease before age 49  . Heart disease Father        Heart Disease before age 80  . Hypertension Father   . Hyperlipidemia Father   . Heart attack Son 51  . Colon cancer Neg Hx     Surgical History Past Surgical History:  Procedure Laterality Date  . APPENDECTOMY    . bilateral L3-4 laminectomies    . CARDIAC CATHETERIZATION N/A 11/17/2015   Procedure: Right/Left Heart Cath and Coronary Angiography;  Surgeon: Peter M Martinique, MD;  Location: [place] CV LAB;  Service: Cardiovascular;  Laterality: N/A;  . CARDIAC CATHETERIZATION N/A 11/17/2015   Procedure: Coronary Stent Intervention;  Surgeon: Peter M Martinique, MD;  Location: [place] CV LAB;  Service: Cardiovascular;  Laterality: N/A;  . CARDIAC CATHETERIZATION N/A 04/19/2016   Procedure: Right/Left Heart Cath and Coronary/Graft Angiography;  Surgeon: [name], MD;  Location: [place] CV LAB;  Service: Cardiovascular;  Laterality: N/A;  . CAROTID ENDARTERECTOMY Left 02-24-08   cea  Dr. Amedeo Plenty  . CARPAL TUNNEL RELEASE Bilateral 2010  . CATARACT EXTRACTION, BILATERAL    . COLONOSCOPY   11/23/2011   Procedure: COLONOSCOPY;  Surgeon: Rogene Houston, MD;  [facility];  Service: Endoscopy;  Laterality: N/A;  730  . CORONARY ANGIOPLASTY     2 stents  . CORONARY ARTERY BYPASS GRAFT  1995   x 3  . CORONARY STENT PLACEMENT  11/17/2015   SVG   DES to RCA  . decompression of left median nerve    . EP IMPLANTABLE DEVICE N/A 04/27/2016   MDT CRTP implanted by Dr [name]  . JOINT REPLACEMENT Left Sept. 27, 2013   Knee  . left carotid endarectomy and Dacron patch angioplasty]    . LEFT HEART CATHETERIZATION WITH CORONARY ANGIOGRAM N/A 12/27/2012   Procedure: LEFT HEART CATHETERIZATION WITH CORONARY ANGIOGRAM;  Surgeon: Burnell Blanks, MD;  Location: [facility] CATH LAB;  Service: Cardiovascular;  Laterality: N/A;  . posterior arthrodesis with autograft and allograft    . removal of synovial cyst    . TEE WITHOUT CARDIOVERSION N/A 04/21/2016   Procedure: TRANSESOPHAGEAL ECHOCARDIOGRAM (TEE);  Surgeon: Pixie Casino, MD;  Location: [facility] ENDOSCOPY;  Service: Cardiovascular;  Laterality: N/A;  . TEE WITHOUT CARDIOVERSION N/A 05/16/2016   Procedure: TRANSESOPHAGEAL ECHOCARDIOGRAM (TEE);  Surgeon: [name], MD;  Location: [place];  Service: Open Heart Surgery;  Laterality: N/A;  . TONSILLECTOMY    . TOTAL KNEE ARTHROPLASTY  04/16/2012   Procedure: TOTAL KNEE ARTHROPLASTY;  Surgeon: Garald Balding, MD;  Location: [place];  Service: Orthopedics;  Laterality: Left;  . TRANSCATHETER AORTIC VALVE REPLACEMENT, TRANSFEMORAL N/A 05/16/2016   Procedure: TRANSCATHETER AORTIC VALVE REPLACEMENT, TRANSFEMORAL;  Surgeon: [name], MD;  Location: [place];  Service: Open Heart Surgery;  Laterality: N/A;    Allergies  Allergen Reactions  . Propylene Glycol Hives and Rash    Current Outpatient Prescriptions  Medication Sig Dispense Refill  . allopurinol (ZYLOPRIM) 300 MG tablet Take 600 mg by mouth daily.     Marland Kitchen amoxicillin (AMOXIL) 500 MG tablet Take 4 tablets (2,000 mg total) by mouth as  needed. Take 4 of the 500 mg tablets = 2000 mg 60 minutes before dental procedure (Patient taking differently: Take 300 mg by mouth as needed. Take 4 of the 500 mg tablets = 2000 mg 60 minutes before dental procedure) 4 tablet 2  . atorvastatin (LIPITOR) 20 MG tablet Take 20 mg by mouth every evening.     . clobetasol cream (TEMOVATE) 6.94 % Apply 1 application topically 2 (two) times daily as needed (SKIN).     Marland Kitchen clopidogrel (PLAVIX) 75 MG tablet Take 1 tablet (75 mg total) by mouth daily with breakfast. 90 tablet 3  . Docusate Sodium (COLACE PO) Take 2 tablets by mouth daily.    . fluticasone (FLONASE) 50 MCG/ACT nasal spray Place 1 spray into both nostrils 2 (two) times daily.    Marland Kitchen latanoprost (XALATAN) 0.005 % ophthalmic solution Place 1 drop into both eyes at bedtime.    Marland Kitchen  levothyroxine (SYNTHROID, LEVOTHROID) 112 MCG tablet Take 112 mcg by mouth daily before breakfast.    . losartan (COZAAR) 100 MG tablet Take 100 mg by mouth daily.    . Magnesium 250 MG TABS Take 1 tablet by mouth 2 (two) times daily.    . Methylcellulose, Laxative, (CITRUCEL PO) Take by mouth as directed. Take 1 tablespoon at night    . metoprolol succinate (TOPROL-XL) 50 MG 24 hr tablet Take one-half tablet by mouth twice a day. Take with or immediately following a meal. 90 tablet 3  . multivitamin (THERAGRAN) per tablet Take 1 tablet by mouth daily.     . nitroGLYCERIN (NITROSTAT) 0.4 MG SL tablet Place 0.4 mg under the tongue every 5 (five) minutes x 3 doses as needed. For chest pain    . potassium chloride SA (K-DUR,KLOR-CON) 20 MEQ tablet Take 1.5 tablets (30 mEq total) by mouth daily. 90 tablet 3  . pyridOXINE (VITAMIN B-6) 100 MG tablet Take 100 mg by mouth daily.     . Tamsulosin HCl (FLOMAX) 0.4 MG CAPS Take 0.4 mg by mouth daily.     . furosemide (LASIX) 40 MG tablet Take 1.5 tablets (60 mg total) by mouth daily. 90 tablet 3   No current facility-administered medications for this visit.     Review of Systems :  See HPI for pertinent positives and negatives.  Physical Examination  Vitals:   05/14/17 1150 05/14/17 1152  BP: (!) 143/73 (!) 145/75  Pulse: 71   Resp: 16   Temp: (!) 97 F (36.1 C)   TempSrc: Oral   SpO2: 93%   Weight: 182 lb (82.6 kg)   Height: _0  (1.727 m)    Body mass index is 27.67 kg/m.  General: WDWN male in NAD GAIT: normal Eyes: PERRLA Pulmonary: CTAB, no rales, rhonchi, or wheezes.  Cardiac: regular rhythm with frequent premature contractions, + prosthetic valve click. Pacemaker left upper chest.   VASCULAR EXAM Carotid Bruits Left Right   negative positive    Aorta is not palpable. Radial pulses are 2+ palpable and equal.      LE Pulses LEFT RIGHT   POPLITEAL not palpable  not palpable   POSTERIOR TIBIAL not palpable  not palpable    DORSALIS PEDIS  ANTERIOR TIBIAL Faintly palpable  Faintly palpable     Gastrointestinal: soft, nontender, BS WNL, no r/g, no palpated masses.  Musculoskeletal: No muscle atrophy/wasting. M/S 5/5 un UE, 4/5 in LE, Extremities without ischemic changes. 1-2+ pitting edema in both ankles.   Neurologic: A&O X 3; Appropriate Affect, Speech is normal CN 2-12 intact except is somewhat hard of hearing, Pain and light touch intact in extremities, Motor exam as listed above    Assessment: [name] is a 83 y.o. male who is status post left CEA in 2009. He has no history of stroke of TIA.   He has chronic venous insuffiency with 1-2+ pitting edema in his ankles that resolves with elevation of his lower extremities overnight; see Patient Instructions.   DATA Carotid Duplex (05/14/17): Right ICA: 1 - 39 % stenosis.  Left ICA: patent CEA site with no evidence of restenosis.  Bilateral vertebral artery flow is antegrade.   Bilateral subclavian artery waveforms are normal.  No significant change compared to exams on 07/07/14 and 06/30/15.   Plan: Follow-up in 1 year with Carotid Duplex scan.   I discussed in depth with the patient the nature of atherosclerosis, and emphasized the importance of maximal medical  management including strict control of blood pressure, blood glucose, and lipid levels, obtaining regular exercise, and continued cessation of smoking.  The patient is aware that without maximal medical management the underlying atherosclerotic disease process will progress, limiting the benefit of any interventions. The patient was given information about stroke prevention and what symptoms should prompt the patient to seek immediate medical care. Thank you for allowing [place] to participate in this patient's care.  Clemon Chambers, RN, MSN, FNP-C [facility] Office: [facility] Physician: Trula Slade  05/14/17 11:58 AM

## 2017-05-14 NOTE — Patient Instructions (Addendum)
Stroke Prevention Some medical conditions and behaviors are associated with an increased chance of having a stroke. You may prevent a stroke by making healthy choices and managing medical conditions. How can I reduce my risk of having a stroke?  Stay physically active. Get at least 30 minutes of activity on most or all days.  Do not smoke. It may also be helpful to avoid exposure to secondhand smoke.  Limit alcohol use. Moderate alcohol use is considered to be: ? No more than 2 drinks per day for men. ? No more than 1 drink per day for nonpregnant women.  Eat healthy foods. This involves: ? Eating 5 or more servings of fruits and vegetables a day. ? Making dietary changes that address high blood pressure (hypertension), high cholesterol, diabetes, or obesity.  Manage your cholesterol levels. ? Making food choices that are high in fiber and low in saturated fat, trans fat, and cholesterol may control cholesterol levels. ? Take any prescribed medicines to control cholesterol as directed by your health care provider.  Manage your diabetes. ? Controlling your carbohydrate and sugar intake is recommended to manage diabetes. ? Take any prescribed medicines to control diabetes as directed by your health care provider.  Control your hypertension. ? Making food choices that are low in salt (sodium), saturated fat, trans fat, and cholesterol is recommended to manage hypertension. ? Ask your health care provider if you need treatment to lower your blood pressure. Take any prescribed medicines to control hypertension as directed by your health care provider. ? If you are 18-39 years of age, have your blood pressure checked every 3-5 years. If you are 40 years of age or older, have your blood pressure checked every year.  Maintain a healthy weight. ? Reducing calorie intake and making food choices that are low in sodium, saturated fat, trans fat, and cholesterol are recommended to manage  weight.  Stop drug abuse.  Avoid taking birth control pills. ? Talk to your health care provider about the risks of taking birth control pills if you are over 35 years old, smoke, get migraines, or have ever had a blood clot.  Get evaluated for sleep disorders (sleep apnea). ? Talk to your health care provider about getting a sleep evaluation if you snore a lot or have excessive sleepiness.  Take medicines only as directed by your health care provider. ? For some people, aspirin or blood thinners (anticoagulants) are helpful in reducing the risk of forming abnormal blood clots that can lead to stroke. If you have the irregular heart rhythm of atrial fibrillation, you should be on a blood thinner unless there is a good reason you cannot take them. ? Understand all your medicine instructions.  Make sure that other conditions (such as anemia or atherosclerosis) are addressed. Get help right away if:  You have sudden weakness or numbness of the face, arm, or leg, especially on one side of the body.  Your face or eyelid droops to one side.  You have sudden confusion.  You have trouble speaking (aphasia) or understanding.  You have sudden trouble seeing in one or both eyes.  You have sudden trouble walking.  You have dizziness.  You have a loss of balance or coordination.  You have a sudden, severe headache with no known cause.  You have new chest pain or an irregular heartbeat. Any of these symptoms may represent a serious problem that is an emergency. Do not wait to see if the symptoms will go away.   Get medical help at once. Call your local emergency services (911 in [place] Do not drive yourself to the hospital. This information is not intended to replace advice given to you by your health care provider. Make sure you discuss any questions you have with your health care provider. Document Released: 09/14/2004 Document Revised: 01/13/2016 Document Reviewed: 02/07/2013 Elsevier  Interactive Patient Education  2017 Elsevier Inc.   To measure for knee high compression hose: Measure the length of calf (from the crease of the knee to the bottom of the heel), largest circumference of calf, and ankle circumference first thing in the morning before your legs have a chance to swell.  Take these 3 measurements with you to obtain 20-30 mm mercury graduated knee high compression hose.  Put the stockings on in the morning, remove at bedtime.       Chronic Venous Insufficiency Chronic venous insufficiency, also called venous stasis, is a condition that prevents blood from being pumped effectively through the veins in your legs. Blood may no longer be pumped effectively from the legs back to the heart. This condition can range from mild to severe. With proper treatment, you should be able to continue with an active life. What are the causes? Chronic venous insufficiency occurs when the vein walls become stretched, weakened, or damaged, or when valves within the vein are damaged. Some common causes of this include:  High blood pressure inside the veins (venous hypertension).  Increased blood pressure in the leg veins from long periods of sitting or standing.  A blood clot that blocks blood flow in a vein (deep vein thrombosis, DVT).  Inflammation of a vein (phlebitis) that causes a blood clot to form.  Tumors in the pelvis that cause blood to back up.  What increases the risk? The following factors may make you more likely to develop this condition:  Having a family history of this condition.  Obesity.  Pregnancy.  Living without enough physical activity or exercise (sedentary lifestyle).  Smoking.  Having a job that requires long periods of standing or sitting in one place.  Being a certain age. Women in their 4s and 6s and men in their 41s are more likely to develop this condition.  What are the signs or symptoms? Symptoms of this condition  include:  Veins that are enlarged, bulging, or twisted (varicose veins).  Skin breakdown or ulcers.  Reddened or discolored skin on the front of the leg.  Brown, smooth, tight, and painful skin just above the ankle, usually on the inside of the leg (lipodermatosclerosis).  Swelling.  How is this diagnosed? This condition may be diagnosed based on:  Your medical history.  A physical exam.  Tests, such as: ? A procedure that creates an image of a blood vessel and nearby organs and provides information about blood flow through the blood vessel (duplex ultrasound). ? A procedure that tests blood flow (plethysmography). ? A procedure to look at the veins using X-ray and dye (venogram).  How is this treated? The goals of treatment are to help you return to an active life and to minimize pain or disability. Treatment depends on the severity of your condition, and it may include:  Wearing compression stockings. These can help relieve symptoms and help prevent your condition from getting worse. However, they do not cure the condition.  Sclerotherapy. This is a procedure involving an injection of a material that "dissolves" damaged veins.  Surgery. This may involve: ? Removing a diseased vein (vein stripping). ?  Cutting off blood flow through the vein (laser ablation surgery). ? Repairing a valve.  Follow these instructions at home:  Wear compression stockings as told by your health care provider. These stockings help to prevent blood clots and reduce swelling in your legs.  Take over-the-counter and prescription medicines only as told by your health care provider.  Stay active by exercising, walking, or doing different activities. Ask your health care provider what activities are safe for you and how much exercise you need.  Drink enough fluid to keep your urine clear or pale yellow.  Do not use any products that contain nicotine or tobacco, such as cigarettes and e-cigarettes.  If you need help quitting, ask your health care provider.  Keep all follow-up visits as told by your health care provider. This is important. Contact a health care provider if:  You have redness, swelling, or more pain in the affected area.  You see a red streak or line that extends up or down from the affected area.  You have skin breakdown or a loss of skin in the affected area, even if the breakdown is small.  You get an injury in the affected area. Get help right away if:  You get an injury and an open wound in the affected area.  You have severe pain that does not get better with medicine.  You have sudden numbness or weakness in the foot or ankle below the affected area, or you have trouble moving your foot or ankle.  You have a fever and you have worse or persistent symptoms.  You have chest pain.  You have shortness of breath. Summary  Chronic venous insufficiency, also called venous stasis, is a condition that prevents blood from being pumped effectively through the veins in your legs.  Chronic venous insufficiency occurs when the vein walls become stretched, weakened, or damaged, or when valves within the vein are damaged.  Treatment for this condition depends on how severe your condition is, and it may involve wearing compression stockings or having a procedure.  Make sure you stay active by exercising, walking, or doing different activities. Ask your health care provider what activities are safe for you and how much exercise you need. This information is not intended to replace advice given to you by your health care provider. Make sure you discuss any questions you have with your health care provider. Document Released: 12/11/2006 Document Revised: 06/26/2016 Document Reviewed: 06/26/2016 Elsevier Interactive Patient Education  2017 [employer].

## 2017-05-23 DIAGNOSIS — L2389 Allergic contact dermatitis due to other agents: Secondary | ICD-10-CM | POA: Diagnosis not present

## 2017-05-31 ENCOUNTER — Ambulatory Visit (INDEPENDENT_AMBULATORY_CARE_PROVIDER_SITE_OTHER): Payer: Medicare HMO | Admitting: *Deleted

## 2017-05-31 DIAGNOSIS — I442 Atrioventricular block, complete: Secondary | ICD-10-CM

## 2017-05-31 NOTE — Progress Notes (Signed)
Remote pacemaker transmission.   

## 2017-06-01 ENCOUNTER — Encounter: Payer: Self-pay | Admitting: Cardiology

## 2017-06-01 LAB — CUP PACEART REMOTE DEVICE CHECK
Battery Remaining Longevity: 101 mo
Brady Statistic AP VP Percent: 30.3 %
Brady Statistic AP VS Percent: 0.03 %
Brady Statistic AS VP Percent: 65.12 %
Brady Statistic AS VS Percent: 4.54 %
Brady Statistic RA Percent Paced: 32.28 %
Date Time Interrogation Session: 20181007072035
Implantable Lead Implant Date: 20170907
Implantable Lead Location: 753859
Implantable Pulse Generator Implant Date: 20170907
Lead Channel Impedance Value: 285 Ohm
Lead Channel Impedance Value: 285 Ohm
Lead Channel Impedance Value: 304 Ohm
Lead Channel Impedance Value: 361 Ohm
Lead Channel Impedance Value: 380 Ohm
Lead Channel Impedance Value: 418 Ohm
Lead Channel Impedance Value: 570 Ohm
Lead Channel Impedance Value: 570 Ohm
Lead Channel Impedance Value: 722 Ohm
Lead Channel Pacing Threshold Amplitude: 0.75 V
Lead Channel Pacing Threshold Amplitude: 1 V
Lead Channel Pacing Threshold Pulse Width: 0.4 ms
Lead Channel Pacing Threshold Pulse Width: 0.4 ms
Lead Channel Pacing Threshold Pulse Width: 0.8 ms
Lead Channel Sensing Intrinsic Amplitude: 3 mV
Lead Channel Setting Pacing Amplitude: 2 V
Lead Channel Setting Pacing Amplitude: 2.5 V
Lead Channel Setting Pacing Pulse Width: 0.4 ms
Lead Channel Setting Pacing Pulse Width: 0.8 ms
Lead Channel Setting Sensing Sensitivity: 0.9 mV
MDC IDC LEAD IMPLANT DT: 20170907
MDC IDC LEAD IMPLANT DT: 20170907
MDC IDC LEAD LOCATION: 753858
MDC IDC LEAD LOCATION: 753860
MDC IDC MSMT BATTERY VOLTAGE: 3 V
MDC IDC MSMT LEADCHNL LV IMPEDANCE VALUE: 418 Ohm
MDC IDC MSMT LEADCHNL LV IMPEDANCE VALUE: 646 Ohm
MDC IDC MSMT LEADCHNL LV IMPEDANCE VALUE: 703 Ohm
MDC IDC MSMT LEADCHNL RA IMPEDANCE VALUE: 380 Ohm
MDC IDC MSMT LEADCHNL RA SENSING INTR AMPL: 3 mV
MDC IDC MSMT LEADCHNL RV IMPEDANCE VALUE: 418 Ohm
MDC IDC MSMT LEADCHNL RV PACING THRESHOLD AMPLITUDE: 1.25 V
MDC IDC MSMT LEADCHNL RV SENSING INTR AMPL: 10.125 mV
MDC IDC MSMT LEADCHNL RV SENSING INTR AMPL: 10.125 mV
MDC IDC SET LEADCHNL LV PACING AMPLITUDE: 2 V
MDC IDC STAT BRADY RV PERCENT PACED: 95.42 %

## 2017-06-01 NOTE — Progress Notes (Signed)
Letter  

## 2017-06-06 DIAGNOSIS — R69 Illness, unspecified: Secondary | ICD-10-CM | POA: Diagnosis not present

## 2017-06-13 ENCOUNTER — Ambulatory Visit (INDEPENDENT_AMBULATORY_CARE_PROVIDER_SITE_OTHER): Payer: Medicare HMO | Admitting: Orthopaedic Surgery

## 2017-06-13 ENCOUNTER — Encounter (INDEPENDENT_AMBULATORY_CARE_PROVIDER_SITE_OTHER): Payer: Self-pay | Admitting: Orthopaedic Surgery

## 2017-06-13 VITALS — BP 144/73 | HR 69 | Resp 14 | Ht 69.0 in | Wt 180.0 lb

## 2017-06-13 DIAGNOSIS — M1711 Unilateral primary osteoarthritis, right knee: Secondary | ICD-10-CM

## 2017-06-13 MED ORDER — LIDOCAINE HCL 1 % IJ SOLN
5.0000 mL | INTRAMUSCULAR | Status: AC | PRN
  Administered 2017-06-13: 5 mL

## 2017-06-13 MED ORDER — METHYLPREDNISOLONE ACETATE 40 MG/ML IJ SUSP
80.0000 mg | INTRAMUSCULAR | Status: AC | PRN
  Administered 2017-06-13: 80 mg

## 2017-06-13 MED ORDER — BUPIVACAINE HCL 0.5 % IJ SOLN
3.0000 mL | INTRAMUSCULAR | Status: AC | PRN
  Administered 2017-06-13: 3 mL via INTRA_ARTICULAR

## 2017-06-13 NOTE — Progress Notes (Signed)
Office Visit Note   Patient: [name]           Date of Birth: 05-06-1931           MRN: [id number] Visit Date: 06/13/2017              Requested by: [name], MD [address] [place], [place] [address] PCP: [name], MD   Assessment & Plan: Visit Diagnoses:  1. Unilateral primary osteoarthritis, right knee     Plan: Cortisone injection right knee. Long discussion regarding his arthritis and potential for knee replacement. He had a successful left total knee replacement years ago and is very happy.  Follow-Up Instructions: Return if symptoms worsen or fail to improve.   Orders:  No orders of the defined types were placed in this encounter.  No orders of the defined types were placed in this encounter.     Procedures: Large Joint Inj Date/Time: 06/13/2017 11:11 AM Performed by: Garald Balding Authorized by: Garald Balding   Consent Given by:  Patient Timeout: prior to procedure the correct patient, procedure, and site was verified   Indications:  Pain and joint swelling Location:  Knee Site:  R knee Prep: patient was prepped and draped in usual sterile fashion   Needle Size:  25 G Needle Length:  1.5 inches Approach:  Anteromedial Ultrasound Guidance: No   Fluoroscopic Guidance: No   Arthrogram: No   Medications:  5 mL lidocaine 1 %; 80 mg methylPREDNISolone acetate 40 MG/ML; 3 mL bupivacaine 0.5 % Aspiration Attempted: No   Patient tolerance:  Patient tolerated the procedure well with no immediate complications     Clinical Data: No additional findings.   Subjective: Chief Complaint  Patient presents with  . Right Knee - Pain    [name] is an 83 y o that presents with chronic right knee pain. He relates he is going on a trip to [facility]. And wants an injection.  Recurrent pain in right knee to the point where he "like to have a cortisone injection". He is planning his yearly trip to [place] would like to have some resolution of  the achiness and  soreness of his right knee. He's years status post left total knee replacement with an excellent result. He did want to discuss possible right knee replacement some time in the future.we've discussed the need for cardiac clearance with his history of heart valve replacement  HPI  Review of Systems   Objective: Vital Signs: BP (!) 144/73   Pulse 69   Resp 14   Ht _0  (1.753 m)   Wt 180 lb (81.6 kg)   BMI 26.58 kg/m   Physical Exam  Ortho Exam awake alert and oriented 3 comfortable sitting. Hard of hearing. She's not short of breath. No complaints of chest pain. Right knee with increased varus and pain predominantly along the medial compartment. Palpable osteophytes. Minimal effusion. Positive patellar crepitation. Lacks a few degrees to full extension. No pain laterally. No popliteal pain discomfort. Swelling distally. S1 pulses  Specialty Comments:  No specialty comments available.  Imaging: No results found.   PMFS History: Patient Active Problem List   Diagnosis Date Noted  . Severe Aortic Stenosis S/P TAVR (transcatheter aortic valve replacement) 05/24/2016  . Head trauma 04/25/2016  . Paresthesia 01/20/2016  . Dyspnea 11/17/2015  . CAD (coronary artery disease) of bypass graft 11/17/2015  . Abnormal cardiovascular stress test   . Spinal stenosis of lumbar region 07/22/2015  .  Abnormality of gait 07/22/2015  . Transient complete heart block ([place]) 05/14/2014  . Hypertensive cardiovascular disease 05/14/2014  . Bradycardia 12/23/2013  . Aftercare following surgery of the circulatory system, [place] 06/30/2013  . PVC's (premature ventricular contractions)   . Neuromuscular disorder ([place])   . Nephrolithiasis   . Hypothyroidism   . Ventricular tachycardia, non-sustained ([place])   . Osteoarthritis of knee 04/19/2012  . Hyponatremia 04/19/2012  . Hypokalemia 04/19/2012  . Postoperative anemia due to acute blood loss 04/19/2012  . Aortic stenosis   .  Occlusion and stenosis of carotid artery without mention of cerebral infarction 02/13/2012  . Carotid artery disease ([place])   . Hyperlipidemia   . HTN (hypertension)   . CAD (coronary artery disease)   . Syncope   . Psoriasis   . Ejection fraction   . Fluid overload   . Carpal tunnel syndrome   . Hx of CABG   . BENIGN PROSTATIC HYPERTROPHY, HX OF 06/11/2009   Past Medical History:  Diagnosis Date  . Arthritis   . Benign prostatic hypertrophy   . CAD (coronary artery disease)    DES, SVG to circumflex marginal, October, 2010 ( SVG to PDA and PLA patent , LIMA to LAD patent, EF 60%, mild inferior hypokinesis  . Complete heart block ([place])    a. s/p MDT CRTP 04/2016 Dr [name]  . GERD (gastroesophageal reflux disease)   . HTN (hypertension)   . Hyperlipidemia   . Hypothyroidism   . Nephrolithiasis   . Neuromuscular disorder ([place])   . Peripheral neuropathy   . Psoriasis    severe; with total skin exfoliation in the past resolved  . S/P TAVR (transcatheter aortic valve replacement)    severe AS >> s/p TAVR 9/17 // Limited Echo 10/17: EF 40-45%, lat and inf-lat HK, TAVR ok, MAC, mild BAE, PASP 31 mmHg, no effusion  . Ventricular tachycardia, non-sustained ([facility])     Family History  Problem Relation Age of Onset  . Heart attack Mother   . Heart disease Mother        Heart Disease before age 19  . Heart disease Father        Heart Disease before age 45  . Hypertension Father   . Hyperlipidemia Father   . Heart attack Son 24  . Colon cancer Neg Hx     Past Surgical History:  Procedure Laterality Date  . APPENDECTOMY    . bilateral L3-4 laminectomies    . CARDIAC CATHETERIZATION N/A 11/17/2015   Procedure: Right/Left Heart Cath and Coronary Angiography;  Surgeon: Peter M Martinique, MD;  Location: [facility];  Service: Cardiovascular;  Laterality: N/A;  . CARDIAC CATHETERIZATION N/A 11/17/2015   Procedure: Coronary Stent Intervention;  Surgeon: Peter M Martinique, MD;  Location:  [place];  Service: Cardiovascular;  Laterality: N/A;  . CARDIAC CATHETERIZATION N/A 04/19/2016   Procedure: Right/Left Heart Cath and Coronary/Graft Angiography;  Surgeon: [name], MD;  Location: [facility] [facility];  Service: Cardiovascular;  Laterality: N/A;  . CAROTID ENDARTERECTOMY Left 02-24-08   cea  Dr. Amedeo Plenty  . CARPAL TUNNEL RELEASE Bilateral 2010  . CATARACT EXTRACTION, BILATERAL    . COLONOSCOPY  11/23/2011   Procedure: COLONOSCOPY;  Surgeon: Rogene Houston, MD;  [facility];  Service: Endoscopy;  Laterality: N/A;  730  . CORONARY ANGIOPLASTY     2 stents  . CORONARY ARTERY BYPASS GRAFT  1995   x 3  . CORONARY STENT PLACEMENT  11/17/2015   SVG   DES to RCA  . decompression of left median nerve    . EP IMPLANTABLE DEVICE N/A 04/27/2016   MDT CRTP implanted by Dr [name]  . JOINT REPLACEMENT Left Sept. 27, 2013   Knee  . left carotid endarectomy and Dacron patch angioplasty]    . LEFT HEART CATHETERIZATION WITH CORONARY ANGIOGRAM N/A 12/27/2012   Procedure: LEFT HEART CATHETERIZATION WITH CORONARY ANGIOGRAM;  Surgeon: Burnell Blanks, MD;  Location: [facility] C[facility] LAB;  Service: Cardiovascular;  Laterality: N/A;  . posterior arthrodesis with autograft and allograft    . removal of synovial cyst    . TEE WITHOUT CARDIOVERSION N/A 04/21/2016   Procedure: TRANSESOPHAGEAL ECHOCARDIOGRAM (TEE);  Surgeon: Pixie Casino, MD;  Location: [facility] ENDOSCOPY;  Service: Cardiovascular;  Laterality: N/A;  . TEE WITHOUT CARDIOVERSION N/A 05/16/2016   Procedure: TRANSESOPHAGEAL ECHOCARDIOGRAM (TEE);  Surgeon: [name], MD;  Location: [facility];  Service: Open Heart Surgery;  Laterality: N/A;  . TONSILLECTOMY    . TOTAL KNEE ARTHROPLASTY  04/16/2012   Procedure: TOTAL KNEE ARTHROPLASTY;  Surgeon: Garald Balding, MD;  Location: [facility];  Service: Orthopedics;  Laterality: Left;  . TRANSCATHETER AORTIC VALVE REPLACEMENT, TRANSFEMORAL N/A 05/16/2016   Procedure: TRANSCATHETER  AORTIC VALVE REPLACEMENT, TRANSFEMORAL;  Surgeon: [name], MD;  Location: [place];  Service: Open Heart Surgery;  Laterality: N/A;   Social History   Occupational History  . Retired    Social History Main Topics  . Smoking status: Never Smoker  . Smokeless tobacco: Never Used  . Alcohol use No     Comment: One beer per week.  . Drug use: No  . Sexual activity: Not Currently     Garald Balding, MD   Note - This record has been created using [employer].  Chart creation errors have been sought, but may not always  have been located. Such creation errors do not reflect on  the standard of medical care.

## 2017-06-13 NOTE — Progress Notes (Deleted)
Office Visit Note   Patient: [name]           Date of Birth: [birth year]           MRN: [id number] Visit Date: 06/13/2017              Requested by: [name], MD [address] [place], [place] [address] PCP: [name], MD   Assessment & Plan: Visit Diagnoses:  1. Unilateral primary osteoarthritis, right knee     Plan: ***  Follow-Up Instructions: Return if symptoms worsen or fail to improve.   Orders:  No orders of the defined types were placed in this encounter.  No orders of the defined types were placed in this encounter.     Procedures: No procedures performed   Clinical Data: No additional findings.   Subjective: Chief Complaint  Patient presents with  . Right Knee - Pain    [name] is an 83 y o that presents with chronic right knee pain. He relates he is going on a trip to [facility]. And wants an injection.    HPI  Review of Systems  Constitutional: Negative for fatigue.  HENT: Negative for hearing loss.   Respiratory: Positive for shortness of breath. Negative for apnea and chest tightness.   Cardiovascular: Negative for chest pain, palpitations and leg swelling.  Gastrointestinal: Negative for blood in stool, constipation and diarrhea.  Genitourinary: Negative for difficulty urinating.  Musculoskeletal: Negative for arthralgias, back pain, joint swelling, myalgias, neck pain and neck stiffness.  Neurological: Negative for weakness, numbness and headaches.  Hematological: Does not bruise/bleed easily.  Psychiatric/Behavioral: Negative for sleep disturbance. The patient is not nervous/anxious.      Objective: Vital Signs: BP (!) 144/73   Pulse 69   Resp 14   Ht _0  (1.753 m)   Wt 180 lb (81.6 kg)   BMI 26.58 kg/m   Physical Exam  Ortho Exam  Specialty Comments:  No specialty comments available.  Imaging: No results found.   PMFS History: Patient Active Problem List   Diagnosis Date Noted  . Severe Aortic  Stenosis S/P TAVR (transcatheter aortic valve replacement) 05/24/2016  . Head trauma 04/25/2016  . Paresthesia 01/20/2016  . Dyspnea 11/17/2015  . CAD (coronary artery disease) of bypass graft 11/17/2015  . Abnormal cardiovascular stress test   . Spinal stenosis of lumbar region 07/22/2015  . Abnormality of gait 07/22/2015  . Transient complete heart block ([place]) 2020/09/2213  . Hypertensive cardiovascular disease 2020/09/2213  . Bradycardia 12/23/2013  . Aftercare following surgery of the circulatory system, [place] 06/30/2013  . PVC's (premature ventricular contractions)   . Neuromuscular disorder ([place])   . Nephrolithiasis   . Hypothyroidism   . Ventricular tachycardia, non-sustained ([place])   . Osteoarthritis of knee 04/19/2012  . Hyponatremia 04/19/2012  . Hypokalemia 04/19/2012  . Postoperative anemia due to acute blood loss 04/19/2012  . Aortic stenosis   . Occlusion and stenosis of carotid artery without mention of cerebral infarction 02/13/2012  . Carotid artery disease ([place])   . Hyperlipidemia   . HTN (hypertension)   . CAD (coronary artery disease)   . Syncope   . Psoriasis   . Ejection fraction   . Fluid overload   . Carpal tunnel syndrome   . Hx of CABG   . BENIGN PROSTATIC HYPERTROPHY, HX OF 06/11/2009   Past Medical History:  Diagnosis Date  . Arthritis   . Benign prostatic hypertrophy   . CAD (coronary artery disease)  DES, SVG to circumflex marginal, October, 2010 ( SVG to PDA and PLA patent , LIMA to LAD patent, EF 60%, mild inferior hypokinesis  . Complete heart block ([place])    a. s/p MDT CRTP 04/2016 Dr [name]  . GERD (gastroesophageal reflux disease)   . HTN (hypertension)   . Hyperlipidemia   . Hypothyroidism   . Nephrolithiasis   . Neuromuscular disorder ([place])   . Peripheral neuropathy   . Psoriasis    severe; with total skin exfoliation in the past resolved  . S/P TAVR (transcatheter aortic valve replacement)    severe AS >> s/p TAVR 9/17 // Limited  Echo 10/17: EF 40-45%, lat and inf-lat HK, TAVR ok, MAC, mild BAE, PASP 31 mmHg, no effusion  . Ventricular tachycardia, non-sustained (HCC)     Family History  Problem Relation Age of Onset  . Heart attack Mother   . Heart disease Mother        Heart Disease before age 60  . Heart disease Father        Heart Disease before age 40  . Hypertension Father   . Hyperlipidemia Father   . Heart attack Son 67  . Colon cancer Neg Hx     Past Surgical History:  Procedure Laterality Date  . APPENDECTOMY    . bilateral L3-4 laminectomies    . CARDIAC CATHETERIZATION N/A 11/17/2015   Procedure: Right/Left Heart Cath and Coronary Angiography;  Surgeon: Peter M Martinique, MD;  Location: [place] CV LAB;  Service: Cardiovascular;  Laterality: N/A;  . CARDIAC CATHETERIZATION N/A 11/17/2015   Procedure: Coronary Stent Intervention;  Surgeon: Peter M Martinique, MD;  Location: [place] CV LAB;  Service: Cardiovascular;  Laterality: N/A;  . CARDIAC CATHETERIZATION N/A 04/19/2016   Procedure: Right/Left Heart Cath and Coronary/Graft Angiography;  Surgeon: [name], MD;  Location: [place] CV LAB;  Service: Cardiovascular;  Laterality: N/A;  . CAROTID ENDARTERECTOMY Left 02-24-08   cea  Dr. Amedeo Plenty  . CARPAL TUNNEL RELEASE Bilateral 2010  . CATARACT EXTRACTION, BILATERAL    . COLONOSCOPY  11/23/2011   Procedure: COLONOSCOPY;  Surgeon: Rogene Houston, MD;  [facility];  Service: Endoscopy;  Laterality: N/A;  730  . CORONARY ANGIOPLASTY     2 stents  . CORONARY ARTERY BYPASS GRAFT  1995   x 3  . CORONARY STENT PLACEMENT  11/17/2015   SVG   DES to RCA  . decompression of left median nerve    . EP IMPLANTABLE DEVICE N/A 04/27/2016   MDT CRTP implanted by Dr [name]  . JOINT REPLACEMENT Left Sept. 27, 2013   Knee  . left carotid endarectomy and Dacron patch angioplasty]    . LEFT HEART CATHETERIZATION WITH CORONARY ANGIOGRAM N/A 12/27/2012   Procedure: LEFT HEART CATHETERIZATION WITH  CORONARY ANGIOGRAM;  Surgeon: Burnell Blanks, MD;  Location[facility];  Service: Cardiovascular;  Laterality: N/A;  . posterior arthrodesis with autograft and allograft    . removal of synovial cyst    . TEE WITHOUT CARDIOVERSION N/A 04/21/2016   Procedure: TRANSESOPHAGEAL ECHOCARDIOGRAM (TEE);  Surgeon: Pixie Casino, MD;  Location: [facility] ENDOSCOPY;  Service: Cardiovascular;  Laterality: N/A;  . TEE WITHOUT CARDIOVERSION N/A 05/16/2016   Procedure: TRANSESOPHAGEAL ECHOCARDIOGRAM (TEE);  Surgeon: [name], MD;  Location: [place];  Service: Open Heart Surgery;  Laterality: N/A;  . TONSILLECTOMY    . TOTAL KNEE ARTHROPLASTY  04/16/2012   Procedure: TOTAL KNEE ARTHROPLASTY;  Surgeon: Garald Balding,  MD;  Location: [place];  Service: Orthopedics;  Laterality: Left;  . TRANSCATHETER AORTIC VALVE REPLACEMENT, TRANSFEMORAL N/A 05/16/2016   Procedure: TRANSCATHETER AORTIC VALVE REPLACEMENT, TRANSFEMORAL;  Surgeon: [name], MD;  Location: [place];  Service: Open Heart Surgery;  Laterality: N/A;   Social History   Occupational History  . Retired    Social History Main Topics  . Smoking status: Never Smoker  . Smokeless tobacco: Never Used  . Alcohol use No     Comment: One beer per week.  . Drug use: No  . Sexual activity: Not Currently

## 2017-06-15 DIAGNOSIS — J40 Bronchitis, not specified as acute or chronic: Secondary | ICD-10-CM | POA: Diagnosis not present

## 2017-07-10 DIAGNOSIS — M109 Gout, unspecified: Secondary | ICD-10-CM | POA: Diagnosis not present

## 2017-07-10 DIAGNOSIS — D649 Anemia, unspecified: Secondary | ICD-10-CM | POA: Diagnosis not present

## 2017-07-10 DIAGNOSIS — I5022 Chronic systolic (congestive) heart failure: Secondary | ICD-10-CM | POA: Diagnosis not present

## 2017-07-10 DIAGNOSIS — Z79899 Other long term (current) drug therapy: Secondary | ICD-10-CM | POA: Diagnosis not present

## 2017-07-11 ENCOUNTER — Ambulatory Visit: Payer: Medicare HMO | Admitting: Internal Medicine

## 2017-07-11 ENCOUNTER — Encounter: Payer: Self-pay | Admitting: Internal Medicine

## 2017-07-11 VITALS — BP 116/68 | HR 77 | Ht 69.0 in | Wt 190.2 lb

## 2017-07-11 DIAGNOSIS — I441 Atrioventricular block, second degree: Secondary | ICD-10-CM

## 2017-07-11 DIAGNOSIS — I5042 Chronic combined systolic (congestive) and diastolic (congestive) heart failure: Secondary | ICD-10-CM

## 2017-07-11 LAB — CUP PACEART INCLINIC DEVICE CHECK
Brady Statistic AP VP Percent: 33.23 %
Brady Statistic AP VS Percent: 0.03 %
Brady Statistic RV Percent Paced: 94.78 %
Implantable Lead Implant Date: 20170907
Implantable Lead Implant Date: 20170907
Implantable Lead Implant Date: 20170907
Implantable Lead Location: 753858
Implantable Lead Model: 4598
Lead Channel Impedance Value: 361 Ohm
Lead Channel Impedance Value: 456 Ohm
Lead Channel Impedance Value: 665 Ohm
Lead Channel Impedance Value: 665 Ohm
Lead Channel Pacing Threshold Amplitude: 0.75 V
Lead Channel Pacing Threshold Pulse Width: 0.4 ms
Lead Channel Setting Pacing Amplitude: 2 V
Lead Channel Setting Pacing Pulse Width: 0.4 ms
Lead Channel Setting Pacing Pulse Width: 0.8 ms
MDC IDC LEAD LOCATION: 753859
MDC IDC LEAD LOCATION: 753860
MDC IDC MSMT BATTERY REMAINING LONGEVITY: 92 mo
MDC IDC MSMT BATTERY VOLTAGE: 3 V
MDC IDC MSMT LEADCHNL LV IMPEDANCE VALUE: 589 Ohm
MDC IDC MSMT LEADCHNL LV IMPEDANCE VALUE: 722 Ohm
MDC IDC MSMT LEADCHNL LV IMPEDANCE VALUE: 779 Ohm
MDC IDC MSMT LEADCHNL LV PACING THRESHOLD AMPLITUDE: 1.125 V
MDC IDC MSMT LEADCHNL LV PACING THRESHOLD PULSEWIDTH: 0.8 ms
MDC IDC MSMT LEADCHNL RA IMPEDANCE VALUE: 304 Ohm
MDC IDC MSMT LEADCHNL RA IMPEDANCE VALUE: 456 Ohm
MDC IDC MSMT LEADCHNL RA SENSING INTR AMPL: 3.125 mV
MDC IDC MSMT LEADCHNL RA SENSING INTR AMPL: 3.375 mV
MDC IDC MSMT LEADCHNL RV IMPEDANCE VALUE: 475 Ohm
MDC IDC MSMT LEADCHNL RV PACING THRESHOLD AMPLITUDE: 1.375 V
MDC IDC MSMT LEADCHNL RV PACING THRESHOLD PULSEWIDTH: 0.4 ms
MDC IDC MSMT LEADCHNL RV SENSING INTR AMPL: 12 mV
MDC IDC MSMT LEADCHNL RV SENSING INTR AMPL: 9 mV
MDC IDC PG IMPLANT DT: 20170907
MDC IDC SESS DTM: 20181121112711
MDC IDC SET LEADCHNL LV PACING AMPLITUDE: 2.25 V
MDC IDC SET LEADCHNL RV PACING AMPLITUDE: 2.75 V
MDC IDC SET LEADCHNL RV SENSING SENSITIVITY: 0.9 mV
MDC IDC STAT BRADY AS VP PERCENT: 61.56 %
MDC IDC STAT BRADY AS VS PERCENT: 5.18 %
MDC IDC STAT BRADY RA PERCENT PACED: 35.59 %

## 2017-07-11 MED ORDER — METOPROLOL SUCCINATE ER 50 MG PO TB24: ORAL_TABLET | ORAL | 3 refills | Status: DC

## 2017-07-11 NOTE — Patient Instructions (Addendum)
Medication Instructions:  Your physician has recommended you make the following change in your medication:   1.) INCREASE Metoprolol to 1 TABLET by mouth TWICE per day.  -- If you need a refill on your cardiac medications before your next appointment, please call your pharmacy. --  Labwork: None ordered  Testing/Procedures: None ordered  Follow-Up:  Remote monitoring is used to monitor your Pacemaker  from home. This monitoring reduces the number of office visits required to check your device to one time per year. It allows [place] to keep an eye on the functioning of your device to ensure it is working properly. You are scheduled for a device check from home on 08/30/2017. You may send your transmission at any time that day. If you have a wireless device, the transmission will be sent automatically. After your physician reviews your transmission, you will receive a postcard with your next transmission date.   Your physician wants you to follow-up in: 6 months with Chanetta Marshall NP.  You will receive a reminder letter in the mail two months in advance. If you don't receive a letter, please call our office to schedule the follow-up appointment.  Thank you for choosing [facility]!!   Frederik Schmidt, RN [phone]  Any Other Special Instructions Will Be Listed Below (If Applicable).

## 2017-07-11 NOTE — Progress Notes (Signed)
PCP: [name], MD Primary Cardiologist:  Dr Burt Knack Primary EP:  Dr Phillips Hay is a 83 y.o. male who presents today for routine electrophysiology followup.  Since last being seen in our clinic, the patient reports doing very well.  + occasional SOB.  Going to spend the winter months in [place] soon.  Today, he denies symptoms of palpitations, chest pain,   lower extremity edema, dizziness, presyncope, or syncope.  The patient is otherwise without complaint today.   Past Medical History:  Diagnosis Date  . Arthritis   . Benign prostatic hypertrophy   . CAD (coronary artery disease)    DES, SVG to circumflex marginal, October, 2010 ( SVG to PDA and PLA patent , LIMA to LAD patent, EF 60%, mild inferior hypokinesis  . Complete heart block ([place])    a. s/p MDT CRTP 04/2016 Dr [name]  . GERD (gastroesophageal reflux disease)   . HTN (hypertension)   . Hyperlipidemia   . Hypothyroidism   . Nephrolithiasis   . Neuromuscular disorder ([place])   . Peripheral neuropathy   . Psoriasis    severe; with total skin exfoliation in the past resolved  . S/P TAVR (transcatheter aortic valve replacement)    severe AS >> s/p TAVR 9/17 // Limited Echo 10/17: EF 40-45%, lat and inf-lat HK, TAVR ok, MAC, mild BAE, PASP 31 mmHg, no effusion  . Ventricular tachycardia, non-sustained [facility])    Past Surgical History:  Procedure Laterality Date  . APPENDECTOMY    . bilateral L3-4 laminectomies    . CARDIAC CATHETERIZATION N/A 11/17/2015   Procedure: Right/Left Heart Cath and Coronary Angiography;  Surgeon: Peter M Martinique, MD;  Location: [place] CV LAB;  Service: Cardiovascular;  Laterality: N/A;  . CARDIAC CATHETERIZATION N/A 11/17/2015   Procedure: Coronary Stent Intervention;  Surgeon: Peter M Martinique, MD;  Location: [place] CV LAB;  Service: Cardiovascular;  Laterality: N/A;  . CARDIAC CATHETERIZATION N/A 04/19/2016   Procedure: Right/Left Heart Cath and Coronary/Graft Angiography;   Surgeon: [name], MD;  Location: [place] CV LAB;  Service: Cardiovascular;  Laterality: N/A;  . CAROTID ENDARTERECTOMY Left 02-24-08   cea  Dr. Amedeo Plenty  . CARPAL TUNNEL RELEASE Bilateral 2010  . CATARACT EXTRACTION, BILATERAL    . COLONOSCOPY  11/23/2011   Procedure: COLONOSCOPY;  Surgeon: Rogene Houston, MD;  [facility];  Service: Endoscopy;  Laterality: N/A;  730  . CORONARY ANGIOPLASTY     2 stents  . CORONARY ARTERY BYPASS GRAFT  1995   x 3  . CORONARY STENT PLACEMENT  11/17/2015   SVG   DES to RCA  . decompression of left median nerve    . EP IMPLANTABLE DEVICE N/A 04/27/2016   MDT CRTP implanted by Dr [name]  . JOINT REPLACEMENT Left Sept. 27, 2013   Knee  . left carotid endarectomy and Dacron patch angioplasty]    . LEFT HEART CATHETERIZATION WITH CORONARY ANGIOGRAM N/A 12/27/2012   Procedure: LEFT HEART CATHETERIZATION WITH CORONARY ANGIOGRAM;  Surgeon: Burnell Blanks, MD;  Location: [facility] CATH LAB;  Service: Cardiovascular;  Laterality: N/A;  . posterior arthrodesis with autograft and allograft    . removal of synovial cyst    . TEE WITHOUT CARDIOVERSION N/A 04/21/2016   Procedure: TRANSESOPHAGEAL ECHOCARDIOGRAM (TEE);  Surgeon: Pixie Casino, MD;  Location: [facility] ENDOSCOPY;  Service: Cardiovascular;  Laterality: N/A;  . TEE WITHOUT CARDIOVERSION N/A 05/16/2016   Procedure: TRANSESOPHAGEAL ECHOCARDIOGRAM (TEE);  Surgeon: Legrand Como  Burt Knack, MD;  Location: [place];  Service: Open Heart Surgery;  Laterality: N/A;  . TONSILLECTOMY    . TOTAL KNEE ARTHROPLASTY  04/16/2012   Procedure: TOTAL KNEE ARTHROPLASTY;  Surgeon: Garald Balding, MD;  Location: [place];  Service: Orthopedics;  Laterality: Left;  . TRANSCATHETER AORTIC VALVE REPLACEMENT, TRANSFEMORAL N/A 05/16/2016   Procedure: TRANSCATHETER AORTIC VALVE REPLACEMENT, TRANSFEMORAL;  Surgeon: [name], MD;  Location: [place];  Service: Open Heart Surgery;  Laterality: N/A;    ROS- all systems are reviewed and  negative except as per HPI above  Current Outpatient Medications  Medication Sig Dispense Refill  . allopurinol (ZYLOPRIM) 300 MG tablet Take 600 mg by mouth daily.     Marland Kitchen amoxicillin (AMOXIL) 500 MG tablet Take 4 tablets (2,000 mg total) by mouth as needed. Take 4 of the 500 mg tablets = 2000 mg 60 minutes before dental procedure 4 tablet 2  . atorvastatin (LIPITOR) 20 MG tablet Take 20 mg by mouth every evening.     . clopidogrel (PLAVIX) 75 MG tablet Take 1 tablet (75 mg total) by mouth daily with breakfast. 90 tablet 3  . Docusate Sodium (COLACE PO) Take 2 tablets by mouth daily as needed (constipation).     . fluticasone (FLONASE) 50 MCG/ACT nasal spray Place 1 spray into both nostrils 2 (two) times daily.    . furosemide (LASIX) 40 MG tablet Take 1.5 tablets (60 mg total) by mouth daily. 90 tablet 3  . latanoprost (XALATAN) 0.005 % ophthalmic solution Place 1 drop into both eyes at bedtime.    Marland Kitchen levothyroxine (SYNTHROID, LEVOTHROID) 112 MCG tablet Take 112 mcg by mouth daily before breakfast.    . losartan (COZAAR) 100 MG tablet Take 100 mg by mouth daily.    . Magnesium 250 MG TABS Take 1 tablet by mouth 2 (two) times daily.    . Methylcellulose, Laxative, (CITRUCEL PO) Take by mouth as directed. Take 1 tablespoon at night    . metoprolol succinate (TOPROL-XL) 50 MG 24 hr tablet Take one-half tablet by mouth twice a day. Take with or immediately following a meal. 90 tablet 3  . multivitamin (THERAGRAN) per tablet Take 1 tablet by mouth daily.     . nitroGLYCERIN (NITROSTAT) 0.4 MG SL tablet Place 0.4 mg under the tongue every 5 (five) minutes x 3 doses as needed. For chest pain    . potassium chloride SA (K-DUR,KLOR-CON) 20 MEQ tablet Take 1.5 tablets (30 mEq total) by mouth daily. 90 tablet 3  . pyridOXINE (VITAMIN B-6) 100 MG tablet Take 100 mg by mouth daily.     . Tamsulosin HCl (FLOMAX) 0.4 MG CAPS Take 0.4 mg by mouth daily.      No current facility-administered medications for  this visit.     Physical Exam: Vitals:   07/11/17 0816  BP: 116/68  Pulse: 77  SpO2: 96%  Weight: 190 lb 3.2 oz (86.3 kg)  Height: 5' 9"  (1.753 m)    GEN- The patient is well appearing, alert and oriented x 3 today.   Head- normocephalic, atraumatic Eyes-  Sclera clear, conjunctiva pink Ears- hearing intact Oropharynx- clear Lungs- Clear to ausculation bilaterally, normal work of breathing Chest- pacemaker pocket is well healed Heart- Regular rate and rhythm, no murmurs, rubs or gallops, PMI not laterally displaced GI- soft, NT, ND, + BS Extremities- no clubbing, cyanosis, or edema  Pacemaker interrogation- reviewed in detail today,  See PACEART report   Assessment and Plan:  1. Symptomatic  second degree heart block Normal pacemaker function See Pace Art report No changes today  2. Severe AS/ CAD/ chronic systolic dysfunction Stable No change required today BiV pacing 94.4 % (95% last visit), previously reduced by PVCs  3.  NSVT Asymptomatic Increase Metoprolol to 45m twice daily  Will follow BP and decrease Losartan if needed  carelink Return to see EP NP in 6 months  JThompson GrayerMD, FCommunity Hospital Of Anaconda11/21/2018 8:30 AM

## 2017-07-16 ENCOUNTER — Telehealth: Payer: Self-pay | Admitting: Internal Medicine

## 2017-07-16 NOTE — Telephone Encounter (Signed)
[name] called and said that due to fatigue and weakness, he could not tolerate metoprolol 50mg  BID.  He requests reduction. He will try metoprolol 25mg  qam and 50mg  qpm.  If he does not tolerate this, then he will reduce further to metoprolol 25mg  BID.  If he still feels bad, he will call us back.  [name] MD, [facility] 07/16/2017 3:45 PM

## 2017-07-17 DIAGNOSIS — R69 Illness, unspecified: Secondary | ICD-10-CM | POA: Diagnosis not present

## 2017-07-17 DIAGNOSIS — H401131 Primary open-angle glaucoma, bilateral, mild stage: Secondary | ICD-10-CM | POA: Diagnosis not present

## 2017-07-18 DIAGNOSIS — I5022 Chronic systolic (congestive) heart failure: Secondary | ICD-10-CM | POA: Diagnosis not present

## 2017-07-18 NOTE — Addendum Note (Signed)
Addended by: Lianne Cure A on: 07/18/2017 11:20 AM   Modules accepted: Orders

## 2017-07-19 DIAGNOSIS — H903 Sensorineural hearing loss, bilateral: Secondary | ICD-10-CM | POA: Diagnosis not present

## 2017-07-19 DIAGNOSIS — H6123 Impacted cerumen, bilateral: Secondary | ICD-10-CM | POA: Diagnosis not present

## 2017-08-07 ENCOUNTER — Encounter: Payer: Self-pay | Admitting: Nurse Practitioner

## 2017-08-07 ENCOUNTER — Encounter: Payer: Self-pay | Admitting: *Deleted

## 2017-08-07 ENCOUNTER — Ambulatory Visit: Payer: Medicare HMO | Admitting: Nurse Practitioner

## 2017-08-07 VITALS — Ht 68.0 in | Wt 188.0 lb

## 2017-08-07 DIAGNOSIS — I472 Ventricular tachycardia: Secondary | ICD-10-CM

## 2017-08-07 DIAGNOSIS — I441 Atrioventricular block, second degree: Secondary | ICD-10-CM

## 2017-08-07 DIAGNOSIS — I4729 Other ventricular tachycardia: Secondary | ICD-10-CM

## 2017-08-07 DIAGNOSIS — I25810 Atherosclerosis of coronary artery bypass graft(s) without angina pectoris: Secondary | ICD-10-CM

## 2017-08-07 DIAGNOSIS — I5022 Chronic systolic (congestive) heart failure: Secondary | ICD-10-CM | POA: Diagnosis not present

## 2017-08-07 DIAGNOSIS — R0602 Shortness of breath: Secondary | ICD-10-CM | POA: Diagnosis not present

## 2017-08-07 DIAGNOSIS — Z952 Presence of prosthetic heart valve: Secondary | ICD-10-CM

## 2017-08-07 NOTE — Progress Notes (Signed)
  PCP: Fagan, Roy, MD Primary Cardiologist:  Dr Cooper Primary EP:  Dr Allred  [name] is a 83 y.o. male who presents today for routine electrophysiology followup.  He called yesterday to be added on to clinic with concerns for worsening shortness of breath.  He has been in [place] the last few weeks. Over the last 2-3 days, he developed shortness of breath with exertion. He has not had chest pain. He has also had orthopnea. He drove back from [place] yesterday and requested to be seen in the office for further evaluation.  Today, he denies symptoms of palpitations, chest pain, lower extremity edema, dizziness, presyncope, or syncope.  The patient is otherwise without complaint today.   Past Medical History:  Diagnosis Date  . Arthritis   . Benign prostatic hypertrophy   . CAD (coronary artery disease)    DES, SVG to circumflex marginal, October, 2010 ( SVG to PDA and PLA patent , LIMA to LAD patent, EF 60%, mild inferior hypokinesis  . Complete heart block (HCC)    a. s/p MDT CRTP 04/2016 Dr Allred  . GERD (gastroesophageal reflux disease)   . HTN (hypertension)   . Hyperlipidemia   . Hypothyroidism   . Nephrolithiasis   . Neuromuscular disorder (HCC)   . Peripheral neuropathy   . Psoriasis    severe; with total skin exfoliation in the past resolved  . S/P TAVR (transcatheter aortic valve replacement)    severe AS >> s/p TAVR 9/17 // Limited Echo 10/17: EF 40-45%, lat and inf-lat HK, TAVR ok, MAC, mild BAE, PASP 31 mmHg, no effusion  . Ventricular tachycardia, non-sustained (HCC)    Past Surgical History:  Procedure Laterality Date  . APPENDECTOMY    . bilateral L3-4 laminectomies    . CARDIAC CATHETERIZATION N/A 11/17/2015   Procedure: Right/Left Heart Cath and Coronary Angiography;  Surgeon: Peter M Jordan, MD;  [facility] IN[facility] LAB;  Service: Cardiovascular;  Laterality: N/A;  . CARDIAC CATHETERIZATION N/A 11/17/2015   Procedure: Coronary Stent Intervention;   Surgeon: Peter M Jordan, MD;  [facility] LAB;  Service: Cardiovascular;  Laterality: N/A;  . CARDIAC CATHETERIZATION N/A 04/19/2016   Procedure: Right/Left Heart Cath and Coronary/Graft Angiography;  Surgeon: Michael Cooper, MD;  [facility];  Service: Cardiovascular;  Laterality: N/A;  . CAROTID ENDARTERECTOMY Left 02-24-08   cea  Dr. Hayes  . CARPAL TUNNEL RELEASE Bilateral 2010  . CATARACT EXTRACTION, BILATERAL    . COLONOSCOPY  11/23/2011   Procedure: COLONOSCOPY;  Surgeon: Najeeb U Rehman, MD;  [facility];  Service: Endoscopy;  Laterality: N/A;  730  . CORONARY ANGIOPLASTY     2 stents  . CORONARY ARTERY BYPASS GRAFT  1995   x 3  . CORONARY STENT PLACEMENT  11/17/2015   SVG   DES to RCA  . decompression of left median nerve    . EP IMPLANTABLE DEVICE N/A 04/27/2016   MDT CRTP implanted by Dr Allred  . JOINT REPLACEMENT Left Sept. 27, 2013   Knee  . left carotid endarectomy and Dacron patch angioplasty]    . LEFT HEART CATHETERIZATION WITH CORONARY ANGIOGRAM N/A 12/27/2012   Procedure: LEFT HEART CATHETERIZATION WITH CORONARY ANGIOGRAM;  Surgeon: Christopher D McAlhany, MD;  [facility] CATH [facility];  Service: Cardiovascular;  Laterality: N/A;  . posterior arthrodesis with autograft and allograft    . removal of synovial cyst    . TEE WITHOUT CARDIOVERSION N/A 04/21/2016     Procedure: TRANSESOPHAGEAL ECHOCARDIOGRAM (TEE);  Surgeon: Kenneth C Hilty, MD;  [facility] ENDOS[facility];  Service: Cardiovascular;  Laterality: N/A;  . TEE WITHOUT CARDIOVERSION N/A 05/16/2016   Procedure: TRANSESOPHAGEAL ECHOCARDIOGRAM (TEE);  Surgeon: Michael Cooper, MD;  [facility];  Service: Open Heart Surgery;  Laterality: N/A;  . TONSILLECTOMY    . TOTAL KNEE ARTHROPLASTY  04/16/2012   Procedure: TOTAL KNEE ARTHROPLASTY;  Surgeon: Peter W Whitfield, MD;  [facility];  Service: Orthopedics;  Laterality: Left;  . TRANSCATHETER AORTIC VALVE REPLACEMENT, TRANSFEMORAL N/A  05/16/2016   Procedure: TRANSCATHETER AORTIC VALVE REPLACEMENT, TRANSFEMORAL;  Surgeon: Michael Cooper, MD;  [facility];  Service: Open Heart Surgery;  Laterality: N/A;    ROS- all systems are reviewed and negative except as per HPI above  Current Outpatient Medications  Medication Sig Dispense Refill  . allopurinol (ZYLOPRIM) 300 MG tablet Take 600 mg by mouth daily.     . amoxicillin (AMOXIL) 500 MG tablet Take 4 tablets (2,000 mg total) by mouth as needed. Take 4 of the 500 mg tablets = 2000 mg 60 minutes before dental procedure 4 tablet 2  . atorvastatin (LIPITOR) 20 MG tablet Take 20 mg by mouth every evening.     . clopidogrel (PLAVIX) 75 MG tablet Take 1 tablet (75 mg total) by mouth daily with breakfast. 90 tablet 3  . Docusate Sodium (COLACE PO) Take 2 tablets by mouth daily as needed (constipation).     . fluticasone (FLONASE) 50 MCG/ACT nasal spray Place 1 spray into both nostrils 2 (two) times daily.    . latanoprost (XALATAN) 0.005 % ophthalmic solution Place 1 drop into both eyes at bedtime.    . levothyroxine (SYNTHROID, LEVOTHROID) 112 MCG tablet Take 112 mcg by mouth daily before breakfast.    . losartan (COZAAR) 100 MG tablet Take 100 mg by mouth daily.    . Magnesium 250 MG TABS Take 1 tablet by mouth 2 (two) times daily.    . Methylcellulose, Laxative, (CITRUCEL PO) Take by mouth as directed. Take 1 tablespoon at night    . metoprolol succinate (TOPROL-XL) 50 MG 24 hr tablet Take one- tablet by mouth twice a day. Take with or immediately following a meal. 90 tablet 3  . metoprolol succinate (TOPROL-XL) 50 MG 24 hr tablet Take 50 mg by mouth daily. PT TAKES HALF A TABLET IN THE MORNING AND A WHOLE TABLET AT NIGHT    . multivitamin (THERAGRAN) per tablet Take 1 tablet by mouth daily.     . nitroGLYCERIN (NITROSTAT) 0.4 MG SL tablet Place 0.4 mg under the tongue every 5 (five) minutes x 3 doses as needed. For chest pain    . potassium chloride SA (K-DUR,KLOR-CON) 20 MEQ  tablet Take 1.5 tablets (30 mEq total) by mouth daily. 90 tablet 3  . pyridOXINE (VITAMIN B-6) 100 MG tablet Take 100 mg by mouth daily.     . Tamsulosin HCl (FLOMAX) 0.4 MG CAPS Take 0.4 mg by mouth daily.     . furosemide (LASIX) 40 MG tablet Take 1.5 tablets (60 mg total) by mouth daily. 90 tablet 3   No current facility-administered medications for this visit.     Physical Exam: Vitals:   08/07/17 1250  Weight: 188 lb (85.3 kg)  Height: 5' 8" (1.727 m)    GEN- The patient is elderly appearing, alert and oriented x 3 today.   Head- normocephalic, atraumatic Eyes-  Sclera clear, conjunctiva pink Ears- hearing intact Oropharynx- clear Lungs- Clear to   ausculation bilaterally, normal work of breathing Chest- pacemaker pocket is well healed Heart- Regular rate and rhythm, no murmurs, rubs or gallops, PMI not laterally displaced GI- soft, NT, ND, + BS Extremities- no clubbing, cyanosis, or edema  Pacemaker interrogation- reviewed in detail today,  See PACEART report   Assessment and Plan:  1. Symptomatic second degree heart block Normal pacemaker function See Pace Art report No changes today  2. Severe AS/ CAD/ chronic systolic dysfunction See below  3.  NSVT No significant recurrence since increasing Metoprolol  4.  Shortness of breath Concern this is a manifestation of ischemia Discussed with Dr Allred and Dr Cooper today. Will plan R/LHC on Thursday to evaluate further. Risks, benefits reviewed with patient who wishes to proceed.  Will need to be cautious with RHC with LV lead in place. ER precautions reviewed. Labs today  Plan: cath Thursday with Dr Cooper  Aneshia Jacquet, NP 08/07/2017 3:25 PM     

## 2017-08-07 NOTE — H&P (View-Only) (Signed)
PCP: [name], MD Primary Cardiologist:  Dr Burt Knack Primary EP:  Dr Phillips [name] is a 83 y.o. [name] who presents today for routine electrophysiology followup.  He called yesterday to be added on to clinic with concerns for worsening shortness of breath.  He has been in [place] the last few weeks. Over the last 2-3 days, he developed shortness of breath with exertion. He has not had chest pain. He has also had orthopnea. He drove back from [facility] yesterday and requested to be seen in the office for further evaluation.  Today, he denies symptoms of palpitations, chest pain, lower extremity edema, dizziness, presyncope, or syncope.  The patient is otherwise without complaint today.   Past Medical History:  Diagnosis Date  . Arthritis   . Benign prostatic hypertrophy   . CAD (coronary artery disease)    DES, SVG to circumflex marginal, October, 2010 ( SVG to PDA and PLA patent , LIMA to LAD patent, EF 60%, mild inferior hypokinesis  . Complete heart block ([place])    a. s/p MDT CRTP 04/2016 Dr Daniel [name]  . GERD (gastroesophageal reflux disease)   . HTN (hypertension)   . Hyperlipidemia   . Hypothyroidism   . Nephrolithiasis   . Neuromuscular disorder ([place])   . Peripheral neuropathy   . Psoriasis    severe; with total skin exfoliation in the past resolved  . S/P TAVR (transcatheter aortic valve replacement)    severe AS >> s/p TAVR 9/17 // Limited Echo 10/17: EF 40-45%, lat and inf-lat HK, TAVR ok, MAC, mild BAE, PASP 31 mmHg, no effusion  . Ventricular tachycardia, non-sustained [facility])    Past Surgical History:  Procedure Laterality Date  . APPENDECTOMY    . bilateral L3-4 laminectomies    . CARDIAC CATHETERIZATION N/A 11/17/2015   Procedure: Right/Left Heart Cath and Coronary Angiography;  Surgeon: Daniel M Martinique, MD;  Location: [place] CV LAB;  Service: Cardiovascular;  Laterality: N/A;  . CARDIAC CATHETERIZATION N/A 11/17/2015   Procedure: Coronary Stent Intervention;   Surgeon: Daniel M Martinique, MD;  Location: [place] [facility];  Service: Cardiovascular;  Laterality: N/A;  . CARDIAC CATHETERIZATION N/A 04/19/2016   Procedure: Right/Left Heart Cath and Coronary/Graft Angiography;  Surgeon: Daniel Mocha, MD;  Location: [place] [facility];  Service: Cardiovascular;  Laterality: N/A;  . CAROTID ENDARTERECTOMY Left 02-24-08   cea  Dr. Amedeo Plenty  . CARPAL TUNNEL RELEASE Bilateral 2010  . CATARACT EXTRACTION, BILATERAL    . COLONOSCOPY  11/23/2011   Procedure: COLONOSCOPY;  Surgeon: Rogene Houston, MD;  [facility];  Service: Endoscopy;  Laterality: N/A;  730  . CORONARY ANGIOPLASTY     2 stents  . CORONARY ARTERY BYPASS GRAFT  1995   x 3  . CORONARY STENT PLACEMENT  11/17/2015   SVG   DES to RCA  . decompression of left median nerve    . EP IMPLANTABLE DEVICE N/A 04/27/2016   MDT CRTP implanted by Dr Daniel [name]  . JOINT REPLACEMENT Left Sept. 27, 2013   Knee  . left carotid endarectomy and Dacron patch angioplasty]    . LEFT HEART CATHETERIZATION WITH CORONARY ANGIOGRAM N/A 12/27/2012   Procedure: LEFT HEART CATHETERIZATION WITH CORONARY ANGIOGRAM;  Surgeon: Burnell Blanks, MD;  Location: [facility] CATH LAB;  Service: Cardiovascular;  Laterality: N/A;  . posterior arthrodesis with autograft and allograft    . removal of synovial cyst    . TEE WITHOUT CARDIOVERSION N/A 04/21/2016  Procedure: TRANSESOPHAGEAL ECHOCARDIOGRAM (TEE);  Surgeon: Daniel Casino, MD;  Location: [facility] ENDOSCOPY;  Service: Cardiovascular;  Laterality: N/A;  . TEE WITHOUT CARDIOVERSION N/A 05/16/2016   Procedure: TRANSESOPHAGEAL ECHOCARDIOGRAM (TEE);  Surgeon: Daniel Mocha, MD;  Location: [place];  Service: Open Heart Surgery;  Laterality: N/A;  . TONSILLECTOMY    . TOTAL KNEE ARTHROPLASTY  04/16/2012   Procedure: TOTAL KNEE ARTHROPLASTY;  Surgeon: Daniel Balding, MD;  Location: [place];  Service: Orthopedics;  Laterality: Left;  . TRANSCATHETER AORTIC VALVE REPLACEMENT, TRANSFEMORAL N/A  05/16/2016   Procedure: TRANSCATHETER AORTIC VALVE REPLACEMENT, TRANSFEMORAL;  Surgeon: Daniel Mocha, MD;  Location: [place];  Service: Open Heart Surgery;  Laterality: N/A;    ROS- all systems are reviewed and negative except as per HPI above  Current Outpatient Medications  Medication Sig Dispense Refill  . allopurinol (ZYLOPRIM) 300 MG tablet Take 600 mg by mouth daily.     Marland Kitchen amoxicillin (AMOXIL) 500 MG tablet Take 4 tablets (2,000 mg total) by mouth as needed. Take 4 of the 500 mg tablets = 2000 mg 60 minutes before dental procedure 4 tablet 2  . atorvastatin (LIPITOR) 20 MG tablet Take 20 mg by mouth every evening.     . clopidogrel (PLAVIX) 75 MG tablet Take 1 tablet (75 mg total) by mouth daily with breakfast. 90 tablet 3  . Docusate Sodium (COLACE PO) Take 2 tablets by mouth daily as needed (constipation).     . fluticasone (FLONASE) 50 MCG/ACT nasal spray Place 1 spray into both nostrils 2 (two) times daily.    Marland Kitchen latanoprost (XALATAN) 0.005 % ophthalmic solution Place 1 drop into both eyes at bedtime.    Marland Kitchen levothyroxine (SYNTHROID, LEVOTHROID) 112 MCG tablet Take 112 mcg by mouth daily before breakfast.    . losartan (COZAAR) 100 MG tablet Take 100 mg by mouth daily.    . Magnesium 250 MG TABS Take 1 tablet by mouth 2 (two) times daily.    . Methylcellulose, Laxative, (CITRUCEL PO) Take by mouth as directed. Take 1 tablespoon at night    . metoprolol succinate (TOPROL-XL) 50 MG 24 hr tablet Take one- tablet by mouth twice a day. Take with or immediately following a meal. 90 tablet 3  . metoprolol succinate (TOPROL-XL) 50 MG 24 hr tablet Take 50 mg by mouth daily. PT TAKES HALF A TABLET IN THE MORNING AND A WHOLE TABLET AT NIGHT    . multivitamin (THERAGRAN) per tablet Take 1 tablet by mouth daily.     . nitroGLYCERIN (NITROSTAT) 0.4 MG SL tablet Place 0.4 mg under the tongue every 5 (five) minutes x 3 doses as needed. For chest pain    . potassium chloride SA (K-DUR,KLOR-CON) 20 MEQ  tablet Take 1.5 tablets (30 mEq total) by mouth daily. 90 tablet 3  . pyridOXINE (VITAMIN B-6) 100 MG tablet Take 100 mg by mouth daily.     . Tamsulosin HCl (FLOMAX) 0.4 MG CAPS Take 0.4 mg by mouth daily.     . furosemide (LASIX) 40 MG tablet Take 1.5 tablets (60 mg total) by mouth daily. 90 tablet 3   No current facility-administered medications for this visit.     Physical Exam: Vitals:   08/07/17 1250  Weight: 188 lb (Daniel.3 kg)  Height: _0  (1.727 m)    GEN- The patient is elderly appearing, alert and oriented x 3 today.   Head- normocephalic, atraumatic Eyes-  Sclera clear, conjunctiva pink Ears- hearing intact Oropharynx- clear Lungs- Clear to  ausculation bilaterally, normal work of breathing Chest- pacemaker pocket is well healed Heart- Regular rate and rhythm, no murmurs, rubs or gallops, PMI not laterally displaced GI- soft, NT, ND, + BS Extremities- no clubbing, cyanosis, or edema  Pacemaker interrogation- reviewed in detail today,  See PACEART report   Assessment and Plan:  1. Symptomatic second degree heart block Normal pacemaker function See Pace Art report No changes today  2. Severe AS/ CAD/ chronic systolic dysfunction See below  3.  NSVT No significant recurrence since increasing Metoprolol  4.  Shortness of breath Concern this is a manifestation of ischemia Discussed with Dr Daniel [name] and Dr Burt Knack today. Will plan R/LHC on Thursday to evaluate further. Risks, benefits reviewed with patient who wishes to proceed.  Will need to be cautious with RHC with LV lead in place. ER precautions reviewed. Labs today  Plan: cath Thursday with Dr Reita Chard, NP 08/07/2017 3:25 PM

## 2017-08-07 NOTE — Patient Instructions (Addendum)
Medication Instructions:   Your physician recommends that you continue on your current medications as directed. Please refer to the Current Medication list given to you today.   If you need a refill on your cardiac medications before your next appointment, please call your pharmacy.  Labwork: BMET  AND CBC  TODAY    Testing/Procedures: SEE LETTER FOR LEFT HEART CATH  WITH DR Burt Knack  08-09-17   Follow-Up:   BASED UPON RESULTS   Any Other Special Instructions Will Be Listed Below (If Applicable).

## 2017-08-08 ENCOUNTER — Telehealth: Payer: Self-pay

## 2017-08-08 LAB — BASIC METABOLIC PANEL
BUN / CREAT RATIO: 20 (ref 10–24)
BUN: 19 mg/dL (ref 8–27)
CHLORIDE: 95 mmol/L — AB (ref 96–106)
CO2: 26 mmol/L (ref 20–29)
Calcium: 9.4 mg/dL (ref 8.6–10.2)
Creatinine, Ser: 0.95 mg/dL (ref 0.76–1.27)
GFR calc Af Amer: 85 mL/min/{1.73_m2} (ref >59)
GFR calc non Af Amer: 73 mL/min/{1.73_m2} (ref >59)
GLUCOSE: 107 mg/dL — AB (ref 65–99)
Potassium: 4.4 mmol/L (ref 3.5–5.2)
SODIUM: 135 mmol/L (ref 134–144)

## 2017-08-08 LAB — CBC
HEMATOCRIT: 31.4 % — AB (ref 37.5–51.0)
Hemoglobin: 10.3 g/dL — ABNORMAL LOW (ref 13.0–17.7)
MCH: 31.8 pg (ref 26.6–33.0)
MCHC: 32.8 g/dL (ref 31.5–35.7)
MCV: 97 fL (ref 79–97)
PLATELETS: 145 10*3/uL — AB (ref 150–379)
RBC: 3.24 x10E6/uL — AB (ref 4.14–5.80)
RDW: 15.7 % — AB (ref 12.3–15.4)
WBC: 7 10*3/uL (ref 3.4–10.8)

## 2017-08-08 LAB — PRO B NATRIURETIC PEPTIDE: NT-Pro BNP: 2488 pg/mL — ABNORMAL HIGH (ref 0–486)

## 2017-08-08 NOTE — Telephone Encounter (Signed)
Left detailed message per DPR:  Patient contacted pre-catheterization at [facility] scheduled for:  08/09/2017 @ 1500 Verified arrival time and place:  NT @ 1300 Medication instruction: Take Plavix Hold lasix Patient must have responsible person to drive home post procedure and observe patient for 24 hours Addl concerns:  none

## 2017-08-09 ENCOUNTER — Other Ambulatory Visit: Payer: Self-pay

## 2017-08-09 ENCOUNTER — Encounter (HOSPITAL_COMMUNITY): Payer: Self-pay | Admitting: General Practice

## 2017-08-09 ENCOUNTER — Ambulatory Visit (HOSPITAL_COMMUNITY)
Admission: RE | Admit: 2017-08-09 | Discharge: 2017-08-10 | Disposition: A | Discharge status: Home / Self Care | Payer: Medicare HMO | Source: Ambulatory Visit | Attending: Cardiovascular Disease | Admitting: Cardiovascular Disease

## 2017-08-09 ENCOUNTER — Encounter (HOSPITAL_COMMUNITY)
Admission: RE | Disposition: A | Discharge status: Home / Self Care | Payer: Self-pay | Source: Ambulatory Visit | Attending: Cardiovascular Disease

## 2017-08-09 DIAGNOSIS — Z955 Presence of coronary angioplasty implant and graft: Secondary | ICD-10-CM

## 2017-08-09 DIAGNOSIS — I25810 Atherosclerosis of coronary artery bypass graft(s) without angina pectoris: Secondary | ICD-10-CM | POA: Diagnosis present

## 2017-08-09 DIAGNOSIS — Z888 Allergy status to other drugs, medicaments and biological substances status: Secondary | ICD-10-CM | POA: Insufficient documentation

## 2017-08-09 DIAGNOSIS — G629 Polyneuropathy, unspecified: Secondary | ICD-10-CM | POA: Diagnosis not present

## 2017-08-09 DIAGNOSIS — Z952 Presence of prosthetic heart valve: Secondary | ICD-10-CM | POA: Diagnosis not present

## 2017-08-09 DIAGNOSIS — I472 Ventricular tachycardia: Secondary | ICD-10-CM | POA: Diagnosis not present

## 2017-08-09 DIAGNOSIS — Z79899 Other long term (current) drug therapy: Secondary | ICD-10-CM | POA: Diagnosis not present

## 2017-08-09 DIAGNOSIS — N400 Enlarged prostate without lower urinary tract symptoms: Secondary | ICD-10-CM | POA: Insufficient documentation

## 2017-08-09 DIAGNOSIS — I110 Hypertensive heart disease with heart failure: Secondary | ICD-10-CM | POA: Diagnosis not present

## 2017-08-09 DIAGNOSIS — E039 Hypothyroidism, unspecified: Secondary | ICD-10-CM | POA: Insufficient documentation

## 2017-08-09 DIAGNOSIS — Z7902 Long term (current) use of antithrombotics/antiplatelets: Secondary | ICD-10-CM | POA: Insufficient documentation

## 2017-08-09 DIAGNOSIS — I2582 Chronic total occlusion of coronary artery: Secondary | ICD-10-CM | POA: Diagnosis not present

## 2017-08-09 DIAGNOSIS — Z951 Presence of aortocoronary bypass graft: Secondary | ICD-10-CM | POA: Diagnosis not present

## 2017-08-09 DIAGNOSIS — I10 Essential (primary) hypertension: Secondary | ICD-10-CM | POA: Insufficient documentation

## 2017-08-09 DIAGNOSIS — I5043 Acute on chronic combined systolic (congestive) and diastolic (congestive) heart failure: Secondary | ICD-10-CM | POA: Diagnosis not present

## 2017-08-09 DIAGNOSIS — E785 Hyperlipidemia, unspecified: Secondary | ICD-10-CM | POA: Insufficient documentation

## 2017-08-09 DIAGNOSIS — I441 Atrioventricular block, second degree: Secondary | ICD-10-CM | POA: Insufficient documentation

## 2017-08-09 DIAGNOSIS — K219 Gastro-esophageal reflux disease without esophagitis: Secondary | ICD-10-CM | POA: Diagnosis not present

## 2017-08-09 DIAGNOSIS — R0602 Shortness of breath: Secondary | ICD-10-CM

## 2017-08-09 DIAGNOSIS — I5042 Chronic combined systolic (congestive) and diastolic (congestive) heart failure: Secondary | ICD-10-CM | POA: Diagnosis present

## 2017-08-09 HISTORY — PX: CORONARY ANGIOPLASTY WITH STENT PLACEMENT: SHX49

## 2017-08-09 HISTORY — PX: CORONARY STENT INTERVENTION: CATH118234

## 2017-08-09 HISTORY — PX: RIGHT/LEFT HEART CATH AND CORONARY/GRAFT ANGIOGRAPHY: CATH118267

## 2017-08-09 LAB — POCT I-STAT 3, VENOUS BLOOD GAS (G3P V)
Acid-Base Excess: 1 mmol/L (ref 0.0–2.0)
BICARBONATE: 24.3 mmol/L (ref 20.0–28.0)
O2 SAT: 67 %
PCO2 VEN: 32.2 mmHg — AB (ref 44.0–60.0)
TCO2: 25 mmol/L (ref 22–32)
pH, Ven: 7.486 — ABNORMAL HIGH (ref 7.250–7.430)
pO2, Ven: 32 mmHg (ref 32.0–45.0)

## 2017-08-09 LAB — POCT I-STAT 3, ART BLOOD GAS (G3+)
ACID-BASE EXCESS: 3 mmol/L — AB (ref 0.0–2.0)
BICARBONATE: 25 mmol/L (ref 20.0–28.0)
O2 SAT: 100 %
TCO2: 26 mmol/L (ref 22–32)
pCO2 arterial: 28.1 mmHg — ABNORMAL LOW (ref 32.0–48.0)
pH, Arterial: 7.557 — ABNORMAL HIGH (ref 7.350–7.450)
pO2, Arterial: 169 mmHg — ABNORMAL HIGH (ref 83.0–108.0)

## 2017-08-09 LAB — PROTIME-INR
INR: 1.15
PROTHROMBIN TIME: 14.7 s (ref 11.4–15.2)

## 2017-08-09 LAB — POCT ACTIVATED CLOTTING TIME: Activated Clotting Time: 373 seconds

## 2017-08-09 SURGERY — RIGHT/LEFT HEART CATH AND CORONARY/GRAFT ANGIOGRAPHY
Anesthesia: LOCAL

## 2017-08-09 MED ORDER — ADULT MULTIVITAMIN W/MINERALS CH
1.0000 | ORAL_TABLET | Freq: Every day | ORAL | Status: DC
  Administered 2017-08-09 – 2017-08-10 (×2): 1 via ORAL
  Filled 2017-08-09 (×2): qty 1

## 2017-08-09 MED ORDER — HEPARIN (PORCINE) IN NACL 2-0.9 UNIT/ML-% IJ SOLN
INTRAMUSCULAR | Status: AC
  Filled 2017-08-09: qty 1000

## 2017-08-09 MED ORDER — MIDAZOLAM HCL 2 MG/2ML IJ SOLN
INTRAMUSCULAR | Status: DC | PRN
  Administered 2017-08-09 (×2): 1 mg via INTRAVENOUS

## 2017-08-09 MED ORDER — ACETAMINOPHEN 325 MG PO TABS: 650.0000 mg | ORAL_TABLET | ORAL | Status: DC | PRN

## 2017-08-09 MED ORDER — METOPROLOL SUCCINATE ER 25 MG PO TB24
25.0000 mg | ORAL_TABLET | Freq: Every day | ORAL | Status: DC
Start: 2017-08-10 — End: 2017-08-10
  Administered 2017-08-10: 11:00:00 25 mg via ORAL
  Filled 2017-08-09: qty 1

## 2017-08-09 MED ORDER — ASPIRIN 81 MG PO CHEW
CHEWABLE_TABLET | ORAL | Status: AC
  Filled 2017-08-09: qty 4

## 2017-08-09 MED ORDER — ANGIOPLASTY BOOK
Freq: Once | Status: AC
  Administered 2017-08-10: 04:00:00
  Filled 2017-08-09: qty 1

## 2017-08-09 MED ORDER — LABETALOL HCL 5 MG/ML IV SOLN: 10.0000 mg | INTRAVENOUS | Status: AC | PRN

## 2017-08-09 MED ORDER — MIDAZOLAM HCL 2 MG/2ML IJ SOLN
INTRAMUSCULAR | Status: AC
  Filled 2017-08-09: qty 2

## 2017-08-09 MED ORDER — DOCUSATE SODIUM 100 MG PO CAPS: 100.0000 mg | ORAL_CAPSULE | Freq: Every day | ORAL | Status: DC | PRN

## 2017-08-09 MED ORDER — CLOPIDOGREL BISULFATE 75 MG PO TABS
75.0000 mg | ORAL_TABLET | Freq: Every day | ORAL | Status: DC
  Administered 2017-08-10: 75 mg via ORAL
  Filled 2017-08-09: qty 1

## 2017-08-09 MED ORDER — FENTANYL CITRATE (PF) 100 MCG/2ML IJ SOLN
INTRAMUSCULAR | Status: AC
  Filled 2017-08-09: qty 2

## 2017-08-09 MED ORDER — IOPAMIDOL (ISOVUE-370) INJECTION 76%
INTRAVENOUS | Status: AC
  Filled 2017-08-09: qty 100

## 2017-08-09 MED ORDER — LIDOCAINE HCL (PF) 1 % IJ SOLN
INTRAMUSCULAR | Status: AC
  Filled 2017-08-09: qty 30

## 2017-08-09 MED ORDER — ONDANSETRON HCL 4 MG/2ML IJ SOLN: 4.0000 mg | Freq: Four times a day (QID) | INTRAMUSCULAR | Status: DC | PRN

## 2017-08-09 MED ORDER — SODIUM CHLORIDE 0.9 % WEIGHT BASED INFUSION
3.0000 mL/kg/h | INTRAVENOUS | Status: AC
  Administered 2017-08-09: 3 mL/kg/h via INTRAVENOUS

## 2017-08-09 MED ORDER — POTASSIUM CHLORIDE CRYS ER 20 MEQ PO TBCR
30.0000 meq | EXTENDED_RELEASE_TABLET | Freq: Every day | ORAL | Status: DC
  Administered 2017-08-09 – 2017-08-10 (×2): 30 meq via ORAL
  Filled 2017-08-09 (×2): qty 1

## 2017-08-09 MED ORDER — VERAPAMIL HCL 2.5 MG/ML IV SOLN
INTRAVENOUS | Status: AC
  Filled 2017-08-09: qty 2

## 2017-08-09 MED ORDER — ALLOPURINOL 300 MG PO TABS
600.0000 mg | ORAL_TABLET | Freq: Every day | ORAL | Status: DC
  Administered 2017-08-10: 10:00:00 600 mg via ORAL
  Filled 2017-08-09: qty 2

## 2017-08-09 MED ORDER — ATORVASTATIN CALCIUM 20 MG PO TABS
20.0000 mg | ORAL_TABLET | Freq: Every evening | ORAL | Status: DC
  Administered 2017-08-09: 20:00:00 20 mg via ORAL
  Filled 2017-08-09: qty 1

## 2017-08-09 MED ORDER — LEVOTHYROXINE SODIUM 112 MCG PO TABS
112.0000 ug | ORAL_TABLET | Freq: Every day | ORAL | Status: DC
  Administered 2017-08-10: 112 ug via ORAL
  Filled 2017-08-09: qty 1

## 2017-08-09 MED ORDER — SODIUM CHLORIDE 0.9 % IV SOLN: INTRAVENOUS | Status: DC

## 2017-08-09 MED ORDER — TAMSULOSIN HCL 0.4 MG PO CAPS
0.4000 mg | ORAL_CAPSULE | Freq: Every day | ORAL | Status: DC
  Administered 2017-08-10: 0.4 mg via ORAL
  Filled 2017-08-09: qty 1

## 2017-08-09 MED ORDER — VITAMIN B-6 100 MG PO TABS
100.0000 mg | ORAL_TABLET | Freq: Every day | ORAL | Status: DC
  Administered 2017-08-09 – 2017-08-10 (×2): 100 mg via ORAL
  Filled 2017-08-09 (×2): qty 1

## 2017-08-09 MED ORDER — IOPAMIDOL (ISOVUE-370) INJECTION 76%
INTRAVENOUS | Status: AC
  Filled 2017-08-09: qty 50

## 2017-08-09 MED ORDER — SODIUM CHLORIDE 0.9% FLUSH: 3.0000 mL | INTRAVENOUS | Status: DC | PRN

## 2017-08-09 MED ORDER — LATANOPROST 0.005 % OP SOLN
1.0000 [drp] | Freq: Every day | OPHTHALMIC | Status: DC
  Administered 2017-08-09: 23:00:00 1 [drp] via OPHTHALMIC
  Filled 2017-08-09 (×2): qty 2.5

## 2017-08-09 MED ORDER — BIVALIRUDIN TRIFLUOROACETATE 250 MG IV SOLR
INTRAVENOUS | Status: AC
  Filled 2017-08-09: qty 250

## 2017-08-09 MED ORDER — FUROSEMIDE 40 MG PO TABS
60.0000 mg | ORAL_TABLET | Freq: Every day | ORAL | Status: DC
  Administered 2017-08-10: 60 mg via ORAL
  Filled 2017-08-09: qty 1

## 2017-08-09 MED ORDER — VERAPAMIL HCL 2.5 MG/ML IV SOLN
INTRAVENOUS | Status: DC | PRN
  Administered 2017-08-09: 100 ug via INTRACORONARY
  Administered 2017-08-09 (×2): 200 ug via INTRACORONARY

## 2017-08-09 MED ORDER — MAGNESIUM OXIDE 400 (241.3 MG) MG PO TABS
200.0000 mg | ORAL_TABLET | Freq: Every day | ORAL | Status: DC
  Administered 2017-08-09 – 2017-08-10 (×2): 200 mg via ORAL
  Filled 2017-08-09 (×2): qty 1

## 2017-08-09 MED ORDER — LIDOCAINE HCL (PF) 1 % IJ SOLN
INTRAMUSCULAR | Status: DC | PRN
  Administered 2017-08-09: 15 mL

## 2017-08-09 MED ORDER — SODIUM CHLORIDE 0.9% FLUSH
3.0000 mL | Freq: Two times a day (BID) | INTRAVENOUS | Status: DC
  Administered 2017-08-09: 3 mL via INTRAVENOUS

## 2017-08-09 MED ORDER — MAGNESIUM 250 MG PO TABS
1.0000 | ORAL_TABLET | Freq: Two times a day (BID) | ORAL | Status: DC
Start: 2017-08-09 — End: 2017-08-09

## 2017-08-09 MED ORDER — HEPARIN (PORCINE) IN NACL 2-0.9 UNIT/ML-% IJ SOLN
INTRAMUSCULAR | Status: AC | PRN
  Administered 2017-08-09: 1000 mL

## 2017-08-09 MED ORDER — BIVALIRUDIN BOLUS VIA INFUSION - CUPID
INTRAVENOUS | Status: DC | PRN
  Administered 2017-08-09: 63.975 mg via INTRAVENOUS

## 2017-08-09 MED ORDER — ASPIRIN 81 MG PO CHEW
CHEWABLE_TABLET | ORAL | Status: DC | PRN
  Administered 2017-08-09: 324 mg via ORAL

## 2017-08-09 MED ORDER — SODIUM CHLORIDE 0.9 % IV SOLN
INTRAVENOUS | Status: AC | PRN
  Administered 2017-08-09: 1.75 mg/kg/h via INTRAVENOUS

## 2017-08-09 MED ORDER — LOSARTAN POTASSIUM 50 MG PO TABS
100.0000 mg | ORAL_TABLET | Freq: Every day | ORAL | Status: DC
  Administered 2017-08-10: 10:00:00 100 mg via ORAL
  Filled 2017-08-09: qty 2

## 2017-08-09 MED ORDER — LIDOCAINE-EPINEPHRINE 1 %-1:100000 IJ SOLN
INTRAMUSCULAR | Status: AC
  Filled 2017-08-09: qty 1

## 2017-08-09 MED ORDER — MULTIVITAMINS PO TABS: 1.0000 | ORAL_TABLET | Freq: Every day | ORAL | Status: DC

## 2017-08-09 MED ORDER — SODIUM CHLORIDE 0.9 % WEIGHT BASED INFUSION: 1.0000 mL/kg/h | INTRAVENOUS | Status: DC

## 2017-08-09 MED ORDER — SODIUM CHLORIDE 0.9 % IV SOLN: 250.0000 mL | INTRAVENOUS | Status: DC | PRN

## 2017-08-09 MED ORDER — ASPIRIN 81 MG PO CHEW
81.0000 mg | CHEWABLE_TABLET | Freq: Every day | ORAL | Status: DC
  Administered 2017-08-10: 81 mg via ORAL
  Filled 2017-08-09: qty 1

## 2017-08-09 MED ORDER — METOPROLOL SUCCINATE ER 50 MG PO TB24
50.0000 mg | ORAL_TABLET | Freq: Every day | ORAL | Status: DC
  Administered 2017-08-09: 50 mg via ORAL
  Filled 2017-08-09: qty 1

## 2017-08-09 MED ORDER — FUROSEMIDE 10 MG/ML IJ SOLN
80.0000 mg | Freq: Once | INTRAMUSCULAR | Status: AC
Start: 2017-08-09 — End: 2017-08-09
  Administered 2017-08-09: 20:00:00 80 mg via INTRAVENOUS
  Filled 2017-08-09: qty 8

## 2017-08-09 MED ORDER — LIDOCAINE-EPINEPHRINE 1 %-1:100000 IJ SOLN
INTRAMUSCULAR | Status: DC | PRN
  Administered 2017-08-09: 10 mL

## 2017-08-09 MED ORDER — FLUTICASONE PROPIONATE 50 MCG/ACT NA SUSP
1.0000 | Freq: Two times a day (BID) | NASAL | Status: DC
  Administered 2017-08-09 – 2017-08-10 (×2): 1 via NASAL
  Filled 2017-08-09: qty 16

## 2017-08-09 MED ORDER — IOPAMIDOL (ISOVUE-370) INJECTION 76%
INTRAVENOUS | Status: DC | PRN
  Administered 2017-08-09: 100 mL via INTRA_ARTERIAL

## 2017-08-09 MED ORDER — FENTANYL CITRATE (PF) 100 MCG/2ML IJ SOLN
INTRAMUSCULAR | Status: DC | PRN
  Administered 2017-08-09: 25 ug via INTRAVENOUS

## 2017-08-09 SURGICAL SUPPLY — 24 items
BALLN SAPPHIRE 2.5X15 (BALLOONS) ×2
BALLN SAPPHIRE ~~LOC~~ 3.5X8 (BALLOONS) ×2 IMPLANT
BALLN SAPPHIRE ~~LOC~~ 4.0X8 (BALLOONS) ×2 IMPLANT
BALLN ~~LOC~~ EUPHORA RX 3.25X8 (BALLOONS) ×2
BALLOON SAPPHIRE 2.5X15 (BALLOONS) ×1 IMPLANT
BALLOON ~~LOC~~ EUPHORA RX 3.25X8 (BALLOONS) ×1 IMPLANT
CATH INFINITI 5FR MULTPACK ANG (CATHETERS) ×2 IMPLANT
CATH SWAN GANZ 7F STRAIGHT (CATHETERS) ×2 IMPLANT
CATH VISTA GUIDE 6FR RCB 90 CM (CATHETERS) ×2 IMPLANT
DEVICE CLOSURE PERCLS PRGLD 6F (VASCULAR PRODUCTS) ×1 IMPLANT
KIT ENCORE 26 ADVANTAGE (KITS) ×2 IMPLANT
KIT HEART LEFT (KITS) ×2 IMPLANT
PACK CARDIAC CATHETERIZATION (CUSTOM PROCEDURE TRAY) ×2 IMPLANT
PERCLOSE PROGLIDE 6F (VASCULAR PRODUCTS) ×2
SHEATH PINNACLE 5F 10CM (SHEATH) ×2 IMPLANT
SHEATH PINNACLE 6F 10CM (SHEATH) ×2 IMPLANT
SHEATH PINNACLE 7F 10CM (SHEATH) ×2 IMPLANT
STENT RESOLUTE ONYX 3.0X22 (Permanent Stent) ×2 IMPLANT
STENT RESOLUTE ONYX 4.0X12 (Permanent Stent) ×2 IMPLANT
SYR MEDRAD MARK V 150ML (SYRINGE) ×2 IMPLANT
TRANSDUCER W/STOPCOCK (MISCELLANEOUS) ×2 IMPLANT
TUBING CIL FLEX 10 FLL-RA (TUBING) ×4 IMPLANT
WIRE COUGAR XT STRL 190CM (WIRE) ×2 IMPLANT
WIRE EMERALD 3MM-J .035X150CM (WIRE) ×2 IMPLANT

## 2017-08-09 NOTE — Interval H&P Note (Signed)
Cath Lab Visit (complete for each Cath Lab visit)  Clinical Evaluation Leading to the Procedure:   ACS: No.  Non-ACS:    Anginal Classification: CCS III  Anti-ischemic medical therapy: Minimal Therapy (1 class of medications)  Non-Invasive Test Results: No non-invasive testing performed  Prior CABG: Previous CABG      History and Physical Interval Note:  08/09/2017 4:19 PM  [name]  has presented today for surgery, with the diagnosis of shortness of breath  The various methods of treatment have been discussed with the patient and family. After consideration of risks, benefits and other options for treatment, the patient has consented to  Procedure(s): RIGHT/LEFT HEART CATH AND CORONARY ANGIOGRAPHY (N/A) as a surgical intervention .  The patient's history has been reviewed, patient examined, no change in status, stable for surgery.  I have reviewed the patient's chart and labs.  Questions were answered to the patient's satisfaction.     [name]

## 2017-08-09 NOTE — Progress Notes (Signed)
Site area: right groin (VENOUS SHEATH)  Site Prior to Removal:  Level 0  Pressure Applied For 10 MINUTES    Minutes Beginning at 2000  Manual:   Yes.    Patient Status During Pull:  AAO X 4  Post Pull Groin Site:  Level 0  Post Pull Instructions Given:  Yes.    Post Pull Pulses Present:  Yes.    Dressing Applied:  Yes.    Comments:  TOLERATED PROCEDURE WELL

## 2017-08-10 ENCOUNTER — Encounter (HOSPITAL_COMMUNITY): Payer: Self-pay | Admitting: Cardiovascular Disease

## 2017-08-10 DIAGNOSIS — I5043 Acute on chronic combined systolic (congestive) and diastolic (congestive) heart failure: Secondary | ICD-10-CM | POA: Diagnosis not present

## 2017-08-10 DIAGNOSIS — I472 Ventricular tachycardia: Secondary | ICD-10-CM

## 2017-08-10 DIAGNOSIS — K219 Gastro-esophageal reflux disease without esophagitis: Secondary | ICD-10-CM | POA: Diagnosis not present

## 2017-08-10 DIAGNOSIS — E785 Hyperlipidemia, unspecified: Secondary | ICD-10-CM | POA: Diagnosis not present

## 2017-08-10 DIAGNOSIS — Z955 Presence of coronary angioplasty implant and graft: Secondary | ICD-10-CM

## 2017-08-10 DIAGNOSIS — R0602 Shortness of breath: Secondary | ICD-10-CM

## 2017-08-10 DIAGNOSIS — I25810 Atherosclerosis of coronary artery bypass graft(s) without angina pectoris: Secondary | ICD-10-CM | POA: Diagnosis not present

## 2017-08-10 DIAGNOSIS — Z951 Presence of aortocoronary bypass graft: Secondary | ICD-10-CM | POA: Diagnosis not present

## 2017-08-10 DIAGNOSIS — I2582 Chronic total occlusion of coronary artery: Secondary | ICD-10-CM | POA: Diagnosis not present

## 2017-08-10 DIAGNOSIS — I441 Atrioventricular block, second degree: Secondary | ICD-10-CM

## 2017-08-10 DIAGNOSIS — I25708 Atherosclerosis of coronary artery bypass graft(s), unspecified, with other forms of angina pectoris: Secondary | ICD-10-CM

## 2017-08-10 DIAGNOSIS — I110 Hypertensive heart disease with heart failure: Secondary | ICD-10-CM | POA: Diagnosis not present

## 2017-08-10 DIAGNOSIS — Z888 Allergy status to other drugs, medicaments and biological substances status: Secondary | ICD-10-CM | POA: Diagnosis not present

## 2017-08-10 DIAGNOSIS — N400 Enlarged prostate without lower urinary tract symptoms: Secondary | ICD-10-CM | POA: Diagnosis not present

## 2017-08-10 DIAGNOSIS — Z952 Presence of prosthetic heart valve: Secondary | ICD-10-CM | POA: Diagnosis not present

## 2017-08-10 LAB — BASIC METABOLIC PANEL
Anion gap: 8 (ref 5–15)
BUN: 18 mg/dL (ref 6–20)
CALCIUM: 9.3 mg/dL (ref 8.9–10.3)
CO2: 29 mmol/L (ref 22–32)
CREATININE: 1.08 mg/dL (ref 0.61–1.24)
Chloride: 98 mmol/L — ABNORMAL LOW (ref 101–111)
GFR calc Af Amer: >60 mL/min (ref >60)
Glucose, Bld: 100 mg/dL — ABNORMAL HIGH (ref 65–99)
POTASSIUM: 3.5 mmol/L (ref 3.5–5.1)
SODIUM: 135 mmol/L (ref 135–145)

## 2017-08-10 LAB — CBC
HCT: 28.9 % — ABNORMAL LOW (ref 39.0–52.0)
HEMOGLOBIN: 9.9 g/dL — AB (ref 13.0–17.0)
MCH: 33.1 pg (ref 26.0–34.0)
MCHC: 34.3 g/dL (ref 30.0–36.0)
MCV: 96.7 fL (ref 78.0–100.0)
Platelets: 137 10*3/uL — ABNORMAL LOW (ref 150–400)
RBC: 2.99 MIL/uL — AB (ref 4.22–5.81)
RDW: 15.5 % (ref 11.5–15.5)
WBC: 6 10*3/uL (ref 4.0–10.5)

## 2017-08-10 MED ORDER — ASPIRIN 81 MG PO CHEW: 81.0000 mg | CHEWABLE_TABLET | Freq: Every day | ORAL | Status: DC

## 2017-08-10 MED ORDER — METOPROLOL SUCCINATE ER 50 MG PO TB24: 50.0000 mg | ORAL_TABLET | Freq: Every day | ORAL | 0 refills | Status: DC

## 2017-08-10 NOTE — Progress Notes (Signed)
Electrophysiology Rounding Note  Patient Name: [name] Date of Encounter: 08/10/2017  Primary Cardiologist: Burt Knack Electrophysiologist: Toula Miyasaki   Subjective   The patient is doing well today.  At this time, the patient denies chest pain, shortness of breath, or any new concerns. He feels significantly improved post intervention and diuresis.   Inpatient Medications    Scheduled Meds: . allopurinol  600 mg Oral Daily  . aspirin  81 mg Oral Daily  . atorvastatin  20 mg Oral QPM  . clopidogrel  75 mg Oral Q breakfast  . fluticasone  1 spray Each Nare BID  . furosemide  60 mg Oral Daily  . latanoprost  1 drop Both Eyes QHS  . levothyroxine  112 mcg Oral QAC breakfast  . losartan  100 mg Oral Daily  . magnesium oxide  200 mg Oral Daily  . metoprolol succinate  25 mg Oral Daily  . metoprolol succinate  50 mg Oral QHS  . multivitamin with minerals  1 tablet Oral Daily  . potassium chloride SA  30 mEq Oral Daily  . pyridOXINE  100 mg Oral Daily  . sodium chloride flush  3 mL Intravenous Q12H  . tamsulosin  0.4 mg Oral Daily   Continuous Infusions: . sodium chloride     PRN Meds: sodium chloride, acetaminophen, docusate sodium, ondansetron (ZOFRAN) IV, sodium chloride flush   Vital Signs    Vitals:   08/09/17 1749 08/09/17 1915 08/09/17 2200 08/10/17 0425  BP: (!) 146/73 (!) 131/48 (!) 110/46 (!) 121/48  Pulse: 72 64 (!) 59 61  Resp: 16 (!) 21 18 16   Temp:  (!) 97.5 F (36.4 C)  98.3 F (36.8 C)  TempSrc:  Oral  Oral  SpO2: 100% 98% 98% 93%  Weight:    189 lb 9.5 oz (86 kg)  Height:        Intake/Output Summary (Last 24 hours) at 08/10/2017 0729 Last data filed at 08/10/2017 0424 Gross per 24 hour  Intake 240 ml  Output 2300 ml  Net -2060 ml   Filed Weights   08/09/17 1252 08/10/17 0425  Weight: 188 lb (85.3 kg) 189 lb 9.5 oz (86 kg)    Physical Exam    GEN- The patient is elderly appearing, alert and oriented x 3 today.   Head- normocephalic,  atraumatic Eyes-  Sclera clear, conjunctiva pink Ears- hearing intact Oropharynx- clear Neck- supple Lungs- Clear to ausculation bilaterally, normal work of breathing Heart- Regular rate and rhythm (paced) GI- soft, NT, ND, + BS Extremities- no clubbing, cyanosis, or edema Skin- no rash or lesion Psych- euthymic mood, full affect Neuro- strength and sensation are intact  Labs    CBC Recent Labs    08/07/17 1326 08/10/17 0405  WBC 7.0 6.0  HGB 10.3* 9.9*  HCT 31.4* 28.9*  MCV 97 96.7  PLT 145* 106*   Basic Metabolic Panel Recent Labs    08/07/17 1326 08/10/17 0405  NA 135 135  K 4.4 3.5  CL 95* 98*  CO2 26 29  GLUCOSE 107* 100*  BUN 19 18  CREATININE 0.95 1.08  CALCIUM 9.4 9.3    Telemetry    AV pacing, rare PVC's (personally reviewed)  Radiology    No results found.   Patient Profile     [name] is a 83 y.o. male with progressive shortness of breath on exertion, planned for elective cath 08/09/17. He underwent PCI and IV diuresis.   Assessment & Plan  1.  CAD s/p PCI 08/10/17 Significantly symptomatically improved Per Dr Burt Knack, continue Plavix and add ASA 81mg  daily  2.  Mobitz II Normal CRTP device interrogation in office recently EKG unchanged from prior to [place]  3. NSVT Will follow through device on increased Metoprolol dose [place] was improved on device interrogation this week in office  4.  Acute on chronic systolic heart failure Improved post diuresis yesterday Home Lasix dose is 60mg  daily - would continue for now I will see in office in 2 weeks  Discharge plan per primary team.  Dr [name] also to see later today    Signed, Chanetta Marshall, NP  08/10/2017, 7:29 AM   I have seen, examined the patient, and reviewed the above assessment and plan.  Changes to above are made where necessary.  On exam, RRR.  Dong very well s/p PCI.  Will follow closely after discharge.  Co Sign: [name], MD 08/10/2017

## 2017-08-10 NOTE — Progress Notes (Signed)
CARDIAC REHAB PHASE I   PRE:  Rate/Rhythm: 75 SR  BP:  Supine:   Sitting: 111/47  Standing:    SaO2:   MODE:  Ambulation: 350 ft   POST:  Rate/Rhythm: 80 SR  BP:  Supine:   Sitting: 136/57  Standing:    SaO2: 95%RA 0825-0915 Pt walked 350 ft on RA with fairly steady gait. Tolerated well and stated he is less SOB. Gave pt CHF booklet and discussed when to call MD with weight gain and symptoms. Gave low sodium diets and discussed 2000 mg sodium restriction. Reviewed NTG use, importance of plavix with stent, ex ed and CRP 2. Will refer to [facility] program. Pt may be going to [place] this winter so he may not be able to attend. He has attended  before.  Does not want referral to [place] if he decides to go for winter. Will ex on his own then.   Graylon Good, RN BSN  08/10/2017 9:12 AM

## 2017-08-10 NOTE — Discharge Summary (Signed)
Discharge Summary    Patient ID: THELMA VIANA,  MRN: [id number], DOB/AGE: 81-Jun-1932 81 y.o.  Admit date: 08/09/2017 Discharge date: 08/10/2017  Primary Care Provider: [name] Primary Cardiologist: Cooper/Allred  Discharge Diagnoses    Active Problems:   Acute on chronic combined systolic and diastolic CHF (congestive heart failure) (HCC)   CAD (coronary artery disease) of bypass graft   Acute on chronic systolic and diastolic heart failure, NYHA class 3 ([place])   Status post coronary artery stent placement   Allergies Allergies  Allergen Reactions  . Other Other (See Comments)    Severe rash from gel used during ECHO & carotid doppler  . Propylene Glycol Hives and Rash    Diagnostic Studies/Procedures    Cath: 08/09/17  Conclusion   1.  Severe native vessel coronary artery disease with total occlusion of the proximal RCA and total occlusion of the left 2.  Status post aortocoronary bypass surgery with continued patency of the LIMA to LAD and saphenous vein graft to OM 3.  Severe stenosis in the ostium of the saphenous vein graft to PDA as well as the distal body of the graft, both lesions treated successfully with PCI using drug-eluting stents (3.0 x 22 mm resolute Onyx in the distal body of the graft and 4.0 x 12 mm resolute Onyx in the proximal/ostial graft). 4.  Moderate segmental LV systolic dysfunction with LVEF estimated at 40-45% 5.  Status post TAVR with no significant aortic valve gradient on pullback 6.  Right heart catheterization demonstrating elevated pulmonary capillary wedge pressure with large V waves suggestive of mitral regurgitation or more likely marked diastolic dysfunction  Recommend: Continue clopidogrel without interruption, add aspirin 81 mg daily, diurese overnight with a single dose of IV Lasix and then continue oral furosemide.  If no complications arise should be okay for discharge tomorrow.  _____________   History of Present  Illness     83 y.o. male who presented to the office on 08/07/17 for routine electrophysiology follow up with Chanetta Marshall.  He called in and asked to be added on to clinic with concerns for worsening shortness of breath.  He had been in [place] the last few weeks. Over the last 2-3 days prior to office visit, he developed shortness of breath with exertion. He had not had chest pain. He has also had orthopnea. He drove back from [facility] the day prior to office visit and requested to be seen in the office for further evaluation.  At office visit, he denied symptoms of palpitations, chest pain, lower extremity edema, dizziness, presyncope, or syncope. His symptoms were discussed with Dr. Merdis Delay and he was set up for outpatient cardiac cath.   Hospital Course     Underwent cath noted above with patent LIMA-LAD, SVG-OM, but stenosis in the ostium and distal body of the SVG-PDA. Underwent successful PCI/DES x2 to these lesions. Plan for DAPT with ASA/Plavix. RHC showed elevated pulmonary pressures, and was given a dose of IV lasix. No significant aortic valve dysfunction noted. Good UOP overnight, with stable labs the following morning. Worked well with cardiac rehab, no chest pain and breathing significantly improved.   General: Well developed, well nourished, male appearing in no acute distress. Head: Normocephalic, atraumatic.  Neck: Supple without bruits, JVD. Lungs:  Resp regular and unlabored, CTA. Heart: RRR, S1, S2, no S3, S4, or murmur; no rub. Abdomen: Soft, non-tender, non-distended with normoactive bowel sounds. No hepatomegaly. No rebound/guarding. No obvious abdominal masses. Extremities: No  clubbing, cyanosis, edema. Distal pedal pulses are 2+ bilaterally. R groin cath site stable without bruising or hematoma Neuro: Alert and oriented X 3. Moves all extremities spontaneously. Psych: Normal affect.  NOAL ABSHIER was seen by Dr. Martinique and determined stable for discharge home. Follow  up in the office has been arranged. Medications are listed below.   _____________  Discharge Vitals Blood pressure (!) 111/47, pulse 60, temperature 98 F (36.7 C), temperature source Oral, resp. rate 18, height 5\' 8"  (1.727 m), weight 189 lb 9.5 oz (86 kg), SpO2 96 %.  Filed Weights   08/09/17 1252 08/10/17 0425  Weight: 188 lb (85.3 kg) 189 lb 9.5 oz (86 kg)    Labs & Radiologic Studies    CBC Recent Labs    08/07/17 1326 08/10/17 0405  WBC 7.0 6.0  HGB 10.3* 9.9*  HCT 31.4* 28.9*  MCV 97 96.7  PLT 145* 443*   Basic Metabolic Panel Recent Labs    08/07/17 1326 08/10/17 0405  NA 135 135  K 4.4 3.5  CL 95* 98*  CO2 26 29  GLUCOSE 107* 100*  BUN 19 18  CREATININE 0.95 1.08  CALCIUM 9.4 9.3   Liver Function Tests No results for input(s): AST, ALT, ALKPHOS, BILITOT, PROT, ALBUMIN in the last 72 hours. No results for input(s): LIPASE, AMYLASE in the last 72 hours. Cardiac Enzymes No results for input(s): CKTOTAL, CKMB, CKMBINDEX, TROPONINI in the last 72 hours. BNP Invalid input(s): POCBNP D-Dimer No results for input(s): DDIMER in the last 72 hours. Hemoglobin A1C No results for input(s): HGBA1C in the last 72 hours. Fasting Lipid Panel No results for input(s): CHOL, HDL, LDLCALC, TRIG, CHOLHDL, LDLDIRECT in the last 72 hours. Thyroid Function Tests No results for input(s): TSH, T4TOTAL, T3FREE, THYROIDAB in the last 72 hours.  Invalid input(s): FREET3 _____________  No results found. Disposition   Pt is being discharged home today in good condition.  Follow-up Plans & Appointments    Follow-up Information    Patsey Berthold, NP Follow up on 08/30/2017.   Specialty:  Cardiology Why:  at 8:20AM Contact information: [place] [address] [phone]          Discharge Instructions    Amb Referral to Cardiac Rehabilitation   Complete by:  As directed    Diagnosis:  Coronary Stents   Call MD for:  redness, tenderness, or signs  of infection (pain, swelling, redness, odor or green/yellow discharge around incision site)   Complete by:  As directed    Diet - low sodium heart healthy   Complete by:  As directed    Discharge instructions   Complete by:  As directed    Groin Site Care Refer to this sheet in the next few weeks. These instructions provide you with information on caring for yourself after your procedure. Your caregiver may also give you more specific instructions. Your treatment has been planned according to current medical practices, but problems sometimes occur. Call your caregiver if you have any problems or questions after your procedure. HOME CARE INSTRUCTIONS You may shower 24 hours after the procedure. Remove the bandage (dressing) and gently wash the site with plain soap and water. Gently pat the site dry.  Do not apply powder or lotion to the site.  Do not sit in a bathtub, swimming pool, or whirlpool for 5 to 7 days.  No bending, squatting, or lifting anything over 10 pounds (4.5 kg) as directed by your caregiver.  Inspect the site at least twice daily.  Do not drive home if you are discharged the same day of the procedure. Have someone else drive you.  You may drive 24 hours after the procedure unless otherwise instructed by your caregiver.  What to expect: Any bruising will usually fade within 1 to 2 weeks.  Blood that collects in the tissue (hematoma) may be painful to the touch. It should usually decrease in size and tenderness within 1 to 2 weeks.  SEEK IMMEDIATE MEDICAL CARE IF: You have unusual pain at the groin site or down the affected leg.  You have redness, warmth, swelling, or pain at the groin site.  You have drainage (other than a small amount of blood on the dressing).  You have chills.  You have a fever or persistent symptoms for more than 72 hours.  You have a fever and your symptoms suddenly get worse.  Your leg becomes pale, cool, tingly, or numb.  You have heavy bleeding from  the site. Hold pressure on the site. Marland Kitchen  PLEASE DO NOT MISS ANY DOSES OF YOUR PLAVIX!!!!! Also keep a log of you blood pressures and bring back to your follow up appt. Please call the office with any questions.   Patients taking blood thinners should generally stay away from medicines like ibuprofen, Advil, Motrin, naproxen, and Aleve due to risk of stomach bleeding. You may take Tylenol as directed or talk to your primary doctor about alternatives.   Increase activity slowly   Complete by:  As directed       Discharge Medications     Medication List    TAKE these medications   allopurinol 300 MG tablet Commonly known as:  ZYLOPRIM Take 600 mg by mouth daily.   amoxicillin 500 MG tablet Commonly known as:  AMOXIL Take 4 tablets (2,000 mg total) by mouth as needed. Take 4 of the 500 mg tablets = 2000 mg 60 minutes before dental procedure   aspirin 81 MG chewable tablet Chew 1 tablet (81 mg total) by mouth daily.   atorvastatin 20 MG tablet Commonly known as:  LIPITOR Take 20 mg by mouth every evening.   CITRUCEL PO Take by mouth as directed. Take 1 tablespoon at night   clopidogrel 75 MG tablet Commonly known as:  PLAVIX Take 1 tablet (75 mg total) by mouth daily with breakfast.   COLACE PO Take 2 tablets by mouth daily as needed (constipation).   fluticasone 50 MCG/ACT nasal spray Commonly known as:  FLONASE Place 1 spray into both nostrils 2 (two) times daily.   furosemide 40 MG tablet Commonly known as:  LASIX Take 1.5 tablets (60 mg total) by mouth daily.   latanoprost 0.005 % ophthalmic solution Commonly known as:  XALATAN Place 1 drop into both eyes at bedtime.   levothyroxine 112 MCG tablet Commonly known as:  SYNTHROID, LEVOTHROID Take 112 mcg by mouth daily before breakfast.   losartan 100 MG tablet Commonly known as:  COZAAR Take 100 mg by mouth daily.   Magnesium 250 MG Tabs Take 1 tablet by mouth 2 (two) times daily.   metoprolol succinate 50  MG 24 hr tablet Commonly known as:  TOPROL-XL Take 1 tablet (50 mg total) by mouth daily. Take 25 mg in the morning and 50 mg in the evening What changed:    how much to take  how to take this  when to take this  additional instructions  Another medication with the same name was  removed. Continue taking this medication, and follow the directions you see here.   multivitamin per tablet Take 1 tablet by mouth at bedtime.   nitroGLYCERIN 0.4 MG SL tablet Commonly known as:  NITROSTAT Place 0.4 mg under the tongue every 5 (five) minutes x 3 doses as needed. For chest pain   potassium chloride SA 20 MEQ tablet Commonly known as:  K-DUR,KLOR-CON Take 1.5 tablets (30 mEq total) by mouth daily.   pyridOXINE 100 MG tablet Commonly known as:  VITAMIN B-6 Take 100 mg by mouth daily.   tamsulosin 0.4 MG Caps capsule Commonly known as:  FLOMAX Take 0.4 mg by mouth daily.        Aspirin prescribed at discharge?  Yes High Intensity Statin Prescribed? (Lipitor 40-80mg  or Crestor 20-40mg ): Yes Beta Blocker Prescribed? Yes For EF <40%, was ACEI/ARB Prescribed? Yes ADP Receptor Inhibitor Prescribed? (i.e. Plavix etc.-Includes Medically Managed Patients): Yes For EF <40%, Aldosterone Inhibitor Prescribed? No: EF 40% Was EF assessed during THIS hospitalization? Yes Was Cardiac Rehab II ordered? (Included Medically managed Patients): Yes   Outstanding Labs/Studies   N/a   Duration of Discharge Encounter   Greater than 30 minutes including physician time.  Signed, Reino Bellis NP-C 08/10/2017, 10:02 AM   Patient seen and examined and history reviewed. Agree with above findings and plan. Mr. Marsalis feels great today. No chest pain or dyspnea with ambulation. Lungs are clear. No groin hematoma. Good diuretic response to lasix. Labs are stable. He is ready for DC home today. On chronic ASA, Plavix, lasix.   Marvetta Vohs Martinique, [place] 08/10/2017 10:13 AM
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
## 2017-08-30 ENCOUNTER — Encounter: Payer: Medicare HMO | Admitting: *Deleted

## 2017-08-30 ENCOUNTER — Ambulatory Visit: Payer: Medicare HMO | Admitting: Nurse Practitioner

## 2017-08-30 ENCOUNTER — Encounter: Payer: Self-pay | Admitting: Nurse Practitioner

## 2017-08-30 ENCOUNTER — Telehealth: Payer: Self-pay | Admitting: Cardiology

## 2017-08-30 VITALS — BP 132/62 | HR 67 | Resp 16 | Ht 68.0 in | Wt 188.2 lb

## 2017-08-30 DIAGNOSIS — I350 Nonrheumatic aortic (valve) stenosis: Secondary | ICD-10-CM

## 2017-08-30 DIAGNOSIS — I441 Atrioventricular block, second degree: Secondary | ICD-10-CM | POA: Diagnosis not present

## 2017-08-30 DIAGNOSIS — I4729 Other ventricular tachycardia: Secondary | ICD-10-CM

## 2017-08-30 DIAGNOSIS — I25810 Atherosclerosis of coronary artery bypass graft(s) without angina pectoris: Secondary | ICD-10-CM

## 2017-08-30 DIAGNOSIS — I472 Ventricular tachycardia: Secondary | ICD-10-CM | POA: Diagnosis not present

## 2017-08-30 DIAGNOSIS — I4892 Unspecified atrial flutter: Secondary | ICD-10-CM | POA: Diagnosis not present

## 2017-08-30 NOTE — Patient Instructions (Addendum)
Medication Instructions:   Your physician recommends that you continue on your current medications as directed. Please refer to the Current Medication list given to you today.   If you need a refill on your cardiac medications before your next appointment, please call your pharmacy.  Labwork:  NONE ORDERED  TODAY    Testing/Procedures:  NONE ORDERED  TODAY    Follow-Up:  IN April WITH DR COOPER    Your physician wants you to follow-up in:  IN  [place] DR. ALLRED  You will receive a reminder letter in the mail two months in advance. If you don't receive a letter, please call our office to schedule the follow-up appointment.  Remote monitoring is used to monitor your Pacemaker of ICD from home. This monitoring reduces the number of office visits required to check your device to one time per year. It allows [place] to keep an eye on the functioning of your device to ensure it is working properly. You are scheduled for a device check from home on .11-29-17  You may send your transmission at any time that day. If you have a wireless device, the transmission will be sent automatically. After your physician reviews your transmission, you will receive a postcard with your next transmission date.     Any Other Special Instructions Will Be Listed Below (If Applicable).

## 2017-08-30 NOTE — Telephone Encounter (Signed)
LMOVM reminding pt to send remote transmission.   

## 2017-09-06 ENCOUNTER — Encounter: Payer: Self-pay | Admitting: Cardiology

## 2017-09-06 DIAGNOSIS — J40 Bronchitis, not specified as acute or chronic: Secondary | ICD-10-CM | POA: Diagnosis not present

## 2017-09-06 DIAGNOSIS — J988 Other specified respiratory disorders: Secondary | ICD-10-CM | POA: Diagnosis not present

## 2017-09-12 LAB — CUP PACEART INCLINIC DEVICE CHECK
Date Time Interrogation Session: 20190123084516
Implantable Lead Implant Date: 20170907
Implantable Lead Implant Date: 20170907
Implantable Lead Location: 753858
Implantable Lead Location: 753859
Implantable Pulse Generator Implant Date: 20170907
MDC IDC LEAD IMPLANT DT: 20170907
MDC IDC LEAD LOCATION: 753860

## 2017-10-12 DIAGNOSIS — S81012A Laceration without foreign body, left knee, initial encounter: Secondary | ICD-10-CM | POA: Diagnosis not present

## 2017-11-10 DIAGNOSIS — S0093XA Contusion of unspecified part of head, initial encounter: Secondary | ICD-10-CM | POA: Diagnosis not present

## 2017-11-10 DIAGNOSIS — S0003XA Contusion of scalp, initial encounter: Secondary | ICD-10-CM | POA: Diagnosis not present

## 2017-11-10 DIAGNOSIS — R55 Syncope and collapse: Secondary | ICD-10-CM | POA: Diagnosis not present

## 2017-11-10 DIAGNOSIS — W19XXXA Unspecified fall, initial encounter: Secondary | ICD-10-CM | POA: Diagnosis not present

## 2017-11-10 DIAGNOSIS — M25562 Pain in left knee: Secondary | ICD-10-CM | POA: Diagnosis not present

## 2017-11-10 DIAGNOSIS — R079 Chest pain, unspecified: Secondary | ICD-10-CM | POA: Diagnosis not present

## 2017-11-10 DIAGNOSIS — Z952 Presence of prosthetic heart valve: Secondary | ICD-10-CM | POA: Diagnosis not present

## 2017-11-10 DIAGNOSIS — S0031XA Abrasion of nose, initial encounter: Secondary | ICD-10-CM | POA: Diagnosis not present

## 2017-11-10 DIAGNOSIS — I10 Essential (primary) hypertension: Secondary | ICD-10-CM | POA: Diagnosis not present

## 2017-11-10 DIAGNOSIS — S6992XA Unspecified injury of left wrist, hand and finger(s), initial encounter: Secondary | ICD-10-CM | POA: Diagnosis not present

## 2017-11-10 DIAGNOSIS — M542 Cervicalgia: Secondary | ICD-10-CM | POA: Diagnosis not present

## 2017-11-10 DIAGNOSIS — D649 Anemia, unspecified: Secondary | ICD-10-CM | POA: Diagnosis not present

## 2017-11-10 DIAGNOSIS — M79642 Pain in left hand: Secondary | ICD-10-CM | POA: Diagnosis not present

## 2017-11-10 DIAGNOSIS — Z743 Need for continuous supervision: Secondary | ICD-10-CM | POA: Diagnosis not present

## 2017-11-10 DIAGNOSIS — S3993XA Unspecified injury of pelvis, initial encounter: Secondary | ICD-10-CM | POA: Diagnosis not present

## 2017-11-10 DIAGNOSIS — S80811A Abrasion, right lower leg, initial encounter: Secondary | ICD-10-CM | POA: Diagnosis not present

## 2017-11-10 DIAGNOSIS — S299XXA Unspecified injury of thorax, initial encounter: Secondary | ICD-10-CM | POA: Diagnosis not present

## 2017-11-10 DIAGNOSIS — S8992XA Unspecified injury of left lower leg, initial encounter: Secondary | ICD-10-CM | POA: Diagnosis not present

## 2017-11-10 DIAGNOSIS — S0083XA Contusion of other part of head, initial encounter: Secondary | ICD-10-CM | POA: Diagnosis not present

## 2017-11-10 DIAGNOSIS — I2510 Atherosclerotic heart disease of native coronary artery without angina pectoris: Secondary | ICD-10-CM | POA: Diagnosis not present

## 2017-11-10 DIAGNOSIS — M25561 Pain in right knee: Secondary | ICD-10-CM | POA: Diagnosis not present

## 2017-11-10 DIAGNOSIS — S8991XA Unspecified injury of right lower leg, initial encounter: Secondary | ICD-10-CM | POA: Diagnosis not present

## 2017-11-10 DIAGNOSIS — R102 Pelvic and perineal pain: Secondary | ICD-10-CM | POA: Diagnosis not present

## 2017-11-10 DIAGNOSIS — R7989 Other specified abnormal findings of blood chemistry: Secondary | ICD-10-CM | POA: Diagnosis not present

## 2017-11-11 DIAGNOSIS — S299XXA Unspecified injury of thorax, initial encounter: Secondary | ICD-10-CM | POA: Diagnosis not present

## 2017-11-11 DIAGNOSIS — S0083XA Contusion of other part of head, initial encounter: Secondary | ICD-10-CM | POA: Diagnosis not present

## 2017-11-11 DIAGNOSIS — M25561 Pain in right knee: Secondary | ICD-10-CM | POA: Diagnosis not present

## 2017-11-11 DIAGNOSIS — M542 Cervicalgia: Secondary | ICD-10-CM | POA: Diagnosis not present

## 2017-11-11 DIAGNOSIS — I214 Non-ST elevation (NSTEMI) myocardial infarction: Secondary | ICD-10-CM | POA: Diagnosis not present

## 2017-11-11 DIAGNOSIS — I5023 Acute on chronic systolic (congestive) heart failure: Secondary | ICD-10-CM | POA: Diagnosis not present

## 2017-11-11 DIAGNOSIS — I2510 Atherosclerotic heart disease of native coronary artery without angina pectoris: Secondary | ICD-10-CM | POA: Diagnosis not present

## 2017-11-11 DIAGNOSIS — Z952 Presence of prosthetic heart valve: Secondary | ICD-10-CM | POA: Diagnosis not present

## 2017-11-11 DIAGNOSIS — S0093XA Contusion of unspecified part of head, initial encounter: Secondary | ICD-10-CM | POA: Diagnosis not present

## 2017-11-11 DIAGNOSIS — S6992XA Unspecified injury of left wrist, hand and finger(s), initial encounter: Secondary | ICD-10-CM | POA: Diagnosis not present

## 2017-11-11 DIAGNOSIS — D539 Nutritional anemia, unspecified: Secondary | ICD-10-CM | POA: Diagnosis not present

## 2017-11-11 DIAGNOSIS — E039 Hypothyroidism, unspecified: Secondary | ICD-10-CM | POA: Diagnosis not present

## 2017-11-11 DIAGNOSIS — I359 Nonrheumatic aortic valve disorder, unspecified: Secondary | ICD-10-CM | POA: Diagnosis not present

## 2017-11-11 DIAGNOSIS — S8991XA Unspecified injury of right lower leg, initial encounter: Secondary | ICD-10-CM | POA: Diagnosis not present

## 2017-11-11 DIAGNOSIS — I509 Heart failure, unspecified: Secondary | ICD-10-CM | POA: Diagnosis not present

## 2017-11-11 DIAGNOSIS — S0031XA Abrasion of nose, initial encounter: Secondary | ICD-10-CM | POA: Diagnosis not present

## 2017-11-11 DIAGNOSIS — I348 Other nonrheumatic mitral valve disorders: Secondary | ICD-10-CM | POA: Diagnosis not present

## 2017-11-11 DIAGNOSIS — R55 Syncope and collapse: Secondary | ICD-10-CM | POA: Diagnosis not present

## 2017-11-11 DIAGNOSIS — S0003XA Contusion of scalp, initial encounter: Secondary | ICD-10-CM | POA: Diagnosis not present

## 2017-11-11 DIAGNOSIS — M25562 Pain in left knee: Secondary | ICD-10-CM | POA: Diagnosis not present

## 2017-11-11 DIAGNOSIS — D649 Anemia, unspecified: Secondary | ICD-10-CM | POA: Diagnosis not present

## 2017-11-11 DIAGNOSIS — R079 Chest pain, unspecified: Secondary | ICD-10-CM | POA: Diagnosis not present

## 2017-11-11 DIAGNOSIS — T82855A Stenosis of coronary artery stent, initial encounter: Secondary | ICD-10-CM | POA: Diagnosis not present

## 2017-11-11 DIAGNOSIS — R7989 Other specified abnormal findings of blood chemistry: Secondary | ICD-10-CM | POA: Diagnosis not present

## 2017-11-11 DIAGNOSIS — W19XXXA Unspecified fall, initial encounter: Secondary | ICD-10-CM | POA: Diagnosis not present

## 2017-11-11 DIAGNOSIS — S80211A Abrasion, right knee, initial encounter: Secondary | ICD-10-CM | POA: Diagnosis not present

## 2017-11-11 DIAGNOSIS — M79642 Pain in left hand: Secondary | ICD-10-CM | POA: Diagnosis not present

## 2017-11-11 DIAGNOSIS — R102 Pelvic and perineal pain: Secondary | ICD-10-CM | POA: Diagnosis not present

## 2017-11-11 DIAGNOSIS — S3993XA Unspecified injury of pelvis, initial encounter: Secondary | ICD-10-CM | POA: Diagnosis not present

## 2017-11-11 DIAGNOSIS — D509 Iron deficiency anemia, unspecified: Secondary | ICD-10-CM | POA: Diagnosis not present

## 2017-11-11 DIAGNOSIS — S8992XA Unspecified injury of left lower leg, initial encounter: Secondary | ICD-10-CM | POA: Diagnosis not present

## 2017-11-11 DIAGNOSIS — S60411A Abrasion of left index finger, initial encounter: Secondary | ICD-10-CM | POA: Diagnosis not present

## 2017-11-11 DIAGNOSIS — I360 Nonrheumatic tricuspid (valve) stenosis: Secondary | ICD-10-CM | POA: Diagnosis not present

## 2017-11-11 DIAGNOSIS — I10 Essential (primary) hypertension: Secondary | ICD-10-CM | POA: Diagnosis not present

## 2017-11-11 DIAGNOSIS — S80212A Abrasion, left knee, initial encounter: Secondary | ICD-10-CM | POA: Diagnosis not present

## 2017-11-11 DIAGNOSIS — R531 Weakness: Secondary | ICD-10-CM | POA: Diagnosis not present

## 2017-11-19 ENCOUNTER — Telehealth: Payer: Self-pay | Admitting: Nurse Practitioner

## 2017-11-19 NOTE — Telephone Encounter (Signed)
Attempted to contact patient but there was no answer and VM not setup. 

## 2017-11-19 NOTE — Telephone Encounter (Signed)
New Message   Patient states that he had a heart catherization in [place] on 11/12/2017. He wants to discuss the treatment plan they gave him in [place] to make sure that [facility] is in agreement with the plan. Please call to discuss.

## 2017-11-21 NOTE — Telephone Encounter (Signed)
I spoke with pt who is asking if he needs to see Chanetta Marshall, NP regarding recent medication changes.  He is seeing [name], PA on 4/12.  Pt reports he was in [place] and passed out.  Pacemaker check was OK.  Losartan was changed. Pt states he had a catheterization done. Was told event was probably related to low BP.  Pt reports he is feeling fine now.   I advised him to keep appt with [name], PA as planned and medication changes would be reviewed at that visit. Follow up with EP would be determined at that time.

## 2017-11-21 NOTE — Telephone Encounter (Signed)
Agree. thanks

## 2017-11-29 ENCOUNTER — Ambulatory Visit (INDEPENDENT_AMBULATORY_CARE_PROVIDER_SITE_OTHER): Payer: Medicare HMO | Admitting: *Deleted

## 2017-11-29 ENCOUNTER — Ambulatory Visit: Payer: Medicare HMO | Admitting: Cardiovascular Disease

## 2017-11-29 DIAGNOSIS — E039 Hypothyroidism, unspecified: Secondary | ICD-10-CM | POA: Diagnosis not present

## 2017-11-29 DIAGNOSIS — I2510 Atherosclerotic heart disease of native coronary artery without angina pectoris: Secondary | ICD-10-CM | POA: Diagnosis not present

## 2017-11-29 DIAGNOSIS — Z125 Encounter for screening for malignant neoplasm of prostate: Secondary | ICD-10-CM | POA: Diagnosis not present

## 2017-11-29 DIAGNOSIS — I441 Atrioventricular block, second degree: Secondary | ICD-10-CM | POA: Diagnosis not present

## 2017-11-29 DIAGNOSIS — I10 Essential (primary) hypertension: Secondary | ICD-10-CM | POA: Diagnosis not present

## 2017-11-29 DIAGNOSIS — N400 Enlarged prostate without lower urinary tract symptoms: Secondary | ICD-10-CM | POA: Diagnosis not present

## 2017-11-29 DIAGNOSIS — Z0000 Encounter for general adult medical examination without abnormal findings: Secondary | ICD-10-CM | POA: Diagnosis not present

## 2017-11-29 NOTE — Progress Notes (Signed)
Remote pacemaker transmission.   

## 2017-11-30 ENCOUNTER — Encounter: Payer: Self-pay | Admitting: Physician Assistant

## 2017-11-30 ENCOUNTER — Ambulatory Visit: Payer: Medicare HMO | Admitting: Physician Assistant

## 2017-11-30 VITALS — BP 128/78 | HR 80 | Ht 68.0 in | Wt 187.0 lb

## 2017-11-30 DIAGNOSIS — R55 Syncope and collapse: Secondary | ICD-10-CM

## 2017-11-30 DIAGNOSIS — I10 Essential (primary) hypertension: Secondary | ICD-10-CM | POA: Diagnosis not present

## 2017-11-30 DIAGNOSIS — Z952 Presence of prosthetic heart valve: Secondary | ICD-10-CM

## 2017-11-30 DIAGNOSIS — I25810 Atherosclerosis of coronary artery bypass graft(s) without angina pectoris: Secondary | ICD-10-CM

## 2017-11-30 DIAGNOSIS — I5042 Chronic combined systolic (congestive) and diastolic (congestive) heart failure: Secondary | ICD-10-CM | POA: Diagnosis not present

## 2017-11-30 DIAGNOSIS — I779 Disorder of arteries and arterioles, unspecified: Secondary | ICD-10-CM | POA: Diagnosis not present

## 2017-11-30 DIAGNOSIS — I739 Peripheral vascular disease, unspecified: Secondary | ICD-10-CM

## 2017-11-30 DIAGNOSIS — Z950 Presence of cardiac pacemaker: Secondary | ICD-10-CM | POA: Diagnosis not present

## 2017-11-30 NOTE — Progress Notes (Signed)
Cardiology Office Note:    Date:  11/30/2017   ID:  DACODA FINLAY, DOB 1931-06-26, MRN [id number]  PCP:  [name], MD  Cardiologist:  [name], MD  Electrophysiologist:  Dr. [name]      Referring MD: [name], MD   Chief Complaint  Patient presents with  . Hospitalization Follow-up    admx w/ syncope    History of Present Illness:    [name] is a 84 y.o. male with coronary artery disease status post CABG in 2010 and subsequent PCI with stenting of the vein graft to the OM1 in 2017, aortic stenosis status post TAVR in September 2017, complete heart block status post CRT-P ([employer]tr[employer] MRI compatible), combined systolic and diastolic heart failure, hypertension, hyperlipidemia.  Last seen in clinic by Dr. Burt Knack September 2018.  He was admitted in December 2018 with worsening shortness of breath.  Cardiac catheterization demonstrated patent SVG-OM1 and patent LIMA-LAD.  He had severe disease in the ostial portion and distal portion of the SVG-RPDA.  Both lesions were treated with drug-eluting stents.  His wedge pressure was elevated and he was diuresed overnight prior to discharge.  He was seen in follow-up by Chanetta Marshall, NP 08/30/17.     Mr. Villalva returns for post-hospitalization follow up.  He was admitted to [facility] in [place], [place] at the end of March.  He was discharged home on 11/13/17.  He was admitted with syncope.  He had been riding in his car for an hour and a half.  Shortly after standing up, he passed out.  He had multiple ecchymosis to his face.  He denies any fracture.  He notes his head CT was ok.  He tells me that his cardiac enzymes were elevated.  He underwent cardiac catheterization.  He was told that there was a vessel that was too small for a stent that was treated with a balloon.  He also had an echocardiogram and was told that his heart is weak.  His pacer was interrogated.  He was told that this was "okay."  His  losartan and metoprolol were both reduced.  He was told that his blood pressure dropped significantly from lying to standing.  Since discharge, he has felt much better.  He denies dizziness, chest pain, PND.  Lower extremity edema is improved.  Dyspnea with exertion is improved.  He has been walking on a treadmill daily without significant issues.  Prior CV studies:   The following studies were reviewed today:  Cardiac Catheterization 08/09/17 LM ostial 100 RCA proximal 100-CTO SVG-RPDA ostial 90, distal 90 SVG-OM1 stent patent LIMA-LAD patent EF 40-45 Mean PCWP 20; LVEDP 22 PCI: 4 x 12 mm Resolute Onyx DES to the ostial/proximal SVG-RPDA  PCI:  3 x 22 mm Resolute Onyx DES to the distal SVG-RPDA Recommend: Continue clopidogrel without interruption, add aspirin 81 mg daily, diurese overnight with a single dose of IV Lasix and then continue oral furosemide.  If no complications arise should be okay for discharge tomorrow.  Carotid US 05/14/17 L CEA patent; RICA 40-59  Echo 05/09/17 Inferior akinesis, mild LVH, EF 40, status post TAVR with no perivalvular regurgitation, MAC, mild MR, moderate LAE, mild RAE, PASP 36  Echo 06/22/16 Mild LVH, EF 40-45, diffuse HK, grade 2 diastolic dysfunction, status post TAVR with no significant stenosis or perivalvular regurgitation, MAC, trivial MR, mild LAE, mild RAE, PASP 39  Past Medical History:  Diagnosis Date  . Arthritis   .  Benign prostatic hypertrophy   . CAD (coronary artery disease)    DES, SVG to circumflex marginal, October, 2010 ( SVG to PDA and PLA patent , LIMA to LAD patent, EF 60%, mild inferior hypokinesis 12/18 PCI/DES to SVG-PDA x2, EF 40%  . Complete heart block ([place])    a. s/p MDT CRTP 04/2016 Dr [name]  . GERD (gastroesophageal reflux disease)   . History of kidney stones 1970   /notes 01/03/2011  . HTN (hypertension)   . Hyperlipidemia   . Hypothyroidism   . Neuromuscular disorder ([place])   . Peripheral neuropathy   .  Psoriasis    severe; with total skin exfoliation in the past resolved  . S/P TAVR (transcatheter aortic valve replacement)    severe AS >> s/p TAVR 9/17 // Limited Echo 10/17: EF 40-45%, lat and inf-lat HK, TAVR ok, MAC, mild BAE, PASP 31 mmHg, no effusion  . Ventricular tachycardia, non-sustained (HCC)    Surgical Hx: The patient  has a past surgical history that includes decompression of left median nerve; Colonoscopy (11/23/2011); Tonsillectomy; Appendectomy; Total knee arthroplasty (04/16/2012); Cataract extraction, bilateral; Carpal tunnel release (Bilateral, 2009-2011); left heart catheterization with coronary angiogram (N/A, 12/27/2012); TEE without cardioversion (N/A, 04/21/2016); Cardiac catheterization (N/A, 04/27/2016); Transcatheter aortic valve replacement, transfemoral (N/A, 05/16/2016); TEE without cardioversion (N/A, 05/16/2016); Cardiac catheterization (N/A, 11/17/2015); Cardiac catheterization (N/A, 11/17/2015); Cardiac catheterization (N/A, 04/19/2016); Coronary angioplasty with stent; Coronary angioplasty with stent (08/09/2017); Coronary angioplasty with stent (11/17/2015); Lumbar laminectomy (Bilateral, 09/2004); Carotid endarterectomy (Left, 02/2008); Synovial cyst excision (09/2004); Back surgery; Joint replacement; Lumbar disc surgery; Anterior cervical decomp/discectomy fusion; Kidney stone surgery; Coronary artery bypass graft (1995); RIGHT/LEFT HEART CATH AND CORONARY/GRAFT ANGIOGRAPHY (N/A, 08/09/2017); and CORONARY STENT INTERVENTION (N/A, 08/09/2017).   Current Medications: Current Meds  Medication Sig  . allopurinol (ZYLOPRIM) 300 MG tablet Take 600 mg by mouth daily.   Marland Kitchen amoxicillin (AMOXIL) 500 MG tablet Take 4 tablets (2,000 mg total) by mouth as needed. Take 4 of the 500 mg tablets = 2000 mg 60 minutes before dental procedure  . aspirin 81 MG chewable tablet Chew 1 tablet (81 mg total) by mouth daily.  Marland Kitchen atorvastatin (LIPITOR) 20 MG tablet Take 20 mg by mouth every evening.   .  clopidogrel (PLAVIX) 75 MG tablet Take 1 tablet (75 mg total) by mouth daily with breakfast.  . Docusate Sodium (COLACE PO) Take 2 tablets by mouth daily as needed (constipation).   . Ferrous Sulfate (IRON) 325 (65 Fe) MG TABS Take 1 tablet by mouth 2 (two) times daily.  . fluticasone (FLONASE) 50 MCG/ACT nasal spray Place 1 spray into both nostrils 2 (two) times daily.  Marland Kitchen latanoprost (XALATAN) 0.005 % ophthalmic solution Place 1 drop into both eyes at bedtime.  Marland Kitchen levothyroxine (SYNTHROID, LEVOTHROID) 112 MCG tablet Take 112 mcg by mouth daily before breakfast.  . losartan (COZAAR) 50 MG tablet Take 50 mg by mouth daily.  . Magnesium 250 MG TABS Take 1 tablet by mouth 2 (two) times daily.  . Methylcellulose, Laxative, (CITRUCEL PO) Take by mouth as directed. Take 1 tablespoon at night  . metoprolol succinate (TOPROL-XL) 25 MG 24 hr tablet Take 12.5 mg by mouth 2 (two) times daily.  . multivitamin (THERAGRAN) per tablet Take 1 tablet by mouth at bedtime.   . nitroGLYCERIN (NITROSTAT) 0.4 MG SL tablet Place 0.4 mg under the tongue every 5 (five) minutes x 3 doses as needed. For chest pain  . potassium chloride SA (K-DUR,KLOR-CON) 20 MEQ tablet Take  1.5 tablets (30 mEq total) by mouth daily.  Marland Kitchen pyridOXINE (VITAMIN B-6) 100 MG tablet Take 100 mg by mouth daily.   . Tamsulosin HCl (FLOMAX) 0.4 MG CAPS Take 0.4 mg by mouth daily.      Allergies:   Other and Propylene glycol   Social History   Tobacco Use  . Smoking status: Never Smoker  . Smokeless tobacco: Never Used  Substance Use Topics  . Alcohol use: Yes    Alcohol/week: 0.6 oz    Types: 1 Cans of beer per week  . Drug use: No     Family Hx: The patient's family history includes Heart attack in his mother; Heart attack (age of onset: 21) in his son; Heart disease in his father and mother; Hyperlipidemia in his father; Hypertension in his father. There is no history of Colon cancer.  ROS:   Please see the history of present illness.     Review of Systems  Cardiovascular: Positive for dyspnea on exertion and syncope.  Neurological: Positive for loss of balance.   All other systems reviewed and are negative.   EKGs/Labs/Other Test Reviewed:    EKG:  EKG is  ordered today.  The ekg ordered today demonstrates a sensed, V paced, HR 76  Recent Labs: 01/24/2017: Magnesium 2.3 08/07/2017: NT-Pro BNP 2,488 08/10/2017: BUN 18; Creatinine, Ser 1.08; Hemoglobin 9.9; Platelets 137; Potassium 3.5; Sodium 135   Recent Lipid Panel Lab Results  Component Value Date/Time   CHOL 143 08/06/2006 08:14 AM   TRIG 141 08/06/2006 08:14 AM   HDL 29.7 (L) 08/06/2006 08:14 AM   CHOLHDL 4.8 CALC 08/06/2006 08:14 AM   LDLCALC 85 08/06/2006 08:14 AM    Physical Exam:    VS:  BP 128/78   Pulse 80   Ht _0  (1.727 m)   Wt 187 lb (84.8 kg)   SpO2 98%   BMI 28.43 kg/m     Wt Readings from Last 3 Encounters:  11/30/17 187 lb (84.8 kg)  08/30/17 188 lb 3.2 oz (85.4 kg)  08/10/17 189 lb 9.5 oz (86 kg)     Physical Exam  Constitutional: He is oriented to person, place, and time. He appears well-developed and well-nourished. No distress.  HENT:  Head: Normocephalic and atraumatic.  Neck: JVD (JVP 7 cm) present.  Cardiovascular: Normal rate and regular rhythm.  No murmur heard. Pulmonary/Chest: Effort normal. He has no rales.  Abdominal: Soft.  Musculoskeletal: He exhibits edema (trace bilat LE edema).  Neurological: He is alert and oriented to person, place, and time.  Skin: Skin is warm and dry.    ASSESSMENT & PLAN:    #1.  Syncope, unspecified syncope type He was admitted to a hospital in [facility] 3 weeks ago with what sounds like vasovagal syncope.  His BP medications were reduced 2/2 orthostatic hypotension.  Unfortunately, I have no records to review.  He underwent Cardiac Catheterization, echocardiogram.  He apparently had balloon angioplasty but no stent.  He was told the stenosis found was incidental and not related to his  syncope.  I reviewed with our device clinic and we did pull up his last remote transmissions.  No high rate ventricular episodes were noted.  He is doing well without any significant symptoms now.  We need to review his records from the hospital in order to determine if he needs any other evaluation .  -Request records from recent hospital stay  -Follow up 4 weeks  #2.  Coronary artery disease History of  CABG and subsequent PCI to the SVG-OM1.  He underwent cardiac catheterization in December 2018 and was found to have high-grade stenosis in the vein graft to the RPDA.  He received 2 drug-eluting stents at that time.  As noted, he was recently admitted to hospital in [place] with syncope.  It sounds as though his cardiac enzymes were elevated and he underwent balloon angioplasty to an unknown vessel.  Records will be requested.  He is currently doing well without angina.  Continue aspirin, Plavix, statin, beta-blocker, ARB.  #3.  Chronic combined systolic and diastolic CHF (congestive heart failure) (HCC) EF 40% by echocardiogram September 2018.  He did have another echocardiogram while he was hospitalized in [place].  As noted, records will be obtained.  Currently, his volume status appears stable.  He did have labs drawn by his primary care doctor yesterday.  We will request those.  Continue current dose of ARB, beta-blocker.  #4.  Essential hypertension It sounds as though his recent admission with syncope was related to orthostatic hypotension.  His medication doses were reduced and he feels much better.  Currently, his blood pressure is well controlled.  Continue current regimen.  #5.  Bilateral carotid artery disease, unspecified type ([place]) Managed by vascular surgery.  #6.  Severe Aortic Stenosis S/P TAVR (transcatheter aortic valve replacement) Well-functioning aortic valve prosthesis at echocardiogram 9/18.  Echo from hospital in [place] will be obtained.  Continue SBE prophylaxis.  #7.   Cardiac pacemaker in situ Follow-up with EP as planned.   Dispo:  Return in about 1 month (around 12/28/2017) for Routine Follow Up, w/ Dr. Burt Knack, or [name], PA-C.   Medication Adjustments/Labs and Tests Ordered: Current medicines are reviewed at length with the patient today.  Concerns regarding medicines are outlined above.  Tests Ordered: Orders Placed This Encounter  Procedures  . EKG 12-Lead   Medication Changes: No orders of the defined types were placed in this encounter.   Signed, [name], PA-C  11/30/2017 1:44 PM    [facility] [address], [place], [place]  [address] Phone: [phone]; Fax: [phone]

## 2017-11-30 NOTE — Patient Instructions (Addendum)
Medication Instructions:  No changes  Labwork: None - We will get labs from your primary care doctor that were done yesterday  Testing/Procedures: None   Follow-Up: [name], [facility] 12/31/17 @ 11:45   Any Other Special Instructions Will Be Listed Below (If Applicable).  We will get records from the hospital in [place].  If you need a refill on your cardiac medications before your next appointment, please call your pharmacy.

## 2017-12-03 ENCOUNTER — Telehealth: Payer: Self-pay

## 2017-12-03 ENCOUNTER — Telehealth: Payer: Self-pay | Admitting: Physician Assistant

## 2017-12-03 NOTE — Telephone Encounter (Signed)
NOTES ON FILE 

## 2017-12-03 NOTE — Telephone Encounter (Signed)
Records received [place] placed in chart Prep.

## 2017-12-05 ENCOUNTER — Encounter: Payer: Self-pay | Admitting: Cardiology

## 2017-12-06 DIAGNOSIS — R55 Syncope and collapse: Secondary | ICD-10-CM | POA: Diagnosis not present

## 2017-12-06 DIAGNOSIS — D649 Anemia, unspecified: Secondary | ICD-10-CM | POA: Diagnosis not present

## 2017-12-06 DIAGNOSIS — I2510 Atherosclerotic heart disease of native coronary artery without angina pectoris: Secondary | ICD-10-CM | POA: Diagnosis not present

## 2017-12-06 DIAGNOSIS — Z6828 Body mass index (BMI) 28.0-28.9, adult: Secondary | ICD-10-CM | POA: Diagnosis not present

## 2017-12-06 IMAGING — CT CT CTA ABD/PEL W/CM AND/OR W/O CM
1 of 9 series · 1 of 46 positions shown, 2 images · IV contrast (Iodine)
Comparison: Chest CT 12/11/2012.

CLINICAL DATA: 85-year-old male with history of severe aortic
stenosis. Preprocedural study prior to potential transcatheter
aortic valve replacement (TAVR) procedure.

EXAM:
CT ANGIOGRAPHY CHEST, ABDOMEN AND PELVIS
TECHNIQUE: Multidetector CT imaging through the chest, abdomen and pelvis was
performed using the standard protocol during bolus administration of
intravenous contrast. Multiplanar reconstructed images and MIPs were
obtained and reviewed to evaluate the vascular anatomy.
CONTRAST:  70 mL of Isovue 370.

[Series 200: locator · axial · 0.59mm/px · z∈[-202,-202]mm · 1 of 1 slices shown, 2 images]
[im 1/1  soft-tissue]
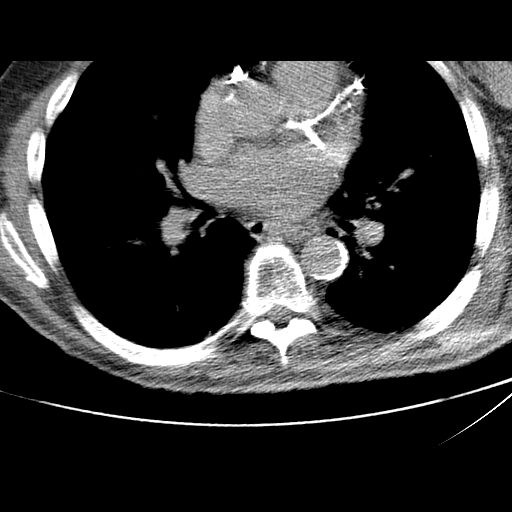
[im 1/1  bone]
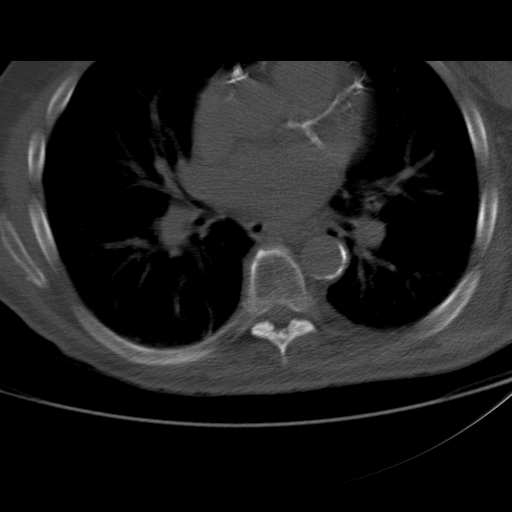

[1 of 46 positions shown; findings below may reference images not displayed]

FINDINGS: CTA CHEST FINDINGS

Mediastinum/Lymph Nodes: Heart size is borderline enlarged. There is
no significant pericardial fluid, thickening or pericardial
calcification. There is aortic atherosclerosis, as well as
atherosclerosis of the great vessels of the mediastinum and the
coronary arteries, including calcified atherosclerotic plaque in the
left main, left anterior descending, left circumflex and right
coronary arteries. Status post median sternotomy for CABG, including
[REDACTED] to the LAD. Thickening calcification of the aortic valve
(severe). No pathologically enlarged mediastinal or hilar lymph
nodes. Circumferential thickening of the distal esophagus. No
axillary lymphadenopathy.

Lungs/Pleura: Linear areas of architectural distortion in the lower
lobes of the lungs bilaterally (left greater than right), most
compatible with areas of chronic post infectious or inflammatory
scarring, slightly increased compared to prior study from
12/11/2012. There is an 8 mm pulmonary nodule in the left lower lobe
(image 49 of series 407) which is unchanged compared to prior study
from 12/11/2012, considered definitively benign. No other suspicious
appearing pulmonary nodules or masses are noted. No acute
consolidative airspace disease. No pleural effusions.

Musculoskeletal/Soft Tissues: Median sternotomy wires. Orthopedic
fixation hardware in the lower cervical spine. There are no
aggressive appearing lytic or blastic lesions noted in the
visualized portions of the skeleton.

CTA ABDOMEN AND PELVIS FINDINGS

Hepatobiliary: Liver has a slightly irregular contour, which could
indicate early changes of cirrhosis. No discrete cystic or solid
hepatic lesion. No intra or extrahepatic biliary ductal dilatation.
Gallbladder is normal in appearance.

Pancreas: No pancreatic mass. No pancreatic ductal dilatation. No
pancreatic or peripancreatic fluid or inflammatory changes.

Spleen: Unremarkable.

Adrenals/Urinary Tract: Mild bilateral renal atrophy and multifocal
cortical thinning. No suspicious renal lesions. Several sub cm
low-attenuation lesions are noted in the kidneys bilaterally, too
small to definitively characterize, but statistically likely to
represent tiny cysts. No hydroureteronephrosis. Urinary bladder is
normal in appearance. Bilateral adrenal glands are normal in
appearance.

Stomach/Bowel: Normal appearance of the stomach. No pathologic
dilatation of small bowel or colon. The appendix is not confidently
identified and may be surgically absent. Regardless, there are no
inflammatory changes noted adjacent to the cecum to suggest the
presence of an acute appendicitis at this time.

Vascular/Lymphatic: Aortic atherosclerosis, without evidence of
aneurysm or dissection in the abdominal or pelvic vasculature.
Vascular findings and measurements pertinent to potential TAVR
procedure, as detailed below. 2 left-sided renal arteries appear
widely patent, as does a single right-sided renal artery. Celiac
axis, superior mesenteric artery and inferior mesenteric artery all
appear widely patent, without definite hemodynamically significant
stenosis. No lymphadenopathy noted in the abdomen or pelvis.

Reproductive: Prostate gland and seminal vesicles are unremarkable
in appearance.

Other: No significant volume of ascites.  No pneumoperitoneum.

Musculoskeletal: Postoperative changes of laminectomy at L3-L4.
x 2.2 cm fatty attenuation lesion in the left proximal vastus
lateralis muscle, presumably a small intramuscular lipoma. There are
no aggressive appearing lytic or blastic lesions noted in the
visualized portions of the skeleton.

VASCULAR MEASUREMENTS PERTINENT TO TAVR:

AORTA:

Minimal Aortic Diameter -  14 x 12 mm

Severity of Aortic Calcification -  severe

RIGHT PELVIS:

Right Common Iliac Artery -

Minimal Diameter - 7.5 x 7.2 mm

Tortuosity - mild

Calcification - moderate to severe

Right External Iliac Artery -

Minimal Diameter - 8.8 x 6.7 mm

Tortuosity - moderate to severe

Calcification - mild

Right Common Femoral Artery -

Minimal Diameter - 6.1 x 5.9 mm

Tortuosity - moderate

Calcification - mild

LEFT PELVIS:

Left Common Iliac Artery -

Minimal Diameter - 7.9 x 7.1 mm

Tortuosity - mild

Calcification - moderate to severe

Left External Iliac Artery -

Minimal Diameter - 7.6 x 6.9 mm

Tortuosity - moderate to severe

Calcification - mild

Left Common Femoral Artery -

Minimal Diameter - 8.1 x 5.7 mm

Tortuosity - mild

Calcification - moderate

Review of the MIP images confirms the above findings.
IMPRESSION: 1. Vascular findings and measurements pertinent to potential TAVR
procedure, as detailed above. This patient appears to have suitable
pelvic arterial access bilaterally.
2. Severe thickening calcification of the aortic valve, compatible
with the reported clinical history of severe aortic stenosis.
3. Marked thickening of the distal third of the esophagus. This may
simply reflect chronic changes of reflux esophagitis. However, if
there is any clinical concern for Carnegie metaplasia or esophageal
neoplasia, correlation with endoscopy should be considered.
4. Aortic atherosclerosis, in addition to left main and 3 vessel
coronary artery disease. Status post median sternotomy for CABG,
including [REDACTED] to the LAD.
5. Additional incidental findings, as above.

## 2017-12-11 DIAGNOSIS — E039 Hypothyroidism, unspecified: Secondary | ICD-10-CM | POA: Diagnosis not present

## 2017-12-11 DIAGNOSIS — I110 Hypertensive heart disease with heart failure: Secondary | ICD-10-CM | POA: Diagnosis not present

## 2017-12-11 DIAGNOSIS — H409 Unspecified glaucoma: Secondary | ICD-10-CM | POA: Diagnosis not present

## 2017-12-11 DIAGNOSIS — I2510 Atherosclerotic heart disease of native coronary artery without angina pectoris: Secondary | ICD-10-CM | POA: Diagnosis not present

## 2017-12-11 DIAGNOSIS — I252 Old myocardial infarction: Secondary | ICD-10-CM | POA: Diagnosis not present

## 2017-12-11 DIAGNOSIS — G629 Polyneuropathy, unspecified: Secondary | ICD-10-CM | POA: Diagnosis not present

## 2017-12-11 DIAGNOSIS — I509 Heart failure, unspecified: Secondary | ICD-10-CM | POA: Diagnosis not present

## 2017-12-11 DIAGNOSIS — E785 Hyperlipidemia, unspecified: Secondary | ICD-10-CM | POA: Diagnosis not present

## 2017-12-11 DIAGNOSIS — I739 Peripheral vascular disease, unspecified: Secondary | ICD-10-CM | POA: Diagnosis not present

## 2017-12-11 DIAGNOSIS — G8929 Other chronic pain: Secondary | ICD-10-CM | POA: Diagnosis not present

## 2017-12-13 ENCOUNTER — Encounter: Payer: Self-pay | Admitting: Physician Assistant

## 2017-12-18 ENCOUNTER — Telehealth: Payer: Self-pay

## 2017-12-18 NOTE — Telephone Encounter (Signed)
NOTES WAS GIVEN TO SCOTT

## 2017-12-27 ENCOUNTER — Encounter: Payer: Self-pay | Admitting: Physician Assistant

## 2017-12-27 ENCOUNTER — Telehealth: Payer: Self-pay | Admitting: Physician Assistant

## 2017-12-27 NOTE — Progress Notes (Signed)
Records received from [facility] in [place].  Patient was admitted from November 11, 2017 until November 13, 2017.  Records indicate he presented with a fall and denied loss of consciousness.  He did rule in for non-ST elevation myocardial infarction.  Peak troponin 6.15.  Cardiac catheterization demonstrated high-grade in-stent restenosis in the vein graft to the RCA which was treated with balloon angioplasty.  Echocardiogram did demonstrate reduced LV function with an EF of 20-25%.  Cardiac catheterization 11/12/2017 LVEDP 16 EF 50% LM occluded RCA occluded SVG-RCA patent with mid stent with subtotal ISR SVG-OM patent stent LIMA-LAD patent PCI: POBA to the SVG-RCA  Echo 11/13/2017 EF 20-25, diffuse HK, mild MR, normally functioning aortic valve prosthesis, mild to moderate TR  [name], PA-C    12/27/2017 5:02 PM
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
## 2017-12-27 NOTE — Telephone Encounter (Signed)
Please contact patient. I reviewed his records from his hospitalization in [place] in March 2019. The stent in one of his vein grafts had significant blockage and was treated with angioplasty. He had an echocardiogram which demonstrated worsening LV function. Continue current medications. He will need a follow-up echocardiogram 90 days post PCI. PLAN:  1.  Please arrange complete 2D echocardiogram on or after 02/12/2018. 2.  Call if weight increases by 3 pounds in 1 day or 5 pounds in 1 week 3.  Return sooner if worsening shortness of breath or recurrent chest pain. [name], PA-C    12/27/2017 5:05 PM

## 2017-12-28 ENCOUNTER — Encounter (HOSPITAL_COMMUNITY): Payer: Self-pay | Admitting: Hematology

## 2017-12-28 ENCOUNTER — Inpatient Hospital Stay (HOSPITAL_COMMUNITY): Payer: Medicare HMO | Attending: Hematology | Admitting: Hematology

## 2017-12-28 ENCOUNTER — Other Ambulatory Visit: Payer: Self-pay

## 2017-12-28 ENCOUNTER — Inpatient Hospital Stay (HOSPITAL_COMMUNITY): Payer: Medicare HMO

## 2017-12-28 DIAGNOSIS — I10 Essential (primary) hypertension: Secondary | ICD-10-CM | POA: Insufficient documentation

## 2017-12-28 DIAGNOSIS — D649 Anemia, unspecified: Secondary | ICD-10-CM | POA: Diagnosis not present

## 2017-12-28 DIAGNOSIS — R600 Localized edema: Secondary | ICD-10-CM | POA: Insufficient documentation

## 2017-12-28 DIAGNOSIS — D631 Anemia in chronic kidney disease: Secondary | ICD-10-CM | POA: Diagnosis present

## 2017-12-28 DIAGNOSIS — N189 Chronic kidney disease, unspecified: Secondary | ICD-10-CM | POA: Diagnosis present

## 2017-12-28 DIAGNOSIS — D509 Iron deficiency anemia, unspecified: Secondary | ICD-10-CM | POA: Diagnosis not present

## 2017-12-28 DIAGNOSIS — R531 Weakness: Secondary | ICD-10-CM | POA: Diagnosis not present

## 2017-12-28 DIAGNOSIS — I129 Hypertensive chronic kidney disease with stage 1 through stage 4 chronic kidney disease, or unspecified chronic kidney disease: Secondary | ICD-10-CM | POA: Insufficient documentation

## 2017-12-28 DIAGNOSIS — Z7901 Long term (current) use of anticoagulants: Secondary | ICD-10-CM | POA: Insufficient documentation

## 2017-12-28 LAB — FERRITIN: Ferritin: 47 ng/mL (ref 24–336)

## 2017-12-28 LAB — COMPREHENSIVE METABOLIC PANEL
ALBUMIN: 4.2 g/dL (ref 3.5–5.0)
ALT: 36 U/L (ref 17–63)
ANION GAP: 8 (ref 5–15)
AST: 59 U/L — ABNORMAL HIGH (ref 15–41)
Alkaline Phosphatase: 89 U/L (ref 38–126)
BUN: 30 mg/dL — ABNORMAL HIGH (ref 6–20)
CO2: 30 mmol/L (ref 22–32)
Calcium: 9.7 mg/dL (ref 8.9–10.3)
Chloride: 100 mmol/L — ABNORMAL LOW (ref 101–111)
Creatinine, Ser: 1.24 mg/dL (ref 0.61–1.24)
GFR calc Af Amer: 60 mL/min — ABNORMAL LOW (ref >60)
GFR calc non Af Amer: 52 mL/min — ABNORMAL LOW (ref >60)
GLUCOSE: 105 mg/dL — AB (ref 65–99)
POTASSIUM: 4.5 mmol/L (ref 3.5–5.1)
SODIUM: 138 mmol/L (ref 135–145)
Total Bilirubin: 0.6 mg/dL (ref 0.3–1.2)
Total Protein: 7.4 g/dL (ref 6.5–8.1)

## 2017-12-28 LAB — RETICULOCYTES
RBC.: 2.98 MIL/uL — ABNORMAL LOW (ref 4.22–5.81)
RETIC COUNT ABSOLUTE: 50.7 10*3/uL (ref 19.0–186.0)
RETIC CT PCT: 1.7 % (ref 0.4–3.1)

## 2017-12-28 LAB — CBC WITH DIFFERENTIAL/PLATELET
BASOS PCT: 0 %
Basophils Absolute: 0 10*3/uL (ref 0.0–0.1)
EOS ABS: 0.1 10*3/uL (ref 0.0–0.7)
EOS PCT: 2 %
HCT: 29 % — ABNORMAL LOW (ref 39.0–52.0)
Hemoglobin: 9.6 g/dL — ABNORMAL LOW (ref 13.0–17.0)
LYMPHS ABS: 0.9 10*3/uL (ref 0.7–4.0)
Lymphocytes Relative: 14 %
MCH: 32.2 pg (ref 26.0–34.0)
MCHC: 33.1 g/dL (ref 30.0–36.0)
MCV: 97.3 fL (ref 78.0–100.0)
MONO ABS: 0.7 10*3/uL (ref 0.1–1.0)
MONOS PCT: 10 %
NEUTROS PCT: 74 %
Neutro Abs: 4.7 10*3/uL (ref 1.7–7.7)
PLATELETS: 128 10*3/uL — AB (ref 150–400)
RBC: 2.98 MIL/uL — ABNORMAL LOW (ref 4.22–5.81)
RDW: 14.5 % (ref 11.5–15.5)
WBC: 6.4 10*3/uL (ref 4.0–10.5)

## 2017-12-28 LAB — LACTATE DEHYDROGENASE: LDH: 183 U/L (ref 98–192)

## 2017-12-28 LAB — VITAMIN B12: VITAMIN B 12: 648 pg/mL (ref 180–914)

## 2017-12-28 LAB — IRON AND TIBC
Iron: 47 ug/dL (ref 45–182)
SATURATION RATIOS: 14 % — AB (ref 17.9–39.5)
TIBC: 336 ug/dL (ref 250–450)
UIBC: 289 ug/dL

## 2017-12-28 NOTE — Telephone Encounter (Signed)
I called and lmptcb to discuss recommendations per [name], PA. PA has reviewed records from [place] and recommended that an Echo be done on or after 02/12/18 which will be 90 days from PCI. Also need to go over recommendation as to monitor weight daily and call the office wt is up 3 lb's x 1 day or 5 lb's x 1 week. Pt has appt with [name], PA 12/31/17.

## 2017-12-28 NOTE — Assessment & Plan Note (Signed)
1.  Normocytic anemia: - Recent CBC done at Dr. Ria Comment office showed a hemoglobin of 9.6 with a normal MCV, normal white count and platelet count.  Creatinine was mildly elevated at 1.2.  Denies any bleeding per rectum or melena.  He is on Plavix for many years.  Has taken iron tablet intermittently, stopped about 1 week ago.  It caused him constipation.  I have reviewed labs in our epic system.  He has some degree of anemia at least since 2013. -Last colonoscopy was in April 2013, a tubular adenoma was removed.  We will check his stool for occult blood. - It appears to be a combination anemia from iron deficiency, renal insufficiency.  We will check a ferritin, iron panel, C38, folic acid, LDH, reticulocyte count, SPEP.  We will see him back in 2 weeks for follow-up.

## 2017-12-29 LAB — HAPTOGLOBIN: HAPTOGLOBIN: 158 mg/dL (ref 34–200)

## 2017-12-29 LAB — ERYTHROPOIETIN: Erythropoietin: 32.2 m[IU]/mL — ABNORMAL HIGH (ref 2.6–18.5)

## 2017-12-29 LAB — FOLATE: Folate: 44.4 ng/mL (ref >5.9)

## 2017-12-31 ENCOUNTER — Ambulatory Visit: Payer: Medicare HMO | Admitting: Physician Assistant

## 2017-12-31 ENCOUNTER — Encounter: Payer: Self-pay | Admitting: Physician Assistant

## 2017-12-31 VITALS — BP 138/72 | HR 78 | Ht 67.5 in | Wt 189.0 lb

## 2017-12-31 DIAGNOSIS — I5042 Chronic combined systolic (congestive) and diastolic (congestive) heart failure: Secondary | ICD-10-CM

## 2017-12-31 DIAGNOSIS — Z952 Presence of prosthetic heart valve: Secondary | ICD-10-CM

## 2017-12-31 DIAGNOSIS — I25810 Atherosclerosis of coronary artery bypass graft(s) without angina pectoris: Secondary | ICD-10-CM

## 2017-12-31 DIAGNOSIS — Z950 Presence of cardiac pacemaker: Secondary | ICD-10-CM

## 2017-12-31 DIAGNOSIS — I10 Essential (primary) hypertension: Secondary | ICD-10-CM

## 2017-12-31 LAB — PROTEIN ELECTROPHORESIS, SERUM
A/G Ratio: 1.1 (ref 0.7–1.7)
Albumin ELP: 3.7 g/dL (ref 2.9–4.4)
Alpha-1-Globulin: 0.3 g/dL (ref 0.0–0.4)
Alpha-2-Globulin: 0.7 g/dL (ref 0.4–1.0)
Beta Globulin: 1 g/dL (ref 0.7–1.3)
Gamma Globulin: 1.2 g/dL (ref 0.4–1.8)
Globulin, Total: 3.3 g/dL (ref 2.2–3.9)
Total Protein ELP: 7 g/dL (ref 6.0–8.5)

## 2017-12-31 LAB — METHYLMALONIC ACID, SERUM: METHYLMALONIC ACID, QUANTITATIVE: 240 nmol/L (ref 0–378)

## 2017-12-31 NOTE — Progress Notes (Signed)
Cardiology Office Note:    Date:  12/31/2017   ID:  [name], DOB April 02, 1931, MRN [id number]  PCP:  [name], MD  Cardiologist:  [name], MD   Referring MD: [name], MD   Chief Complaint  Patient presents with  . Follow-up    CAD, CHF    History of Present Illness:    [name] is a 84 y.o. male with coronary artery disease status post CABG in 2010 and subsequent PCI with stenting of the vein graft to the OM1 in 2017, aortic stenosis status post TAVR in September 2017, complete heart block status post CRT-P ([employer] MRI compatible), combined systolic and diastolic heart failure, hypertension, hyperlipidemia.  He was admitted in December 2018 with worsening shortness of breath.  Cardiac catheterization demonstrated patent SVG-OM1 and patent LIMA-LAD.  He had severe disease in the ostial portion and distal portion of the SVG-RPDA.  Both lesions were treated with drug-eluting stents.  He was last seen April 2019 after admission to the hospital in [place] with syncope.  Since his last visit, I have reviewed those records.  He ruled in for non-STEMI and his troponin peaked at 6.15.  His echocardiogram demonstrated worsening LV function with an EF of 20-25%.  Cardiac catheterization demonstrated high-grade in-stent restenosis in the vein graft to the RCA which was treated with balloon angioplasty.  [name] returns for follow-up.  He is here with his wife.  Last week, he had 2 days where he was short of breath with just minimal activity.  However, over the last 2 days, he feels much better.  He continues to have episodes of dizziness when he stands.  He denies any syncope.  He denies chest discomfort.  His weight has been fairly stable.  He has not taken any extra diuretic.  He is now seeing hematology for chronic anemia.  He denies any bleeding issues.  Prior CV studies:   The following studies were reviewed today:  Cardiac catheterization 11/12/2017 [facility], [place], [place]) LVEDP 16 EF 50% LM occluded RCA occluded SVG-RCA patent with mid stent with subtotal ISR SVG-OM patent stent LIMA-LAD patent PCI: POBA to the SVG-RCA  Echo 11/13/2017 [facility], [place], [place]) EF 20-25, diffuse HK, mild MR, normally functioning aortic valve prosthesis, mild to moderate TR  Cardiac Catheterization 08/09/17 LM ostial 100 RCA proximal 100-CTO SVG-RPDA ostial 90, distal 90 SVG-OM1 stent patent LIMA-LAD patent EF 40-45 Mean PCWP 20; LVEDP 22 PCI: 4 x 12 mm Resolute Onyx DES to the ostial/proximal SVG-RPDA  PCI:  3 x 22 mm Resolute Onyx DES to the distal SVG-RPDA Recommend: Continue clopidogrel without interruption, add aspirin 81 mg daily, diurese overnight with a single dose of IV Lasix and then continue oral furosemide. If no complications arise should be okay for discharge tomorrow.  Carotid US 05/14/17 L CEA patent; RICA 40-59  Echo 05/09/17 Inferior akinesis, mild LVH, EF 40, status post TAVR with no perivalvular regurgitation, MAC, mild MR, moderate LAE, mild RAE, PASP 36  Echo 06/22/16 Mild LVH, EF 40-45, diffuse HK, grade 2 diastolic dysfunction, status post TAVR with no significant stenosis or perivalvular regurgitation, MAC, trivial MR, mild LAE, mild RAE, PASP 39  Past Medical History:  Diagnosis Date  . Arthritis   . Benign prostatic hypertrophy   . CAD (coronary artery disease)    DES, SVG to circumflex marginal, October, 2010 ( SVG to PDA and PLA patent , LIMA to LAD patent,  EF 60%, mild inferior hypokinesis 12/18 PCI/DES to SVG-PDA x2, EF 40% // NSTEMI 3/19 [facility]) >> S-OM and L-LAD ok; S-RCA stent sub-total ISR >> PCI:  POBA  . Chronic systolic CHF (congestive heart failure) ([place])    Echo 9/18: Inferior akinesis, mild LVH, EF 40, status post TAVR with no perivalvular regurgitation, MAC, mild MR, moderate LAE, mild RAE, PASP 36 // Echo 3/19: EF 20-25, mild MR, AVR ok, mild to mod TR  . Complete  heart block ([place])    a. s/p MDT CRTP 04/2016 Dr [name]  . GERD (gastroesophageal reflux disease)   . History of kidney stones 1970   /notes 01/03/2011  . HTN (hypertension)   . Hyperlipidemia   . Hypothyroidism   . Neuromuscular disorder ([place])   . Peripheral neuropathy   . Psoriasis    severe; with total skin exfoliation in the past resolved  . S/P TAVR (transcatheter aortic valve replacement)    severe AS >> s/p TAVR 9/17 // Limited Echo 10/17: EF 40-45%, lat and inf-lat HK, TAVR ok, MAC, mild BAE, PASP 31 mmHg, no effusion  . Ventricular tachycardia, non-sustained (HCC)    Surgical Hx: The patient  has a past surgical history that includes decompression of left median nerve; Colonoscopy (11/23/2011); Tonsillectomy; Appendectomy; Total knee arthroplasty (04/16/2012); Cataract extraction, bilateral; Carpal tunnel release (Bilateral, 2009-2011); left heart catheterization with coronary angiogram (N/A, 12/27/2012); TEE without cardioversion (N/A, 04/21/2016); Cardiac catheterization (N/A, 04/27/2016); Transcatheter aortic valve replacement, transfemoral (N/A, 05/16/2016); TEE without cardioversion (N/A, 05/16/2016); Cardiac catheterization (N/A, 11/17/2015); Cardiac catheterization (N/A, 11/17/2015); Cardiac catheterization (N/A, 04/19/2016); Coronary angioplasty with stent; Coronary angioplasty with stent (08/09/2017); Coronary angioplasty with stent (11/17/2015); Lumbar laminectomy (Bilateral, 09/2004); Carotid endarterectomy (Left, 02/2008); Synovial cyst excision (09/2004); Back surgery; Joint replacement; Lumbar disc surgery; Anterior cervical decomp/discectomy fusion; Kidney stone surgery; Coronary artery bypass graft (1995); RIGHT/LEFT HEART CATH AND CORONARY/GRAFT ANGIOGRAPHY (N/A, 08/09/2017); and CORONARY STENT INTERVENTION (N/A, 08/09/2017).   Current Medications: Current Meds  Medication Sig  . allopurinol (ZYLOPRIM) 300 MG tablet Take 600 mg by mouth daily.   Marland Kitchen amoxicillin (AMOXIL) 500 MG tablet  Take 4 tablets (2,000 mg total) by mouth as needed. Take 4 of the 500 mg tablets = 2000 mg 60 minutes before dental procedure  . aspirin 81 MG chewable tablet Chew 1 tablet (81 mg total) by mouth daily.  Marland Kitchen atorvastatin (LIPITOR) 20 MG tablet Take 20 mg by mouth every evening.   . clopidogrel (PLAVIX) 75 MG tablet Take 1 tablet (75 mg total) by mouth daily with breakfast.  . Docusate Sodium (COLACE PO) Take 2 tablets by mouth daily as needed (constipation).   . fluticasone (FLONASE) 50 MCG/ACT nasal spray Place 1 spray into both nostrils 2 (two) times daily.  Marland Kitchen latanoprost (XALATAN) 0.005 % ophthalmic solution Place 1 drop into both eyes at bedtime.  Marland Kitchen levothyroxine (SYNTHROID, LEVOTHROID) 112 MCG tablet Take 112 mcg by mouth daily before breakfast.  . losartan (COZAAR) 50 MG tablet Take 50 mg by mouth daily.  . Magnesium 250 MG TABS Take 1 tablet by mouth 2 (two) times daily.  . Methylcellulose, Laxative, (CITRUCEL PO) Take by mouth as directed. Take 1 tablespoon at night  . metoprolol tartrate (LOPRESSOR) 25 MG tablet TAKE HALF TABLET BY MOUTH TWICE DAILY  . multivitamin (THERAGRAN) per tablet Take 1 tablet by mouth at bedtime.   . nitroGLYCERIN (NITROSTAT) 0.4 MG SL tablet Place 0.4 mg under the tongue every 5 (five) minutes x 3 doses as needed. For chest  pain  . potassium chloride SA (K-DUR,KLOR-CON) 20 MEQ tablet Take 1.5 tablets (30 mEq total) by mouth daily.  Marland Kitchen pyridOXINE (VITAMIN B-6) 100 MG tablet Take 100 mg by mouth daily.   . Tamsulosin HCl (FLOMAX) 0.4 MG CAPS Take 0.4 mg by mouth daily.   . [DISCONTINUED] Ferrous Sulfate (IRON) 325 (65 Fe) MG TABS Take 1 tablet by mouth 2 (two) times daily.     Allergies:   Other and Propylene glycol   Social History   Tobacco Use  . Smoking status: Never Smoker  . Smokeless tobacco: Never Used  Substance Use Topics  . Alcohol use: Yes    Alcohol/week: 0.6 oz    Types: 1 Cans of beer per week  . Drug use: No     Family Hx: The  patient's family history includes Heart attack in his mother; Heart attack (age of onset: 78) in his son; Heart disease in his father and mother; Hyperlipidemia in his father; Hypertension in his father. There is no history of Colon cancer.  ROS:   Please see the history of present illness.    Review of Systems  Constitution: Positive for malaise/fatigue.  Cardiovascular: Positive for dyspnea on exertion and leg swelling.  Hematologic/Lymphatic: Bruises/bleeds easily.  Neurological: Positive for loss of balance.   All other systems reviewed and are negative.   EKGs/Labs/Other Test Reviewed:    EKG:  EKG is  ordered today.  The ekg ordered today demonstrates normal sinus rhythm, ventricular paced, HR 73, PVCs  Recent Labs: 01/24/2017: Magnesium 2.3 08/07/2017: NT-Pro BNP 2,488 12/28/2017: ALT 36; BUN 30; Creatinine, Ser 1.24; Hemoglobin 9.6; Platelets 128; Potassium 4.5; Sodium 138   Recent Lipid Panel Lab Results  Component Value Date/Time   CHOL 143 08/06/2006 08:14 AM   TRIG 141 08/06/2006 08:14 AM   HDL 29.7 (L) 08/06/2006 08:14 AM   CHOLHDL 4.8 CALC 08/06/2006 08:14 AM   LDLCALC 85 08/06/2006 08:14 AM    Physical Exam:    VS:  BP 138/72   Pulse 78   Ht 5' 7.5" (1.715 m)   Wt 189 lb (85.7 kg)   SpO2 98%   BMI 29.16 kg/m     Orthostatic VS for the past 24 hrs (Last 3 readings):  BP- Lying BP- Standing at 0 minutes  12/31/17 1251 138/72 138/71    Wt Readings from Last 3 Encounters:  12/31/17 189 lb (85.7 kg)  12/28/17 189 lb 3.2 oz (85.8 kg)  11/30/17 187 lb (84.8 kg)      Physical Exam  Constitutional: He is oriented to person, place, and time. He appears well-developed and well-nourished. No distress.  HENT:  Head: Normocephalic and atraumatic.  Neck: Neck supple. No JVD present.  Cardiovascular: Normal rate, regular rhythm, S1 normal and S2 normal.  No murmur heard. Pulmonary/Chest: Breath sounds normal. He has no rales.  Abdominal: Soft. There is no  hepatomegaly.  Musculoskeletal: He exhibits edema (trace bilat edema).  Neurological: He is alert and oriented to person, place, and time.  Skin: Skin is warm and dry.    ASSESSMENT & PLAN:    Chronic combined systolic and diastolic CHF (congestive heart failure) (HCC) EF 20-25 by echocardiogram done while hospitalized in [place] in March 2019.  His heart failure medications have been reduced in the past secondary to hypotension and orthostatic intolerance.  He underwent PCI at the time of his admission to the hospital when his EF was documented at 20-25%.  He has noted increased shortness of breath  recently.  However this resolved.  Volume appears stable at this time.    -Continue current therapy with beta-blocker, ARB.    -Continue current dose of furosemide.    -I will see if we can follow him in the [facility] clinic.  -Arrange follow-up echo in June 2019  Coronary artery disease involving coronary bypass graft of native heart without angina pectoris Prior history of CABG and subsequent PCI of the SVG-OM1, drug-eluting stent x2 to the SVG-RCA in 12/18 and recent non-STEMI while in [place] in 3/19 2/2 in-stent restenosis of the S-RCA treated with POBA.  He did have recent shortness of breath.  This is resolved.  His symptoms of angina in the past have been shortness of breath.  I reviewed his case today with Dr. Burt Knack.  At this point, we plan to continue medical therapy.  Stress testing will not be helpful.  We would like to avoid proceeding with repeat cardiac catheterization if at all possible.  Options for treatment (ie repeat PCI) are somewhat limited.  Given his tenuous blood pressure in the past, I will not add isosorbide.  We could consider ranolazine if he continues to have symptoms.  Essential hypertension The patient's blood pressure is controlled on his current regimen.  Continue current therapy.  Orthostatic vital signs today did not demonstrate significant blood pressure drop from lying  to standing.  I have asked him to wear compression stockings if possible to avoid orthostatic intolerance.    Severe Aortic Stenosis S/P TAVR (transcatheter aortic valve replacement) Aortic valve prosthesis functioning normally by echocardiogram March 2019 done while hospitalized in [place].  Continue SBE prophylaxis.  S/P biventricular cardiac pacemaker procedure As noted, I will see if he can be followed in the [facility] clinic.  Continue follow-up with EP as planned.    Dispo:  Return in about 3 months (around 04/02/2018) for Routine Follow Up, w/ Dr. Burt Knack.   Medication Adjustments/Labs and Tests Ordered: Current medicines are reviewed at length with the patient today.  Concerns regarding medicines are outlined above.  Tests Ordered: Orders Placed This Encounter  Procedures  . EKG 12-Lead  . ECHOCARDIOGRAM COMPLETE   Medication Changes: No orders of the defined types were placed in this encounter.   Signed, [name], PA-C  12/31/2017 4:16 PM    [facility] [address], [place], [place]  [address] Phone: [phone]; Fax: [phone]

## 2017-12-31 NOTE — Telephone Encounter (Signed)
See OV note from today. [name], PA-C    12/31/2017 4:59 PM

## 2017-12-31 NOTE — Patient Instructions (Signed)
Medication Instructions:  1. Your physician recommends that you continue on your current medications as directed. Please refer to the Current Medication list given to you today.    Labwork: NONE ORDERED TODAY  Testing/Procedures: 1. Your physician has requested that you have an echocardiogram. Echocardiography is a painless test that uses sound waves to create images of your heart. It provides your doctor with information about the size and shape of your heart and how well your heart's chambers and valves are working. This procedure takes approximately one hour. There are no restrictions for this procedure. THIS IS TO BE DONE ON OR AFTER 02/12/18    Follow-Up: DR. Burt Knack IN 3-4 MONTHS   Any Other Special Instructions Will Be Listed Below (If Applicable). PER [employer], PAC PICK UP SOME OTC COMPRESSION STOCKINGS; WEAR FROM THE TIME YOU WAKE UP UNTIL THE TIME YOU GO TO BED    If you need a refill on your cardiac medications before your next appointment, please call your pharmacy.

## 2018-01-01 ENCOUNTER — Other Ambulatory Visit (HOSPITAL_COMMUNITY): Payer: Self-pay | Admitting: *Deleted

## 2018-01-01 DIAGNOSIS — R531 Weakness: Secondary | ICD-10-CM | POA: Diagnosis not present

## 2018-01-01 DIAGNOSIS — R600 Localized edema: Secondary | ICD-10-CM | POA: Diagnosis not present

## 2018-01-01 DIAGNOSIS — D509 Iron deficiency anemia, unspecified: Secondary | ICD-10-CM | POA: Diagnosis not present

## 2018-01-01 DIAGNOSIS — I10 Essential (primary) hypertension: Secondary | ICD-10-CM | POA: Diagnosis not present

## 2018-01-01 DIAGNOSIS — N189 Chronic kidney disease, unspecified: Secondary | ICD-10-CM | POA: Diagnosis not present

## 2018-01-01 DIAGNOSIS — D649 Anemia, unspecified: Secondary | ICD-10-CM

## 2018-01-01 DIAGNOSIS — Z7901 Long term (current) use of anticoagulants: Secondary | ICD-10-CM | POA: Diagnosis not present

## 2018-01-01 DIAGNOSIS — D631 Anemia in chronic kidney disease: Secondary | ICD-10-CM | POA: Diagnosis not present

## 2018-01-01 DIAGNOSIS — I129 Hypertensive chronic kidney disease with stage 1 through stage 4 chronic kidney disease, or unspecified chronic kidney disease: Secondary | ICD-10-CM | POA: Diagnosis not present

## 2018-01-01 LAB — OCCULT BLOOD X 1 CARD TO LAB, STOOL
FECAL OCCULT BLD: NEGATIVE
Fecal Occult Bld: NEGATIVE
Fecal Occult Bld: NEGATIVE

## 2018-01-03 ENCOUNTER — Ambulatory Visit (INDEPENDENT_AMBULATORY_CARE_PROVIDER_SITE_OTHER): Payer: Medicare HMO

## 2018-01-03 ENCOUNTER — Encounter (INDEPENDENT_AMBULATORY_CARE_PROVIDER_SITE_OTHER): Payer: Self-pay | Admitting: Orthopaedic Surgery

## 2018-01-03 ENCOUNTER — Ambulatory Visit (INDEPENDENT_AMBULATORY_CARE_PROVIDER_SITE_OTHER): Payer: Medicare HMO | Admitting: Orthopaedic Surgery

## 2018-01-03 ENCOUNTER — Telehealth: Payer: Self-pay | Admitting: Physician Assistant

## 2018-01-03 VITALS — BP 121/60 | HR 73 | Resp 20 | Ht 67.5 in | Wt 188.0 lb

## 2018-01-03 DIAGNOSIS — G8929 Other chronic pain: Secondary | ICD-10-CM | POA: Diagnosis not present

## 2018-01-03 DIAGNOSIS — M25561 Pain in right knee: Secondary | ICD-10-CM | POA: Diagnosis not present

## 2018-01-03 MED ORDER — LOSARTAN POTASSIUM 25 MG PO TABS: 25.0000 mg | ORAL_TABLET | Freq: Every day | ORAL | 3 refills | Status: DC

## 2018-01-03 NOTE — Telephone Encounter (Signed)
Pt states he passed out again yesterday and busted his knee.  Seen by ortho today.  Vitals at ortho were 121/60, HR 73.  Pt states this episode was just like all of his previous episodes of syncope.  Pt became weak and dizzy, thought he was going to be ok, and then passed out.  Pt states when he seen [name], PA-C they discussed possible med changes but then decided against it.  Pt now wanting to move forward with recommendations if Nicki Reaper feels they need to be done.  Pt denies any other injuries from syncopal episode.  Advised I will send message to [name], PA-C for review and advisement.
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
## 2018-01-03 NOTE — Telephone Encounter (Signed)
Decrease Losartan to 25 mg QD. Have device clinic interrogate his device to see if he had any arrhythmias at the time he passed out. Route phone note back to me with device interrogation results. He should not drive for now. Wear compression stockings. [name], PA-C    01/03/2018 5:01 PM

## 2018-01-03 NOTE — Telephone Encounter (Signed)
Spoke with pt and went over recommendations per [name], PA-C. Forgot to mention in original note that pt was instructed to send in a transmission.  He is not home right now but will send in transmission as soon as he gets in this evening.  Reminded pt to do so.  Pt purchased compression stockings but is not currently wearing them d/t swelling in his knee from his fall yesterday.  Pt agreeable to wear them though.  Will route to device team to f/u on transmission tomorrow.  Pt stated his syncopal episode was around 2pm yesterday.

## 2018-01-03 NOTE — Telephone Encounter (Signed)
New Message:      Pt is calling to inquire about a change of medication like they discuss previously. Pt would like to talk with Kathlen Mody he states.

## 2018-01-04 ENCOUNTER — Telehealth: Payer: Self-pay | Admitting: Physician Assistant

## 2018-01-04 LAB — CUP PACEART REMOTE DEVICE CHECK
Battery Remaining Longevity: 87 mo
Battery Voltage: 2.98 V
Brady Statistic RA Percent Paced: 4.99 %
Brady Statistic RV Percent Paced: 97.42 %
Date Time Interrogation Session: 20190411054544
Implantable Lead Implant Date: 20170907
Implantable Lead Implant Date: 20170907
Implantable Lead Location: 753858
Implantable Lead Model: 4598
Implantable Pulse Generator Implant Date: 20170907
Lead Channel Impedance Value: 266 Ohm
Lead Channel Impedance Value: 285 Ohm
Lead Channel Impedance Value: 304 Ohm
Lead Channel Impedance Value: 361 Ohm
Lead Channel Impedance Value: 361 Ohm
Lead Channel Impedance Value: 399 Ohm
Lead Channel Impedance Value: 608 Ohm
Lead Channel Impedance Value: 646 Ohm
Lead Channel Impedance Value: 684 Ohm
Lead Channel Sensing Intrinsic Amplitude: 9.625 mV
Lead Channel Setting Pacing Amplitude: 2 V
Lead Channel Setting Pacing Amplitude: 2 V
Lead Channel Setting Pacing Pulse Width: 0.8 ms
MDC IDC LEAD IMPLANT DT: 20170907
MDC IDC LEAD LOCATION: 753859
MDC IDC LEAD LOCATION: 753860
MDC IDC MSMT LEADCHNL LV IMPEDANCE VALUE: 342 Ohm
MDC IDC MSMT LEADCHNL LV IMPEDANCE VALUE: 399 Ohm
MDC IDC MSMT LEADCHNL LV IMPEDANCE VALUE: 418 Ohm
MDC IDC MSMT LEADCHNL LV IMPEDANCE VALUE: 532 Ohm
MDC IDC MSMT LEADCHNL LV IMPEDANCE VALUE: 551 Ohm
MDC IDC MSMT LEADCHNL LV PACING THRESHOLD AMPLITUDE: 1 V
MDC IDC MSMT LEADCHNL LV PACING THRESHOLD PULSEWIDTH: 0.8 ms
MDC IDC MSMT LEADCHNL RA PACING THRESHOLD AMPLITUDE: 0.625 V
MDC IDC MSMT LEADCHNL RA PACING THRESHOLD PULSEWIDTH: 0.4 ms
MDC IDC MSMT LEADCHNL RA SENSING INTR AMPL: 2.5 mV
MDC IDC MSMT LEADCHNL RA SENSING INTR AMPL: 2.5 mV
MDC IDC MSMT LEADCHNL RV PACING THRESHOLD AMPLITUDE: 1.25 V
MDC IDC MSMT LEADCHNL RV PACING THRESHOLD PULSEWIDTH: 0.4 ms
MDC IDC MSMT LEADCHNL RV SENSING INTR AMPL: 9.625 mV
MDC IDC SET LEADCHNL RV PACING AMPLITUDE: 2.75 V
MDC IDC SET LEADCHNL RV PACING PULSEWIDTH: 0.4 ms
MDC IDC SET LEADCHNL RV SENSING SENSITIVITY: 0.9 mV
MDC IDC STAT BRADY AP VP PERCENT: 4.27 %
MDC IDC STAT BRADY AP VS PERCENT: 0.02 %
MDC IDC STAT BRADY AS VP PERCENT: 93.16 %
MDC IDC STAT BRADY AS VS PERCENT: 2.55 %

## 2018-01-04 NOTE — Telephone Encounter (Signed)
Ptcb and has been scheduled to see Brynda Rim. PA 01/15/18, pt needed an afternoon appt.

## 2018-01-04 NOTE — Telephone Encounter (Signed)
Arrange follow up in the next 2 weeks to see Dr. Burt Knack or me on a day Dr. Burt Knack is in the office. He will need orthostatic VS done when he comes in. [name], PA-C    01/04/2018 7:57 AM

## 2018-01-04 NOTE — Telephone Encounter (Signed)
New Message: ° ° ° ° ° ° °Pt is returning a call °

## 2018-01-04 NOTE — Telephone Encounter (Signed)
See previous phone note from earlier today

## 2018-01-04 NOTE — Telephone Encounter (Signed)
Transmission received 01/03/18 1808.  No episodes. Presenting rhythm: Atrial pacing, BiV pacing. Thoracic impedance at baseline. Histograms appropriate for patient activity level. Normal device function.

## 2018-01-04 NOTE — Telephone Encounter (Signed)
I lmptcb to schedule appt per Brynda Rim. PA   PT NEEDS TO SEE SCOTT W. PAC IN 2 WEEKS SAME DAY DR. Burt Knack IS IN THE OFFICE PLEASE .

## 2018-01-04 NOTE — Telephone Encounter (Signed)
Unfortunately we were not able to coincide same day Dr. Burt Knack is in the office.

## 2018-01-04 NOTE — Telephone Encounter (Signed)
Pt called back though I was not available at the time. I have now called pt back and lmom he needs to schedule appt to see Brynda Rim. PA in 2 weeks same day Dr. Burt Knack is in the office.

## 2018-01-07 ENCOUNTER — Telehealth: Payer: Self-pay

## 2018-01-07 NOTE — Telephone Encounter (Signed)
Patient referred to [facility] by [name], PA and asked for remote transmission to be checked.  Attempted call to patient for ICM intro.  Patient picked up and attempted to discuss remote monitoring but he then disconnected the call.  He was having difficulty hearing.  Attempted call back and voice mail message was left to return call.  Remote transmission scheduled for 01/10/2018.

## 2018-01-08 ENCOUNTER — Ambulatory Visit (INDEPENDENT_AMBULATORY_CARE_PROVIDER_SITE_OTHER): Payer: Medicare HMO | Admitting: Orthopaedic Surgery

## 2018-01-08 ENCOUNTER — Encounter (INDEPENDENT_AMBULATORY_CARE_PROVIDER_SITE_OTHER): Payer: Self-pay | Admitting: Orthopaedic Surgery

## 2018-01-08 DIAGNOSIS — M25561 Pain in right knee: Secondary | ICD-10-CM | POA: Diagnosis not present
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
## 2018-01-10 ENCOUNTER — Ambulatory Visit (INDEPENDENT_AMBULATORY_CARE_PROVIDER_SITE_OTHER): Payer: Medicare HMO

## 2018-01-10 ENCOUNTER — Telehealth: Payer: Self-pay

## 2018-01-10 DIAGNOSIS — I5042 Chronic combined systolic (congestive) and diastolic (congestive) heart failure: Secondary | ICD-10-CM | POA: Diagnosis not present

## 2018-01-10 DIAGNOSIS — Z950 Presence of cardiac pacemaker: Secondary | ICD-10-CM | POA: Diagnosis not present

## 2018-01-10 NOTE — Progress Notes (Signed)
EPIC Encounter for ICM Monitoring  Patient Name: [name] is a 84 y.o. male Date: 01/10/2018 Primary Care Physican: [name], MD Primary Cardiologist: Cooper/Weaver, PA Electrophysiologist: Allred Dry Weight: 189 lbs at 5/13 office weight Bi-V Pacing:   93.9%       1st ICM remote. Attempted call to patient and unable to reach.  Transmission reviewed.    Thoracic impedance abnormal suggesting fluid accumulation since 12/31/2017 but is trending close to baseline now.  Prescribed dosage: Furosemide 40 mg Take 1.5 tablets (60 mg total) by mouth daily.  Recommendations: NONE - Unable to reach.  Follow-up plan: ICM clinic phone appointment on 01/24/2018.  Office appointment scheduled 01/15/2018 with [name], PA.  Copy of ICM check sent to Dr. Burt Knack, [name] and Dr. [name].   3 month ICM trend: 01/10/2018    1 Year ICM trend:       Rosalene Billings, RN 01/10/2018 5:08 PM

## 2018-01-10 NOTE — Telephone Encounter (Signed)
Attempted ICM call to patient left voice mail message will call back tomorrow.

## 2018-01-11 ENCOUNTER — Ambulatory Visit (INDEPENDENT_AMBULATORY_CARE_PROVIDER_SITE_OTHER): Payer: Medicare HMO | Admitting: Orthopaedic Surgery

## 2018-01-11 ENCOUNTER — Encounter (INDEPENDENT_AMBULATORY_CARE_PROVIDER_SITE_OTHER): Payer: Self-pay | Admitting: Orthopaedic Surgery

## 2018-01-11 VITALS — BP 126/69 | HR 85 | Temp 97.2°F | Resp 18 | Ht 68.0 in | Wt 190.0 lb

## 2018-01-11 DIAGNOSIS — M79604 Pain in right leg: Secondary | ICD-10-CM | POA: Diagnosis not present

## 2018-01-11 NOTE — Progress Notes (Signed)
Attempted 2nd call for ICM and unable to reach.

## 2018-01-11 NOTE — Progress Notes (Signed)
Spoke with patient. Explained reviewed transmission and shows he has some fluid. He reported feeling fine except for his BP drops some. Advised Nicki Reaper will be checking his BP again in the office on Tuesday.  He is going to bring his cuff to see if it is accurate. Transmission reviewed.  Advised of [employer] recommendation to increase Lasix to 80 mg x 1 day, then resume 60 mg QD. He repeated instructions back correctly.  Advised will recheck transmission on 01/24/2018.
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
## 2018-01-11 NOTE — Progress Notes (Signed)
If able to reach by phone, have him increase Lasix to 80 mg x 1 day, then resume 60 mg QD. [name], PA-C    01/11/2018 1:29 PM

## 2018-01-15 ENCOUNTER — Ambulatory Visit: Payer: Medicare HMO | Admitting: Physician Assistant

## 2018-01-15 ENCOUNTER — Encounter (INDEPENDENT_AMBULATORY_CARE_PROVIDER_SITE_OTHER): Payer: Self-pay

## 2018-01-15 ENCOUNTER — Ambulatory Visit (INDEPENDENT_AMBULATORY_CARE_PROVIDER_SITE_OTHER): Payer: Self-pay

## 2018-01-15 ENCOUNTER — Encounter: Payer: Self-pay | Admitting: Physician Assistant

## 2018-01-15 ENCOUNTER — Telehealth: Payer: Self-pay | Admitting: Physician Assistant

## 2018-01-15 VITALS — BP 119/64 | HR 67 | Ht 68.0 in | Wt 191.7 lb

## 2018-01-15 DIAGNOSIS — I5042 Chronic combined systolic (congestive) and diastolic (congestive) heart failure: Secondary | ICD-10-CM | POA: Diagnosis not present

## 2018-01-15 DIAGNOSIS — Z950 Presence of cardiac pacemaker: Secondary | ICD-10-CM

## 2018-01-15 DIAGNOSIS — G629 Polyneuropathy, unspecified: Secondary | ICD-10-CM | POA: Diagnosis not present

## 2018-01-15 DIAGNOSIS — R55 Syncope and collapse: Secondary | ICD-10-CM | POA: Diagnosis not present

## 2018-01-15 DIAGNOSIS — I25810 Atherosclerosis of coronary artery bypass graft(s) without angina pectoris: Secondary | ICD-10-CM | POA: Diagnosis not present

## 2018-01-15 DIAGNOSIS — Z952 Presence of prosthetic heart valve: Secondary | ICD-10-CM

## 2018-01-15 DIAGNOSIS — M7989 Other specified soft tissue disorders: Secondary | ICD-10-CM | POA: Diagnosis not present

## 2018-01-15 DIAGNOSIS — R69 Illness, unspecified: Secondary | ICD-10-CM | POA: Diagnosis not present

## 2018-01-15 MED ORDER — METOPROLOL TARTRATE 25 MG PO TABS: 12.5000 mg | ORAL_TABLET | Freq: Every evening | ORAL | 3 refills | Status: DC

## 2018-01-15 MED ORDER — LOSARTAN POTASSIUM 25 MG PO TABS: 25.0000 mg | ORAL_TABLET | Freq: Every day | ORAL | Status: DC

## 2018-01-15 NOTE — Telephone Encounter (Signed)
Per PA request pt called back and advised our office that he has been taking Metoprolol Tartrate 12.5 mg every morning. Advised pt per PA recommendations today to try taking the Metoprolol Tartrate 12.5 mg in the evening. Advised pt that I will d/w PA and call back if there are any new recommendations. Pt thanked me for our help today. Pt is agreeable to take Metoprolol Tart 12.5 mg every evening.

## 2018-01-15 NOTE — Patient Instructions (Addendum)
Medication Instructions:  1. Stop taking Losartan (Cozaar) for now.  2. Please check your Metoprolol when you get home       - Call us tomorrow and let us know if you are taking Metoprolol Succinate or Metoprolol Tartrate. Change your Metoprolol to take it in the evening.  3. Increase Lasix (Furosemide) to 80 mg Once daily (take 2 of the 40 mg tablets) for 3 days, then resume Lasix 60 mg Once daily   Labwork: None   Testing/Procedures: .Your physician has requested that you have a lower extremity venous duplex. This test is an ultrasound of the veins in the legs or arms. It looks at venous blood flow that carries blood from the heart to the legs or arms. Allow one hour for a Lower Venous exam. Allow thirty minutes for an Upper Venous exam. There are no restrictions or special instructions.     Right Leg ultrasound - We will schedule this for tomorrow; DX LEG EDEMA; R/O DVT    Follow-Up: [name], MD or [name], PA-C in 2 weeks.  Any Other Special Instructions Will Be Listed Below (If Applicable). Try to wear compression stocking on the Left leg for now. When your Right leg is better, you can wear compression stockings on both legs. Remember to get up slowly. Make sure you have something to hold on to when you stand up.  Have something to sit on if you get dizzy. Pump your calf muscles before standing.  If you need a refill on your cardiac medications before your next appointment, please call your pharmacy.

## 2018-01-15 NOTE — Progress Notes (Signed)
I saw him today and adjusted his Lasix. Follow up as planned. [name], PA-C    01/15/2018 4:56 PM

## 2018-01-15 NOTE — Addendum Note (Signed)
Addended by: Michae Kava on: 01/15/2018 06:06 PM   Modules accepted: Orders

## 2018-01-15 NOTE — Telephone Encounter (Signed)
New message ° ° ° ° °Returning a call to the nurse °

## 2018-01-15 NOTE — Progress Notes (Signed)
EPIC Encounter for ICM Monitoring  Patient Name: [name] is a 84 y.o. male Date: 01/15/2018 Primary Care Physican: [name], MD Primary Cardiologist: Cooper/Weaver, PA Electrophysiologist: Allred Dry Weight:     189 lbs at 5/13 office weight Bi-V Pacing:   93.9%           No call to patient.  Remote transmission checked to send to [name], [place] for review at office appointment today.    Thoracic impedance remains abnormal suggesting fluid accumulation after increasing Lasix to 80 mg x 1 day as ordered by [name], PA.  Prescribed dosage: Furosemide 40 mg Take 1.5 tablets (60 mg total) by mouth daily.  LABS: 12/28/2017 Creatinine 1.24, BUN 30, Potassium 4.5, Sodium 138, EGFR 52-60 08/10/2017 Creatinine 1.08, BUN 18, Potassium 3.5, Sodium 135, EGFR >60  Recommendations: Recommendations will be given at office visit today if needed.   Follow-up plan: ICM clinic phone appointment on 01/24/2018.  Office appointment scheduled 01/15/2018 with [name], PA.  Copy of ICM check sent to Dr. Burt Knack, [name] and Dr. [name].   3 month ICM trend: 01/15/2018    1 Year ICM trend:       Rosalene Billings, RN 01/15/2018 8:11 AM

## 2018-01-15 NOTE — Progress Notes (Signed)
Per PA request pt called back and advised our office that he has been taking Metoprolol Tartrate 12.5 mg every morning. Advised pt per PA recommendations today to try taking the Metoprolol Tartrate 12.5 mg in the evening. Advised pt that I will d/w PA and call back if there are any new recommendations. Pt thanked me for our help today. Pt is agreeable to take Metoprolol Tart 12.5 mg every evening.

## 2018-01-15 NOTE — Progress Notes (Signed)
Cardiology Office Note:    Date:  01/15/2018   ID:  [name], DOB [birth year], MRN [id number]  PCP:  [name], MD  Cardiologist:  [name], MD   Referring MD: [name], MD   Chief Complaint  Patient presents with  . Loss of Consciousness  . Shortness of Breath    History of Present Illness:    [name] is a 84 y.o. male with coronary artery disease status post CABGin 2010and subsequent PCI with stenting of the vein graft to the OM1in 2017, aortic stenosis status post TAVR in September 2017, complete heart block status postCRT-P (Medtronic MRI compatible), combined systolic and diastolic heart failure, hypertension, hyperlipidemia.  [name] was admitted in December 2018 with worsening shortness of breath. Cardiac catheterization demonstrated patent SVG-OM1 and patent LIMA-LAD. [name] had severe disease in the ostial portion and distal portion of the SVG-RPDA. Both lesions were treated with drug-eluting stents.  [name] was admitted to the hospital in [place], [place] with a non-STEMI in 10/2017.  His LVF was worse on Echo with an EF of 20-25%.  Cardiac catheterization demonstrated high-grade in-stent restenosis in the vein graft to the RCA which was treated with balloon angioplasty.  [name] was last seen 12/31/17.  [name] noted worsening shortness of breath and I discussed this with Dr. Burt Knack.  Since options for managing his CAD are limited, we recommended continued medical therapy.  I did enroll him in the [facility].  His thoracic impedance has recently been somewhat abnormal suggesting fluid accumulation.  We had him take an extra dose of Lasix last week.  [name] did call in recently with recurrent syncope with resulting knee injury.  His device was interrogated and did not demonstrate any significant arrhythmias.    [name] returns for further evaluation of syncope.  [name] is here with his wife.  [name] fractured his patella on the R when [name] passed out 2 weeks ago.  His leg has been  significantly swollen and painful since.  The swelling is better.  His syncopal episode occurred while standing trying to get his boat ready to go fishing.  [name] did feel weak before falling.  [name] had another syncopal episode today leaving another provider's office.  [name] felt weak and his family helped him to the ground.  [name] feels ok when [name] is seated.  [name] has checked his blood pressure at home when something like this happens and his machine often gives him an error at first, then returns a reading in the 27P systolic.  [name] has a hx of neuropathy that has gotten worse over the years.  [name] denies chest pain. [name] feels his breathing is more short with activity.  [name] denies orthopnea, paroxysmal nocturnal dyspnea.  [name] has had a cough.  Of note, [name] has been evaluated by hematology recently for anemia.  [name] has follow up pending.    Prior CV studies:   The following studies were reviewed today:  Cardiac catheterization 11/12/2017 [facility], [place], [place]) LVEDP 16 EF 50% LM occluded RCA occluded SVG-RCA patent with mid stent with subtotal ISR SVG-OM patent stent LIMA-LAD patent PCI: POBA to the SVG-RCA  Echo 11/13/2017 [facility], [place], [place]) EF 20-25, diffuse HK, mild MR, normally functioning aortic valve prosthesis, mild to moderate TR  Cardiac Catheterization12/20/18 LM ostial 100 RCA proximal 100-CTO SVG-RPDA ostial 90, distal 90 SVG-OM1 stent patent LIMA-LAD patent EF 40-45 Mean PCWP 20; LVEDP 22 PCI: 4 x 12  mm Resolute Onyx DES to the ostial/proximal SVG-RPDA  PCI: 3 x 22 mm Resolute Onyx DES to the distal SVG-RPDA Recommend: Continue clopidogrel without interruption, add aspirin 81 mg daily, diurese overnight with a single dose of IV Lasix and then continue oral furosemide. If no complications arise should be okay for discharge tomorrow.  Carotid US 05/14/17 L CEA patent; RICA 40-59  Echo 05/09/17 Inferior akinesis, mild LVH, EF 40, status  post TAVR with no perivalvular regurgitation, MAC, mild MR, moderate LAE, mild RAE, PASP 36  Echo 06/22/16 Mild LVH, EF 40-45, diffuse HK, grade 2 diastolic dysfunction, status post TAVR with no significant stenosis or perivalvular regurgitation, MAC, trivial MR, mild LAE, mild RAE, PASP 39  Past Medical History:  Diagnosis Date  . Arthritis   . Benign prostatic hypertrophy   . CAD (coronary artery disease)    DES, SVG to circumflex marginal, October, 2010 ( SVG to PDA and PLA patent , LIMA to LAD patent, EF 60%, mild inferior hypokinesis 12/18 PCI/DES to SVG-PDA x2, EF 40% // NSTEMI 3/19 [facility]) >> S-OM and L-LAD ok; S-RCA stent sub-total ISR >> PCI:  POBA  . Chronic systolic CHF (congestive heart failure) ([place])    Echo 9/18: Inferior akinesis, mild LVH, EF 40, status post TAVR with no perivalvular regurgitation, MAC, mild MR, moderate LAE, mild RAE, PASP 36 // Echo 3/19: EF 20-25, mild MR, AVR ok, mild to mod TR  . Complete heart block ([place])    a. s/p MDT CRTP 04/2016 Dr [name]  . GERD (gastroesophageal reflux disease)   . History of kidney stones 1970   /notes 01/03/2011  . HTN (hypertension)   . Hyperlipidemia   . Hypothyroidism   . Neuromuscular disorder ([place])   . Peripheral neuropathy   . Psoriasis    severe; with total skin exfoliation in the past resolved  . S/P TAVR (transcatheter aortic valve replacement)    severe AS >> s/p TAVR 9/17 // Limited Echo 10/17: EF 40-45%, lat and inf-lat HK, TAVR ok, MAC, mild BAE, PASP 31 mmHg, no effusion  . Ventricular tachycardia, non-sustained (HCC)    Surgical Hx: The patient  has a past surgical history that includes decompression of left median nerve; Colonoscopy (11/23/2011); Tonsillectomy; Appendectomy; Total knee arthroplasty (04/16/2012); Cataract extraction, bilateral; Carpal tunnel release (Bilateral, 2009-2011); left heart catheterization with coronary angiogram (N/A, 12/27/2012); TEE without cardioversion (N/A, 04/21/2016); Cardiac  catheterization (N/A, 04/27/2016); Transcatheter aortic valve replacement, transfemoral (N/A, 05/16/2016); TEE without cardioversion (N/A, 05/16/2016); Cardiac catheterization (N/A, 11/17/2015); Cardiac catheterization (N/A, 11/17/2015); Cardiac catheterization (N/A, 04/19/2016); Coronary angioplasty with stent; Coronary angioplasty with stent (08/09/2017); Coronary angioplasty with stent (11/17/2015); Lumbar laminectomy (Bilateral, 09/2004); Carotid endarterectomy (Left, 02/2008); Synovial cyst excision (09/2004); Back surgery; Joint replacement; Lumbar disc surgery; Anterior cervical decomp/discectomy fusion; Kidney stone surgery; Coronary artery bypass graft (1995); RIGHT/LEFT HEART CATH AND CORONARY/GRAFT ANGIOGRAPHY (N/A, 08/09/2017); and CORONARY STENT INTERVENTION (N/A, 08/09/2017).   Current Medications: Current Meds  Medication Sig  . allopurinol (ZYLOPRIM) 300 MG tablet Take 300 mg by mouth daily.  Marland Kitchen amoxicillin (AMOXIL) 500 MG tablet Take 4 tablets (2,000 mg total) by mouth as needed. Take 4 of the 500 mg tablets = 2000 mg 60 minutes before dental procedure  . aspirin 81 MG chewable tablet Chew 1 tablet (81 mg total) by mouth daily.  Marland Kitchen atorvastatin (LIPITOR) 20 MG tablet Take 20 mg by mouth every evening.   . clopidogrel (PLAVIX) 75 MG tablet Take 1 tablet (75 mg total) by mouth daily with  breakfast.  . Docusate Sodium (COLACE PO) Take 2 tablets by mouth daily as needed (constipation).   . fluticasone (FLONASE) 50 MCG/ACT nasal spray Place 1 spray into both nostrils 2 (two) times daily.  Marland Kitchen latanoprost (XALATAN) 0.005 % ophthalmic solution Place 1 drop into both eyes at bedtime.  Marland Kitchen levothyroxine (SYNTHROID, LEVOTHROID) 112 MCG tablet Take 112 mcg by mouth daily before breakfast.  . losartan (COZAAR) 25 MG tablet Take 1 tablet (25 mg total) by mouth daily. HOLD UNTIL ADVISED BY CARDIOLOGY  . Magnesium 250 MG TABS Take 1 tablet by mouth 2 (two) times daily.  . Methylcellulose, Laxative, (CITRUCEL  PO) Take by mouth as directed. Take 1 tablespoon at night  . metoprolol tartrate (LOPRESSOR) 25 MG tablet TAKE HALF TABLET BY MOUTH TWICE DAILY  . multivitamin (THERAGRAN) per tablet Take 1 tablet by mouth at bedtime.   . nitroGLYCERIN (NITROSTAT) 0.4 MG SL tablet Place 0.4 mg under the tongue every 5 (five) minutes x 3 doses as needed. For chest pain  . potassium chloride SA (K-DUR,KLOR-CON) 20 MEQ tablet Take 1.5 tablets (30 mEq total) by mouth daily.  Marland Kitchen pyridOXINE (VITAMIN B-6) 100 MG tablet Take 100 mg by mouth daily.   . Tamsulosin HCl (FLOMAX) 0.4 MG CAPS Take 0.4 mg by mouth daily.   . [DISCONTINUED] losartan (COZAAR) 25 MG tablet Take 1 tablet (25 mg total) by mouth daily.     Allergies:   Other and Propylene glycol   Social History   Tobacco Use  . Smoking status: Never Smoker  . Smokeless tobacco: Never Used  Substance Use Topics  . Alcohol use: Yes    Alcohol/week: 0.6 oz    Types: 1 Cans of beer per week  . Drug use: No     Family Hx: The patient's family history includes Heart attack in his mother; Heart attack (age of onset: 68) in his son; Heart disease in his father and mother; Hyperlipidemia in his father; Hypertension in his father. There is no history of Colon cancer.  ROS:   Please see the history of present illness.    Review of Systems  Cardiovascular: Positive for leg swelling and syncope.  Respiratory: Positive for cough.   Hematologic/Lymphatic: Bruises/bleeds easily.  Neurological: Positive for dizziness and loss of balance.   All other systems reviewed and are negative.   EKGs/Labs/Other Test Reviewed:    EKG:  EKG is not ordered today.    Recent Labs: 01/24/2017: Magnesium 2.3 08/07/2017: NT-Pro BNP 2,488 12/28/2017: ALT 36; BUN 30; Creatinine, Ser 1.24; Hemoglobin 9.6; Platelets 128; Potassium 4.5; Sodium 138   Recent Lipid Panel Lab Results  Component Value Date/Time   CHOL 143 08/06/2006 08:14 AM   TRIG 141 08/06/2006 08:14 AM   HDL 29.7  (L) 08/06/2006 08:14 AM   CHOLHDL 4.8 CALC 08/06/2006 08:14 AM   LDLCALC 85 08/06/2006 08:14 AM    Physical Exam:    VS:  BP 119/64   Pulse 67   Ht _0  (1.727 m)   Wt 191 lb 11.2 oz (87 kg)   SpO2 94%   BMI 29.15 kg/m     Wt Readings from Last 3 Encounters:  01/15/18 191 lb 11.2 oz (87 kg)  01/11/18 190 lb (86.2 kg)  01/03/18 188 lb (85.3 kg)     Physical Exam  Constitutional: [name] is oriented to person, place, and time. [name] appears well-developed and well-nourished. No distress.  HENT:  Head: Normocephalic and atraumatic.  Neck: Neck supple.  Cardiovascular:  Normal rate, regular rhythm, S1 normal and S2 normal.  Murmur heard.  Low-pitched systolic murmur is present with a grade of 2/6 at the upper left sternal border. Pulmonary/Chest: Effort normal. [name] has decreased breath sounds. [name] has no rales.  Abdominal: Soft.  Musculoskeletal: [name] exhibits edema (tight 2+ R leg edema up to the thigh; trace edema on the L).  Neurological: [name] is alert and oriented to person, place, and time.  Skin: Skin is warm and dry.    ASSESSMENT & PLAN:    Syncope, unspecified syncope type [name] has symptoms that are very consistent with orthostatic hypotension.  I suspect that this is the reason for his syncope.  We had his device interrogated 2 weeks ago when [name] called with a syncopal episode.  This demonstrated no episodes of arrhythmias.  [name] describes weakness after standing and also notes that his blood pressure has been quite low at home when [name] feels poorly.  His medications have been steadily decreased over the past couple of months.  Compounding his symptoms is anemia which is currently being worked up by hematology.  [name] also requires further diuresis for his heart failure, which is likely to worsen his orthostasis.  I have asked him to continue to wear compression stockings.  For now, [name] can use a compression stocking on the left leg until his right leg improves.  I will also reduce his  medications.  -Compression stockings  -Hold losartan  -Change metoprolol to every evening  -We discussed proper hygiene for orthostasis  Chronic combined systolic and diastolic CHF (congestive heart failure) (HCC) Recent thoracic impedance was abnormal suggesting fluid accumulation.  I suspect that this is contributing to his worsening shortness of breath.  -Increase Lasix to 80 mg daily x3 days, then resume 60 mg daily  -Continue follow-up with ICM clinic  Right leg swelling  His right leg is significantly swollen.  This is likely related to his injury.  However, [name] is at somewhat increased risk for DVT.  I will arrange a right lower extremity venous duplex to rule out DVT.  Coronary artery disease involving coronary bypass graft of native heart without angina pectoris Prior history of CABG and subsequent PCI of the SVG-OM1, drug-eluting stent x2 to the SVG-RCA in 12/18 and recent non-STEMI while in [place] in 3/19 2/2 in-stent restenosis of the S-RCA treated with POBA.  His anginal equivalent in the past has been shortness of breath.  I suspect that his current shortness of breath is more related to heart failure in the setting of anemia.  I will adjust his diuresis as noted above.  Continue aspirin, Plavix, beta-blocker, statin.  I will review further with Dr. Burt Knack.  [name] will be brought back in close follow-up in the next 2 weeks.  Severe Aortic Stenosis S/P TAVR (transcatheter aortic valve replacement) Normally functioning aortic valve prosthesis by echocardiogram March 2019.  Continue SBE prophylaxis.  S/P biventricular cardiac pacemaker procedure Continue follow-up with EP and ICM clinic.  Neuropathy [name] tells me that [name] has a long history of neuropathy.  This has been getting worse over time.  I suspect that this is likely contributing to his orthostatic intolerance.  I have asked him to follow-up with his primary care doctor for further management.   Dispo:  Return in about 2 weeks  (around 01/29/2018) for Close Follow Up, w/ Dr. Burt Knack, or [name], PA-C.   Medication Adjustments/Labs and Tests Ordered: Current medicines are reviewed at length with the patient today.  Concerns regarding medicines are outlined above.  Tests Ordered: No orders of the defined types were placed in this encounter.  Medication Changes: Meds ordered this encounter  Medications  . losartan (COZAAR) 25 MG tablet    Sig: Take 1 tablet (25 mg total) by mouth daily. HOLD UNTIL ADVISED BY CARDIOLOGY    Dose change    Signed, [name], PA-C  01/15/2018 4:46 PM    [facility] [place], [place], [place]  [address] Phone: [phone]; Fax: [phone]

## 2018-01-16 ENCOUNTER — Ambulatory Visit (HOSPITAL_COMMUNITY)
Admission: RE | Admit: 2018-01-16 | Discharge: 2018-01-16 | Disposition: A | Discharge status: Home / Self Care | Payer: Medicare HMO | Source: Ambulatory Visit | Attending: Physician Assistant | Admitting: Physician Assistant

## 2018-01-16 DIAGNOSIS — M7989 Other specified soft tissue disorders: Secondary | ICD-10-CM | POA: Diagnosis not present

## 2018-01-18 ENCOUNTER — Inpatient Hospital Stay (HOSPITAL_COMMUNITY): Payer: Medicare HMO | Admitting: Hematology

## 2018-01-18 ENCOUNTER — Encounter (HOSPITAL_COMMUNITY): Payer: Self-pay | Admitting: Hematology

## 2018-01-18 ENCOUNTER — Other Ambulatory Visit: Payer: Self-pay

## 2018-01-18 VITALS — BP 113/69 | HR 73 | Temp 97.9°F | Resp 18 | Wt 190.0 lb

## 2018-01-18 DIAGNOSIS — N189 Chronic kidney disease, unspecified: Secondary | ICD-10-CM

## 2018-01-18 DIAGNOSIS — R531 Weakness: Secondary | ICD-10-CM | POA: Diagnosis not present

## 2018-01-18 DIAGNOSIS — I129 Hypertensive chronic kidney disease with stage 1 through stage 4 chronic kidney disease, or unspecified chronic kidney disease: Secondary | ICD-10-CM

## 2018-01-18 DIAGNOSIS — I10 Essential (primary) hypertension: Secondary | ICD-10-CM | POA: Diagnosis not present

## 2018-01-18 DIAGNOSIS — D509 Iron deficiency anemia, unspecified: Secondary | ICD-10-CM

## 2018-01-18 DIAGNOSIS — R600 Localized edema: Secondary | ICD-10-CM | POA: Diagnosis not present

## 2018-01-18 DIAGNOSIS — D649 Anemia, unspecified: Secondary | ICD-10-CM

## 2018-01-18 DIAGNOSIS — D631 Anemia in chronic kidney disease: Secondary | ICD-10-CM

## 2018-01-18 DIAGNOSIS — Z7901 Long term (current) use of anticoagulants: Secondary | ICD-10-CM | POA: Diagnosis not present

## 2018-01-18 NOTE — Patient Instructions (Signed)
[facility] at [facility] Discharge Instructions  Today you saw Dr. K.   Thank you for choosing [facility] at [facility] to provide your oncology and hematology care.  To afford each patient quality time with our provider, please arrive at least 15 minutes before your scheduled appointment time.   If you have a lab appointment with the Cancer Center please come in thru the  Main Entrance and check in at the main information desk  You need to re-schedule your appointment should you arrive 10 or more minutes late.  We strive to give you quality time with our providers, and arriving late affects you and other patients whose appointments are after yours.  Also, if you no show three or more times for appointments you may be dismissed from the clinic at the providers discretion.     Again, thank you for choosing [facility].  Our hope is that these requests will decrease the amount of time that you wait before being seen by our physicians.       _____________________________________________________________  Should you have questions after your visit to [facility], please contact our office at [phone] between the hours of 8:30 a.m. and 4:30 p.m.  Voicemails left after 4:30 p.m. will not be returned until the following business day.  For prescription refill requests, have your pharmacy contact our office.       Resources For Cancer Patients and their Caregivers ? American Cancer Society: Can assist with transportation, wigs, general needs, runs Look Good Feel Better.        1-888-227-6333 ? Cancer Care: Provides financial assistance, online support groups, medication/co-pay assistance.  1-800-813-HOPE (4673) ? [facility] Assists [place] cancer patients and their families through emotional , educational and financial support.  336-427-4357 ? [place] DSS Where to apply for food  stamps, Medicaid and utility assistance. 336-342-1394 ? [facility]: Transportation to medical appointments. 336-347-2287 ? Social Security Administration: May apply for disability if have a Stage IV cancer. 336-342-7796 1-800-772-1213 ? [place] Aging, Disability and Transit Services: Assists with nutrition, care and transit needs. 336-349-2343  Cancer Center Support Programs:   > Cancer Support Group  2nd Tuesday of the month 1pm-2pm, Journey Room   > Creative Journey  3rd Tuesday of the month 1130am-1pm, Journey Room   [facility] at [facility] Discharge Instructions Today you saw Dr. K.    Thank you for choosing [facility] at [facility] to provide your oncology and hematology care.  To afford each patient quality time with our provider, please arrive at least 15 minutes before your scheduled appointment time.   If you have a lab appointment with the Cancer Center please come in thru the  Main Entrance and check in at the main information desk  You need to re-schedule your appointment should you arrive 10 or more minutes late.  We strive to give you quality time with our providers, and arriving late affects you and other patients whose appointments are after yours.  Also, if you no show three or more times for appointments you may be dismissed from the clinic at the providers discretion.     Again, thank you for choosing [facility].  Our hope is that these requests will decrease the amount of time that you wait before being seen by our physicians.       _____________________________________________________________    Should you have questions after your visit to [facility], please contact our office at [phone] between the hours of 8:30 a.m. and 4:30 p.m.  Voicemails left after 4:30 p.m. will not be returned until the following business day.  For prescription refill requests, have your pharmacy contact our  office.       Resources For Cancer Patients and their Caregivers ? American Cancer Society: Can assist with transportation, wigs, general needs, runs Look Good Feel Better.        1-888-227-6333 ? Cancer Care: Provides financial assistance, online support groups, medication/co-pay assistance.  1-800-813-HOPE (4673) ? [facility] Assists [place] cancer patients and their families through emotional , educational and financial support.  336-427-4357 ? [place] DSS Where to apply for food stamps, Medicaid and utility assistance. 336-342-1394 ? [facility]: Transportation to medical appointments. 336-347-2287 ? Social Security Administration: May apply for disability if have a Stage IV cancer. 336-342-7796 1-800-772-1213 ? [place] Aging, Disability and Transit Services: Assists with nutrition, care and transit needs. 336-349-2343  Cancer Center Support Programs:   > Cancer Support Group  2nd Tuesday of the month 1pm-2pm, Journey Room   > Creative Journey  3rd Tuesday of the month 1130am-1pm, Journey Room    

## 2018-01-18 NOTE — Assessment & Plan Note (Addendum)
1.  Normocytic anemia: - Recent CBC done at Dr. Ria Comment office showed a hemoglobin of 9.6 with a normal MCV, normal white count and platelet count.  Creatinine was mildly elevated at 1.2.  Denies any bleeding per rectum or melena.  He is on Plavix for many years.  Has taken iron tablet intermittently, stopped about 1 week ago.  It caused him constipation.  I have reviewed labs in our epic system.  He has some degree of anemia at least since 2013. -Last colonoscopy was in April 2013, a tubular adenoma was removed. - Stool for occult blood checked in our office was negative.  Creatinine was 1.24.  Ferritin was 47 and percent saturation was 14.  Serum protein after versus was negative. -Normocytic anemia from mild chronic kidney disease and relative iron deficiency state.  He has taken iron pills in the past but could not tolerate secondary to constipation.  I have recommended parenteral iron therapy in the form of Feraheme weekly twice.  We talked about the side effects of Feraheme including but not limited to serious allergic reactions.  He understands and gives [place] permission to proceed with the treatment.  I plan to see him back in 6 weeks with a repeat CBC, ferritin and iron panel.  If the hemoglobin does not improve after Feraheme, we also talked about initiating him on erythropoiesis stimulating agents like Procrit.
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
## 2018-01-22 ENCOUNTER — Inpatient Hospital Stay (HOSPITAL_COMMUNITY): Payer: Medicare HMO | Attending: Hematology

## 2018-01-22 ENCOUNTER — Encounter (HOSPITAL_COMMUNITY): Payer: Self-pay

## 2018-01-22 VITALS — BP 129/62 | HR 75 | Temp 97.6°F | Resp 18

## 2018-01-22 DIAGNOSIS — D509 Iron deficiency anemia, unspecified: Secondary | ICD-10-CM | POA: Insufficient documentation

## 2018-01-22 DIAGNOSIS — D649 Anemia, unspecified: Secondary | ICD-10-CM

## 2018-01-22 MED ORDER — SODIUM CHLORIDE 0.9 % IV SOLN
510.0000 mg | Freq: Once | INTRAVENOUS | Status: AC
  Administered 2018-01-22: 510 mg via INTRAVENOUS
  Filled 2018-01-22: qty 17

## 2018-01-22 MED ORDER — SODIUM CHLORIDE 0.9 % IV SOLN
Freq: Once | INTRAVENOUS | Status: AC
  Administered 2018-01-22: 14:00:00 via INTRAVENOUS

## 2018-01-22 NOTE — Patient Instructions (Signed)
[facility] at [facility] Discharge Instructions  Received Feraheme infusion today. Follow-up as scheduled. Call clinic for any questions or concerns   Thank you for choosing [facility] at [facility] to provide your oncology and hematology care.  To afford each patient quality time with our provider, please arrive at least 15 minutes before your scheduled appointment time.   If you have a lab appointment with the Cancer Center please come in thru the  Main Entrance and check in at the main information desk  You need to re-schedule your appointment should you arrive 10 or more minutes late.  We strive to give you quality time with our providers, and arriving late affects you and other patients whose appointments are after yours.  Also, if you no show three or more times for appointments you may be dismissed from the clinic at the providers discretion.     Again, thank you for choosing [facility].  Our hope is that these requests will decrease the amount of time that you wait before being seen by our physicians.       _____________________________________________________________  Should you have questions after your visit to [facility], please contact our office at [phone] between the hours of 8:30 a.m. and 4:30 p.m.  Voicemails left after 4:30 p.m. will not be returned until the following business day.  For prescription refill requests, have your pharmacy contact our office.       Resources For Cancer Patients and their Caregivers ? American Cancer Society: Can assist with transportation, wigs, general needs, runs Look Good Feel Better.        1-888-227-6333 ? Cancer Care: Provides financial assistance, online support groups, medication/co-pay assistance.  1-800-813-HOPE (4673) ? [facility] Assists [place] cancer patients and their families through emotional , educational and  financial support.  336-427-4357 ? [place] DSS Where to apply for food stamps, Medicaid and utility assistance. 336-342-1394 ? [facility]: Transportation to medical appointments. 336-347-2287 ? Social Security Administration: May apply for disability if have a Stage IV cancer. 336-342-7796 1-800-772-1213 ? [place] Aging, Disability and Transit Services: Assists with nutrition, care and transit needs. 336-349-2343  Cancer Center Support Programs:   > Cancer Support Group  2nd Tuesday of the month 1pm-2pm, Journey Room   > Creative Journey  3rd Tuesday of the month 1130am-1pm, Journey Room    

## 2018-01-22 NOTE — Progress Notes (Signed)
[name] tolerated Feraheme infusion well without complaints or incident. VSS upon discharge. Pt discharged via wheelchair in satisfactory condition accompanied by his wife

## 2018-01-23 ENCOUNTER — Ambulatory Visit (INDEPENDENT_AMBULATORY_CARE_PROVIDER_SITE_OTHER): Payer: Medicare HMO | Admitting: Orthopaedic Surgery

## 2018-01-23 ENCOUNTER — Encounter (INDEPENDENT_AMBULATORY_CARE_PROVIDER_SITE_OTHER): Payer: Self-pay | Admitting: Orthopaedic Surgery

## 2018-01-23 VITALS — BP 129/74 | HR 79 | Ht 68.0 in | Wt 190.0 lb

## 2018-01-23 DIAGNOSIS — M25561 Pain in right knee: Secondary | ICD-10-CM
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
## 2018-01-24 ENCOUNTER — Ambulatory Visit (INDEPENDENT_AMBULATORY_CARE_PROVIDER_SITE_OTHER): Payer: Self-pay

## 2018-01-24 DIAGNOSIS — Z950 Presence of cardiac pacemaker: Secondary | ICD-10-CM

## 2018-01-24 DIAGNOSIS — I5042 Chronic combined systolic (congestive) and diastolic (congestive) heart failure: Secondary | ICD-10-CM

## 2018-01-24 NOTE — Progress Notes (Signed)
EPIC Encounter for ICM Monitoring  Patient Name: [name] is a 84 y.o. male Date: 01/24/2018 Primary Care Physican: [name], MD Primary Cardiologist:Cooper/Weaver, PA Electrophysiologist:Allred Dry Weight:189lbs at 5/13 office weight (not weighed at home) Bi-V Pacing:90.1%since 6/3       Heart Failure questions reviewed, pt state he thinks the weakness is a little worse since last office visit 01/15/2018 with [name], PA.   Intermittently feels short of breath when walking but no worse than usual.     Thoracic impedance remains abnormal suggesting fluid accumulation since 12/31/2017.  Furosemide was increased to 80 mg x 1 day on 01/11/2018  Prescribed dosage: Furosemide40 mgTake 1.5 tablets (60 mg total) by mouth daily.  LABS: 12/28/2017 Creatinine 1.24, BUN 30, Potassium 4.5, Sodium 138, EGFR 52-60 08/10/2017 Creatinine 1.08, BUN 18, Potassium 3.5, Sodium 135, EGFR >60  Recommendations:   Follow-up plan: ICM clinic phone appointment on 02/01/2018 to recheck fluid levels.  Office appointment scheduled 02/04/2018 with [name], PA and Dr [name] 02/27/2018.  Copy of ICM check sent to Dr. [name] and [name], PA.   3 month ICM trend: 01/24/2018    1 Year ICM trend:       Rosalene Billings, RN 01/24/2018 4:42 PM

## 2018-01-25 NOTE — Progress Notes (Signed)
Attempted call to patient to advise of [name] recommendations and voice mail box full.

## 2018-01-25 NOTE — Progress Notes (Signed)
Attempted cell call to patient to advise of [name] recommendations and voice mail box full.  Also attempted home number and left message to return call today. Provided call back number.

## 2018-01-25 NOTE — Progress Notes (Signed)
Take Lasix 80 mg in AM and 40 mg in PM x 2 days. Then, change Lasix to 40 mg Twice daily  Increase K+ to 20 mEq Twice daily  BMET 5 days. Check device with ICM clinic as planned next week [name], PA-C    01/25/2018 1:03 PM

## 2018-01-28 MED ORDER — FUROSEMIDE 40 MG PO TABS: 40.0000 mg | ORAL_TABLET | Freq: Two times a day (BID) | ORAL | 3 refills | Status: DC

## 2018-01-28 MED ORDER — POTASSIUM CHLORIDE CRYS ER 20 MEQ PO TBCR: 20.0000 meq | EXTENDED_RELEASE_TABLET | Freq: Two times a day (BID) | ORAL | 3 refills | Status: DC

## 2018-01-28 NOTE — Progress Notes (Signed)
Spoke with wife.  She said they have very poor cell phone reception so she drove to get better reception for call.  She advised for future can leave any changes on voice mail if needed.  Advised [name], [place] ordered to take Lasix 40 mg 2 tablets (80 mg total) in AM and 1 tablet PM x 2 days.  After 2nd day, take Lasix 40 mg 1 tablet twice a day.  Also increase Potassium 20 mEq 1 tablet to twice a day.  BMET in 5 days. He will have labs drawn 02/01/2018 at [facility] on [address] in [place].  She read back correct instructions regarding medication changes and BMET.  She said he BP runs very low at times. Advised if the 1st increased Lasix dosage drops the BP too low to either call me or call Scott's office to advice if should continue with increase.  She verbalized understanding.  Explained will send script change to pharmacy and she currently has enough on hand for changes in Lasix and Potassium.  Recheck transmission on 11/01/2017.

## 2018-01-28 NOTE — Progress Notes (Signed)
Attempted cell call to patient on home number to advise of [name] recommendations and left message to return call today.  Attempted cell number and mail box is full.

## 2018-01-29 ENCOUNTER — Other Ambulatory Visit: Payer: Self-pay

## 2018-01-29 ENCOUNTER — Inpatient Hospital Stay (HOSPITAL_COMMUNITY): Payer: Medicare HMO

## 2018-01-29 VITALS — BP 114/58 | HR 77 | Temp 97.5°F | Resp 18

## 2018-01-29 DIAGNOSIS — L821 Other seborrheic keratosis: Secondary | ICD-10-CM | POA: Diagnosis not present

## 2018-01-29 DIAGNOSIS — D509 Iron deficiency anemia, unspecified: Secondary | ICD-10-CM | POA: Diagnosis not present

## 2018-01-29 DIAGNOSIS — I872 Venous insufficiency (chronic) (peripheral): Secondary | ICD-10-CM | POA: Diagnosis not present

## 2018-01-29 DIAGNOSIS — D649 Anemia, unspecified: Secondary | ICD-10-CM

## 2018-01-29 DIAGNOSIS — D1801 Hemangioma of skin and subcutaneous tissue: Secondary | ICD-10-CM | POA: Diagnosis not present

## 2018-01-29 DIAGNOSIS — L814 Other melanin hyperpigmentation: Secondary | ICD-10-CM | POA: Diagnosis not present

## 2018-01-29 DIAGNOSIS — Z85828 Personal history of other malignant neoplasm of skin: Secondary | ICD-10-CM | POA: Diagnosis not present

## 2018-01-29 MED ORDER — SODIUM CHLORIDE 0.9 % IV SOLN
510.0000 mg | Freq: Once | INTRAVENOUS | Status: AC
  Administered 2018-01-29: 510 mg via INTRAVENOUS
  Filled 2018-01-29: qty 17

## 2018-01-29 MED ORDER — SODIUM CHLORIDE 0.9 % IV SOLN
Freq: Once | INTRAVENOUS | Status: AC
  Administered 2018-01-29: 12:00:00 via INTRAVENOUS

## 2018-01-29 NOTE — Progress Notes (Signed)
Tolerated infusion w/o adverse reaction.  Alert, in no distress.  VSS.  Discharged via wheelchair in c/o spouse.  

## 2018-01-31 DIAGNOSIS — H401131 Primary open-angle glaucoma, bilateral, mild stage: Secondary | ICD-10-CM | POA: Diagnosis not present

## 2018-02-01 ENCOUNTER — Other Ambulatory Visit: Payer: Self-pay | Admitting: Physician Assistant

## 2018-02-01 ENCOUNTER — Ambulatory Visit (INDEPENDENT_AMBULATORY_CARE_PROVIDER_SITE_OTHER): Payer: Self-pay

## 2018-02-01 ENCOUNTER — Telehealth: Payer: Self-pay

## 2018-02-01 DIAGNOSIS — I5042 Chronic combined systolic (congestive) and diastolic (congestive) heart failure: Secondary | ICD-10-CM | POA: Diagnosis not present

## 2018-02-01 DIAGNOSIS — Z950 Presence of cardiac pacemaker: Secondary | ICD-10-CM

## 2018-02-01 NOTE — Telephone Encounter (Signed)
Remote ICM transmission received.  Attempted call to patient and left detailed message, per DPR, regarding transmission and next ICM scheduled for 02/19/2018.  Advised to return call for any fluid symptoms or questions.    

## 2018-02-01 NOTE — Progress Notes (Signed)
Ok. Thank you! [name], PA-C    02/01/2018 12:54 PM

## 2018-02-01 NOTE — Progress Notes (Signed)
EPIC Encounter for ICM Monitoring  Patient Name: [name] is a 84 y.o. male Date: 02/01/2018 Primary Care Physican: Fagan, Roy, MD Primary Cardiologist:Cooper/Weaver, PA Electrophysiologist:Allred Dry Weight:189lbs at 5/13 office weight (not weighed at home)  Clinical Status (24-Jan-2018 to 01-Feb-2018) Bi-V Pacing:86.6%        Attempted call to patient and unable to reach.  Left detailed message, per DPR, regarding transmission.  Transmission reviewed.   Patient due to have BMET drawn today, 02/01/2018.   Thoracic impedance returned to normal after increase in Furosemide by Scott Weaver PA on 01/28/2018.    Prescribed dosage: Furosemide40 mgTake 1 tablet (40 mg total) by mouth twice a day.  LABS: 12/28/2017 Creatinine 1.24, BUN 30, Potassium 4.5, Sodium 138, EGFR 52-60 08/10/2017 Creatinine 1.08, BUN 18, Potassium 3.5, Sodium 135, EGFR >60  Recommendations:  Left voice mail with ICM number and encouraged to call if experiencing any fluid symptoms.  Follow-up plan: ICM clinic phone appointment on 02/19/2018.  Office appointment scheduled 02/04/2018 with Scott Weaver, PA and 02/27/2018 with Dr. Allred.  Copy of ICM check sent to Dr. Allred, Dr Cooper and Scott Weaver, PA.   3 month ICM trend: 02/01/2018    1 Year ICM trend:       Laurie S Short, RN 02/01/2018 7:28 AM   

## 2018-02-02 LAB — BASIC METABOLIC PANEL
BUN/Creatinine Ratio: 16 (ref 10–24)
BUN: 21 mg/dL (ref 8–27)
CHLORIDE: 99 mmol/L (ref 96–106)
CO2: 27 mmol/L (ref 20–29)
Calcium: 9.5 mg/dL (ref 8.6–10.2)
Creatinine, Ser: 1.34 mg/dL — ABNORMAL HIGH (ref 0.76–1.27)
GFR calc Af Amer: 56 mL/min/{1.73_m2} — ABNORMAL LOW (ref >59)
GFR calc non Af Amer: 48 mL/min/{1.73_m2} — ABNORMAL LOW (ref >59)
GLUCOSE: 100 mg/dL — AB (ref 65–99)
POTASSIUM: 4.3 mmol/L (ref 3.5–5.2)
Sodium: 142 mmol/L (ref 134–144)

## 2018-02-03 NOTE — Progress Notes (Signed)
Cardiology Office Note:    Date:  02/04/2018   ID:  GLADSTONE ROSAS, DOB [birth year], MRN [id number]  PCP:  [name], MD  Cardiologist:  [name], MD   Referring MD: [name], MD   Chief Complaint  Patient presents with  . Follow-up    CHF    History of Present Illness:    EULAN HEYWARD is a 84 y.o. male with coronary artery disease status post CABGin 2010.  He has undergone multiple PCI procedures since.  He underwent stenting of the vein graft to the OM1in 2017, DES to ostial and distal portions of the S-R PDA in 07/2017.  Other hx includes aortic stenosis status post TAVR in September 2017, complete heart block status postCRT-P (Medtronic MRI compatible), combined systolic and diastolic heart failure with EF 40%, hypertension, hyperlipidemia. He was admitted to the hospital in [place], [place] with syncope and ruled in for a non-STEMI in 10/2017.  His LVF was worse on Echo with an EF of 20-25%. Cardiac catheterization demonstrated high-grade in-stent restenosis in the vein graft to the RCA which was treated with balloon angioplasty.  I have seen him several times since that admission.  He has had worsening dyspnea.  Medical therapy has been continued as options for treating his CAD are limited.  I did enroll him in the [facility].    He was last seen 01/15/18.  He had recently passed out and injured his knee (patellar fracture). Interrogation of his device demonstrated no arrhythmias to explain his syncope.  He has had evidence of volume overload based upon thoracic impedance.  I have adjusted his diuresis.  Unfortunately, his syncopal episodes appear to be related to orthostatic hypotension.  Therefore, I have had him hold his ARB.  I have also suggested he wear compression stockings.  Of note, a LE venous US was neg for DVT.  His most recent thoracic impedance measurements on 02/01/18 were normal.     Mr. Statler returns for close follow up.  He is here with his wife.  He  is feeling much better.  His hematologist had him receive IV Iron x 2.  Since his Lasix was adjusted and he received Iron, he feels much better.  He denies chest pain, paroxysmal nocturnal dyspnea, further syncope.  He notes his breathing is improved.  His leg swelling is also improved.   Prior CV studies:   The following studies were reviewed today:  Venous Duplex 01/16/18 Final Interpretation: Right: No evidence of deep vein thrombosis in the lower extremity. No indirect evidence of obstruction proximal to the inguinal ligament. No cystic structure found in the popliteal fossa. Fluid noted in the medial part of the knee. Left: No evidence of common femoral vein obstruction.  Cardiac catheterization 11/12/2017([facility], [place], [place]) LVEDP 16 EF 50% LM occluded RCA occluded SVG-RCA patent with mid stent with subtotal ISR SVG-OM patent stent LIMA-LAD patent PCI: POBA to the SVG-RCA  Echo 11/13/2017([facility], [place], [place]) EF 20-25, diffuse HK, mild MR, normally functioning aortic valve prosthesis, mild to moderate TR  Cardiac Catheterization12/20/18 LM ostial 100 RCA proximal 100-CTO SVG-RPDA ostial 90, distal 90 SVG-OM1 stent patent LIMA-LAD patent EF 40-45 Mean PCWP 20; LVEDP 22 PCI: 4 x 12 mm Resolute Onyx DES to the ostial/proximal SVG-RPDA  PCI: 3 x 22 mm Resolute Onyx DES to the distal SVG-RPDA Recommend: Continue clopidogrel without interruption, add aspirin 81 mg daily, diurese overnight with a single dose of  IV Lasix and then continue oral furosemide. If no complications arise should be okay for discharge tomorrow.  Carotid US 05/14/17 L CEA patent; RICA 40-59  Echo 05/09/17 Inferior akinesis, mild LVH, EF 40, status post TAVR with no perivalvular regurgitation, MAC, mild MR, moderate LAE, mild RAE, PASP 36  Echo 06/22/16 Mild LVH, EF 40-45, diffuse HK, grade 2 diastolic dysfunction, status post TAVR with no  significant stenosis or perivalvular regurgitation, MAC, trivial MR, mild LAE, mild RAE, PASP 39  Past Medical History:  Diagnosis Date  . Arthritis   . Benign prostatic hypertrophy   . CAD (coronary artery disease)    DES, SVG to circumflex marginal, October, 2010 ( SVG to PDA and PLA patent , LIMA to LAD patent, EF 60%, mild inferior hypokinesis 12/18 PCI/DES to SVG-PDA x2, EF 40% // NSTEMI 3/19 [facility]) >> S-OM and L-LAD ok; S-RCA stent sub-total ISR >> PCI:  POBA  . Chronic systolic CHF (congestive heart failure) ([place])    Echo 9/18: Inferior akinesis, mild LVH, EF 40, status post TAVR with no perivalvular regurgitation, MAC, mild MR, moderate LAE, mild RAE, PASP 36 // Echo 3/19: EF 20-25, mild MR, AVR ok, mild to mod TR  . Complete heart block ([place])    a. s/p MDT CRTP 04/2016 Dr [name]  . GERD (gastroesophageal reflux disease)   . History of kidney stones 1970   /notes 01/03/2011  . HTN (hypertension)   . Hyperlipidemia   . Hypothyroidism   . Neuromuscular disorder ([place])   . Peripheral neuropathy   . Psoriasis    severe; with total skin exfoliation in the past resolved  . S/P TAVR (transcatheter aortic valve replacement)    severe AS >> s/p TAVR 9/17 // Limited Echo 10/17: EF 40-45%, lat and inf-lat HK, TAVR ok, MAC, mild BAE, PASP 31 mmHg, no effusion  . Ventricular tachycardia, non-sustained (HCC)    Surgical Hx: The patient  has a past surgical history that includes decompression of left median nerve; Colonoscopy (11/23/2011); Tonsillectomy; Appendectomy; Total knee arthroplasty (04/16/2012); Cataract extraction, bilateral; Carpal tunnel release (Bilateral, 2009-2011); left heart catheterization with coronary angiogram (N/A, 12/27/2012); TEE without cardioversion (N/A, 04/21/2016); Cardiac catheterization (N/A, 04/27/2016); Transcatheter aortic valve replacement, transfemoral (N/A, 05/16/2016); TEE without cardioversion (N/A, 05/16/2016); Cardiac catheterization (N/A, 11/17/2015); Cardiac  catheterization (N/A, 11/17/2015); Cardiac catheterization (N/A, 04/19/2016); Coronary angioplasty with stent; Coronary angioplasty with stent (08/09/2017); Coronary angioplasty with stent (11/17/2015); Lumbar laminectomy (Bilateral, 09/2004); Carotid endarterectomy (Left, 02/2008); Synovial cyst excision (09/2004); Back surgery; Joint replacement; Lumbar disc surgery; Anterior cervical decomp/discectomy fusion; Kidney stone surgery; Coronary artery bypass graft (1995); RIGHT/LEFT HEART CATH AND CORONARY/GRAFT ANGIOGRAPHY (N/A, 08/09/2017); and CORONARY STENT INTERVENTION (N/A, 08/09/2017).   Current Medications: Current Meds  Medication Sig  . allopurinol (ZYLOPRIM) 300 MG tablet Take 300 mg by mouth daily.  Marland Kitchen amoxicillin (AMOXIL) 500 MG tablet Take 500 mg by mouth as needed (Prior to dental appt.).  Marland Kitchen aspirin 81 MG chewable tablet Chew 1 tablet (81 mg total) by mouth daily.  Marland Kitchen atorvastatin (LIPITOR) 20 MG tablet Take 20 mg by mouth every evening.   . clopidogrel (PLAVIX) 75 MG tablet Take 1 tablet (75 mg total) by mouth daily with breakfast.  . Docusate Sodium (COLACE PO) Take 2 tablets by mouth daily as needed (constipation).   . fluticasone (FLONASE) 50 MCG/ACT nasal spray Place 1 spray into both nostrils 2 (two) times daily.  . furosemide (LASIX) 40 MG tablet Take 1 tablet (40 mg total) by mouth 2 (two)  times daily.  Marland Kitchen latanoprost (XALATAN) 0.005 % ophthalmic solution Place 1 drop into both eyes at bedtime.  Marland Kitchen levothyroxine (SYNTHROID, LEVOTHROID) 112 MCG tablet Take 112 mcg by mouth daily before breakfast.  . losartan (COZAAR) 25 MG tablet Take 1 tablet (25 mg total) by mouth daily. HOLD UNTIL ADVISED BY CARDIOLOGY  . Magnesium 250 MG TABS Take 1 tablet by mouth 2 (two) times daily.  . Methylcellulose, Laxative, (CITRUCEL PO) Take by mouth as directed. Take 1 tablespoon at night  . metoprolol tartrate (LOPRESSOR) 25 MG tablet Take 0.5 tablets (12.5 mg total) by mouth every evening.  .  multivitamin (THERAGRAN) per tablet Take 1 tablet by mouth at bedtime.   . nitroGLYCERIN (NITROSTAT) 0.4 MG SL tablet Place 0.4 mg under the tongue every 5 (five) minutes x 3 doses as needed. For chest pain  . potassium chloride SA (K-DUR,KLOR-CON) 20 MEQ tablet Take 1 tablet (20 mEq total) by mouth 2 (two) times daily.  Marland Kitchen pyridOXINE (VITAMIN B-6) 100 MG tablet Take 100 mg by mouth daily.   . silver sulfADIAZINE (SILVADENE) 1 % cream Apply 1 application topically 2 (two) times daily.  . Tamsulosin HCl (FLOMAX) 0.4 MG CAPS Take 0.4 mg by mouth daily.   . [DISCONTINUED] furosemide (LASIX) 40 MG tablet Take 1 tablet (40 mg total) by mouth 2 (two) times daily.  . [DISCONTINUED] potassium chloride SA (K-DUR,KLOR-CON) 20 MEQ tablet Take 1 tablet (20 mEq total) by mouth 2 (two) times daily.     Allergies:   Other and Propylene glycol   Social History   Tobacco Use  . Smoking status: Never Smoker  . Smokeless tobacco: Never Used  Substance Use Topics  . Alcohol use: Yes    Alcohol/week: 0.6 oz    Types: 1 Cans of beer per week  . Drug use: No     Family Hx: The patient's family history includes Heart attack in his mother; Heart attack (age of onset: 2) in his son; Heart disease in his father and mother; Hyperlipidemia in his father; Hypertension in his father. There is no history of Colon cancer.  ROS:   Please see the history of present illness.    Review of Systems  Cardiovascular: Positive for leg swelling.  Hematologic/Lymphatic: Bruises/bleeds easily.   All other systems reviewed and are negative.   EKGs/Labs/Other Test Reviewed:    EKG:  EKG is not ordered today.   Recent Labs: 08/07/2017: NT-Pro BNP 2,488 12/28/2017: ALT 36; BUN 30; Creatinine, Ser 1.24; Hemoglobin 9.6; Platelets 128; Potassium 4.5; Sodium 138   Recent Lipid Panel Lab Results  Component Value Date/Time   CHOL 143 08/06/2006 08:14 AM   TRIG 141 08/06/2006 08:14 AM   HDL 29.7 (L) 08/06/2006 08:14 AM    CHOLHDL 4.8 CALC 08/06/2006 08:14 AM   LDLCALC 85 08/06/2006 08:14 AM    Physical Exam:    VS:  BP 114/66   Pulse 80   Ht _0  (1.727 m)   Wt 188 lb (85.3 kg)   SpO2 97%   BMI 28.59 kg/m     Wt Readings from Last 3 Encounters:  02/04/18 188 lb (85.3 kg)  01/23/18 190 lb (86.2 kg)  01/18/18 190 lb (86.2 kg)     Physical Exam  Constitutional: He is oriented to person, place, and time. He appears well-developed and well-nourished. No distress.  HENT:  Head: Normocephalic and atraumatic.  Neck: Neck supple. No JVD present.  Cardiovascular: Normal rate, regular rhythm, S1 normal and S2  normal.  Murmur heard.  Holosystolic murmur is present with a grade of 2/6 at the upper right sternal border and lower right sternal border. Pulmonary/Chest: Breath sounds normal. He has no rales.  Abdominal: Soft. There is no hepatomegaly.  Musculoskeletal: He exhibits edema (trace-1+ bilat LE edema).  Neurological: He is alert and oriented to person, place, and time.  Skin: Skin is warm and dry.    ASSESSMENT & PLAN:    Chronic combined systolic and diastolic CHF (congestive heart failure) (HCC)  EF 20-25.  He is NYHA 2 b.  His volume status has improved since his Lasix was adjusted.  Continue current therapy.  Unfortunately, we have had to cut back on most of his heart failure medications due to low blood pressure.  He is currently only taking Metoprolol Tartrate 12.5 mg QD.  His blood pressure appears stable and he denies any further syncope or near syncope.  If he continues to do well, consider lisinopril 2.5 mg or Spironolactone 12.5 mg at follow-up.  Coronary artery disease involving coronary bypass graft of native heart without angina pectoris Prior history of CABG and subsequent PCI of the SVG-OM1, drug-eluting stent x 2 to the SVG-RCA in December 2018 and balloon angioplasty to the SVG-RCA (in [place]) 10/2017.  He denies chest pain.  His breathing has improved with diuresis and increased  blood counts.  Continue aspirin, Plavix, statin.  Severe Aortic Stenosis S/P TAVR (transcatheter aortic valve replacement) Continue SBE prophylaxis.  Vasovagal syncope Syncope seemed to be related to orthostatic hypotension.  Blood pressure is stable.  He denies any recurrent symptoms.   Dispo:  Return in about 1 month (around 03/06/2018) for Close Follow Up, w/ Dr. Burt Knack, or [name], PA-C.   Medication Adjustments/Labs and Tests Ordered: Current medicines are reviewed at length with the patient today.  Concerns regarding medicines are outlined above.  Tests Ordered: Orders Placed This Encounter  Procedures  . Basic Metabolic Panel (BMET)   Medication Changes: Meds ordered this encounter  Medications  . furosemide (LASIX) 40 MG tablet    Sig: Take 1 tablet (40 mg total) by mouth 2 (two) times daily.    Dispense:  180 tablet    Refill:  3  . potassium chloride SA (K-DUR,KLOR-CON) 20 MEQ tablet    Sig: Take 1 tablet (20 mEq total) by mouth 2 (two) times daily.    Dispense:  180 tablet    Refill:  3    Signed, [name], PA-C  02/04/2018 12:57 PM    [address], [place], [place]  [address] Phone: [phone]; Fax: [phone]

## 2018-02-04 ENCOUNTER — Ambulatory Visit: Payer: Medicare HMO | Admitting: Physician Assistant

## 2018-02-04 ENCOUNTER — Encounter: Payer: Self-pay | Admitting: Physician Assistant

## 2018-02-04 VITALS — BP 114/66 | HR 80 | Ht 68.0 in | Wt 188.0 lb

## 2018-02-04 DIAGNOSIS — I25810 Atherosclerosis of coronary artery bypass graft(s) without angina pectoris: Secondary | ICD-10-CM

## 2018-02-04 DIAGNOSIS — R55 Syncope and collapse: Secondary | ICD-10-CM | POA: Diagnosis not present

## 2018-02-04 DIAGNOSIS — Z952 Presence of prosthetic heart valve: Secondary | ICD-10-CM | POA: Diagnosis not present

## 2018-02-04 DIAGNOSIS — I5042 Chronic combined systolic (congestive) and diastolic (congestive) heart failure: Secondary | ICD-10-CM | POA: Diagnosis not present

## 2018-02-04 MED ORDER — FUROSEMIDE 40 MG PO TABS: 40.0000 mg | ORAL_TABLET | Freq: Two times a day (BID) | ORAL | 3 refills | Status: DC

## 2018-02-04 MED ORDER — POTASSIUM CHLORIDE CRYS ER 20 MEQ PO TBCR: 20.0000 meq | EXTENDED_RELEASE_TABLET | Freq: Two times a day (BID) | ORAL | 3 refills | Status: DC

## 2018-02-04 NOTE — Patient Instructions (Addendum)
Medication Instructions:  1. REFILLS HAVE BEEN SENT IN FOR LASIX AND POTASSIUM   2. CONTINUE ALL MEDICATIONS AS PRESCRIBED  Labwork: TODAY BMET  Testing/Procedures: NONE ORDERED TODAY  Follow-Up: [name], [facility] 03/11/18 @ 11:15   Any Other Special Instructions Will Be Listed Below (If Applicable).     If you need a refill on your cardiac medications before your next appointment, please call your pharmacy.

## 2018-02-05 LAB — BASIC METABOLIC PANEL
BUN / CREAT RATIO: 20 (ref 10–24)
BUN: 28 mg/dL — AB (ref 8–27)
CALCIUM: 9.7 mg/dL (ref 8.6–10.2)
CHLORIDE: 99 mmol/L (ref 96–106)
CO2: 28 mmol/L (ref 20–29)
Creatinine, Ser: 1.38 mg/dL — ABNORMAL HIGH (ref 0.76–1.27)
GFR calc non Af Amer: 47 mL/min/{1.73_m2} — ABNORMAL LOW (ref >59)
GFR, EST AFRICAN AMERICAN: 54 mL/min/{1.73_m2} — AB (ref >59)
GLUCOSE: 98 mg/dL (ref 65–99)
POTASSIUM: 4.3 mmol/L (ref 3.5–5.2)
Sodium: 143 mmol/L (ref 134–144)

## 2018-02-06 ENCOUNTER — Encounter (INDEPENDENT_AMBULATORY_CARE_PROVIDER_SITE_OTHER): Payer: Self-pay | Admitting: Orthopaedic Surgery

## 2018-02-06 ENCOUNTER — Telehealth: Payer: Self-pay | Admitting: *Deleted

## 2018-02-06 ENCOUNTER — Ambulatory Visit (INDEPENDENT_AMBULATORY_CARE_PROVIDER_SITE_OTHER): Payer: Medicare HMO | Admitting: Orthopaedic Surgery

## 2018-02-06 DIAGNOSIS — M25561 Pain in right knee: Secondary | ICD-10-CM

## 2018-02-06 DIAGNOSIS — I5042 Chronic combined systolic (congestive) and diastolic (congestive) heart failure: Secondary | ICD-10-CM

## 2018-02-06 MED ORDER — POTASSIUM CHLORIDE CRYS ER 20 MEQ PO TBCR: 20.0000 meq | EXTENDED_RELEASE_TABLET | ORAL | 3 refills | Status: DC

## 2018-02-06 MED ORDER — FUROSEMIDE 40 MG PO TABS: 60.0000 mg | ORAL_TABLET | Freq: Every day | ORAL | 3 refills | Status: DC

## 2018-02-06 NOTE — Telephone Encounter (Signed)
-----   Message from Liliane Shi, [place] sent at 02/05/2018  4:49 PM EDT ----- Creatinine has increased somewhat.  Potassium is normal. PLAN: 1.  Decrease Lasix to 60 mg daily 2.  Decrease potassium to 20 mEq in the morning and 10 mEq in evening 3.  BMET 1 week [name], PA-C    02/05/2018 4:47 PM
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
## 2018-02-06 NOTE — Telephone Encounter (Signed)
Pt called back and has been notified of lab results and medication dose changes. Pt is agreeable to decrease lasix to 60 mg daily, decrease K+ to 20 meq in the AM and 10 meq in the PM. BMET 6/25 when he comes in for his echo. Pt verbalized understanding to plan of care with verbal read back. I will forward results to Dr. [name], PCP.

## 2018-02-06 NOTE — Telephone Encounter (Signed)
Left message to go over lab results and recommendations.  

## 2018-02-12 ENCOUNTER — Encounter: Payer: Self-pay | Admitting: Physician Assistant

## 2018-02-12 ENCOUNTER — Other Ambulatory Visit: Payer: Self-pay

## 2018-02-12 ENCOUNTER — Ambulatory Visit (HOSPITAL_COMMUNITY): Payer: Medicare HMO | Attending: Cardiovascular Disease

## 2018-02-12 ENCOUNTER — Other Ambulatory Visit: Payer: Medicare HMO

## 2018-02-12 DIAGNOSIS — I5042 Chronic combined systolic (congestive) and diastolic (congestive) heart failure: Secondary | ICD-10-CM | POA: Insufficient documentation

## 2018-02-12 DIAGNOSIS — I081 Rheumatic disorders of both mitral and tricuspid valves: Secondary | ICD-10-CM | POA: Insufficient documentation

## 2018-02-12 DIAGNOSIS — G629 Polyneuropathy, unspecified: Secondary | ICD-10-CM | POA: Diagnosis not present

## 2018-02-12 DIAGNOSIS — E039 Hypothyroidism, unspecified: Secondary | ICD-10-CM | POA: Diagnosis not present

## 2018-02-12 DIAGNOSIS — Z953 Presence of xenogenic heart valve: Secondary | ICD-10-CM | POA: Diagnosis not present

## 2018-02-12 DIAGNOSIS — I2510 Atherosclerotic heart disease of native coronary artery without angina pectoris: Secondary | ICD-10-CM | POA: Insufficient documentation

## 2018-02-12 LAB — BASIC METABOLIC PANEL
BUN / CREAT RATIO: 22 (ref 10–24)
BUN: 30 mg/dL — ABNORMAL HIGH (ref 8–27)
CHLORIDE: 99 mmol/L (ref 96–106)
CO2: 26 mmol/L (ref 20–29)
Calcium: 9.7 mg/dL (ref 8.6–10.2)
Creatinine, Ser: 1.37 mg/dL — ABNORMAL HIGH (ref 0.76–1.27)
GFR calc non Af Amer: 47 mL/min/{1.73_m2} — ABNORMAL LOW (ref >59)
GFR, EST AFRICAN AMERICAN: 54 mL/min/{1.73_m2} — AB (ref >59)
Glucose: 101 mg/dL — ABNORMAL HIGH (ref 65–99)
POTASSIUM: 4.2 mmol/L (ref 3.5–5.2)
SODIUM: 141 mmol/L (ref 134–144)

## 2018-02-18 ENCOUNTER — Telehealth: Payer: Self-pay | Admitting: Physician Assistant

## 2018-02-18 NOTE — Telephone Encounter (Signed)
Call should had been routed to Triage for further evaluation. I will route to Triage.

## 2018-02-18 NOTE — Telephone Encounter (Signed)
Reviewed with Dr. Burt Knack. Will instruct patient to: 1) INCREASE LASIX to 80 BID for 2 days then take 80 mg daily 2) INCREASE KDUR to 20 meq BID 3) Repeat BMET  Attempted to call patient. Mailbox is full - unable to leave message. Will try again in the AM.

## 2018-02-18 NOTE — Telephone Encounter (Signed)
Pt c/o swelling: STAT is pt has developed SOB within 24 hours  1) How much weight have you gained and in what time span? 5lb 2-3days  2) If swelling, where is the swelling located? ankles  3) Are you currently taking a fluid pill? yes  4) Are you currently SOB? No  5) Do you have a log of your daily weights (if so, list)? No  6) Have you gained 3 pounds in a day or 5 pounds in a week? Yes  7) Have you traveled recently? No

## 2018-02-18 NOTE — Telephone Encounter (Signed)
Pt has been having swelling in ankles for a few days.  Denies SOB, CP, dizziness or other cardiac sx.  Weight went from 186 to 193 over the last 4-5 days.  Denies increased sodium in diet and actually says he is eating less.  BP usually 100-120/45-65, HR normal (pt didn't have numbers).  Swelling improves some at night but comes right back once he gets up.  Pt normally takes Furosemide 60mg  QD.  Pt did take 80mg  yesterday and today.  Pt states he can "feel when he has fluid".  Advised I will send to Dr. Burt Knack for review.

## 2018-02-19 ENCOUNTER — Ambulatory Visit (INDEPENDENT_AMBULATORY_CARE_PROVIDER_SITE_OTHER): Payer: Medicare HMO

## 2018-02-19 DIAGNOSIS — Z950 Presence of cardiac pacemaker: Secondary | ICD-10-CM

## 2018-02-19 DIAGNOSIS — I5042 Chronic combined systolic (congestive) and diastolic (congestive) heart failure: Secondary | ICD-10-CM

## 2018-02-19 NOTE — Telephone Encounter (Signed)
Attempted to call patient. Mailbox is full - unable to leave message. Will try again tomorrow.

## 2018-02-20 MED ORDER — POTASSIUM CHLORIDE CRYS ER 20 MEQ PO TBCR: 20.0000 meq | EXTENDED_RELEASE_TABLET | Freq: Two times a day (BID) | ORAL | 3 refills | Status: DC

## 2018-02-20 MED ORDER — FUROSEMIDE 40 MG PO TABS: ORAL_TABLET | ORAL | 3 refills | Status: DC

## 2018-02-20 NOTE — Telephone Encounter (Signed)
Spoke with patient for ICM remote transmission follow up.  Provided instructions given by Dr Burt Knack on 02/18/2018 due to Harland German has not been able to reach patient.   Instructed patient to INCREASE Lasix to 80 mg bid for 2 days and then take 80 mg daily.  INCREASE KDUR to 20 mEq to bid.  Repeat BMET in 2 weeks.  Patient repeated instructions back correctly.  He will have labs drawn at [facility] on [address]na [address] on 03/06/2018. Instructed patient to call back if he has any difficulty with change in medication or if symptoms persist.  Med change in medical record and order placed for BMET.

## 2018-02-20 NOTE — Progress Notes (Signed)
EPIC Encounter for ICM Monitoring  Patient Name: [name] is a 84 y.o. male Date: 02/20/2018 Primary Care Physican: [name], MD Primary Cardiologist:Cooper/Weaver, PA Electrophysiologist:Allred Dry Weight:193lbs   Clinical Status (13-Feb-2018 to 19-Feb-2018) Bi-V Pacing:85.4%  Clinical Status (13-Feb-2018 to 19-Feb-2018)  AT/AF 272 episodes   Time in AT/AF 23.7 hr/day (98.8%)   Observations (3) (13-Feb-2018 to 19-Feb-2018)  AT/AF >= 6 hr for 7 days.  V. Pacing less than 90%.  CRT Pacing is effective less than 90% of the time.       Heart Failure questions reviewed, pt said he has not been feeling well and knows he has fluid.  He called [facility] triage 7/1 to report weight gain of 2-3 pounds within 4-5days and swelling of ankles.  Dr Burt Knack provided instructions and Creed Copper attempted to call patient to provide change in meds (see phone note 02/18/2018).  Patient has not noticed he has not been in NSR over the last 2 weeks.  He has not missed any medication dosages.     Patient is difficult to reach due to he does not have cell phone coverage at his home and plans on getting a land line.    Thoracic impedance abnormal suggesting fluid accumulation starting 02/06/2018 which is close to time the report suggests AT/AF started ~02/02/2018.        Prescribed dosage: Furosemide 40 mg Take 1.5 tablets (60 mg total) by mouth daily.  Potassium to 20 mEq in the morning and 10 mEq in evening  Recommendations:  Provided patient with Dr Antionette Char 02/18/2018 instructions (see 7/1 phone note) to increase Furosemide to 80 mg bid for 2 days and then start 80 mg daily.  Also increase Potassium to 20 mEq 1 tablet twice a day.  BMET in 2 weeks.  Patient repeated instructions back correctly.  He will have labs drawn at [place] on [address], Linna Hoff.  Updated Harland German on patient was reached and provided instructions.    Follow-up plan: ICM clinic phone appointment on  03/08/2018 to recheck fluid levels before 7/22 office visit.  Office appointment 02/27/2018 with Dr. [name] and 03/11/2018 with [name], PA.   Copy of ICM check sent to Dr. [name] and Dr. Burt Knack and [name].   3 month ICM trend: 02/19/2018    AT/AF   1 Year ICM trend:       Rosalene Billings, RN 02/20/2018 10:21 AM

## 2018-02-27 ENCOUNTER — Ambulatory Visit: Payer: Medicare HMO | Admitting: Internal Medicine

## 2018-02-27 ENCOUNTER — Encounter: Payer: Self-pay | Admitting: Internal Medicine

## 2018-02-27 ENCOUNTER — Inpatient Hospital Stay (HOSPITAL_COMMUNITY): Payer: Medicare HMO | Attending: Hematology

## 2018-02-27 VITALS — BP 126/66 | HR 95 | Ht 68.0 in | Wt 167.0 lb

## 2018-02-27 DIAGNOSIS — I441 Atrioventricular block, second degree: Secondary | ICD-10-CM

## 2018-02-27 DIAGNOSIS — I5042 Chronic combined systolic (congestive) and diastolic (congestive) heart failure: Secondary | ICD-10-CM

## 2018-02-27 DIAGNOSIS — I4729 Other ventricular tachycardia: Secondary | ICD-10-CM

## 2018-02-27 DIAGNOSIS — I472 Ventricular tachycardia: Secondary | ICD-10-CM | POA: Diagnosis not present

## 2018-02-27 DIAGNOSIS — D649 Anemia, unspecified: Secondary | ICD-10-CM | POA: Diagnosis not present

## 2018-02-27 DIAGNOSIS — I10 Essential (primary) hypertension: Secondary | ICD-10-CM | POA: Insufficient documentation

## 2018-02-27 DIAGNOSIS — Z950 Presence of cardiac pacemaker: Secondary | ICD-10-CM

## 2018-02-27 LAB — CBC WITH DIFFERENTIAL/PLATELET
Basophils Absolute: 0 10*3/uL (ref 0.0–0.1)
Basophils Relative: 0 %
EOS ABS: 0.2 10*3/uL (ref 0.0–0.7)
Eosinophils Relative: 3 %
HCT: 34.2 % — ABNORMAL LOW (ref 39.0–52.0)
HEMOGLOBIN: 10.7 g/dL — AB (ref 13.0–17.0)
LYMPHS ABS: 1 10*3/uL (ref 0.7–4.0)
LYMPHS PCT: 17 %
MCH: 31.3 pg (ref 26.0–34.0)
MCHC: 31.3 g/dL (ref 30.0–36.0)
MCV: 100 fL (ref 78.0–100.0)
Monocytes Absolute: 0.8 10*3/uL (ref 0.1–1.0)
Monocytes Relative: 13 %
NEUTROS PCT: 67 %
Neutro Abs: 3.9 10*3/uL (ref 1.7–7.7)
Platelets: 131 10*3/uL — ABNORMAL LOW (ref 150–400)
RBC: 3.42 MIL/uL — AB (ref 4.22–5.81)
RDW: 18.1 % — ABNORMAL HIGH (ref 11.5–15.5)
WBC: 5.8 10*3/uL (ref 4.0–10.5)

## 2018-02-27 LAB — COMPREHENSIVE METABOLIC PANEL
ALT: 19 U/L (ref 0–44)
AST: 34 U/L (ref 15–41)
Albumin: 4 g/dL (ref 3.5–5.0)
Alkaline Phosphatase: 91 U/L (ref 38–126)
Anion gap: 11 (ref 5–15)
BUN: 28 mg/dL — ABNORMAL HIGH (ref 8–23)
CHLORIDE: 100 mmol/L (ref 98–111)
CO2: 29 mmol/L (ref 22–32)
CREATININE: 1.29 mg/dL — AB (ref 0.61–1.24)
Calcium: 9.2 mg/dL (ref 8.9–10.3)
GFR, EST AFRICAN AMERICAN: 57 mL/min — AB (ref >60)
GFR, EST NON AFRICAN AMERICAN: 49 mL/min — AB (ref >60)
Glucose, Bld: 106 mg/dL — ABNORMAL HIGH (ref 70–99)
Potassium: 4.1 mmol/L (ref 3.5–5.1)
SODIUM: 140 mmol/L (ref 135–145)
Total Bilirubin: 0.7 mg/dL (ref 0.3–1.2)
Total Protein: 7.3 g/dL (ref 6.5–8.1)

## 2018-02-27 LAB — FERRITIN: FERRITIN: 198 ng/mL (ref 24–336)

## 2018-02-27 LAB — IRON AND TIBC
Iron: 44 ug/dL — ABNORMAL LOW (ref 45–182)
SATURATION RATIOS: 16 % — AB (ref 17.9–39.5)
TIBC: 283 ug/dL (ref 250–450)
UIBC: 239 ug/dL

## 2018-02-27 MED ORDER — APIXABAN 5 MG PO TABS: 5.0000 mg | ORAL_TABLET | Freq: Two times a day (BID) | ORAL | 10 refills | Status: DC

## 2018-02-27 MED ORDER — APIXABAN 5 MG PO TABS: 5.0000 mg | ORAL_TABLET | Freq: Two times a day (BID) | ORAL | 0 refills | Status: DC

## 2018-02-27 NOTE — Patient Instructions (Addendum)
Medication Instructions:  Your physician has recommended you make the following change in your medication:  1.  Stop aspirin 2.  Stop Plavix 3.  Start taking Eliquis 5 mg one tablet by mouth twice a day  Labwork: None ordered.  Testing/Procedures: None ordered.  Follow-Up: Your physician wants you to follow-up in: 4 weeks with Chanetta Marshall, NP.  Your physician wants you to follow-up in: 8 weeks with Dr. [name].  Remote monitoring is used to monitor your Pacemaker from home. This monitoring reduces the number of office visits required to check your device to one time per year. It allows [place] to keep an eye on the functioning of your device to ensure it is working properly. You are scheduled for a device check from home on 03/08/2018. You may send your transmission at any time that day. If you have a wireless device, the transmission will be sent automatically. After your physician reviews your transmission, you will receive a postcard with your next transmission date.  Any Other Special Instructions Will Be Listed Below (If Applicable).  If you need a refill on your cardiac medications before your next appointment, please call your pharmacy.   Apixaban oral tablets What is this medicine? APIXABAN (a PIX a ban) is an anticoagulant (blood thinner). It is used to lower the chance of stroke in people with a medical condition called atrial fibrillation. It is also used to treat or prevent blood clots in the lungs or in the veins. This medicine may be used for other purposes; ask your health care provider or pharmacist if you have questions. COMMON BRAND NAME(S): Eliquis What should I tell my health care provider before I take this medicine? They need to know if you have any of these conditions: -bleeding disorders -bleeding in the brain -blood in your stools (black or tarry stools) or if you have blood in your vomit -history of stomach bleeding -kidney disease -liver disease -mechanical  heart valve -an unusual or allergic reaction to apixaban, other medicines, foods, dyes, or preservatives -pregnant or trying to get pregnant -breast-feeding How should I use this medicine? Take this medicine by mouth with a glass of water. Follow the directions on the prescription label. You can take it with or without food. If it upsets your stomach, take it with food. Take your medicine at regular intervals. Do not take it more often than directed. Do not stop taking except on your doctor's advice. Stopping this medicine may increase your risk of a blot clot. Be sure to refill your prescription before you run out of medicine. Talk to your pediatrician regarding the use of this medicine in children. Special care may be needed. Overdosage: If you think you have taken too much of this medicine contact a poison control center or emergency room at once. NOTE: This medicine is only for you. Do not share this medicine with others. What if I miss a dose? If you miss a dose, take it as soon as you can. If it is almost time for your next dose, take only that dose. Do not take double or extra doses. What may interact with this medicine? This medicine may interact with the following: -aspirin and aspirin-like medicines -certain medicines for fungal infections like ketoconazole and itraconazole -certain medicines for seizures like carbamazepine and phenytoin -certain medicines that treat or prevent blood clots like warfarin, enoxaparin, and dalteparin -clarithromycin -NSAIDs, medicines for pain and inflammation, like ibuprofen or naproxen -rifampin -ritonavir -St. John's wort This list may not describe  all possible interactions. Give your health care provider a list of all the medicines, herbs, non-prescription drugs, or dietary supplements you use. Also tell them if you smoke, drink alcohol, or use illegal drugs. Some items may interact with your medicine. What should I watch for while using this  medicine? Visit your doctor or health care professional for regular checks on your progress. Notify your doctor or health care professional and seek emergency treatment if you develop breathing problems; changes in vision; chest pain; severe, sudden headache; pain, swelling, warmth in the leg; trouble speaking; sudden numbness or weakness of the face, arm or leg. These can be signs that your condition has gotten worse. If you are going to have surgery or other procedure, tell your doctor that you are taking this medicine. What side effects may I notice from receiving this medicine? Side effects that you should report to your doctor or health care professional as soon as possible: -allergic reactions like skin rash, itching or hives, swelling of the face, lips, or tongue -signs and symptoms of bleeding such as bloody or black, tarry stools; red or dark-brown urine; spitting up blood or brown material that looks like coffee grounds; red spots on the skin; unusual bruising or bleeding from the eye, gums, or nose This list may not describe all possible side effects. Call your doctor for medical advice about side effects. You may report side effects to FDA at 1-800-FDA-1088. Where should I keep my medicine? Keep out of the reach of children. Store at room temperature between 20 and 25 degrees C (68 and 77 degrees F). Throw away any unused medicine after the expiration date. NOTE: This sheet is a summary. It may not cover all possible information. If you have questions about this medicine, talk to your doctor, pharmacist, or health care provider.  2018 Elsevier/Gold Standard (2016-02-28 11:54:23)
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
## 2018-03-01 ENCOUNTER — Inpatient Hospital Stay (HOSPITAL_BASED_OUTPATIENT_CLINIC_OR_DEPARTMENT_OTHER): Payer: Medicare HMO | Admitting: Hematology

## 2018-03-01 ENCOUNTER — Encounter (HOSPITAL_COMMUNITY): Payer: Self-pay | Admitting: Hematology

## 2018-03-01 ENCOUNTER — Other Ambulatory Visit: Payer: Self-pay

## 2018-03-01 VITALS — BP 125/52 | HR 83 | Resp 18

## 2018-03-01 DIAGNOSIS — I10 Essential (primary) hypertension: Secondary | ICD-10-CM

## 2018-03-01 DIAGNOSIS — D649 Anemia, unspecified: Secondary | ICD-10-CM | POA: Diagnosis not present
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
## 2018-03-01 NOTE — Assessment & Plan Note (Signed)
1.  Normocytic anemia: - Normocytic anemia from combination of CKD and relative iron deficiency state.  Could not tolerate iron pills in the past secondary to constipation.  Denies any bleeding per rectum or melena.  [name] is on Plavix for many years.  -Last colonoscopy was in April 2013, a tubular adenoma was removed. - Stool for occult blood checked in our office was negative.  SPEP was negative.   -Received Feraheme infusion on 01/22/2018 and 01/29/2018.  Hemoglobin improved from 9.6-10.7.  Ferritin improved from 47-1 98.  Percent saturation is still low at 16.  Hence I have recommended 2 more infusions of Feraheme.  [name] felt better for 5 days after last infusions.  I will see him back in 2 months for follow-up with repeat blood work.  We will consider erythropoiesis stimulating agents if there is no improvement.

## 2018-03-04 LAB — CUP PACEART INCLINIC DEVICE CHECK
Brady Statistic AP VP Percent: 7.2 %
Brady Statistic AP VS Percent: 0.18 %
Brady Statistic AS VP Percent: 86.51 %
Implantable Lead Implant Date: 20170907
Implantable Lead Location: 753860
Implantable Lead Model: 5076
Lead Channel Impedance Value: 285 Ohm
Lead Channel Impedance Value: 418 Ohm
Lead Channel Impedance Value: 513 Ohm
Lead Channel Impedance Value: 513 Ohm
Lead Channel Impedance Value: 570 Ohm
Lead Channel Impedance Value: 646 Ohm
Lead Channel Pacing Threshold Pulse Width: 0.4 ms
Lead Channel Pacing Threshold Pulse Width: 0.8 ms
Lead Channel Sensing Intrinsic Amplitude: 0.625 mV
Lead Channel Sensing Intrinsic Amplitude: 11.25 mV
Lead Channel Setting Pacing Amplitude: 2 V
Lead Channel Setting Pacing Amplitude: 2.25 V
Lead Channel Setting Pacing Pulse Width: 0.4 ms
Lead Channel Setting Pacing Pulse Width: 0.8 ms
Lead Channel Setting Sensing Sensitivity: 0.9 mV
MDC IDC LEAD IMPLANT DT: 20170907
MDC IDC LEAD IMPLANT DT: 20170907
MDC IDC LEAD LOCATION: 753858
MDC IDC LEAD LOCATION: 753859
MDC IDC MSMT BATTERY REMAINING LONGEVITY: 82 mo
MDC IDC MSMT BATTERY VOLTAGE: 2.98 V
MDC IDC MSMT LEADCHNL LV IMPEDANCE VALUE: 399 Ohm
MDC IDC MSMT LEADCHNL LV IMPEDANCE VALUE: 608 Ohm
MDC IDC MSMT LEADCHNL LV PACING THRESHOLD AMPLITUDE: 1.25 V
MDC IDC MSMT LEADCHNL RA IMPEDANCE VALUE: 418 Ohm
MDC IDC MSMT LEADCHNL RA PACING THRESHOLD AMPLITUDE: 0.75 V
MDC IDC MSMT LEADCHNL RV IMPEDANCE VALUE: 304 Ohm
MDC IDC MSMT LEADCHNL RV PACING THRESHOLD AMPLITUDE: 1.25 V
MDC IDC MSMT LEADCHNL RV PACING THRESHOLD PULSEWIDTH: 0.4 ms
MDC IDC PG IMPLANT DT: 20170907
MDC IDC SESS DTM: 20190710201310
MDC IDC SET LEADCHNL RV PACING AMPLITUDE: 2.5 V
MDC IDC STAT BRADY AS VS PERCENT: 6 %
MDC IDC STAT BRADY RA PERCENT PACED: 8.26 %
MDC IDC STAT BRADY RV PERCENT PACED: 92.08 %

## 2018-03-06 ENCOUNTER — Telehealth: Payer: Self-pay | Admitting: Physician Assistant

## 2018-03-06 DIAGNOSIS — I5022 Chronic systolic (congestive) heart failure: Secondary | ICD-10-CM | POA: Diagnosis not present

## 2018-03-06 DIAGNOSIS — M109 Gout, unspecified: Secondary | ICD-10-CM | POA: Diagnosis not present

## 2018-03-06 DIAGNOSIS — E039 Hypothyroidism, unspecified: Secondary | ICD-10-CM | POA: Diagnosis not present

## 2018-03-06 DIAGNOSIS — I2510 Atherosclerotic heart disease of native coronary artery without angina pectoris: Secondary | ICD-10-CM | POA: Diagnosis not present

## 2018-03-06 DIAGNOSIS — D649 Anemia, unspecified: Secondary | ICD-10-CM | POA: Diagnosis not present

## 2018-03-06 DIAGNOSIS — I5043 Acute on chronic combined systolic (congestive) and diastolic (congestive) heart failure: Secondary | ICD-10-CM

## 2018-03-06 NOTE — Telephone Encounter (Signed)
Sent orders to [facility], pt aware.

## 2018-03-06 NOTE — Telephone Encounter (Signed)
New Message    Jessica at [facility] states pt is at [facility] in [place] to get labs but no orders

## 2018-03-07 ENCOUNTER — Other Ambulatory Visit: Payer: Self-pay | Admitting: Physician Assistant

## 2018-03-07 ENCOUNTER — Inpatient Hospital Stay (HOSPITAL_COMMUNITY): Payer: Medicare HMO

## 2018-03-07 VITALS — BP 114/56 | HR 83 | Temp 98.0°F | Resp 18

## 2018-03-07 DIAGNOSIS — D649 Anemia, unspecified: Secondary | ICD-10-CM

## 2018-03-07 DIAGNOSIS — I10 Essential (primary) hypertension: Secondary | ICD-10-CM | POA: Diagnosis not present

## 2018-03-07 MED ORDER — SODIUM CHLORIDE 0.9 % IV SOLN
510.0000 mg | Freq: Once | INTRAVENOUS | Status: AC
  Administered 2018-03-07: 510 mg via INTRAVENOUS
  Filled 2018-03-07: qty 17

## 2018-03-07 NOTE — Patient Instructions (Signed)
[place] at [facility]  Discharge Instructions:  Today you received your feraheme infusion. Follow up as scheduled. Call clinic for any questions or concerns.  _______________________________________________________________  Thank you for choosing [place] at [facility] to provide your oncology and hematology care.  To afford each patient quality time with our providers, please arrive at least 15 minutes before your scheduled appointment.  You need to re-schedule your appointment if you arrive 10 or more minutes late.  We strive to give you quality time with our providers, and arriving late affects you and other patients whose appointments are after yours.  Also, if you no show three or more times for appointments you may be dismissed from the clinic.  Again, thank you for choosing [facility] at [place] hope is that these requests will allow you access to exceptional care and in a timely manner. _______________________________________________________________  If you have questions after your visit, please contact our office at [phone] between the hours of 8:30 a.m. and 5:00 p.m. Voicemails left after 4:30 p.m. will not be returned until the following business day. _______________________________________________________________  For prescription refill requests, have your pharmacy contact our office. _______________________________________________________________  Recommendations made by the consultant and any test results will be sent to your referring physician. _______________________________________________________________

## 2018-03-07 NOTE — Progress Notes (Signed)
[name] presents today for feraheme infusion. IV started with positive blood return. Tolerated Feraheme infusion well without incident or complaint. VSS upon completion of treatment. Discharged via wheelchair in company of wife.

## 2018-03-08 ENCOUNTER — Ambulatory Visit (INDEPENDENT_AMBULATORY_CARE_PROVIDER_SITE_OTHER): Payer: Medicare HMO

## 2018-03-08 ENCOUNTER — Telehealth: Payer: Self-pay

## 2018-03-08 ENCOUNTER — Ambulatory Visit (INDEPENDENT_AMBULATORY_CARE_PROVIDER_SITE_OTHER): Payer: Medicare HMO | Admitting: *Deleted

## 2018-03-08 DIAGNOSIS — I5042 Chronic combined systolic (congestive) and diastolic (congestive) heart failure: Secondary | ICD-10-CM

## 2018-03-08 DIAGNOSIS — Z950 Presence of cardiac pacemaker: Secondary | ICD-10-CM

## 2018-03-08 DIAGNOSIS — I441 Atrioventricular block, second degree: Secondary | ICD-10-CM

## 2018-03-08 LAB — COMPREHENSIVE METABOLIC PANEL
ALBUMIN: 4.2 g/dL (ref 3.5–4.7)
ALK PHOS: 103 IU/L (ref 39–117)
ALT: 16 IU/L (ref 0–44)
AST: 33 IU/L (ref 0–40)
Albumin/Globulin Ratio: 1.6 (ref 1.2–2.2)
BILIRUBIN TOTAL: 0.6 mg/dL (ref 0.0–1.2)
BUN / CREAT RATIO: 20 (ref 10–24)
BUN: 28 mg/dL — AB (ref 8–27)
CALCIUM: 9.7 mg/dL (ref 8.6–10.2)
CHLORIDE: 99 mmol/L (ref 96–106)
CO2: 29 mmol/L (ref 20–29)
Creatinine, Ser: 1.38 mg/dL — ABNORMAL HIGH (ref 0.76–1.27)
GFR calc Af Amer: 54 mL/min/{1.73_m2} — ABNORMAL LOW (ref >59)
GFR calc non Af Amer: 47 mL/min/{1.73_m2} — ABNORMAL LOW (ref >59)
GLUCOSE: 97 mg/dL (ref 65–99)
Globulin, Total: 2.6 g/dL (ref 1.5–4.5)
Potassium: 4.5 mmol/L (ref 3.5–5.2)
SODIUM: 141 mmol/L (ref 134–144)
TOTAL PROTEIN: 6.8 g/dL (ref 6.0–8.5)

## 2018-03-08 LAB — CBC WITH DIFFERENTIAL/PLATELET
Basophils Absolute: 0 10*3/uL (ref 0.0–0.2)
Basos: 0 %
EOS (ABSOLUTE): 0.1 10*3/uL (ref 0.0–0.4)
EOS: 2 %
HEMATOCRIT: 33.5 % — AB (ref 37.5–51.0)
Hemoglobin: 10.8 g/dL — ABNORMAL LOW (ref 13.0–17.7)
Immature Grans (Abs): 0 10*3/uL (ref 0.0–0.1)
Immature Granulocytes: 0 %
LYMPHS ABS: 0.7 10*3/uL (ref 0.7–3.1)
Lymphs: 9 %
MCH: 30.8 pg (ref 26.6–33.0)
MCHC: 32.2 g/dL (ref 31.5–35.7)
MCV: 95 fL (ref 79–97)
MONOS ABS: 0.7 10*3/uL (ref 0.1–0.9)
Monocytes: 10 %
Neutrophils Absolute: 5.4 10*3/uL (ref 1.4–7.0)
Neutrophils: 79 %
PLATELETS: 138 10*3/uL — AB (ref 150–450)
RBC: 3.51 x10E6/uL — AB (ref 4.14–5.80)
RDW: 17.9 % — ABNORMAL HIGH (ref 12.3–15.4)
WBC: 6.9 10*3/uL (ref 3.4–10.8)

## 2018-03-08 LAB — IRON AND TIBC
IRON SATURATION: 12 % — AB (ref 15–55)
IRON: 30 ug/dL — AB (ref 38–169)
TIBC: 246 ug/dL — AB (ref 250–450)
UIBC: 216 ug/dL (ref 111–343)

## 2018-03-08 LAB — FERRITIN: Ferritin: 227 ng/mL (ref 30–400)

## 2018-03-08 LAB — VITAMIN B12: Vitamin B-12: 1185 pg/mL (ref 232–1245)

## 2018-03-08 NOTE — Telephone Encounter (Signed)
Attempted to confirm remote transmission with pt. No answer and was unable to leave a message.   

## 2018-03-11 ENCOUNTER — Encounter: Payer: Self-pay | Admitting: Cardiology

## 2018-03-11 ENCOUNTER — Encounter: Payer: Self-pay | Admitting: Physician Assistant

## 2018-03-11 ENCOUNTER — Ambulatory Visit: Payer: Medicare HMO | Admitting: Physician Assistant

## 2018-03-11 VITALS — BP 138/78 | HR 85 | Ht 68.0 in | Wt 193.0 lb

## 2018-03-11 DIAGNOSIS — I25810 Atherosclerosis of coronary artery bypass graft(s) without angina pectoris: Secondary | ICD-10-CM | POA: Diagnosis not present

## 2018-03-11 DIAGNOSIS — I5043 Acute on chronic combined systolic (congestive) and diastolic (congestive) heart failure: Secondary | ICD-10-CM | POA: Diagnosis not present

## 2018-03-11 DIAGNOSIS — Z952 Presence of prosthetic heart valve: Secondary | ICD-10-CM | POA: Diagnosis not present

## 2018-03-11 DIAGNOSIS — D649 Anemia, unspecified: Secondary | ICD-10-CM

## 2018-03-11 DIAGNOSIS — I481 Persistent atrial fibrillation: Secondary | ICD-10-CM | POA: Diagnosis not present

## 2018-03-11 DIAGNOSIS — I4819 Other persistent atrial fibrillation: Secondary | ICD-10-CM

## 2018-03-11 DIAGNOSIS — Z950 Presence of cardiac pacemaker: Secondary | ICD-10-CM

## 2018-03-11 MED ORDER — FUROSEMIDE 40 MG PO TABS: 80.0000 mg | ORAL_TABLET | ORAL | 3 refills | Status: DC

## 2018-03-11 NOTE — Progress Notes (Signed)
Remote pacemaker transmission.   

## 2018-03-11 NOTE — Addendum Note (Signed)
Addended by: Michae Kava on: 03/11/2018 02:03 PM   Modules accepted: Orders

## 2018-03-11 NOTE — Patient Instructions (Addendum)
Medication Instructions:  1. FOR THE NEXT 3 DAYS YOU WILL INCREASE LASIX TO 80 MG TWICE DAILY; AFTER THE 3 DAYS GO BACK TO LASIX 80 MG IN THE MORNING AND 40 MG IN THE PM  2. FOR THE NEXT 3 DAYS YOU WILL INCREASE POTASSIUM TO 40 MEQ IN THE MORNING AND 20 MEQ IN THE PM; AFTER THE 3 DAYS YOU WILL GO BACK TO POTASSIUM 20 MEQ TWICE DAILY   Labwork: BMET TO BE DONE IN 1 WEEK   Testing/Procedures: NONE ORDERED TODAY  Follow-Up: [facility] ON 03/26/18 @ 11:45 AM   Any Other Special Instructions Will Be Listed Below (If Applicable).     If you need a refill on your cardiac medications before your next appointment, please call your pharmacy.

## 2018-03-11 NOTE — Progress Notes (Signed)
Cardiology Office Note:    Date:  03/11/2018   ID:  [name], DOB 03-May-1931, MRN [id number]  PCP:  [name], MD  Cardiologist:  [name], MD  Electrophysiologist:  [name], MD     Referring MD: [name], MD   Chief Complaint  Patient presents with  . Follow-up    CHF    History of Present Illness:    [name] is a 84 y.o. male with coronary artery disease status post CABGin 2010.  He has undergone multiple PCI procedures since.  He underwent stenting of the vein graft to the OM1in 2017, DES to ostial and distal portions of the S-R PDA in 07/2017.  Other hx includes atrial fibrillation, aortic stenosis status post TAVR in September 2017, complete heart block status postCRT-P (Medtronic MRI compatible), combined systolic and diastolic heart failure, hypertension, hyperlipidemia, orthostatic hypotension. He wasadmittedto the hospital in [place] with syncope and ruled in for anon-STEMIin 10/2017. His LVF was worse on Echo with an EF of 20-25%. Cardiac catheterization demonstrated high-grade in-stent restenosis in the vein graft to the RCA which was treated with balloon angioplasty.  His heart failure therapy has been limited by syncope related to orthostatic hypotension.    Thoracic impedance during follow-up in the ICM clinic was abnormal suggesting fluid accumulation.  His furosemide was adjusted to 80 mg twice daily x2 days, then 80 mg daily.  His device also demonstrated persistent atrial fibrillation.  He was seen by Dr. [name] 02/27/2018 and placed on anticoagulation with Apixaban.  It is felt that this is likely causing worsening heart failure.  Plan is to proceed with cardioversion after 4 weeks of uninterrupted anticoagulation.     [name] returns for follow up.  He is here with his wife.  He denies chest pain, syncope, paroxysmal nocturnal dyspnea, orthopnea.  He notes worsening lower extremity swelling.  His weight is up 5 lbs at  home.  He notes chronic dyspnea on exertion without change.  He gets short of breath with minimal activity but denies shortness of breath at rest.    Prior CV studies:   The following studies were reviewed today:  Venous Duplex 01/16/18 Final Interpretation: Right: No evidence of deep vein thrombosis in the lower extremity. No indirect evidence of obstruction proximal to the inguinal ligament. No cystic structure found in the popliteal fossa. Fluid noted in the medial part of the knee. Left: No evidence of common femoral vein obstruction.  Cardiac catheterization 11/12/2017([facility], [place], [place]) LVEDP 16 EF 50% LM occluded RCA occluded SVG-RCA patent with mid stent with subtotal ISR SVG-OM patent stent LIMA-LAD patent PCI: POBA to the SVG-RCA  Echo 11/13/2017([facility], [place], [place]) EF 20-25, diffuse HK, mild MR, normally functioning aortic valve prosthesis, mild to moderate TR  Cardiac Catheterization12/20/18 LM ostial 100 RCA proximal 100-CTO SVG-RPDA ostial 90, distal 90 SVG-OM1 stent patent LIMA-LAD patent EF 40-45 Mean PCWP 20; LVEDP 22 PCI: 4 x 12 mm Resolute Onyx DES to the ostial/proximal SVG-RPDA  PCI: 3 x 22 mm Resolute Onyx DES to the distal SVG-RPDA Recommend: Continue clopidogrel without interruption, add aspirin 81 mg daily, diurese overnight with a single dose of IV Lasix and then continue oral furosemide. If no complications arise should be okay for discharge tomorrow.  Carotid US 05/14/17 L CEA patent; RICA 40-59  Echo 05/09/17 Inferior akinesis, mild LVH, EF 40, status post TAVR with no perivalvular regurgitation, MAC, mild MR, moderate LAE,  mild RAE, PASP 36  Echo 06/22/16 Mild LVH, EF 40-45, diffuse HK, grade 2 diastolic dysfunction, status post TAVR with no significant stenosis or perivalvular regurgitation, MAC, trivial MR, mild LAE, mild RAE, PASP 39  Past Medical History:  Diagnosis Date  .  Arthritis   . Benign prostatic hypertrophy   . CAD (coronary artery disease)    DES, SVG to circumflex marginal, October, 2010 ( SVG to PDA and PLA patent , LIMA to LAD patent, EF 60%, mild inferior hypokinesis 12/18 PCI/DES to SVG-PDA x2, EF 40% // NSTEMI 3/19 [facility]) >> S-OM and L-LAD ok; S-RCA stent sub-total ISR >> PCI:  POBA  . Chronic systolic CHF (congestive heart failure) ([facility])    Echo 9/18: Inferior akinesis, mild LVH, EF 40, status post TAVR with no perivalvular regurgitation, MAC, mild MR, moderate LAE, mild RAE, PASP 36 // Echo 3/19: EF 20-25, mild MR, AVR ok, mild to mod TR // Echo 6/19: Mild LVH, EF 30-35, diffuse HK, inferolateral and inferior AK, status post TAVR, no stenosis, no AI (mean gradient 5), MAC, mild MR, severe LAE, normal RVSF, moderate TR, PASP 50   . Complete heart block ([place])    a. s/p MDT CRTP 04/2016 Dr [name]  . GERD (gastroesophageal reflux disease)   . History of kidney stones 1970   /notes 01/03/2011  . HTN (hypertension)   . Hyperlipidemia   . Hypothyroidism   . Neuromuscular disorder ([place])   . Peripheral neuropathy   . Persistent atrial fibrillation ([place])   . Psoriasis    severe; with total skin exfoliation in the past resolved  . S/P TAVR (transcatheter aortic valve replacement)    severe AS >> s/p TAVR 9/17 // Limited Echo 10/17: EF 40-45%, lat and inf-lat HK, TAVR ok, MAC, mild BAE, PASP 31 mmHg, no effusion  . Ventricular tachycardia, non-sustained (HCC)    Surgical Hx: The patient  has a past surgical history that includes decompression of left median nerve; Colonoscopy (11/23/2011); Tonsillectomy; Appendectomy; Total knee arthroplasty (04/16/2012); Cataract extraction, bilateral; Carpal tunnel release (Bilateral, 2009-2011); left heart catheterization with coronary angiogram (N/A, 12/27/2012); TEE without cardioversion (N/A, 04/21/2016); Cardiac catheterization (N/A, 04/27/2016); Transcatheter aortic valve replacement, transfemoral (N/A, 05/16/2016); TEE  without cardioversion (N/A, 05/16/2016); Cardiac catheterization (N/A, 11/17/2015); Cardiac catheterization (N/A, 11/17/2015); Cardiac catheterization (N/A, 04/19/2016); Coronary angioplasty with stent; Coronary angioplasty with stent (08/09/2017); Coronary angioplasty with stent (11/17/2015); Lumbar laminectomy (Bilateral, 09/2004); Carotid endarterectomy (Left, 02/2008); Synovial cyst excision (09/2004); Back surgery; Joint replacement; Lumbar disc surgery; Anterior cervical decomp/discectomy fusion; Kidney stone surgery; Coronary artery bypass graft (1995); RIGHT/LEFT HEART CATH AND CORONARY/GRAFT ANGIOGRAPHY (N/A, 08/09/2017); and CORONARY STENT INTERVENTION (N/A, 08/09/2017).   Current Medications: Current Meds  Medication Sig  . allopurinol (ZYLOPRIM) 300 MG tablet Take 300 mg by mouth daily.  Marland Kitchen amoxicillin (AMOXIL) 500 MG tablet Take 500 mg by mouth as needed (Prior to dental appt.).  Marland Kitchen apixaban (ELIQUIS) 5 MG TABS tablet Take 1 tablet (5 mg total) by mouth 2 (two) times daily.  Marland Kitchen atorvastatin (LIPITOR) 20 MG tablet Take 20 mg by mouth every evening.   Mariane Baumgarten Sodium (COLACE PO) Take 2 tablets by mouth daily as needed (constipation).   . fluticasone (FLONASE) 50 MCG/ACT nasal spray Place 1 spray into both nostrils 2 (two) times daily.  . furosemide (LASIX) 40 MG tablet Take 2 tablets (80 mg total) by mouth daily.  Marland Kitchen latanoprost (XALATAN) 0.005 % ophthalmic solution Place 1 drop into both eyes at bedtime.  Marland Kitchen levothyroxine (SYNTHROID,  LEVOTHROID) 112 MCG tablet Take 112 mcg by mouth daily before breakfast.  . Magnesium 250 MG TABS Take 1 tablet by mouth 2 (two) times daily.  . Methylcellulose, Laxative, (CITRUCEL PO) Take by mouth as directed. Take 1 tablespoon at night  . metoprolol tartrate (LOPRESSOR) 25 MG tablet Take 0.5 tablets (12.5 mg total) by mouth every evening.  . multivitamin (THERAGRAN) per tablet Take 1 tablet by mouth at bedtime.   . nitroGLYCERIN (NITROSTAT) 0.4 MG SL tablet  Place 0.4 mg under the tongue every 5 (five) minutes x 3 doses as needed. For chest pain  . potassium chloride SA (KLOR-CON M20) 20 MEQ tablet Take 1 tablet (20 mEq total) by mouth 2 (two) times daily.  Marland Kitchen pyridOXINE (VITAMIN B-6) 100 MG tablet Take 100 mg by mouth daily.   . silver sulfADIAZINE (SILVADENE) 1 % cream Apply 1 application topically 2 (two) times daily.  . Tamsulosin HCl (FLOMAX) 0.4 MG CAPS Take 0.4 mg by mouth daily.      Allergies:   Other and Propylene glycol   Social History   Tobacco Use  . Smoking status: Never Smoker  . Smokeless tobacco: Never Used  Substance Use Topics  . Alcohol use: Yes    Alcohol/week: 0.6 oz    Types: 1 Cans of beer per week  . Drug use: No     Family Hx: The patient's family history includes Heart attack in his mother; Heart attack (age of onset: 67) in his son; Heart disease in his father and mother; Hyperlipidemia in his father; Hypertension in his father. There is no history of Colon cancer.  ROS:   Please see the history of present illness.    Review of Systems  Cardiovascular: Positive for dyspnea on exertion and leg swelling.  Respiratory: Positive for snoring and wheezing.   Hematologic/Lymphatic: Bruises/bleeds easily.  Neurological: Positive for loss of balance.   All other systems reviewed and are negative.   EKGs/Labs/Other Test Reviewed:    EKG:  EKG is not ordered today.   Recent Labs: 08/07/2017: NT-Pro BNP 2,488 03/07/2018: ALT 16; BUN 28; Creatinine, Ser 1.38; Hemoglobin 10.8; Platelets 138; Potassium 4.5; Sodium 141   Recent Lipid Panel Lab Results  Component Value Date/Time   CHOL 143 08/06/2006 08:14 AM   TRIG 141 08/06/2006 08:14 AM   HDL 29.7 (L) 08/06/2006 08:14 AM   CHOLHDL 4.8 CALC 08/06/2006 08:14 AM   LDLCALC 85 08/06/2006 08:14 AM    Physical Exam:    VS:  BP 138/78   Pulse 85   Ht _0  (1.727 m)   Wt 193 lb (87.5 kg)   SpO2 94%   BMI 29.35 kg/m     Wt Readings from Last 3 Encounters:   03/11/18 193 lb (87.5 kg)  02/27/18 167 lb (75.8 kg)  02/04/18 188 lb (85.3 kg)     Physical Exam  Constitutional: He is oriented to person, place, and time. He appears well-developed and well-nourished. No distress.  HENT:  Head: Normocephalic and atraumatic.  Eyes: No scleral icterus.  Neck: No JVD present.  Cardiovascular: Normal rate. An irregularly irregular rhythm present.  Murmur heard.  Holosystolic murmur is present with a grade of 2/6 at the lower left sternal border. Pulmonary/Chest: Effort normal. He has no wheezes. He has no rales.  Abdominal: Soft. He exhibits no distension.  Musculoskeletal: He exhibits edema (tight, 2+ brawny bilat LE edema).  Neurological: He is alert and oriented to person, place, and time.  Skin: Skin  is warm and dry.  Psychiatric: He has a normal mood and affect.    ASSESSMENT & PLAN:    Acute on chronic combined systolic and diastolic congestive heart failure (HCC)  EF 20-25.  He is NYHA 3.  He has significant lower extremity swelling.  His weights in our office are not reliable.  However, his weight at home is up about 5 pounds.  Dr. [name] mentioned that his atrial fibrillation may be making control of his heart failure more problematic.  I considered whether or not to change his furosemide to torsemide.  However, he does note that increased doses of furosemide are beneficial for him.  -Increase furosemide to 80 mg twice daily x3 days  -After 3 days, change Furosemide to 80 mg in the morning followed by 40 mg in the afternoon  -Increase potassium to 40 mEq in the morning and 20 mEq in the afternoon x3 days, then resume 20 mEq twice daily  -BMET 1 week  -Follow-up 2 weeks  Coronary artery disease involving coronary bypass graft of native heart without angina pectoris Prior history of CABG and subsequent PCI of the SVG-OM1, drug-eluting stent x2 to the SVG-RCA in December 2018 and balloon angioplasty of the SVG-RCA in March 2019.  He is no  longer on aspirin and Plavix as he is now on Apixaban.  He denies chest discomfort.  Continue statin therapy.  Persistent atrial fibrillation ([place]) He was recently noted to be in atrial fibrillation.  Continue Apixaban for anticoagulation.  I will see him back in 2 weeks.  If he remains in atrial fibrillation, I can arrange cardioversion at that visit.  Severe Aortic Stenosis S/P TAVR (transcatheter aortic valve replacement) Stable bioprosthetic valve by echo in March 2019.  Continue SBE prophylaxis.  Biventricular cardiac pacemaker in situ Continue follow-up with EP as planned.  Anemia, unspecified type Continue follow-up with hematology.  Recent hemoglobin improved after iron infusions.   Dispo:  Return in about 2 weeks (around 03/25/2018) for Close Follow Up, w/ [name], PA-C.   Medication Adjustments/Labs and Tests Ordered: Current medicines are reviewed at length with the patient today.  Concerns regarding medicines are outlined above.  Tests Ordered: Orders Placed This Encounter  Procedures  . Basic Metabolic Panel (BMET)   Medication Changes: No orders of the defined types were placed in this encounter.   Signed, [name], PA-C  03/11/2018 1:16 PM    [facility] [address], [place], [place]  [address] Phone: [phone]; Fax: [phone]

## 2018-03-12 ENCOUNTER — Telehealth: Payer: Self-pay

## 2018-03-12 NOTE — Telephone Encounter (Signed)
Remote ICM transmission received.  Attempted call to patient and left detailed message, per DPR, regarding transmission and next ICM scheduled for 03/15/2018 to recheck fluid levels.  Advised to return call for any fluid symptoms or questions.

## 2018-03-12 NOTE — Progress Notes (Addendum)
EPIC Encounter for ICM Monitoring  Patient Name: [name] is a 84 y.o. male Date: 03/12/2018 Primary Care Physican: [name], MD Primary Care Physican: [name], MD Primary Cardiologist:Cooper/Weaver, PA Electrophysiologist:Allred Dry Weight: Previous weight 193lbs   Clinical Status (08-Mar-2018 to 08-Mar-2018) AT/AF 1 episode  Time in AT/AF (100.0%) Observations (2) (08-Mar-2018 to 08-Mar-2018)  AT/AF >= 6 hr for 1 days.  V. Pacing less than 90%.       Attempted call to patient and unable to reach.  Left detailed message, per DPR, regarding transmission.  Transmission reviewed.   Patient had office visit with Dr [name] regarding Afib on 02/27/2018.   Thoracic impedance beginning to drop below baseline suggesting fluid accumulation.        Prescribed dosage: Furosemide 40 mg Take 1.5 tablets (60 mg total) by mouth daily.   Recommendations:  Left voice mail with ICM number and encouraged to call back if fluid symptoms, that he reported to [name], [place] on 7/22, get worse.    Follow-up plan: ICM clinic phone appointment on 03/14/2018 to recheck fluid levels.  Office appointment scheduled 03/26/2018 with [name], PA and Pacer check.    Copy of ICM check sent to Dr. [name].   3 month ICM trend: 03/08/2018    1 Year ICM trend:       Rosalene Billings, RN 03/12/2018 8:34 AM

## 2018-03-14 ENCOUNTER — Ambulatory Visit (INDEPENDENT_AMBULATORY_CARE_PROVIDER_SITE_OTHER): Payer: Medicare HMO

## 2018-03-14 ENCOUNTER — Inpatient Hospital Stay (HOSPITAL_COMMUNITY): Payer: Medicare HMO

## 2018-03-14 ENCOUNTER — Encounter (HOSPITAL_COMMUNITY): Payer: Self-pay

## 2018-03-14 VITALS — BP 120/54 | HR 77 | Temp 98.6°F | Resp 16

## 2018-03-14 DIAGNOSIS — Z950 Presence of cardiac pacemaker: Secondary | ICD-10-CM

## 2018-03-14 DIAGNOSIS — D649 Anemia, unspecified: Secondary | ICD-10-CM | POA: Diagnosis not present

## 2018-03-14 DIAGNOSIS — E039 Hypothyroidism, unspecified: Secondary | ICD-10-CM | POA: Diagnosis not present

## 2018-03-14 DIAGNOSIS — M109 Gout, unspecified: Secondary | ICD-10-CM | POA: Diagnosis not present

## 2018-03-14 DIAGNOSIS — I5042 Chronic combined systolic (congestive) and diastolic (congestive) heart failure: Secondary | ICD-10-CM

## 2018-03-14 DIAGNOSIS — I10 Essential (primary) hypertension: Secondary | ICD-10-CM | POA: Diagnosis not present

## 2018-03-14 DIAGNOSIS — I509 Heart failure, unspecified: Secondary | ICD-10-CM | POA: Diagnosis not present

## 2018-03-14 LAB — CUP PACEART REMOTE DEVICE CHECK
Battery Remaining Longevity: 78 mo
Battery Voltage: 2.98 V
Brady Statistic AP VP Percent: 43.7 %
Implantable Lead Implant Date: 20170907
Implantable Lead Implant Date: 20170907
Implantable Lead Location: 753858
Implantable Lead Location: 753859
Implantable Lead Model: 4598
Implantable Pulse Generator Implant Date: 20170907
Lead Channel Impedance Value: 285 Ohm
Lead Channel Impedance Value: 304 Ohm
Lead Channel Impedance Value: 323 Ohm
Lead Channel Impedance Value: 361 Ohm
Lead Channel Impedance Value: 399 Ohm
Lead Channel Impedance Value: 494 Ohm
Lead Channel Pacing Threshold Amplitude: 0.875 V
Lead Channel Pacing Threshold Amplitude: 1.125 V
Lead Channel Pacing Threshold Pulse Width: 0.4 ms
Lead Channel Pacing Threshold Pulse Width: 0.4 ms
Lead Channel Pacing Threshold Pulse Width: 0.8 ms
Lead Channel Sensing Intrinsic Amplitude: 0.75 mV
Lead Channel Sensing Intrinsic Amplitude: 9.25 mV
Lead Channel Setting Pacing Amplitude: 2 V
Lead Channel Setting Pacing Amplitude: 2.5 V
Lead Channel Setting Pacing Pulse Width: 0.4 ms
Lead Channel Setting Pacing Pulse Width: 0.8 ms
Lead Channel Setting Sensing Sensitivity: 0.9 mV
MDC IDC LEAD IMPLANT DT: 20170907
MDC IDC LEAD LOCATION: 753860
MDC IDC MSMT LEADCHNL LV IMPEDANCE VALUE: 266 Ohm
MDC IDC MSMT LEADCHNL LV IMPEDANCE VALUE: 323 Ohm
MDC IDC MSMT LEADCHNL LV IMPEDANCE VALUE: 380 Ohm
MDC IDC MSMT LEADCHNL LV IMPEDANCE VALUE: 399 Ohm
MDC IDC MSMT LEADCHNL LV IMPEDANCE VALUE: 513 Ohm
MDC IDC MSMT LEADCHNL LV IMPEDANCE VALUE: 570 Ohm
MDC IDC MSMT LEADCHNL LV IMPEDANCE VALUE: 608 Ohm
MDC IDC MSMT LEADCHNL LV IMPEDANCE VALUE: 646 Ohm
MDC IDC MSMT LEADCHNL LV PACING THRESHOLD AMPLITUDE: 1.375 V
MDC IDC MSMT LEADCHNL RA SENSING INTR AMPL: 0.75 mV
MDC IDC MSMT LEADCHNL RV SENSING INTR AMPL: 9.25 mV
MDC IDC SESS DTM: 20190720033254
MDC IDC SET LEADCHNL LV PACING AMPLITUDE: 2.5 V
MDC IDC STAT BRADY AP VS PERCENT: 5.56 %
MDC IDC STAT BRADY AS VP PERCENT: 24.07 %
MDC IDC STAT BRADY AS VS PERCENT: 27.24 %
MDC IDC STAT BRADY RA PERCENT PACED: 3.9 %
MDC IDC STAT BRADY RV PERCENT PACED: 81.97 %

## 2018-03-14 MED ORDER — FERUMOXYTOL INJECTION 510 MG/17 ML
510.0000 mg | Freq: Once | INTRAVENOUS | Status: AC
  Administered 2018-03-14: 510 mg via INTRAVENOUS
  Filled 2018-03-14: qty 17

## 2018-03-14 MED ORDER — SODIUM CHLORIDE 0.9 % IV SOLN
INTRAVENOUS | Status: DC
  Administered 2018-03-14: 14:00:00 via INTRAVENOUS

## 2018-03-14 NOTE — Progress Notes (Signed)
Great! Thanks! [name], PA-C    03/14/2018 5:11 PM

## 2018-03-14 NOTE — Patient Instructions (Signed)
[place] at [facility] Discharge Instructions  Feraheme today .  Follow up as scheduled.   Thank you for choosing [place] at [facility] to provide your oncology and hematology care.  To afford each patient quality time with our provider, please arrive at least 15 minutes before your scheduled appointment time.   If you have a lab appointment with the [facility] please come in thru the  Main Entrance and check in at the main information desk  You need to re-schedule your appointment should you arrive 10 or more minutes late.  We strive to give you quality time with our providers, and arriving late affects you and other patients whose appointments are after yours.  Also, if you no show three or more times for appointments you may be dismissed from the clinic at the providers discretion.     Again, thank you for choosing [facility].  Our hope is that these requests will decrease the amount of time that you wait before being seen by our physicians.       _____________________________________________________________  Should you have questions after your visit to [facility], please contact our office at [phone] between the hours of 8:00 a.m. and 4:30 p.m.  Voicemails left after 4:00 p.m. will not be returned until the following business day.  For prescription refill requests, have your pharmacy contact our office and allow 72 hours.    Cancer Center Support Programs:   > Cancer Support Group  2nd Tuesday of the month 1pm-2pm, Journey Room

## 2018-03-14 NOTE — Progress Notes (Signed)
EPIC Encounter for ICM Monitoring  Patient Name: [name] is a 84 y.o. male Date: 03/14/2018 Primary Care Physican: Fagan, Roy, MD Primary Cardiologist:Cooper/Weaver, PA Electrophysiologist:Allred Dry Weight:  191lbs  Bi-V Pacing:   82.4%   Clinical Status (08-Mar-2018 to 14-Mar-2018)  AT/AF 164 episodes   Time in AT/AF 23.5 hr/day (98.1%)  Observations (3) (08-Mar-2018 to 14-Mar-2018)  AT/AF >= 6 hr for 6 days.  V. Pacing less than 90%.  CRT Pacing is effective less than 90% of the time.      Heart Failure questions reviewed, pt asymptomatic.  He is receiving iron infusion today.   Thoracic impedance normal in response to increase in Furosemide dosage on 03/11/18.        Prescribed dosage: Furosemide 40 mgTake 2 tablets (80 mg total) every morning and 1 tablet (40 mg total) every evening.  Potassium 20 mEq take 1 tablet twice a day.  Labs: 03/07/2018 Creatinine 1.38, BUN 28, Potassium 4.5, Sodium 141, EGFR 47-54 02/27/2018 Creatinine 1.29, BUN 28, Potassium 4.1, Sodium 140, EGFR 49-57  02/12/2018 Creatinine 1.37, BUN 30, Potassium 4.2, Sodium 141, EGFR 47-54  02/04/2018 Creatinine 1.38, BUN 28, Potassium 4.3, Sodium 143, EGFR 47-54  02/01/2018 Creatinine 1.34, BUN 21, Potassium 4.3, Sodium 142, EGFR 48-56  12/28/2017 Creatinine 1.24, BUN 30, Potassium 4.5, Sodium 138, EGFR 52-60 A complete set of results can be found in results review.   Recommendations: No changes.    Encouraged to call for fluid symptoms.  Follow-up plan: ICM clinic phone appointment on 04/04/2018 to recheck fluid levels.   Office appointment scheduled 03/26/2018 with Scott Weaver, PA and office defib check same day.  Office appt scheduled 04/18/2018 with Amber Seiler, NP.  Copy of ICM check sent to Dr. Allred and Scott Weaver, PA.   3 month ICM trend: 03/14/2018    AT/AF   1 Year ICM trend:       Laurie S Short, RN 03/14/2018 9:24 AM   

## 2018-03-14 NOTE — Progress Notes (Signed)
Feraheme given per orders. Patient tolerated it well without problems. Vitals stable and discharged home from clinic ambulatory. Follow up as scheduled.  

## 2018-03-18 ENCOUNTER — Other Ambulatory Visit: Payer: Medicare HMO | Admitting: *Deleted

## 2018-03-18 DIAGNOSIS — I5043 Acute on chronic combined systolic (congestive) and diastolic (congestive) heart failure: Secondary | ICD-10-CM

## 2018-03-18 LAB — BASIC METABOLIC PANEL
BUN / CREAT RATIO: 21 (ref 10–24)
BUN: 28 mg/dL — ABNORMAL HIGH (ref 8–27)
CHLORIDE: 98 mmol/L (ref 96–106)
CO2: 28 mmol/L (ref 20–29)
CREATININE: 1.34 mg/dL — AB (ref 0.76–1.27)
Calcium: 9.8 mg/dL (ref 8.6–10.2)
GFR calc Af Amer: 56 mL/min/{1.73_m2} — ABNORMAL LOW (ref >59)
GFR calc non Af Amer: 48 mL/min/{1.73_m2} — ABNORMAL LOW (ref >59)
GLUCOSE: 104 mg/dL — AB (ref 65–99)
POTASSIUM: 4 mmol/L (ref 3.5–5.2)
Sodium: 141 mmol/L (ref 134–144)

## 2018-03-20 ENCOUNTER — Telehealth: Payer: Self-pay | Admitting: *Deleted

## 2018-03-20 NOTE — Telephone Encounter (Signed)
Left message to go over lab results.  

## 2018-03-20 NOTE — Telephone Encounter (Signed)
-----   Message from Liliane Shi, [place] sent at 03/19/2018  5:19 PM EDT ----- The following labs are stable without significant clinical change: Renal function. All other results are normal or within acceptable limits. Medication changes / Follow up labs / Other changes or recommendations:   Continue current medications and follow up as planned.  [name], PA-C 03/19/2018 5:18 PM

## 2018-03-20 NOTE — Telephone Encounter (Signed)
Follow Up:   Returning your call from this morning, concerning his results.

## 2018-03-20 NOTE — Telephone Encounter (Signed)
Pt and his wife have been advised of pt's lab results by phone with verbal understanding. Pt and his wife thanked me for the call. Pt confirmed his appt 03/26/18 with [facility] [place] . I will forward a copy of results to PCP Dr. [name].

## 2018-03-26 ENCOUNTER — Ambulatory Visit: Payer: Medicare HMO | Admitting: Physician Assistant

## 2018-03-26 ENCOUNTER — Ambulatory Visit (INDEPENDENT_AMBULATORY_CARE_PROVIDER_SITE_OTHER): Payer: Medicare HMO | Admitting: *Deleted

## 2018-03-26 ENCOUNTER — Encounter: Payer: Self-pay | Admitting: *Deleted

## 2018-03-26 ENCOUNTER — Encounter: Payer: Self-pay | Admitting: Physician Assistant

## 2018-03-26 VITALS — BP 140/80 | HR 98 | Ht 68.0 in | Wt 194.6 lb

## 2018-03-26 DIAGNOSIS — I5043 Acute on chronic combined systolic (congestive) and diastolic (congestive) heart failure: Secondary | ICD-10-CM

## 2018-03-26 DIAGNOSIS — Z952 Presence of prosthetic heart valve: Secondary | ICD-10-CM | POA: Diagnosis not present

## 2018-03-26 DIAGNOSIS — I481 Persistent atrial fibrillation: Secondary | ICD-10-CM

## 2018-03-26 DIAGNOSIS — I25810 Atherosclerosis of coronary artery bypass graft(s) without angina pectoris: Secondary | ICD-10-CM

## 2018-03-26 DIAGNOSIS — I4819 Other persistent atrial fibrillation: Secondary | ICD-10-CM

## 2018-03-26 DIAGNOSIS — Z950 Presence of cardiac pacemaker: Secondary | ICD-10-CM | POA: Diagnosis not present

## 2018-03-26 DIAGNOSIS — I5042 Chronic combined systolic (congestive) and diastolic (congestive) heart failure: Secondary | ICD-10-CM | POA: Diagnosis not present

## 2018-03-26 LAB — CUP PACEART INCLINIC DEVICE CHECK
Battery Voltage: 2.97 V
Brady Statistic AP VS Percent: 5.48 %
Brady Statistic AS VP Percent: 27.1 %
Brady Statistic AS VS Percent: 28.31 %
Brady Statistic RV Percent Paced: 81.76 %
Implantable Lead Implant Date: 20170907
Implantable Lead Location: 753860
Implantable Lead Model: 4598
Implantable Lead Model: 5076
Implantable Lead Model: 5076
Lead Channel Impedance Value: 285 Ohm
Lead Channel Impedance Value: 304 Ohm
Lead Channel Impedance Value: 418 Ohm
Lead Channel Impedance Value: 494 Ohm
Lead Channel Impedance Value: 513 Ohm
Lead Channel Impedance Value: 627 Ohm
Lead Channel Pacing Threshold Amplitude: 1.25 V
Lead Channel Pacing Threshold Pulse Width: 0.4 ms
Lead Channel Pacing Threshold Pulse Width: 0.8 ms
Lead Channel Sensing Intrinsic Amplitude: 0.375 mV
Lead Channel Sensing Intrinsic Amplitude: 10.25 mV
Lead Channel Sensing Intrinsic Amplitude: 10.25 mV
Lead Channel Setting Pacing Amplitude: 2 V
Lead Channel Setting Pacing Amplitude: 2.25 V
Lead Channel Setting Pacing Pulse Width: 0.4 ms
MDC IDC LEAD IMPLANT DT: 20170907
MDC IDC LEAD IMPLANT DT: 20170907
MDC IDC LEAD LOCATION: 753858
MDC IDC LEAD LOCATION: 753859
MDC IDC MSMT BATTERY REMAINING LONGEVITY: 81 mo
MDC IDC MSMT LEADCHNL LV IMPEDANCE VALUE: 399 Ohm
MDC IDC MSMT LEADCHNL LV IMPEDANCE VALUE: 570 Ohm
MDC IDC MSMT LEADCHNL LV IMPEDANCE VALUE: 646 Ohm
MDC IDC MSMT LEADCHNL RA IMPEDANCE VALUE: 361 Ohm
MDC IDC MSMT LEADCHNL RA PACING THRESHOLD AMPLITUDE: 0.875 V
MDC IDC MSMT LEADCHNL RA PACING THRESHOLD PULSEWIDTH: 0.4 ms
MDC IDC MSMT LEADCHNL RA SENSING INTR AMPL: 0.5 mV
MDC IDC MSMT LEADCHNL RV PACING THRESHOLD AMPLITUDE: 1.125 V
MDC IDC PG IMPLANT DT: 20170907
MDC IDC SESS DTM: 20190806152925
MDC IDC SET LEADCHNL LV PACING PULSEWIDTH: 0.8 ms
MDC IDC SET LEADCHNL RV PACING AMPLITUDE: 2.5 V
MDC IDC SET LEADCHNL RV SENSING SENSITIVITY: 0.9 mV
MDC IDC STAT BRADY AP VP PERCENT: 39.66 %
MDC IDC STAT BRADY RA PERCENT PACED: 3.73 %

## 2018-03-26 MED ORDER — FUROSEMIDE 80 MG PO TABS: 80.0000 mg | ORAL_TABLET | Freq: Two times a day (BID) | ORAL | 3 refills | Status: DC

## 2018-03-26 NOTE — Progress Notes (Signed)
  CRT-P device check in clinic. Normal device function. Thresholds, sensing, impedance consistent with previous measurements. Histograms appropriate for patient and level of activity. 98.8% AF burden+ Eliquis. No ventricular high rate episodes. Patient effective bi-ventricularly pacing 81.8% of the time. Device programmed with appropriate safety margins. Optivol above baseline, but below threshold. Estimated longevity 6.7 years. Plan for cardioversion 03/27/18. ROV with AS 04/18/18, ROV with JA 05/13/18.

## 2018-03-26 NOTE — Patient Instructions (Addendum)
Medication Instructions:  1. INCREASE LASIX TO 80 MG TWICE DAILY; NEW RX HAS BEEN SENT IN   Labwork: TODAY BMET   Testing/Procedures: Your physician has recommended that you have a Cardioversion (DCCV). Electrical Cardioversion uses a jolt of electricity to your heart either through paddles or wired patches attached to your chest. This is a controlled, usually prescheduled, procedure. Defibrillation is done under light anesthesia in the hospital, and you usually go home the day of the procedure. This is done to get your heart back into a normal rhythm. You are not awake for the procedure. Please see the instruction sheet given to you today.    Follow-Up: [name], [facility] ON 04/02/18 @ 8:30 AM FOR FOLLOW UP AFTER CARDIOVERSION   Any Other Special Instructions Will Be Listed Below (If Applicable).  If you need a refill on your cardiac medications before your next appointment, please call your pharmacy.    Dear MR. Maffia You are scheduled for a Cardioversion on 03/27/18 with Dr. Stanford Breed @ 1:45 PM.  Please arrive at the [facility] (Main Entrance A) at [facility]: [place], [place] [address] at 12:30 PM  (1 hour prior to procedure unless lab work is needed; if lab work is needed arrive 1.5 hours ahead)  DIET: Nothing to eat or drink after midnight except a sip of water with medications (see medication instructions below)  Medication Instructions: [place] 03/27/18  Continue your anticoagulant: ELIQUIS  You will need to continue your anticoagulant after your procedure until you  are told by your  Provider that it is safe to stop   Labs: If patient is on Coumadin, patient needs pt/INR, CBC, BMET within 3 days (No pt/INR needed for patients taking Xarelto, Eliquis, Pradaxa) For patients receiving anesthesia for TEE and all Cardioversion patients: BMET, CBC within 1 week  Come to: TODAY 03/26/18 (Lab option #1) Come to the lab at [facility] between the hours of 8:00 am and 4:30 pm. You do not have to be fasting. (Lab option #2) Lab at an alternate location (Lab option #3) your lab work will be done at the hospital prior to your procedure - you will need to arrive 1  hours ahead of your procedure  You must have a responsible person to drive you home and stay in the waiting area during your procedure. Failure to do so could result in cancellation.  Bring your insurance cards.  *Special Note: Every effort is made to have your procedure done on time. Occasionally there are emergencies that occur at the hospital that may cause delays. Please be patient if a delay does occur.

## 2018-03-26 NOTE — Progress Notes (Signed)
Cardiology Office Note:    Date:  03/26/2018   ID:  MCDONALD REILING, DOB 1931-07-17, MRN [id number]  PCP:  [name], MD  Cardiologist:  [name], MD   Electrophysiologist:  [name], MD     Referring MD: [name], MD   Chief Complaint  Patient presents with  . Follow-up    CHF, AFib     History of Present Illness:    [name] is a 84 y.o. male with  coronary artery disease status post CABGin 2010. He has undergone multiple PCIprocedures since. He underwentstenting of the vein graft to the OM1in 2017, DES to ostial and distal portions of the S-R PDA in 07/2017. Other hx includes atrial fibrillation, aortic stenosis status post TAVR in September 2017, complete heart block status postCRT-P (Medtr[employer] MRI compatible), combined systolic and diastolic heart failure, hypertension, hyperlipidemia, orthostatic hypotension. He wasadmittedto the hospital in [place] and ruled in for[place]-STEMIin 10/2017. His LVF was worse on Echo with an EF of 20-25%. Cardiac catheterization demonstrated high-grade in-stent restenosis in the vein graft to the RCA which was treated with balloon angioplasty.  His heart failure therapy has been limited by syncope related to orthostatic hypotension.    He has recently been noted to be in atrial fibrillation and was placed on Apixaban.  He has had more issues with volume excess since being in atrial fibrillation.  Last seen 03/11/18.    [name] returns for follow up.  He is here with his wife.  He remains weak and short of breath with mild activities.  He denies chest pain, syncope, paroxysmal nocturnal dyspnea.  He continues to have significant leg swelling up to his thighs.  It may have been slightly better when he was on the Lasix 80 mg Twice daily.    Prior CV studies:   The following studies were reviewed today:  Cardiac catheterization 11/12/2017([facility], [place], [place]) LVEDP  16 EF 50% LM occluded RCA occluded SVG-RCA patent with mid stent with subtotal ISR SVG-OM patent stent LIMA-LAD patent PCI: POBA to the SVG-RCA  Echo 11/13/2017([facility], [place], [place]) EF 20-25, diffuse HK, mild MR, normally functioning aortic valve prosthesis, mild to moderate TR  Cardiac Catheterization12/20/18 LM ostial 100 RCA proximal 100-CTO SVG-RPDA ostial 90, distal 90 SVG-OM1 stent patent LIMA-LAD patent EF 40-45 Mean PCWP 20; LVEDP 22 PCI: 4 x 12 mm Resolute Onyx DES to the ostial/proximal SVG-RPDA  PCI: 3 x 22 mm Resolute Onyx DES to the distal SVG-RPDA Recommend: Continue clopidogrel without interruption, add aspirin 81 mg daily, diurese overnight with a single dose of IV Lasix and then continue oral furosemide. If no complications arise should be okay for discharge tomorrow.  Carotid US 05/14/17 L CEA patent; RICA 40-59  Echo 05/09/17 Inferior akinesis, mild LVH, EF 40, status post TAVR with no perivalvular regurgitation, MAC, mild MR, moderate LAE, mild RAE, PASP 36  Echo 06/22/16 Mild LVH, EF 40-45, diffuse HK, grade 2 diastolic dysfunction, status post TAVR with no significant stenosis or perivalvular regurgitation, MAC, trivial MR, mild LAE, mild RAE, PASP 39  Past Medical History:  Diagnosis Date  . Arthritis   . Benign prostatic hypertrophy   . CAD (coronary artery disease)    DES, SVG to circumflex marginal, October, 2010 ( SVG to PDA and PLA patent , LIMA to LAD patent, EF 60%, mild inferior hypokinesis 12/18 PCI/DES to SVG-PDA x2, EF 40% // NSTEMI 3/19 ([place]) >> S-OM and L-LAD  ok; S-RCA stent sub-total ISR >> PCI:  POBA  . Chronic systolic CHF (congestive heart failure) ([place])    Echo 9/18: Inferior akinesis, mild LVH, EF 40, status post TAVR with no perivalvular regurgitation, MAC, mild MR, moderate LAE, mild RAE, PASP 36 // Echo 3/19: EF 20-25, mild MR, AVR ok, mild to mod TR // Echo 6/19: Mild LVH, EF 30-35, diffuse HK,  inferolateral and inferior AK, status post TAVR, no stenosis, no AI (mean gradient 5), MAC, mild MR, severe LAE, normal RVSF, moderate TR, PASP 50   . Complete heart block ([place])    a. s/p MDT CRTP 04/2016 Dr [name]  . GERD (gastroesophageal reflux disease)   . History of kidney stones 1970   /notes 01/03/2011  . HTN (hypertension)   . Hyperlipidemia   . Hypothyroidism   . Neuromuscular disorder ([place])   . Peripheral neuropathy   . Persistent atrial fibrillation ([place])   . Psoriasis    severe; with total skin exfoliation in the past resolved  . S/P TAVR (transcatheter aortic valve replacement)    severe AS >> s/p TAVR 9/17 // Limited Echo 10/17: EF 40-45%, lat and inf-lat HK, TAVR ok, MAC, mild BAE, PASP 31 mmHg, no effusion  . Ventricular tachycardia, non-sustained (HCC)    Surgical Hx: The patient  has a past surgical history that includes decompression of left median nerve; Colonoscopy (11/23/2011); Tonsillectomy; Appendectomy; Total knee arthroplasty (04/16/2012); Cataract extraction, bilateral; Carpal tunnel release (Bilateral, 2009-2011); left heart catheterization with coronary angiogram (N/A, 12/27/2012); TEE without cardioversion (N/A, 04/21/2016); Cardiac catheterization (N/A, 04/27/2016); Transcatheter aortic valve replacement, transfemoral (N/A, 05/16/2016); TEE without cardioversion (N/A, 05/16/2016); Cardiac catheterization (N/A, 11/17/2015); Cardiac catheterization (N/A, 11/17/2015); Cardiac catheterization (N/A, 04/19/2016); Coronary angioplasty with stent; Coronary angioplasty with stent (08/09/2017); Coronary angioplasty with stent (11/17/2015); Lumbar laminectomy (Bilateral, 09/2004); Carotid endarterectomy (Left, 02/2008); Synovial cyst excision (09/2004); Back surgery; Joint replacement; Lumbar disc surgery; Anterior cervical decomp/discectomy fusion; Kidney stone surgery; Coronary artery bypass graft (1995); RIGHT/LEFT HEART CATH AND CORONARY/GRAFT ANGIOGRAPHY (N/A, 08/09/2017); and CORONARY  STENT INTERVENTION (N/A, 08/09/2017).   Current Medications: Current Meds  Medication Sig  . allopurinol (ZYLOPRIM) 300 MG tablet Take 300 mg by mouth daily.  Marland Kitchen apixaban (ELIQUIS) 5 MG TABS tablet Take 1 tablet (5 mg total) by mouth 2 (two) times daily.  Marland Kitchen atorvastatin (LIPITOR) 20 MG tablet Take 20 mg by mouth every evening.   . docusate sodium (COLACE) 100 MG capsule Take 100 mg by mouth daily as needed for moderate constipation.   . fluticasone (FLONASE) 50 MCG/ACT nasal spray Place 1 spray into both nostrils daily.   Marland Kitchen latanoprost (XALATAN) 0.005 % ophthalmic solution Place 1 drop into both eyes at bedtime.  Marland Kitchen levothyroxine (SYNTHROID, LEVOTHROID) 112 MCG tablet Take 112 mcg by mouth daily before breakfast.  . Magnesium 250 MG TABS Take 250 mg by mouth 2 (two) times daily.   . metoprolol tartrate (LOPRESSOR) 25 MG tablet Take 0.5 tablets (12.5 mg total) by mouth every evening.  . multivitamin (THERAGRAN) per tablet Take 1 tablet by mouth at bedtime.   . nitroGLYCERIN (NITROSTAT) 0.4 MG SL tablet Place 0.4 mg under the tongue every 5 (five) minutes x 3 doses as needed for chest pain.   . potassium chloride SA (KLOR-CON M20) 20 MEQ tablet Take 1 tablet (20 mEq total) by mouth 2 (two) times daily.  Marland Kitchen pyridOXINE (VITAMIN B-6) 100 MG tablet Take 100 mg by mouth daily.   . Tamsulosin HCl (FLOMAX) 0.4 MG CAPS Take  0.4 mg by mouth daily.   . [DISCONTINUED] amoxicillin (AMOXIL) 500 MG tablet Take 500 mg by mouth as needed (Prior to dental appt.).  . [DISCONTINUED] furosemide (LASIX) 40 MG tablet Take 2 tablets (80 mg total) by mouth as directed. 80 mg twice daily for 3 days; then change to 80 mg in the AM and 40 mg in the PM  . [DISCONTINUED] Methylcellulose, Laxative, (CITRUCEL PO) Take 1 each by mouth See admin instructions. Take 1 tablespoon at night   . [DISCONTINUED] silver sulfADIAZINE (SILVADENE) 1 % cream Apply 1 application topically 2 (two) times daily.     Allergies:   Other and  Propylene glycol   Social History   Tobacco Use  . Smoking status: Never Smoker  . Smokeless tobacco: Never Used  Substance Use Topics  . Alcohol use: Yes    Alcohol/week: 0.6 oz    Types: 1 Cans of beer per week  . Drug use: No     Family Hx: The patient's family history includes Heart attack in his mother; Heart attack (age of onset: 67) in his son; Heart disease in his father and mother; Hyperlipidemia in his father; Hypertension in his father. There is no history of Colon cancer.  ROS:   Please see the history of present illness.    Review of Systems  Cardiovascular: Positive for dyspnea on exertion and leg swelling.   All other systems reviewed and are negative.   EKGs/Labs/Other Test Reviewed:    EKG:  EKG is not ordered today.   Recent Labs: 08/07/2017: NT-Pro BNP 2,488 03/07/2018: ALT 16; Hemoglobin 10.8; Platelets 138 03/18/2018: BUN 28; Creatinine, Ser 1.34; Potassium 4.0; Sodium 141   Recent Lipid Panel Lab Results  Component Value Date/Time   CHOL 143 08/06/2006 08:14 AM   TRIG 141 08/06/2006 08:14 AM   HDL 29.7 (L) 08/06/2006 08:14 AM   CHOLHDL 4.8 CALC 08/06/2006 08:14 AM   LDLCALC 85 08/06/2006 08:14 AM    Physical Exam:    VS:  BP 140/80   Pulse 98   Ht _0  (1.727 m)   Wt 194 lb 9.6 oz (88.3 kg)   SpO2 94%   BMI 29.59 kg/m     Wt Readings from Last 3 Encounters:  03/26/18 194 lb 9.6 oz (88.3 kg)  03/26/18 194 lb 9.6 oz (88.3 kg)  03/11/18 193 lb (87.5 kg)     Physical Exam  Constitutional: He is oriented to person, place, and time. He appears well-developed and well-nourished. No distress.  HENT:  Head: Normocephalic and atraumatic.  Eyes: No scleral icterus.  Neck: Neck supple.  Cardiovascular: Normal rate. An irregularly irregular rhythm present.  Murmur heard.  Systolic murmur is present with a grade of 2/6 at the upper right sternal border and upper left sternal border. Pulmonary/Chest: Effort normal. He has no wheezes. He has no  rales.  Abdominal: He exhibits distension.  Musculoskeletal: He exhibits edema (2+ bilat LE edema up to the thighs).  Neurological: He is alert and oriented to person, place, and time.  Skin: Skin is warm and dry.  Psychiatric: He has a normal mood and affect.    ASSESSMENT & PLAN:    Acute on chronic combined systolic and diastolic congestive heart failure (HCC)  EF 20-25.  NYHA 3.  He remains significantly volume overloaded.  Device interrogation shows that his Optivol is increased again.  He is likely having more difficulty with volume excess due to atrial fibrillation (loss of atrial kick). Hopefully, his  volume management will improve with restoration of normal sinus rhythm.  I will arrange DCCV this week.  His CHF therapy has been limited by orthostatic hypotension.  His BP is improved.  Hopefully, we can intensify his CHF management in the near future.  For now, continue low dose beta-blocker.  I will adjust his diuresis further.  -Continue Metoprolol tartrate once daily  -Increase Lasix to 80 mg Twice daily  -BMET today  -Close follow up next week  -If volume does not improve with restoration of NSR, he may need to be admitted for IV diuresis.  Persistent atrial fibrillation [facility]) Device interrogation today demonstrates persistent AFib.  He has been adherent with Eliquis for 4 weeks.  Risks and benefits of proceeding with cardioversion were discussed with the patient. He agrees to proceed.   -Arrange cardioversion in the next 1-2 days  Coronary artery disease involving coronary bypass graft of native heart without angina pectoris Prior history of CABG and subsequent PCI of the SVG-OM1, drug-eluting stent x2 to the SVG-RCA in December 2018 and balloon angioplasty of the SVG-RCA in March 2019.  No angina.  Continue statin, beta-blocker.  Severe Aortic Stenosis S/P TAVR (transcatheter aortic valve replacement) Stable bioprosthetic aortic valve by echo 3/19.  Continue SBE  prophylaxis.  Biventricular cardiac pacemaker in situ Continue follow up with EP as planned.     Dispo:  Return in about 1 week (around 04/02/2018) for Post Procedure Follow Up, w/ Dr. Burt Knack, or [name], PA-C.   Medication Adjustments/Labs and Tests Ordered: Current medicines are reviewed at length with the patient today.  Concerns regarding medicines are outlined above.  Tests Ordered: Orders Placed This Encounter  Procedures  . Basic Metabolic Panel (BMET)   Medication Changes: Meds ordered this encounter  Medications  . furosemide (LASIX) 80 MG tablet    Sig: Take 1 tablet (80 mg total) by mouth 2 (two) times daily.    Dispense:  180 tablet    Refill:  3    DOSE INCREASE    Signed, [name], PA-C  03/26/2018 1:25 PM    [facility] [address], [place], [place]  [address] Phone: [phone]; Fax: [phone]

## 2018-03-27 ENCOUNTER — Encounter (HOSPITAL_COMMUNITY): Payer: Self-pay

## 2018-03-27 ENCOUNTER — Ambulatory Visit (HOSPITAL_COMMUNITY): Payer: Medicare HMO | Admitting: Anesthesiology

## 2018-03-27 ENCOUNTER — Other Ambulatory Visit: Payer: Self-pay

## 2018-03-27 ENCOUNTER — Encounter (HOSPITAL_COMMUNITY)
Admission: RE | Disposition: A | Discharge status: Home / Self Care | Payer: Self-pay | Source: Ambulatory Visit | Attending: Cardiology

## 2018-03-27 ENCOUNTER — Ambulatory Visit (HOSPITAL_COMMUNITY)
Admission: RE | Admit: 2018-03-27 | Discharge: 2018-03-27 | Disposition: A | Discharge status: Home / Self Care | Payer: Medicare HMO | Source: Ambulatory Visit | Attending: Cardiology | Admitting: Cardiology

## 2018-03-27 DIAGNOSIS — Z955 Presence of coronary angioplasty implant and graft: Secondary | ICD-10-CM | POA: Insufficient documentation

## 2018-03-27 DIAGNOSIS — E039 Hypothyroidism, unspecified: Secondary | ICD-10-CM | POA: Diagnosis not present

## 2018-03-27 DIAGNOSIS — Z9842 Cataract extraction status, left eye: Secondary | ICD-10-CM | POA: Insufficient documentation

## 2018-03-27 DIAGNOSIS — I5042 Chronic combined systolic (congestive) and diastolic (congestive) heart failure: Secondary | ICD-10-CM | POA: Insufficient documentation

## 2018-03-27 DIAGNOSIS — E1142 Type 2 diabetes mellitus with diabetic polyneuropathy: Secondary | ICD-10-CM | POA: Diagnosis not present

## 2018-03-27 DIAGNOSIS — Z7901 Long term (current) use of anticoagulants: Secondary | ICD-10-CM | POA: Insufficient documentation

## 2018-03-27 DIAGNOSIS — Z952 Presence of prosthetic heart valve: Secondary | ICD-10-CM | POA: Insufficient documentation

## 2018-03-27 DIAGNOSIS — I2510 Atherosclerotic heart disease of native coronary artery without angina pectoris: Secondary | ICD-10-CM | POA: Diagnosis not present

## 2018-03-27 DIAGNOSIS — K219 Gastro-esophageal reflux disease without esophagitis: Secondary | ICD-10-CM | POA: Diagnosis not present

## 2018-03-27 DIAGNOSIS — Z79899 Other long term (current) drug therapy: Secondary | ICD-10-CM | POA: Diagnosis not present

## 2018-03-27 DIAGNOSIS — I481 Persistent atrial fibrillation: Secondary | ICD-10-CM | POA: Diagnosis not present

## 2018-03-27 DIAGNOSIS — E785 Hyperlipidemia, unspecified: Secondary | ICD-10-CM | POA: Insufficient documentation

## 2018-03-27 DIAGNOSIS — M1990 Unspecified osteoarthritis, unspecified site: Secondary | ICD-10-CM | POA: Insufficient documentation

## 2018-03-27 DIAGNOSIS — Z79890 Hormone replacement therapy (postmenopausal): Secondary | ICD-10-CM | POA: Diagnosis not present

## 2018-03-27 DIAGNOSIS — N400 Enlarged prostate without lower urinary tract symptoms: Secondary | ICD-10-CM | POA: Insufficient documentation

## 2018-03-27 DIAGNOSIS — Z87442 Personal history of urinary calculi: Secondary | ICD-10-CM | POA: Diagnosis not present

## 2018-03-27 DIAGNOSIS — Z981 Arthrodesis status: Secondary | ICD-10-CM | POA: Diagnosis not present

## 2018-03-27 DIAGNOSIS — Z951 Presence of aortocoronary bypass graft: Secondary | ICD-10-CM | POA: Insufficient documentation

## 2018-03-27 DIAGNOSIS — Z7951 Long term (current) use of inhaled steroids: Secondary | ICD-10-CM | POA: Insufficient documentation

## 2018-03-27 DIAGNOSIS — Z8249 Family history of ischemic heart disease and other diseases of the circulatory system: Secondary | ICD-10-CM | POA: Insufficient documentation

## 2018-03-27 DIAGNOSIS — Z950 Presence of cardiac pacemaker: Secondary | ICD-10-CM | POA: Insufficient documentation

## 2018-03-27 DIAGNOSIS — I4819 Other persistent atrial fibrillation: Secondary | ICD-10-CM

## 2018-03-27 DIAGNOSIS — I110 Hypertensive heart disease with heart failure: Secondary | ICD-10-CM | POA: Insufficient documentation

## 2018-03-27 DIAGNOSIS — Z9841 Cataract extraction status, right eye: Secondary | ICD-10-CM | POA: Diagnosis not present

## 2018-03-27 DIAGNOSIS — Z9889 Other specified postprocedural states: Secondary | ICD-10-CM | POA: Diagnosis not present

## 2018-03-27 HISTORY — PX: CARDIOVERSION: SHX1299

## 2018-03-27 LAB — BASIC METABOLIC PANEL
BUN / CREAT RATIO: 19 (ref 10–24)
BUN: 26 mg/dL (ref 8–27)
CHLORIDE: 98 mmol/L (ref 96–106)
CO2: 30 mmol/L — ABNORMAL HIGH (ref 20–29)
Calcium: 9.5 mg/dL (ref 8.6–10.2)
Creatinine, Ser: 1.4 mg/dL — ABNORMAL HIGH (ref 0.76–1.27)
GFR, EST AFRICAN AMERICAN: 53 mL/min/{1.73_m2} — AB (ref >59)
GFR, EST NON AFRICAN AMERICAN: 46 mL/min/{1.73_m2} — AB (ref >59)
Glucose: 96 mg/dL (ref 65–99)
POTASSIUM: 3.8 mmol/L (ref 3.5–5.2)
Sodium: 142 mmol/L (ref 134–144)

## 2018-03-27 SURGERY — CARDIOVERSION
Anesthesia: General

## 2018-03-27 MED ORDER — SODIUM CHLORIDE 0.9% FLUSH: 3.0000 mL | INTRAVENOUS | Status: DC | PRN

## 2018-03-27 MED ORDER — SODIUM CHLORIDE 0.9% FLUSH: 3.0000 mL | Freq: Two times a day (BID) | INTRAVENOUS | Status: DC

## 2018-03-27 MED ORDER — SODIUM CHLORIDE 0.9 % IV SOLN
250.0000 mL | INTRAVENOUS | Status: DC
  Administered 2018-03-27: 13:00:00 via INTRAVENOUS

## 2018-03-27 MED ORDER — PROPOFOL 10 MG/ML IV BOLUS
INTRAVENOUS | Status: DC | PRN
  Administered 2018-03-27: 45 mg via INTRAVENOUS

## 2018-03-27 NOTE — Transfer of Care (Signed)
Immediate Anesthesia Transfer of Care Note  Patient: [name].  Procedure(s) Performed: CARDIOVERSION (N/A )  Patient Location: Endoscopy Unit  Anesthesia Type:General  Level of Consciousness: awake, alert  and oriented  Airway & Oxygen Therapy: Patient Spontanous Breathing and Patient connected to nasal cannula oxygen  Post-op Assessment: Report given to RN, Post -op Vital signs reviewed and stable and Patient moving all extremities X 4  Post vital signs: Reviewed and stable  Last Vitals:  Vitals Value Taken Time  BP 133/58 03/27/2018  1:38 PM  Temp 36.5 C 03/27/2018  1:32 PM  Pulse 78 03/27/2018  1:39 PM  Resp 20 03/27/2018  1:39 PM  SpO2 95 % 03/27/2018  1:39 PM  Vitals shown include unvalidated device data.  Last Pain:  Vitals:   03/27/18 1332  TempSrc: Oral  PainSc:          Complications: No apparent anesthesia complications

## 2018-03-27 NOTE — H&P (Signed)
[name]  Physician Assistant  Cardiology  Progress Notes    Signed  Encounter Date:  03/26/2018       Related encounter: Office Visit from 03/26/2018 in [facility] Office      Signed      Expand All Collapse All      Show:Clear all [x] Manual[x] Template[x] Copied  Added by: [x] Liliane Shi, PA-C   [] Hover for details    Cardiology Office Note:    Date:  03/26/2018   ID:  [name], DOB July 10, 1931, MRN [id number]  PCP:  [name], MD            Cardiologist:  [name], MD   Electrophysiologist:  [name], MD     Referring MD: [name], MD       Chief Complaint  Patient presents with  . Follow-up    CHF, AFib     History of Present Illness:    [name] is a 84 y.o. male with coronary artery disease status post CABGin 2010. He has undergone multiple PCIprocedures since. He underwentstenting of the vein graft to the OM1in 2017, DES to ostial and distal portions of the S-R PDA in 07/2017. Other hx includesatrial fibrillation,aortic stenosis status post TAVR in September 2017, complete heart block status postCRT-P (Medtr[employer] MRI compatible), combined systolic and diastolic heart failure, hypertension, hyperlipidemia, orthostatic hypotension. [place] ruled in [place]-STEMIin 10/2017. His LVF was worse on Echo with an EF of 20-25%. Cardiac catheterization demonstrated high-grade in-stent restenosis in the vein graft to the RCA which was treated with balloon angioplasty. His heart failure therapy has been limited by syncope related to orthostatic hypotension.   He has recently been noted to be in atrial fibrillation and was placed on Apixaban.  He has had more issues with volume excess since being in atrial fibrillation.  Last seen 03/11/18.    Mr. [name] returns for follow up.  He is here with his wife.  He remains weak and short  of breath with mild activities.  He denies chest pain, syncope, paroxysmal nocturnal dyspnea.  He continues to have significant leg swelling up to his thighs.  It may have been slightly better when he was on the Lasix 80 mg Twice daily.    Prior CV studies:   The following studies were reviewed today:  Cardiac catheterization 11/12/2017([facility], [place], [place]) LVEDP 16 EF 50% LM occluded RCA occluded SVG-RCA patent with mid stent with subtotal ISR SVG-OM patent stent LIMA-LAD patent PCI: POBA to the SVG-RCA  Echo 11/13/2017([facility], [place], [place]) EF 20-25, diffuse HK, mild MR, normally functioning aortic valve prosthesis, mild to moderate TR  Cardiac Catheterization12/20/18 LM ostial 100 RCA proximal 100-CTO SVG-RPDA ostial 90, distal 90 SVG-OM1 stent patent LIMA-LAD patent EF 40-45 Mean PCWP 20; LVEDP 22 PCI: 4 x 12 mm Resolute Onyx DES to the ostial/proximal SVG-RPDA  PCI: 3 x 22 mm Resolute Onyx DES to the distal SVG-RPDA Recommend: Continue clopidogrel without interruption, add aspirin 81 mg daily, diurese overnight with a single dose of IV Lasix and then continue oral furosemide. If no complications arise should be okay for discharge tomorrow.  Carotid US 05/14/17 L CEA patent; RICA 40-59  Echo 05/09/17 Inferior akinesis, mild LVH, EF 40, status post TAVR with no perivalvular regurgitation, MAC, mild MR, moderate LAE, mild RAE, PASP 36  Echo 06/22/16 Mild LVH, EF 40-45, diffuse HK, grade 2  diastolic dysfunction, status post TAVR with no significant stenosis or perivalvular regurgitation, MAC, trivial MR, mild LAE, mild RAE, PASP 39      Past Medical History:  Diagnosis Date  . Arthritis   . Benign prostatic hypertrophy   . CAD (coronary artery disease)    DES, SVG to circumflex marginal, October, 2010 ( SVG to PDA and PLA patent , LIMA to LAD patent, EF 60%, mild inferior hypokinesis 12/18 PCI/DES to SVG-PDA  x2, EF 40% // NSTEMI 3/19 [facility]) >> S-OM and L-LAD ok; S-RCA stent sub-total ISR >> PCI:  POBA  . Chronic systolic CHF (congestive heart failure) ([place])    Echo 9/18: Inferior akinesis, mild LVH, EF 40, status post TAVR with no perivalvular regurgitation, MAC, mild MR, moderate LAE, mild RAE, PASP 36 // Echo 3/19: EF 20-25, mild MR, AVR ok, mild to mod TR // Echo 6/19: Mild LVH, EF 30-35, diffuse HK, inferolateral and inferior AK, status post TAVR, no stenosis, no AI (mean gradient 5), MAC, mild MR, severe LAE, normal RVSF, moderate TR, PASP 50   . Complete heart block ([place])    a. s/p MDT CRTP 04/2016 Dr [name]  . GERD (gastroesophageal reflux disease)   . History of kidney stones 1970   /notes 01/03/2011  . HTN (hypertension)   . Hyperlipidemia   . Hypothyroidism   . Neuromuscular disorder ([place])   . Peripheral neuropathy   . Persistent atrial fibrillation ([place])   . Psoriasis    severe; with total skin exfoliation in the past resolved  . S/P TAVR (transcatheter aortic valve replacement)    severe AS >> s/p TAVR 9/17 // Limited Echo 10/17: EF 40-45%, lat and inf-lat HK, TAVR ok, MAC, mild BAE, PASP 31 mmHg, no effusion  . Ventricular tachycardia, non-sustained (HCC)    Surgical Hx: The patient  has a past surgical history that includes decompression of left median nerve; Colonoscopy (11/23/2011); Tonsillectomy; Appendectomy; Total knee arthroplasty (04/16/2012); Cataract extraction, bilateral; Carpal tunnel release (Bilateral, 2009-2011); left heart catheterization with coronary angiogram (N/A, 12/27/2012); TEE without cardioversion (N/A, 04/21/2016); Cardiac catheterization (N/A, 04/27/2016); Transcatheter aortic valve replacement, transfemoral (N/A, 05/16/2016); TEE without cardioversion (N/A, 05/16/2016); Cardiac catheterization (N/A, 11/17/2015); Cardiac catheterization (N/A, 11/17/2015); Cardiac catheterization (N/A, 04/19/2016); Coronary angioplasty with stent; Coronary angioplasty with  stent (08/09/2017); Coronary angioplasty with stent (11/17/2015); Lumbar laminectomy (Bilateral, 09/2004); Carotid endarterectomy (Left, 02/2008); Synovial cyst excision (09/2004); Back surgery; Joint replacement; Lumbar disc surgery; Anterior cervical decomp/discectomy fusion; Kidney stone surgery; Coronary artery bypass graft (1995); RIGHT/LEFT HEART CATH AND CORONARY/GRAFT ANGIOGRAPHY (N/A, 08/09/2017); and CORONARY STENT INTERVENTION (N/A, 08/09/2017).   Current Medications: ActiveMedications      Current Meds  Medication Sig  . allopurinol (ZYLOPRIM) 300 MG tablet Take 300 mg by mouth daily.  Marland Kitchen apixaban (ELIQUIS) 5 MG TABS tablet Take 1 tablet (5 mg total) by mouth 2 (two) times daily.  Marland Kitchen atorvastatin (LIPITOR) 20 MG tablet Take 20 mg by mouth every evening.   . docusate sodium (COLACE) 100 MG capsule Take 100 mg by mouth daily as needed for moderate constipation.   . fluticasone (FLONASE) 50 MCG/ACT nasal spray Place 1 spray into both nostrils daily.   Marland Kitchen latanoprost (XALATAN) 0.005 % ophthalmic solution Place 1 drop into both eyes at bedtime.  Marland Kitchen levothyroxine (SYNTHROID, LEVOTHROID) 112 MCG tablet Take 112 mcg by mouth daily before breakfast.  . Magnesium 250 MG TABS Take 250 mg by mouth 2 (two) times daily.   . metoprolol tartrate (LOPRESSOR) 25 MG tablet Take  0.5 tablets (12.5 mg total) by mouth every evening.  . multivitamin (THERAGRAN) per tablet Take 1 tablet by mouth at bedtime.   . nitroGLYCERIN (NITROSTAT) 0.4 MG SL tablet Place 0.4 mg under the tongue every 5 (five) minutes x 3 doses as needed for chest pain.   . potassium chloride SA (KLOR-CON M20) 20 MEQ tablet Take 1 tablet (20 mEq total) by mouth 2 (two) times daily.  Marland Kitchen pyridOXINE (VITAMIN B-6) 100 MG tablet Take 100 mg by mouth daily.   . Tamsulosin HCl (FLOMAX) 0.4 MG CAPS Take 0.4 mg by mouth daily.   . [DISCONTINUED] amoxicillin (AMOXIL) 500 MG tablet Take 500 mg by mouth as needed (Prior to dental appt.).  .  [DISCONTINUED] furosemide (LASIX) 40 MG tablet Take 2 tablets (80 mg total) by mouth as directed. 80 mg twice daily for 3 days; then change to 80 mg in the AM and 40 mg in the PM  . [DISCONTINUED] Methylcellulose, Laxative, (CITRUCEL PO) Take 1 each by mouth See admin instructions. Take 1 tablespoon at night   . [DISCONTINUED] silver sulfADIAZINE (SILVADENE) 1 % cream Apply 1 application topically 2 (two) times daily.       Allergies:   Other and Propylene glycol   Social History        Tobacco Use  . Smoking status: Never Smoker  . Smokeless tobacco: Never Used  Substance Use Topics  . Alcohol use: Yes    Alcohol/week: 0.6 oz    Types: 1 Cans of beer per week  . Drug use: No     Family Hx: The patient's family history includes Heart attack in his mother; Heart attack (age of onset: 35) in his son; Heart disease in his father and mother; Hyperlipidemia in his father; Hypertension in his father. There is no history of Colon cancer.  ROS:   Please see the history of present illness.    Review of Systems  Cardiovascular: Positive for dyspnea on exertion and leg swelling.   All other systems reviewed and are negative.   EKGs/Labs/Other Test Reviewed:    EKG:  EKG is not ordered today.   Recent Labs: 08/07/2017: NT-Pro BNP 2,488 03/07/2018: ALT 16; Hemoglobin 10.8; Platelets 138 03/18/2018: BUN 28; Creatinine, Ser 1.34; Potassium 4.0; Sodium 141   Recent Lipid Panel Labs(Brief)       Lab Results  Component Value Date/Time   CHOL 143 08/06/2006 08:14 AM   TRIG 141 08/06/2006 08:14 AM   HDL 29.7 (L) 08/06/2006 08:14 AM   CHOLHDL 4.8 CALC 08/06/2006 08:14 AM   LDLCALC 85 08/06/2006 08:14 AM      Physical Exam:    VS:  BP 140/80   Pulse 98   Ht 5' 8"  (1.727 m)   Wt 194 lb 9.6 oz (88.3 kg)   SpO2 94%   BMI 29.59 kg/m        Wt Readings from Last 3 Encounters:  03/26/18 194 lb 9.6 oz (88.3 kg)  03/26/18 194 lb 9.6 oz (88.3 kg)  03/11/18  193 lb (87.5 kg)     Physical Exam  Constitutional: He is oriented to person, place, and time. He appears well-developed and well-nourished. No distress.  HENT:  Head: Normocephalic and atraumatic.  Eyes: No scleral icterus.  Neck: Neck supple.  Cardiovascular: Normal rate. An irregularly irregular rhythm present.  Murmur heard.  Systolic murmur is present with a grade of 2/6 at the upper right sternal border and upper left sternal border. Pulmonary/Chest: Effort normal. He  has no wheezes. He has no rales.  Abdominal: He exhibits distension.  Musculoskeletal: He exhibits edema (2+ bilat LE edema up to the thighs).  Neurological: He is alert and oriented to person, place, and time.  Skin: Skin is warm and dry.  Psychiatric: He has a normal mood and affect.    ASSESSMENT & PLAN:    Acute on chronic combined systolic and diastolic congestive heart failure (HCC)  EF 20-25.  NYHA 3.  He remains significantly volume overloaded.  Device interrogation shows that his Optivol is increased again.  He is likely having more difficulty with volume excess due to atrial fibrillation (loss of atrial kick). Hopefully, his volume management will improve with restoration of normal sinus rhythm.  I will arrange DCCV this week.  His CHF therapy has been limited by orthostatic hypotension.  His BP is improved.  Hopefully, we can intensify his CHF management in the near future.  For now, continue low dose beta-blocker.  I will adjust his diuresis further.             -Continue Metoprolol tartrate once daily             -Increase Lasix to 80 mg Twice daily             -BMET today             -Close follow up next week             -If volume does not improve with restoration of NSR, he may need to be admitted for IV diuresis.  Persistent atrial fibrillation [facility]) Device interrogation today demonstrates persistent AFib.  He has been adherent with Eliquis for 4 weeks.  Risks and benefits of proceeding with  cardioversion were discussed with the patient. He agrees to proceed.              -Arrange cardioversion in the next 1-2 days  Coronary artery disease involving coronary bypass graft of native heart without angina pectoris Prior history of CABG and subsequent PCI of the SVG-OM1, drug-eluting stent x2 to the SVG-RCA in December 2018 and balloon angioplasty of the SVG-RCA in March 2019.  No angina.  Continue statin, beta-blocker.  Severe Aortic Stenosis S/P TAVR (transcatheter aortic valve replacement) Stable bioprosthetic aortic valve by echo 3/19.  Continue SBE prophylaxis.  Biventricular cardiac pacemaker in situ Continue follow up with EP as planned.     Dispo:  Return in about 1 week (around 04/02/2018) for Post Procedure Follow Up, w/ Dr. Burt Knack, or [name], PA-C.   Medication Adjustments/Labs and Tests Ordered: Current medicines are reviewed at length with the patient today.  Concerns regarding medicines are outlined above.  Tests Ordered:    Orders Placed This Encounter  Procedures  . Basic Metabolic Panel (BMET)   Medication Changes:     Meds ordered this encounter  Medications  . furosemide (LASIX) 80 MG tablet    Sig: Take 1 tablet (80 mg total) by mouth 2 (two) times daily.    Dispense:  180 tablet    Refill:  3    DOSE INCREASE    Signed, [name], PA-C  03/26/2018 1:25 PM    [facility] [place], [place], [place]  [address] Phone: [phone]; Fax: [phone]             Cosigned by: [name], MD at 03/26/2018 5:53 PM   For DCCV; compliant with apixaban; no changes Kirk Ruths

## 2018-03-27 NOTE — Anesthesia Preprocedure Evaluation (Signed)
Anesthesia Evaluation  Patient identified by MRN, date of birth, ID band Patient awake    Reviewed: Allergy & Precautions, NPO status , Patient's Chart, lab work & pertinent test results  History of Anesthesia Complications Negative for: history of anesthetic complications  Airway Mallampati: II  TM Distance: >3 FB Neck ROM: Full    Dental  (+) Teeth Intact, Dental Advisory Given   Pulmonary shortness of breath,    breath sounds clear to auscultation (-) decreased breath sounds      Cardiovascular hypertension, + CAD, + Cardiac Stents, + CABG, + Peripheral Vascular Disease and +CHF  + dysrhythmias Atrial Fibrillation + pacemaker  Rhythm:Irregular - Systolic murmurs 9/1 TEE: Left ventricle: Mild LVH. Systolic function was mildly reduced. The estimated ejection fraction was in the range of 45% to 50%. Inferior and inferolateral hypokinesis. - Aortic valve: Sclerotic, rheumatic-appearing leaflets with severely reduced excursion. The base of the leaflets are calcified. There is severe stenosis. AVA is around 0.8-0.9 cm2. Peak and mean gradients of 29 and 47 mmHg. AVA by planimetry was 0.88 cm2. There was heavy aliasing of color doppler in the ascending aorta suggestive of severe aortic stenosis and high velocity jet. There is trivial aortic insufficiency. Mean gradient (S): 29 mm Hg. Peak gradient (S): 47 mm Hg. - Aorta: Grade 2 atheroma of the aortic arch. - Mitral valve: Mildly thickened leaflets . There was mild regurgitation. - Left atrium: The atrium was dilated. No evidence of thrombus in the atrial cavity or appendage. - Pulmonary veins: No anomaly. - Atrial septum: No defect or patent foramen ovale was identified. - Pulmonic valve: No evidence of vegetation.   Neuro/Psych  Neuromuscular disease    GI/Hepatic Neg liver ROS, GERD  ,  Endo/Other  Hypothyroidism   Renal/GU Renal disease      Musculoskeletal  (+) Arthritis ,   Abdominal   Peds  Hematology  (+) anemia ,   Anesthesia Other Findings   Reproductive/Obstetrics                             Anesthesia Physical Anesthesia Plan  ASA: III  Anesthesia Plan: General   Post-op Pain Management:    Induction: Intravenous  PONV Risk Score and Plan: 2 and Treatment may vary due to age or medical condition  Airway Management Planned: Mask  Additional Equipment:   Intra-op Plan:   Post-operative Plan:   Informed Consent: I have reviewed the patients History and Physical, chart, labs and discussed the procedure including the risks, benefits and alternatives for the proposed anesthesia with the patient or authorized representative who has indicated his/her understanding and acceptance.   Dental advisory given  Plan Discussed with: CRNA and Surgeon  Anesthesia Plan Comments:         Anesthesia Quick Evaluation

## 2018-03-27 NOTE — Interval H&P Note (Signed)
History and Physical Interval Note:  03/27/2018 12:33 PM  [name]  has presented today for surgery, with the diagnosis of afib  The various methods of treatment have been discussed with the patient and family. After consideration of risks, benefits and other options for treatment, the patient has consented to  Procedure(s): CARDIOVERSION (N/A) as a surgical intervention .  The patient's history has been reviewed, patient examined, no change in status, stable for surgery.  I have reviewed the patient's chart and labs.  Questions were answered to the patient's satisfaction.     Kirk Ruths

## 2018-03-27 NOTE — Discharge Instructions (Signed)
Electrical Cardioversion, Care After °This sheet gives you information about how to care for yourself after your procedure. Your health care provider may also give you more specific instructions. If you have problems or questions, contact your health care provider. °What can I expect after the procedure? °After the procedure, it is common to have: °· Some redness on the skin where the shocks were given. ° °Follow these instructions at home: °· Do not drive for 24 hours if you were given a medicine to help you relax (sedative). °· Take over-the-counter and prescription medicines only as told by your health care provider. °· Ask your health care provider how to check your pulse. Check it often. °· Rest for 48 hours after the procedure or as told by your health care provider. °· Avoid or limit your caffeine use as told by your health care provider. °Contact a health care provider if: °· You feel like your heart is beating too quickly or your pulse is not regular. °· You have a serious muscle cramp that does not go away. °Get help right away if: °· You have discomfort in your chest. °· You are dizzy or you feel faint. °· You have trouble breathing or you are short of breath. °· Your speech is slurred. °· You have trouble moving an arm or leg on one side of your body. °· Your fingers or toes turn cold or blue. °This information is not intended to replace advice given to you by your health care provider. Make sure you discuss any questions you have with your health care provider. °Document Released: 05/28/2013 Document Revised: 03/10/2016 Document Reviewed: 02/11/2016 °Elsevier Interactive Patient Education © 2018 [employer]. ° °

## 2018-03-27 NOTE — Anesthesia Procedure Notes (Signed)
Procedure Name: General with mask airway Date/Time: 03/27/2018 1:27 PM Performed by: Kyung Rudd, CRNA Pre-anesthesia Checklist: Patient identified, Emergency Drugs available, Suction available, Patient being monitored and Timeout performed Patient Re-evaluated:Patient Re-evaluated prior to induction Oxygen Delivery Method: Ambu bag Preoxygenation: Pre-oxygenation with 100% oxygen Induction Type: IV induction Ventilation: Mask ventilation without difficulty Dental Injury: Teeth and Oropharynx as per pre-operative assessment

## 2018-03-27 NOTE — Procedures (Signed)
Electrical Cardioversion Procedure Note Secundino Ellithorpe [id number] 09-30-1930  Procedure: Electrical Cardioversion Indications:  Atrial Fibrillation  Procedure Details Consent: Risks of procedure as well as the alternatives and risks of each were explained to the (patient/caregiver).  Consent for procedure obtained. Time Out: Verified patient identification, verified procedure, site/side was marked, verified correct patient position, special equipment/implants available, medications/allergies/relevent history reviewed, required imaging and test results available.  Performed  Patient placed on cardiac monitor, pulse oximetry, supplemental oxygen as necessary.  Sedation given: Pt sedated by anesthesia with diprovan 50 mg IV. Pacer pads placed anterior and posterior chest.  Cardioverted 1 time(s).  Cardioverted at 120J.  Evaluation Findings: Post procedure EKG shows: NSR with V pacing Complications: None Patient did tolerate procedure well.   Kirk Ruths 03/27/2018, 12:32 PM

## 2018-03-27 NOTE — Anesthesia Postprocedure Evaluation (Signed)
Anesthesia Post Note  Patient: [name].  Procedure(s) Performed: CARDIOVERSION (N/A )     Patient location during evaluation: Endoscopy Anesthesia Type: General Level of consciousness: awake and alert Pain management: pain level controlled Vital Signs Assessment: post-procedure vital signs reviewed and stable Respiratory status: spontaneous breathing, nonlabored ventilation, respiratory function stable and patient connected to nasal cannula oxygen Cardiovascular status: blood pressure returned to baseline and stable Postop Assessment: no apparent nausea or vomiting Anesthetic complications: no    Last Vitals:  Vitals:   03/27/18 1332 03/27/18 1340  BP: 127/68 (!) 133/58  Pulse: 65 80  Resp: (!) 23 19  Temp: 36.5 C   SpO2: 96% 92%    Last Pain:  Vitals:   03/27/18 1332  TempSrc: Oral  PainSc:                  [name]

## 2018-03-28 ENCOUNTER — Telehealth: Payer: Self-pay | Admitting: *Deleted

## 2018-03-28 NOTE — Telephone Encounter (Signed)
I left message for the pt that we changed his appt time for 8/13 from 8:30 to 8:15.  When original appt was made for 8:30 this was in fact double booking the PA. Our 8:15 time appt time opened and I have moved the pt to 8:15. I lmom appt time change to 8:15. Any questions please call [phone].

## 2018-03-28 NOTE — Telephone Encounter (Signed)
-----   Message from Liliane Shi, [place] sent at 03/27/2018 12:50 PM EDT ----- The following labs are stable without significant clinical change:  Creatinine. All other results are normal or within acceptable limits. Medication changes / Follow up labs / Other changes or recommendations:   - Continue current medications and follow up as planned.  [name], PA-C 03/27/2018 12:50 PM

## 2018-03-28 NOTE — Telephone Encounter (Signed)
DPR ok to leave message on machine. Left message lab work stable. Continue on current Tx plan. Follow up with [name], PA 04/02/18 @ 8:30 am . If any questions please call [phone].

## 2018-03-29 NOTE — Telephone Encounter (Signed)
-----   Message from Liliane Shi, [place] sent at 03/27/2018 12:50 PM EDT ----- The following labs are stable without significant clinical change:  Creatinine. All other results are normal or within acceptable limits. Medication changes / Follow up labs / Other changes or recommendations:   - Continue current medications and follow up as planned.  [name], PA-C 03/27/2018 12:50 PM

## 2018-03-29 NOTE — Telephone Encounter (Signed)
Left message to go over results.

## 2018-04-01 ENCOUNTER — Other Ambulatory Visit: Payer: Self-pay | Admitting: Internal Medicine

## 2018-04-01 NOTE — Telephone Encounter (Signed)
Pt last saw [name] 03/26/18, last labs 03/26/18 Creat 1.40, age 84, weight 88.3kg, based on specified criteria pt is on appropriate dosage of Eliquis 5mg  BID.  Will refill rx.

## 2018-04-01 NOTE — Progress Notes (Signed)
Cardiology Office Note:    Date:  04/02/2018   ID:  [name], DOB 1931/07/19, MRN [id number]  PCP:  [name], MD  Cardiologist:  [name], MD   Referring MD: [name], MD   Chief Complaint  Patient presents with  . Follow-up    CHF; AFib s/p DCCV    History of Present Illness:    [name]. is a 84 y.o. male with coronary artery disease s/p CABG in 2010, atrial fibrillation, aortic stenosis s/p TAVR, (04/2016), complete heart block s/p CRT-P ([employer] - MRI compatible), combined systolic and diastolic CHF, orthostatic hypotension.  He has had multiple PCI procedures since his coronary artery bypass grafting.  He underwent stenting to the S-OM1 in 2017, DES to the ostial and distal portions of the S-RPDA in 07/2017 and, most recently, POBA to the [facility] in the setting of a NSTEMI ([place], [place]) in 3/19.  His LV function has worsened and his EF is now 20-25%. He has had worsening heart failure symptoms in the setting of new onset atrial fibrillation.  He was last seen 03/26/18 and was set up for a cardioversion.  Of note, he remained volume overloaded.  His DCCV was 03/27/18 and he had successful restoration of normal sinus rhythm.    [name] returns for follow-up after recent cardioversion.  He is here with his wife.  Over the last couple of days, he feels much better.  His weight is down about 4 pounds.  He notes less shortness of breath.  He notes less fatigue.  His legs are somewhat smaller but still significantly swollen.  He denies syncope, PND.  He denies chest pain.  He did get choked on something a few days ago.  He had significant coughing and developed some hemoptysis.  This seems to have resolved.  He denies any fevers.  Prior CV studies:   The following studies were reviewed today:  Cardiac catheterization 11/12/2017([facility], [place], [place]) LVEDP 16 EF 50% LM occluded RCA occluded SVG-RCA patent with mid stent with  subtotal ISR SVG-OM patent stent LIMA-LAD patent PCI: POBA to the SVG-RCA  Echo 11/13/2017([facility], [place], [place]) EF 20-25, diffuse HK, mild MR, normally functioning aortic valve prosthesis, mild to moderate TR  Cardiac Catheterization12/20/18 LM ostial 100 RCA proximal 100-CTO SVG-RPDA ostial 90, distal 90 SVG-OM1 stent patent LIMA-LAD patent EF 40-45 Mean PCWP 20; LVEDP 22 PCI: 4 x 12 mm Resolute Onyx DES to the ostial/proximal SVG-RPDA  PCI: 3 x 22 mm Resolute Onyx DES to the distal SVG-RPDA Recommend: Continue clopidogrel without interruption, add aspirin 81 mg daily, diurese overnight with a single dose of IV Lasix and then continue oral furosemide. If no complications arise should be okay for discharge tomorrow.  Carotid US 05/14/17 L CEA patent; RICA 40-59  Echo 05/09/17 Inferior akinesis, mild LVH, EF 40, status post TAVR with no perivalvular regurgitation, MAC, mild MR, moderate LAE, mild RAE, PASP 36  Echo 06/22/16 Mild LVH, EF 40-45, diffuse HK, grade 2 diastolic dysfunction, status post TAVR with no significant stenosis or perivalvular regurgitation, MAC, trivial MR, mild LAE, mild RAE, PASP 39  Past Medical History:  Diagnosis Date  . Arthritis   . Benign prostatic hypertrophy   . CAD (coronary artery disease)    DES, SVG to circumflex marginal, October, 2010 ( SVG to PDA and PLA patent , LIMA to LAD patent, EF 60%, mild inferior hypokinesis 12/18 PCI/DES to SVG-PDA x2,  EF 40% // NSTEMI 3/19 [facility]) >> S-OM and L-LAD ok; S-RCA stent sub-total ISR >> PCI:  POBA  . Chronic systolic CHF (congestive heart failure) ([place])    Echo 9/18: Inferior akinesis, mild LVH, EF 40, status post TAVR with no perivalvular regurgitation, MAC, mild MR, moderate LAE, mild RAE, PASP 36 // Echo 3/19: EF 20-25, mild MR, AVR ok, mild to mod TR // Echo 6/19: Mild LVH, EF 30-35, diffuse HK, inferolateral and inferior AK, status post TAVR, no stenosis, no AI (mean  gradient 5), MAC, mild MR, severe LAE, normal RVSF, moderate TR, PASP 50   . Complete heart block ([place])    a. s/p MDT CRTP 04/2016 Dr [name]  . GERD (gastroesophageal reflux disease)   . History of kidney stones 1970   /notes 01/03/2011  . HTN (hypertension)   . Hyperlipidemia   . Hypothyroidism   . Neuromuscular disorder ([place])   . Peripheral neuropathy   . Persistent atrial fibrillation ([place])   . Psoriasis    severe; with total skin exfoliation in the past resolved  . S/P TAVR (transcatheter aortic valve replacement)    severe AS >> s/p TAVR 9/17 // Limited Echo 10/17: EF 40-45%, lat and inf-lat HK, TAVR ok, MAC, mild BAE, PASP 31 mmHg, no effusion  . Ventricular tachycardia, non-sustained (HCC)    Surgical Hx: The patient  has a past surgical history that includes decompression of left median nerve; Colonoscopy (11/23/2011); Tonsillectomy; Appendectomy; Total knee arthroplasty (04/16/2012); Cataract extraction, bilateral; Carpal tunnel release (Bilateral, 2009-2011); left heart catheterization with coronary angiogram (N/A, 12/27/2012); TEE without cardioversion (N/A, 04/21/2016); Cardiac catheterization (N/A, 04/27/2016); Transcatheter aortic valve replacement, transfemoral (N/A, 05/16/2016); TEE without cardioversion (N/A, 05/16/2016); Cardiac catheterization (N/A, 11/17/2015); Cardiac catheterization (N/A, 11/17/2015); Cardiac catheterization (N/A, 04/19/2016); Coronary angioplasty with stent; Coronary angioplasty with stent (08/09/2017); Coronary angioplasty with stent (11/17/2015); Lumbar laminectomy (Bilateral, 09/2004); Carotid endarterectomy (Left, 02/2008); Synovial cyst excision (09/2004); Back surgery; Joint replacement; Lumbar disc surgery; Anterior cervical decomp/discectomy fusion; Kidney stone surgery; Coronary artery bypass graft (1995); RIGHT/LEFT HEART CATH AND CORONARY/GRAFT ANGIOGRAPHY (N/A, 08/09/2017); CORONARY STENT INTERVENTION (N/A, 08/09/2017); and Cardioversion (N/A, 03/27/2018).    Current Medications: Current Meds  Medication Sig  . allopurinol (ZYLOPRIM) 300 MG tablet Take 300 mg by mouth daily.  Marland Kitchen apixaban (ELIQUIS) 5 MG TABS tablet Take 1 tablet (5 mg total) by mouth 2 (two) times daily.  Marland Kitchen atorvastatin (LIPITOR) 20 MG tablet Take 20 mg by mouth every evening.   . docusate sodium (COLACE) 100 MG capsule Take 100 mg by mouth daily as needed for moderate constipation.   . fluticasone (FLONASE) 50 MCG/ACT nasal spray Place 1 spray into both nostrils daily.   Marland Kitchen latanoprost (XALATAN) 0.005 % ophthalmic solution Place 1 drop into both eyes at bedtime.  Marland Kitchen levothyroxine (SYNTHROID, LEVOTHROID) 112 MCG tablet Take 112 mcg by mouth daily before breakfast.  . Magnesium 250 MG TABS Take 250 mg by mouth 2 (two) times daily.   . multivitamin (THERAGRAN) per tablet Take 1 tablet by mouth at bedtime.   . nitroGLYCERIN (NITROSTAT) 0.4 MG SL tablet Place 0.4 mg under the tongue every 5 (five) minutes x 3 doses as needed for chest pain.   Marland Kitchen pyridOXINE (VITAMIN B-6) 100 MG tablet Take 100 mg by mouth daily.   . Tamsulosin HCl (FLOMAX) 0.4 MG CAPS Take 0.4 mg by mouth daily.   . [DISCONTINUED] ELIQUIS 5 MG TABS tablet TAKE 1 TABLET BY MOUTH TWICE A DAY  . [DISCONTINUED] furosemide (LASIX)  80 MG tablet Take 1 tablet (80 mg total) by mouth 2 (two) times daily.  . [DISCONTINUED] metoprolol tartrate (LOPRESSOR) 25 MG tablet Take 0.5 tablets (12.5 mg total) by mouth every evening.  . [DISCONTINUED] potassium chloride SA (KLOR-CON M20) 20 MEQ tablet Take 1 tablet (20 mEq total) by mouth 2 (two) times daily.     Allergies:   Sulfamethoxazole; Other; and Propylene glycol   Social History   Tobacco Use  . Smoking status: Never Smoker  . Smokeless tobacco: Never Used  Substance Use Topics  . Alcohol use: Yes    Alcohol/week: 1.0 standard drinks    Types: 1 Cans of beer per week  . Drug use: No     Family Hx: The patient's family history includes Heart attack in his mother; Heart  attack (age of onset: 15) in his son; Heart disease in his father and mother; Hyperlipidemia in his father; Hypertension in his father. There is no history of Colon cancer.  ROS:   Please see the history of present illness.    ROS All other systems reviewed and are negative.   EKGs/Labs/Other Test Reviewed:    EKG:  EKG is  ordered today.  The ekg ordered today demonstrates ventricular paced, underlying sinus rhythm, frequent PVCs, heart rate 89  Recent Labs: 08/07/2017: NT-Pro BNP 2,488 03/07/2018: ALT 16; Hemoglobin 10.8; Platelets 138 03/26/2018: BUN 26; Creatinine, Ser 1.40; Potassium 3.8; Sodium 142   Recent Lipid Panel Lab Results  Component Value Date/Time   CHOL 143 08/06/2006 08:14 AM   TRIG 141 08/06/2006 08:14 AM   HDL 29.7 (L) 08/06/2006 08:14 AM   CHOLHDL 4.8 CALC 08/06/2006 08:14 AM   LDLCALC 85 08/06/2006 08:14 AM    Physical Exam:    VS:  BP 110/76   Pulse 89   Ht _0  (1.727 m)   Wt 190 lb 1.9 oz (86.2 kg)   SpO2 97%   BMI 28.91 kg/m     Wt Readings from Last 3 Encounters:  04/02/18 190 lb 1.9 oz (86.2 kg)  03/27/18 197 lb (89.4 kg)  03/26/18 194 lb 9.6 oz (88.3 kg)     Physical Exam  Constitutional: He is oriented to person, place, and time. He appears well-developed and well-nourished. No distress.  HENT:  Head: Normocephalic and atraumatic.  Eyes: No scleral icterus.  Neck: No JVD present.  Cardiovascular: Normal rate. A regularly irregular rhythm present.  Murmur heard.  Systolic murmur is present with a grade of 2/6 at the upper right sternal border. Pulmonary/Chest: Effort normal. He has no wheezes. He has no rales.  Abdominal: Soft. He exhibits distension.  Musculoskeletal: He exhibits edema (2+ bilat LE edema up to the thigh (smaller than last visit)).  Neurological: He is alert and oriented to person, place, and time.  Skin: Skin is warm and dry.  Psychiatric: He has a normal mood and affect.    ASSESSMENT & PLAN:    Acute on  chronic combined systolic and diastolic congestive heart failure (HCC) EF 20-25.  NYHA 3.  Since his cardioversion, he has shown some improvement in his volume status.  He tells me that over the past few days, he feels the best he has felt in several months.  I question whether or not he is having adequate response to furosemide.  I think it is worthwhile to try him on torsemide to see if he has better diuresis.  I would also like to try to maximize his medical therapy for heart  failure.  We previously had cut back on his beta-blocker and ARB secondary to orthostatic hypotension.  Hopefully he can tolerate twice daily metoprolol.  -DC Lasix  -Start torsemide 40 mg in the morning, 20 mg in the afternoon  -Increase potassium to 40 mg once in the morning, 20 mg once in the afternoon  -Increase metoprolol tartrate to 12.5 mg twice daily  -BMET today; repeat 1 week  -Continue follow-up in the [facility] clinic  -Follow-up with Dr. Burt Knack 04/24/2018 as planned  Persistent atrial fibrillation ([place]) His EKG demonstrates underlying sinus rhythm today.  He does have frequent PVCs.  As noted, I will obtain a BMET today.  Continue Eliquis.  He did have recent hemoptysis.  I will check a CBC today and a chest x-ray.  I have advised him to contact us if this continues or worsens.  If he has recurrent atrial fibrillation, we will need to consider antiarrhythmic therapy (amiodarone).  Coronary artery disease involving coronary bypass graft of native heart without angina pectoris Prior CABG and subsequent PCI of the SVG-OM1, DES x2 to the SVG-RCA and balloon angioplasty of the SVG-RCA.  He denies angina.  He is not on aspirin as he is on Apixaban.  Continue statin, beta-blocker.  Biventricular cardiac pacemaker in situ Continue follow-up in the [facility] clinic as planned.  Hemoptysis I suspect that this was related to forceful coughing in the setting of anticoagulation.  However, I will obtain a CBC today to rule out  leukocytosis, worsening anemia.  I will also obtain a chest x-ray to rule out pneumonia.  As he has recently undergone cardioversion, we cannot stop anticoagulation at this time.   Dispo:  Return in about 22 days (around 04/24/2018) for Scheduled Follow Up, w/ Dr. Burt Knack.   Medication Adjustments/Labs and Tests Ordered: Current medicines are reviewed at length with the patient today.  Concerns regarding medicines are outlined above.  Tests Ordered: Orders Placed This Encounter  Procedures  . DG Chest 2 View  . Basic Metabolic Panel (BMET)  . CBC  . Basic Metabolic Panel (BMET)  . EKG 12-Lead   Medication Changes: Meds ordered this encounter  Medications  . torsemide (DEMADEX) 20 MG tablet    Sig: Take 2 tabs in the AM and 1 tab in the PM    Dispense:  180 tablet    Refill:  3    DOSE CHANGE  . metoprolol tartrate (LOPRESSOR) 25 MG tablet    Sig: Take 0.5 tablets (12.5 mg total) by mouth 2 (two) times daily.    Dispense:  180 tablet    Refill:  3    DOSE CHANGE  . potassium chloride SA (KLOR-CON M20) 20 MEQ tablet    Sig: Take 2 tabs in the AM and 1 tab in the PM    Dispense:  135 tablet    Refill:  3    DOSE CHANGE  . apixaban (ELIQUIS) 5 MG TABS tablet    Sig: Take 1 tablet (5 mg total) by mouth 2 (two) times daily.    Dispense:  180 tablet    Refill:  5    Signed, [name], PA-C  04/02/2018 1:20 PM    [facility] [address], [place], [place]  [address] Phone: [phone]; Fax: [phone]

## 2018-04-02 ENCOUNTER — Ambulatory Visit: Payer: Medicare HMO | Admitting: Physician Assistant

## 2018-04-02 ENCOUNTER — Ambulatory Visit
Admission: RE | Admit: 2018-04-02 | Discharge: 2018-04-02 | Disposition: A | Discharge status: Home / Self Care | Payer: Medicare HMO | Source: Ambulatory Visit | Attending: Physician Assistant | Admitting: Physician Assistant

## 2018-04-02 ENCOUNTER — Telehealth: Payer: Self-pay | Admitting: *Deleted

## 2018-04-02 ENCOUNTER — Encounter: Payer: Self-pay | Admitting: Physician Assistant

## 2018-04-02 VITALS — BP 110/76 | HR 89 | Ht 68.0 in | Wt 190.1 lb

## 2018-04-02 DIAGNOSIS — Z950 Presence of cardiac pacemaker: Secondary | ICD-10-CM

## 2018-04-02 DIAGNOSIS — R042 Hemoptysis: Secondary | ICD-10-CM

## 2018-04-02 DIAGNOSIS — I4819 Other persistent atrial fibrillation: Secondary | ICD-10-CM

## 2018-04-02 DIAGNOSIS — I5043 Acute on chronic combined systolic (congestive) and diastolic (congestive) heart failure: Secondary | ICD-10-CM | POA: Diagnosis not present

## 2018-04-02 DIAGNOSIS — I481 Persistent atrial fibrillation: Secondary | ICD-10-CM | POA: Diagnosis not present

## 2018-04-02 DIAGNOSIS — R918 Other nonspecific abnormal finding of lung field: Secondary | ICD-10-CM | POA: Diagnosis not present

## 2018-04-02 DIAGNOSIS — I25810 Atherosclerosis of coronary artery bypass graft(s) without angina pectoris: Secondary | ICD-10-CM | POA: Diagnosis not present

## 2018-04-02 LAB — BASIC METABOLIC PANEL
BUN / CREAT RATIO: 20 (ref 10–24)
BUN: 22 mg/dL (ref 8–27)
CO2: 28 mmol/L (ref 20–29)
CREATININE: 1.11 mg/dL (ref 0.76–1.27)
Calcium: 9.5 mg/dL (ref 8.6–10.2)
Chloride: 97 mmol/L (ref 96–106)
GFR calc Af Amer: 70 mL/min/{1.73_m2} (ref >59)
GFR calc non Af Amer: 60 mL/min/{1.73_m2} (ref >59)
Glucose: 100 mg/dL — ABNORMAL HIGH (ref 65–99)
Potassium: 3.8 mmol/L (ref 3.5–5.2)
Sodium: 141 mmol/L (ref 134–144)

## 2018-04-02 LAB — CBC
HEMATOCRIT: 33.2 % — AB (ref 37.5–51.0)
Hemoglobin: 10.9 g/dL — ABNORMAL LOW (ref 13.0–17.7)
MCH: 31.4 pg (ref 26.6–33.0)
MCHC: 32.8 g/dL (ref 31.5–35.7)
MCV: 96 fL (ref 79–97)
Platelets: 145 10*3/uL — ABNORMAL LOW (ref 150–450)
RBC: 3.47 x10E6/uL — AB (ref 4.14–5.80)
RDW: 16.4 % — AB (ref 12.3–15.4)
WBC: 5.6 10*3/uL (ref 3.4–10.8)

## 2018-04-02 MED ORDER — APIXABAN 5 MG PO TABS: 5.0000 mg | ORAL_TABLET | Freq: Two times a day (BID) | ORAL | 5 refills | Status: DC

## 2018-04-02 MED ORDER — METOPROLOL TARTRATE 25 MG PO TABS: 12.5000 mg | ORAL_TABLET | Freq: Two times a day (BID) | ORAL | 3 refills | Status: DC

## 2018-04-02 MED ORDER — AMOXICILLIN 500 MG PO TABS: 1000.0000 mg | ORAL_TABLET | Freq: Three times a day (TID) | ORAL | 0 refills | Status: DC

## 2018-04-02 MED ORDER — POTASSIUM CHLORIDE CRYS ER 20 MEQ PO TBCR: EXTENDED_RELEASE_TABLET | ORAL | 3 refills | Status: DC

## 2018-04-02 MED ORDER — DOXYCYCLINE HYCLATE 100 MG PO TABS: 100.0000 mg | ORAL_TABLET | Freq: Two times a day (BID) | ORAL | 0 refills | Status: DC

## 2018-04-02 MED ORDER — TORSEMIDE 20 MG PO TABS: ORAL_TABLET | ORAL | 3 refills | Status: DC

## 2018-04-02 NOTE — Telephone Encounter (Signed)
-----   Message from Liliane Shi, [place] sent at 04/02/2018  4:39 PM EDT ----- The following labs are stable without significant clinical change:  hemoglobin  Creatinine, potassium are normal. All other results are normal or within acceptable limits. Medication changes / Follow up labs / Other changes or recommendations:    - see CXR results -  Poss pneumonia; he needs to start antibiotics.  - otherwise, continue current medications and follow up as planned (repeat BMET 04/09/18).  [name], PA-C 04/02/2018 4:37 PM

## 2018-04-02 NOTE — Telephone Encounter (Signed)
Pt has been adivsed of lab and CXR results from today. Pt aware possible pneumonia and the need to start antibiotics. Amoxicillin 1000 mg TID x 7 days; Doxycycline 100 mg BID x 7 days. Pt aware if return of hemoptysis or worsening cough he is to f/u with Dr. Willey Blade. I will forward all results from today to PCP Dr. [name]. Pt thanked me for the call and all of our help.

## 2018-04-02 NOTE — Patient Instructions (Addendum)
Medication Instructions:  1. STOP LASIX   2. START TORSEMIDE 20 MG TABLET WITH THE DIRECTIONS TO TAKE 2 TABS IN THE MORNING AND 1 TAB IN THE PM  3. INCREASE POTASSIUM 20 MEQ TABLET WITH THE DIRECTIONS TO TAKE 2 TABS IN THE MORNING AND 1 TAB IN THE PM  4. INCREASE METOPROLOL TARTRATE TO 12.5 MG TWICE DAILY  5. A REFILL HAS BEEN SENT IN FOR THE ELIQUIS   Labwork: 1. TODAY BMET, CBC   2. BMET TO BE DONE IN 1 WEEK   Testing/Procedures: CHEST X-RAY TO B DONE TODAY AT [facility]  [place]   Follow-Up: KEEP YOUR APPT WITH DR. Burt Knack ON 04/24/18 @ 1:40 PM   Any Other Special Instructions Will Be Listed Below (If Applicable).     If you need a refill on your cardiac medications before your next appointment, please call your pharmacy.

## 2018-04-04 ENCOUNTER — Telehealth: Payer: Self-pay | Admitting: *Deleted

## 2018-04-04 ENCOUNTER — Ambulatory Visit (INDEPENDENT_AMBULATORY_CARE_PROVIDER_SITE_OTHER): Payer: Medicare HMO

## 2018-04-04 DIAGNOSIS — Z950 Presence of cardiac pacemaker: Secondary | ICD-10-CM | POA: Diagnosis not present

## 2018-04-04 DIAGNOSIS — I5042 Chronic combined systolic (congestive) and diastolic (congestive) heart failure: Secondary | ICD-10-CM | POA: Diagnosis not present

## 2018-04-04 NOTE — Telephone Encounter (Signed)
Left message for pt:  PT WILL F/U WITH DR. ALLRED 05/13/18.. CX 04/18/18 APPT WITH AMBER SEILER, NP PER SCOTT WEAVER, PAC.Marland KitchenMarland KitchenCMF

## 2018-04-04 NOTE — Telephone Encounter (Signed)
-----   Message from Liliane Shi, [place] sent at 04/04/2018  8:14 AM EDT ----- Regarding: cancel appt with Amber 8/29 Please cancel appt with Chanetta Marshall, NP on 04/18/18

## 2018-04-05 NOTE — Progress Notes (Signed)
EPIC Encounter for ICM Monitoring  Patient Name: [name]. is a 84 y.o. male Date: 04/05/2018 Primary Care Physican: [name], MD Primary Cardiologist:Cooper/Weaver, PA Electrophysiologist:Allred Dry Weight:191lbs Bi-V Pacing:   84.6%          Heart Failure questions reviewed, pt asymptomatic.   Cardioversion 03/27/18.  Patient has pneumonia and taking antibiotics.  He reported as far as his heart goes, he feels the best he has felt in a long time.   Thoracic impedance normal.  Prescribed dosage: Torsemide 20 mg take 2 tabs (40 mg total) every morning and 1 tablet (20 mg) every evening.  Potassium 20 mEq take 2 tabs (40 mEq total) every morning and 1 tab (20 mEq total) every evening.   Labs: 04/02/2018 Creatinine 1.11, BUN 22, Potassium 3.8, Sodium 141, EGFR 60-70 03/26/2018 Creatinine 1.40, BUN 26, Potassium 3.8, Sodium 142, EGFR 46-53 03/18/2018 Creatinine 1.34, BUN 28, Potassium 4.0, Sodium 141, EGFR 48-56 03/07/2018 Creatinine 1.38, BUN 28, Potassium 4.5, Sodium 141, EGFR 47-54 02/27/2018 Creatinine 1.29, BUN 28, Potassium 4.1, Sodium 140, EGFR 49-57  02/12/2018 Creatinine 1.37, BUN 30, Potassium 4.2, Sodium 141, EGFR 47-54  02/04/2018 Creatinine 1.38, BUN 28, Potassium 4.3, Sodium 143, EGFR 47-54  02/01/2018 Creatinine 1.34, BUN 21, Potassium 4.3, Sodium 142, EGFR 48-56  12/28/2017 Creatinine 1.24, BUN 30, Potassium 4.5, Sodium 138, EGFR 52-60 A complete set of results can be found in results review.  Recommendations: No changes.   Encouraged to call for fluid symptoms.  Follow-up plan: ICM clinic phone appointment on 05/06/2018.   Office appointment scheduled 04/24/2018 with Dr. Burt Knack and 05/13/2018 with Dr [name].    Copy of ICM check sent to Dr. [name].   3 month ICM trend: 04/04/2018    AT/AF   1 Year ICM trend:       Rosalene Billings, RN 04/05/2018 8:00 AM

## 2018-04-09 ENCOUNTER — Other Ambulatory Visit: Payer: Medicare HMO

## 2018-04-09 DIAGNOSIS — I5043 Acute on chronic combined systolic (congestive) and diastolic (congestive) heart failure: Secondary | ICD-10-CM

## 2018-04-09 LAB — BASIC METABOLIC PANEL
BUN / CREAT RATIO: 19 (ref 10–24)
BUN: 22 mg/dL (ref 8–27)
CO2: 27 mmol/L (ref 20–29)
CREATININE: 1.16 mg/dL (ref 0.76–1.27)
Calcium: 9.8 mg/dL (ref 8.6–10.2)
Chloride: 97 mmol/L (ref 96–106)
GFR calc Af Amer: 66 mL/min/{1.73_m2} (ref >59)
GFR calc non Af Amer: 57 mL/min/{1.73_m2} — ABNORMAL LOW (ref >59)
Glucose: 97 mg/dL (ref 65–99)
Potassium: 4 mmol/L (ref 3.5–5.2)
Sodium: 142 mmol/L (ref 134–144)

## 2018-04-16 DIAGNOSIS — I5043 Acute on chronic combined systolic (congestive) and diastolic (congestive) heart failure: Secondary | ICD-10-CM | POA: Diagnosis not present

## 2018-04-16 DIAGNOSIS — J189 Pneumonia, unspecified organism: Secondary | ICD-10-CM | POA: Diagnosis not present

## 2018-04-18 ENCOUNTER — Encounter: Payer: Medicare HMO | Admitting: Nurse Practitioner

## 2018-04-24 ENCOUNTER — Ambulatory Visit: Payer: Medicare HMO | Admitting: Cardiovascular Disease

## 2018-04-24 ENCOUNTER — Encounter: Payer: Self-pay | Admitting: Cardiovascular Disease

## 2018-04-24 VITALS — BP 114/60 | HR 77 | Ht 68.0 in | Wt 179.0 lb

## 2018-04-24 DIAGNOSIS — I5042 Chronic combined systolic (congestive) and diastolic (congestive) heart failure: Secondary | ICD-10-CM

## 2018-04-24 DIAGNOSIS — I25810 Atherosclerosis of coronary artery bypass graft(s) without angina pectoris: Secondary | ICD-10-CM | POA: Diagnosis not present
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
## 2018-04-24 NOTE — Patient Instructions (Addendum)
Medication Instructions:  Your provider recommends that you continue on your current medications as directed. Please refer to the Current Medication list given to you today.    Labwork: None  Testing/Procedures: None  Follow-Up: You have an appointment scheduled with [name] on 07/24/2018 at 1:45PM.  Your provider wants you to follow-up in: 6 months with Dr. Burt Knack. You will receive a reminder letter in the mail two months in advance. If you don't receive a letter, please call our office to schedule the follow-up appointment.    Any Other Special Instructions Will Be Listed Below (If Applicable).     If you need a refill on your cardiac medications before your next appointment, please call your pharmacy.

## 2018-05-06 ENCOUNTER — Telehealth: Payer: Self-pay | Admitting: Cardiology

## 2018-05-06 ENCOUNTER — Ambulatory Visit (INDEPENDENT_AMBULATORY_CARE_PROVIDER_SITE_OTHER): Payer: Medicare HMO

## 2018-05-06 DIAGNOSIS — I5042 Chronic combined systolic (congestive) and diastolic (congestive) heart failure: Secondary | ICD-10-CM | POA: Diagnosis not present

## 2018-05-06 DIAGNOSIS — Z950 Presence of cardiac pacemaker: Secondary | ICD-10-CM

## 2018-05-06 NOTE — Telephone Encounter (Signed)
Spoke with pt and reminded pt of remote transmission that is due today. Pt verbalized understanding.   

## 2018-05-06 NOTE — Progress Notes (Signed)
EPIC Encounter for ICM Monitoring  Patient Name: [name]. is a 84 y.o. male Date: 05/06/2018 Primary Care Physican: [name], MD Primary Cardiologist:Cooper/Weaver, PA Electrophysiologist:Allred Dry Weight:177lbs Bi-V Pacing:92.6%        Heart Failure questions reviewed, pt asymptomatic.  Patient reported feeling good.  He said now that he has lost the fluid weight over the last couple of months, his breathing and stamina for walking is so much better.    Thoracic impedance normal.  Prescribed: Torsemide 20 mg take 2 tabs (40 mg total) every morning and 1 tablet (20 mg) every evening.  Potassium 20 mEq take 2 tabs (40 mEq total) every morning and 1 tab (20 mEq total) every evening.   Labs: 04/02/2018 Creatinine 1.11, BUN 22, Potassium 3.8, Sodium 141, EGFR 60-70 03/26/2018 Creatinine 1.40, BUN 26, Potassium 3.8, Sodium 142, EGFR 46-53 03/18/2018 Creatinine 1.34, BUN 28, Potassium 4.0, Sodium 141, EGFR 48-56 03/07/2018 Creatinine1.38, BUN28, Potassium4.5, Sodium141, [id number] 02/27/2018 Creatinine1.29, BUN28, Potassium4.1, Sodium140, [id number]  02/12/2018 Creatinine1.37, BUN30, Potassium4.2, Sodium141, [id number]  02/04/2018 Creatinine1.38, BUN28, Potassium4.3, [id number], [id number]  02/01/2018 Creatinine1.34, BUN21, Potassium4.3, [id number], [id number] 12/28/2017 Creatinine 1.24, BUN 30, Potassium 4.5, Sodium 138, EGFR 52-60 A complete set of results can be found in results review.  Recommendations: No changes.   Encouraged to call for fluid symptoms.  Follow-up plan: ICM clinic phone appointment on 06/13/2018.   Office appointment scheduled 05/13/2018 with Dr. [name].    Copy of ICM check sent to Dr. [name].   3 month ICM trend: 05/06/2018    AT/AF    1 Year ICM trend:       Rosalene Billings, RN 05/06/2018 1:30 PM

## 2018-05-07 ENCOUNTER — Ambulatory Visit (HOSPITAL_COMMUNITY)
Admission: RE | Admit: 2018-05-07 | Discharge: 2018-05-07 | Disposition: A | Discharge status: Home / Self Care | Payer: Medicare HMO | Source: Ambulatory Visit | Attending: Internal Medicine | Admitting: Internal Medicine

## 2018-05-07 ENCOUNTER — Inpatient Hospital Stay (HOSPITAL_COMMUNITY): Payer: Medicare HMO | Attending: Hematology

## 2018-05-07 ENCOUNTER — Other Ambulatory Visit (HOSPITAL_COMMUNITY): Payer: Self-pay | Admitting: Internal Medicine

## 2018-05-07 DIAGNOSIS — I10 Essential (primary) hypertension: Secondary | ICD-10-CM | POA: Diagnosis not present

## 2018-05-07 DIAGNOSIS — J188 Other pneumonia, unspecified organism: Secondary | ICD-10-CM | POA: Insufficient documentation

## 2018-05-07 DIAGNOSIS — K5900 Constipation, unspecified: Secondary | ICD-10-CM | POA: Diagnosis not present

## 2018-05-07 DIAGNOSIS — D649 Anemia, unspecified: Secondary | ICD-10-CM | POA: Diagnosis not present

## 2018-05-07 DIAGNOSIS — R05 Cough: Secondary | ICD-10-CM | POA: Diagnosis not present

## 2018-05-07 LAB — COMPREHENSIVE METABOLIC PANEL
ALT: 25 U/L (ref 0–44)
AST: 38 U/L (ref 15–41)
Albumin: 4.1 g/dL (ref 3.5–5.0)
Alkaline Phosphatase: 111 U/L (ref 38–126)
Anion gap: 9 (ref 5–15)
BUN: 32 mg/dL — AB (ref 8–23)
CHLORIDE: 101 mmol/L (ref 98–111)
CO2: 30 mmol/L (ref 22–32)
Calcium: 9.5 mg/dL (ref 8.9–10.3)
Creatinine, Ser: 1.28 mg/dL — ABNORMAL HIGH (ref 0.61–1.24)
GFR calc Af Amer: 57 mL/min — ABNORMAL LOW (ref >60)
GFR calc non Af Amer: 49 mL/min — ABNORMAL LOW (ref >60)
Glucose, Bld: 118 mg/dL — ABNORMAL HIGH (ref 70–99)
POTASSIUM: 3.8 mmol/L (ref 3.5–5.1)
Sodium: 140 mmol/L (ref 135–145)
Total Bilirubin: 0.9 mg/dL (ref 0.3–1.2)
Total Protein: 7.4 g/dL (ref 6.5–8.1)

## 2018-05-07 LAB — CBC WITH DIFFERENTIAL/PLATELET
BASOS ABS: 0 10*3/uL (ref 0.0–0.1)
Basophils Relative: 0 %
Eosinophils Absolute: 0 10*3/uL (ref 0.0–0.7)
Eosinophils Relative: 0 %
HEMATOCRIT: 39.8 % (ref 39.0–52.0)
Hemoglobin: 11.4 g/dL — ABNORMAL LOW (ref 13.0–17.0)
LYMPHS ABS: 0.8 10*3/uL (ref 0.7–4.0)
Lymphocytes Relative: 9 %
MCH: 32.9 pg (ref 26.0–34.0)
MCHC: 28.6 g/dL — ABNORMAL LOW (ref 30.0–36.0)
MCV: 114.7 fL — ABNORMAL HIGH (ref 78.0–100.0)
MONOS PCT: 8 %
Monocytes Absolute: 0.7 10*3/uL (ref 0.1–1.0)
NEUTROS PCT: 83 %
Neutro Abs: 7.8 10*3/uL — ABNORMAL HIGH (ref 1.7–7.7)
Platelets: 100 10*3/uL — ABNORMAL LOW (ref 150–400)
RBC: 3.47 MIL/uL — AB (ref 4.22–5.81)
RDW: 16.8 % — AB (ref 11.5–15.5)
WBC: 9.3 10*3/uL (ref 4.0–10.5)

## 2018-05-07 LAB — IRON AND TIBC
Iron: 61 ug/dL (ref 45–182)
SATURATION RATIOS: 24 % (ref 17.9–39.5)
TIBC: 258 ug/dL (ref 250–450)
UIBC: 197 ug/dL

## 2018-05-07 LAB — FERRITIN: Ferritin: 430 ng/mL — ABNORMAL HIGH (ref 24–336)

## 2018-05-07 LAB — VITAMIN B12: VITAMIN B 12: 567 pg/mL (ref 180–914)

## 2018-05-13 ENCOUNTER — Encounter: Payer: Self-pay | Admitting: Internal Medicine

## 2018-05-13 ENCOUNTER — Ambulatory Visit (INDEPENDENT_AMBULATORY_CARE_PROVIDER_SITE_OTHER): Payer: Medicare HMO | Admitting: Internal Medicine

## 2018-05-13 VITALS — BP 122/70 | HR 73 | Ht 68.0 in | Wt 175.8 lb

## 2018-05-13 DIAGNOSIS — I441 Atrioventricular block, second degree: Secondary | ICD-10-CM | POA: Diagnosis not present

## 2018-05-13 DIAGNOSIS — I4729 Other ventricular tachycardia: Secondary | ICD-10-CM

## 2018-05-13 DIAGNOSIS — I5042 Chronic combined systolic (congestive) and diastolic (congestive) heart failure: Secondary | ICD-10-CM

## 2018-05-13 DIAGNOSIS — Z23 Encounter for immunization: Secondary | ICD-10-CM | POA: Diagnosis not present

## 2018-05-13 DIAGNOSIS — R001 Bradycardia, unspecified: Secondary | ICD-10-CM | POA: Diagnosis not present

## 2018-05-13 DIAGNOSIS — I481 Persistent atrial fibrillation: Secondary | ICD-10-CM

## 2018-05-13 DIAGNOSIS — I472 Ventricular tachycardia: Secondary | ICD-10-CM

## 2018-05-13 DIAGNOSIS — I4819 Other persistent atrial fibrillation: Secondary | ICD-10-CM

## 2018-05-13 DIAGNOSIS — Z950 Presence of cardiac pacemaker: Secondary | ICD-10-CM | POA: Diagnosis not present

## 2018-05-13 NOTE — Patient Instructions (Addendum)
Medication Instructions:  Your physician recommends that you continue on your current medications as directed. Please refer to the Current Medication list given to you today.  Labwork: None ordered.  Testing/Procedures: None ordered.  Follow-Up: Your physician wants you to follow-up in: 6 months with Chanetta Marshall, NP.   You will receive a reminder letter in the mail two months in advance. If you don't receive a letter, please call our office to schedule the follow-up appointment.  Remote monitoring is used to monitor your Pacemaker from home. This monitoring reduces the number of office visits required to check your device to one time per year. It allows [place] to keep an eye on the functioning of your device to ensure it is working properly. You are scheduled for a device check from home on 06/13/2018. You may send your transmission at any time that day. If you have a wireless device, the transmission will be sent automatically. After your physician reviews your transmission, you will receive a postcard with your next transmission date.  Any Other Special Instructions Will Be Listed Below (If Applicable).  If you need a refill on your cardiac medications before your next appointment, please call your pharmacy.
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
## 2018-05-14 ENCOUNTER — Inpatient Hospital Stay (HOSPITAL_BASED_OUTPATIENT_CLINIC_OR_DEPARTMENT_OTHER): Payer: Medicare HMO | Admitting: Internal Medicine

## 2018-05-14 ENCOUNTER — Other Ambulatory Visit: Payer: Self-pay

## 2018-05-14 ENCOUNTER — Encounter (HOSPITAL_COMMUNITY): Payer: Self-pay | Admitting: Internal Medicine

## 2018-05-14 VITALS — BP 112/71 | HR 74 | Temp 97.6°F | Resp 20 | Wt 181.8 lb

## 2018-05-14 DIAGNOSIS — I10 Essential (primary) hypertension: Secondary | ICD-10-CM | POA: Diagnosis not present

## 2018-05-14 DIAGNOSIS — K5900 Constipation, unspecified: Secondary | ICD-10-CM | POA: Diagnosis not present

## 2018-05-14 DIAGNOSIS — D649 Anemia, unspecified: Secondary | ICD-10-CM | POA: Diagnosis not present
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
## 2018-05-29 LAB — CUP PACEART INCLINIC DEVICE CHECK
Battery Remaining Longevity: 78 mo
Brady Statistic AP VP Percent: 28.5 %
Brady Statistic AP VS Percent: 0.8 %
Brady Statistic AS VP Percent: 61.8 %
Brady Statistic AS VS Percent: 8.9 %
Date Time Interrogation Session: 20191009120748
Implantable Lead Implant Date: 20170907
Implantable Lead Implant Date: 20170907
Implantable Lead Location: 753858
Implantable Lead Location: 753860
Implantable Lead Model: 4598
Lead Channel Impedance Value: 360 Ohm
Lead Channel Impedance Value: 399 Ohm
Lead Channel Impedance Value: 551 Ohm
Lead Channel Pacing Threshold Amplitude: 0.75 V
Lead Channel Pacing Threshold Pulse Width: 0.4 ms
Lead Channel Pacing Threshold Pulse Width: 0.8 ms
Lead Channel Sensing Intrinsic Amplitude: 2.8 mV
Lead Channel Setting Pacing Amplitude: 2 V
Lead Channel Setting Sensing Sensitivity: 0.9 mV
MDC IDC LEAD IMPLANT DT: 20170907
MDC IDC LEAD LOCATION: 753859
MDC IDC MSMT LEADCHNL LV PACING THRESHOLD AMPLITUDE: 1.25 V
MDC IDC MSMT LEADCHNL RV PACING THRESHOLD AMPLITUDE: 1 V
MDC IDC MSMT LEADCHNL RV PACING THRESHOLD PULSEWIDTH: 0.4 ms
MDC IDC MSMT LEADCHNL RV SENSING INTR AMPL: 10.3 mV
MDC IDC PG IMPLANT DT: 20170907
MDC IDC SET LEADCHNL LV PACING AMPLITUDE: 2.25 V
MDC IDC SET LEADCHNL LV PACING PULSEWIDTH: 0.8 ms
MDC IDC SET LEADCHNL RV PACING AMPLITUDE: 2.5 V
MDC IDC SET LEADCHNL RV PACING PULSEWIDTH: 0.4 ms

## 2018-05-30 DIAGNOSIS — Z79899 Other long term (current) drug therapy: Secondary | ICD-10-CM | POA: Diagnosis not present

## 2018-05-30 DIAGNOSIS — I482 Chronic atrial fibrillation, unspecified: Secondary | ICD-10-CM | POA: Diagnosis not present

## 2018-05-30 DIAGNOSIS — I5022 Chronic systolic (congestive) heart failure: Secondary | ICD-10-CM | POA: Diagnosis not present

## 2018-05-31 ENCOUNTER — Ambulatory Visit (HOSPITAL_COMMUNITY)
Admission: RE | Admit: 2018-05-31 | Discharge: 2018-05-31 | Disposition: A | Discharge status: Home / Self Care | Payer: Medicare HMO | Source: Ambulatory Visit | Attending: Internal Medicine | Admitting: Internal Medicine

## 2018-05-31 ENCOUNTER — Other Ambulatory Visit (HOSPITAL_COMMUNITY): Payer: Self-pay | Admitting: Internal Medicine

## 2018-05-31 DIAGNOSIS — R071 Chest pain on breathing: Secondary | ICD-10-CM

## 2018-05-31 DIAGNOSIS — R05 Cough: Secondary | ICD-10-CM | POA: Diagnosis not present

## 2018-06-06 DIAGNOSIS — I10 Essential (primary) hypertension: Secondary | ICD-10-CM | POA: Diagnosis not present

## 2018-06-06 DIAGNOSIS — D649 Anemia, unspecified: Secondary | ICD-10-CM | POA: Diagnosis not present

## 2018-06-06 DIAGNOSIS — I5042 Chronic combined systolic (congestive) and diastolic (congestive) heart failure: Secondary | ICD-10-CM | POA: Diagnosis not present

## 2018-06-13 ENCOUNTER — Ambulatory Visit (INDEPENDENT_AMBULATORY_CARE_PROVIDER_SITE_OTHER): Payer: Medicare HMO

## 2018-06-13 ENCOUNTER — Ambulatory Visit (INDEPENDENT_AMBULATORY_CARE_PROVIDER_SITE_OTHER): Payer: Medicare HMO | Admitting: *Deleted

## 2018-06-13 DIAGNOSIS — Z950 Presence of cardiac pacemaker: Secondary | ICD-10-CM | POA: Diagnosis not present

## 2018-06-13 DIAGNOSIS — I441 Atrioventricular block, second degree: Secondary | ICD-10-CM

## 2018-06-13 DIAGNOSIS — I5042 Chronic combined systolic (congestive) and diastolic (congestive) heart failure: Secondary | ICD-10-CM

## 2018-06-13 DIAGNOSIS — R55 Syncope and collapse: Secondary | ICD-10-CM

## 2018-06-13 NOTE — Progress Notes (Signed)
EPIC Encounter for ICM Monitoring  Patient Name: [name]. is a 84 y.o. male Date: 06/13/2018 Primary Care Physican: [name], MD Primary Cardiologist:Cooper/Weaver, PA Electrophysiologist:Allred Dry Weight:Previous weight 177lbs Bi-V Pacing:86.3%      Attempted call to patient and unable to reach.  Left detailed message, per DPR, regarding transmission.  Transmission reviewed.    Thoracic impedance returning close to baseline.   Prescribed: Torsemide20 mg take 2 tabs (40 mg total) every morning and 1 tablet (20 mg) every evening. Potassium 20 mEq take 2 tabs (40 mEq total) every morning and 1 tab (20 mEq total) every evening.   Labs: 04/02/2018 Creatinine 1.11, BUN 22, Potassium 3.8, Sodium 141, EGFR 60-70 03/26/2018 Creatinine 1.40, BUN 26, Potassium 3.8, Sodium 142, EGFR 46-53 03/18/2018 Creatinine 1.34, BUN 28, Potassium 4.0, Sodium 141, EGFR 48-56 03/07/2018 Creatinine1.38, BUN28, Potassium4.5, Sodium141, [id number] 02/27/2018 Creatinine1.29, BUN28, Potassium4.1, Sodium140, [id number]  02/12/2018 Creatinine1.37, BUN30, Potassium4.2, Sodium141, [id number]  02/04/2018 Creatinine1.38, BUN28, Potassium4.3, [id number], [id number]  02/01/2018 Creatinine1.34, BUN21, Potassium4.3, [id number], [id number] 12/28/2017 Creatinine 1.24, BUN 30, Potassium 4.5, Sodium 138, EGFR 52-60 A complete set of results can be found in results review.  Recommendations: Left voice mail with ICM number and encouraged to call if experiencing any fluid symptoms.  Follow-up plan: ICM clinic phone appointment on 07/15/2018.   Office appointment scheduled 07/25/2018 with [name], PA.    Copy of ICM check sent to Dr. [name].   3 month ICM trend: 06/13/2018    1 Year ICM trend:       Rosalene Billings, RN 06/13/2018 12:40 PM

## 2018-06-13 NOTE — Progress Notes (Signed)
Remote pacemaker transmission.   

## 2018-06-14 ENCOUNTER — Telehealth: Payer: Self-pay

## 2018-06-14 NOTE — Telephone Encounter (Signed)
Remote ICM transmission received.  Attempted call to patient regarding ICM remote transmission and left detailed message, per DPR, with next ICM remote transmission date of 07/15/2018.  Advised to return call for any fluid symptoms or questions.    

## 2018-07-05 LAB — CUP PACEART REMOTE DEVICE CHECK
Brady Statistic AP VP Percent: 54.04 %
Brady Statistic AP VS Percent: 1.2 %
Brady Statistic AS VS Percent: 14.52 %
Brady Statistic RV Percent Paced: 84.28 %
Date Time Interrogation Session: 20191020041926
Implantable Lead Implant Date: 20170907
Implantable Lead Implant Date: 20170907
Implantable Lead Implant Date: 20170907
Implantable Lead Location: 753860
Implantable Lead Model: 5076
Implantable Lead Model: 5076
Implantable Pulse Generator Implant Date: 20170907
Lead Channel Impedance Value: 285 Ohm
Lead Channel Impedance Value: 399 Ohm
Lead Channel Impedance Value: 399 Ohm
Lead Channel Impedance Value: 532 Ohm
Lead Channel Impedance Value: 532 Ohm
Lead Channel Impedance Value: 589 Ohm
Lead Channel Pacing Threshold Amplitude: 1.125 V
Lead Channel Pacing Threshold Amplitude: 1.125 V
Lead Channel Pacing Threshold Pulse Width: 0.4 ms
Lead Channel Pacing Threshold Pulse Width: 0.4 ms
Lead Channel Sensing Intrinsic Amplitude: 2.25 mV
Lead Channel Sensing Intrinsic Amplitude: 8.375 mV
Lead Channel Sensing Intrinsic Amplitude: 8.375 mV
Lead Channel Setting Pacing Amplitude: 2 V
Lead Channel Setting Pacing Amplitude: 2.25 V
Lead Channel Setting Pacing Pulse Width: 0.4 ms
Lead Channel Setting Sensing Sensitivity: 0.9 mV
MDC IDC LEAD LOCATION: 753858
MDC IDC LEAD LOCATION: 753859
MDC IDC MSMT BATTERY REMAINING LONGEVITY: 78 mo
MDC IDC MSMT BATTERY VOLTAGE: 2.97 V
MDC IDC MSMT LEADCHNL LV IMPEDANCE VALUE: 285 Ohm
MDC IDC MSMT LEADCHNL LV IMPEDANCE VALUE: 323 Ohm
MDC IDC MSMT LEADCHNL LV IMPEDANCE VALUE: 342 Ohm
MDC IDC MSMT LEADCHNL LV IMPEDANCE VALUE: 627 Ohm
MDC IDC MSMT LEADCHNL LV IMPEDANCE VALUE: 646 Ohm
MDC IDC MSMT LEADCHNL LV PACING THRESHOLD PULSEWIDTH: 0.8 ms
MDC IDC MSMT LEADCHNL RA IMPEDANCE VALUE: 361 Ohm
MDC IDC MSMT LEADCHNL RA PACING THRESHOLD AMPLITUDE: 0.75 V
MDC IDC MSMT LEADCHNL RA SENSING INTR AMPL: 2.25 mV
MDC IDC MSMT LEADCHNL RV IMPEDANCE VALUE: 304 Ohm
MDC IDC MSMT LEADCHNL RV IMPEDANCE VALUE: 399 Ohm
MDC IDC SET LEADCHNL LV PACING PULSEWIDTH: 0.8 ms
MDC IDC SET LEADCHNL RV PACING AMPLITUDE: 2.5 V
MDC IDC STAT BRADY AS VP PERCENT: 30.24 %
MDC IDC STAT BRADY RA PERCENT PACED: 62.76 %

## 2018-07-15 ENCOUNTER — Ambulatory Visit (INDEPENDENT_AMBULATORY_CARE_PROVIDER_SITE_OTHER): Payer: Medicare HMO

## 2018-07-15 DIAGNOSIS — Z950 Presence of cardiac pacemaker: Secondary | ICD-10-CM

## 2018-07-15 DIAGNOSIS — I5042 Chronic combined systolic (congestive) and diastolic (congestive) heart failure: Secondary | ICD-10-CM

## 2018-07-15 DIAGNOSIS — R69 Illness, unspecified: Secondary | ICD-10-CM | POA: Diagnosis not present

## 2018-07-15 NOTE — Progress Notes (Signed)
EPIC Encounter for ICM Monitoring  Patient Name: [name]. is a 84 y.o. male Date: 07/15/2018 Primary Care Physican: [name], MD Primary Cardiologist:Cooper/Weaver, PA Electrophysiologist:Allred Bi-V Pacing:84.1%   Last Weight: 177lbs Today's Weight: 176 lbs        Heart Failure questions reviewed, pt asymptomatic.  He has days he does not feel like doing much during the day.     Thoracic impedance normal.   Prescribed: Torsemide20 mg take 2 tabs (40 mg total) every morning and 1 tablet (20 mg) every evening. Potassium 20 mEq take 2 tabs (40 mEq total) every morning and 1 tab (20 mEq total) every evening.   Labs: 04/02/2018 Creatinine 1.11, BUN 22, Potassium 3.8, Sodium 141, EGFR 60-70 03/26/2018 Creatinine 1.40, BUN 26, Potassium 3.8, Sodium 142, EGFR 46-53 03/18/2018 Creatinine 1.34, BUN 28, Potassium 4.0, Sodium 141, EGFR 48-56 03/07/2018 Creatinine1.38, BUN28, Potassium4.5, Sodium141, [id number] 02/27/2018 Creatinine1.29, BUN28, Potassium4.1, Sodium140, [id number]  02/12/2018 Creatinine1.37, BUN30, Potassium4.2, Sodium141, [id number]  02/04/2018 Creatinine1.38, BUN28, Potassium4.3, [id number], [id number]  02/01/2018 Creatinine1.34, BUN21, Potassium4.3, [id number], [id number] 12/28/2017 Creatinine 1.24, BUN 30, Potassium 4.5, Sodium 138, EGFR 52-60 A complete set of results can be found in results review.  Recommendations: No changes.   Encouraged to call for fluid symptoms.  Follow-up plan: ICM clinic phone appointment on 08/15/2018.   Office appointment scheduled 07/25/2018 with [name], PA.    Copy of ICM check sent to Dr. [name].   3 month ICM trend: 07/15/2018    1 Year ICM trend:       Rosalene Billings, RN 07/15/2018 2:12 PM

## 2018-07-24 ENCOUNTER — Ambulatory Visit: Payer: Medicare HMO | Admitting: Physician Assistant

## 2018-07-25 ENCOUNTER — Other Ambulatory Visit: Payer: Self-pay | Admitting: *Deleted

## 2018-07-25 ENCOUNTER — Encounter: Payer: Self-pay | Admitting: Physician Assistant

## 2018-07-25 ENCOUNTER — Ambulatory Visit: Payer: Medicare HMO | Admitting: Physician Assistant

## 2018-07-25 VITALS — BP 122/56 | HR 56 | Ht 68.0 in | Wt 182.8 lb

## 2018-07-25 DIAGNOSIS — Z950 Presence of cardiac pacemaker: Secondary | ICD-10-CM | POA: Insufficient documentation

## 2018-07-25 DIAGNOSIS — I739 Peripheral vascular disease, unspecified: Secondary | ICD-10-CM

## 2018-07-25 DIAGNOSIS — I5042 Chronic combined systolic (congestive) and diastolic (congestive) heart failure: Secondary | ICD-10-CM | POA: Diagnosis not present

## 2018-07-25 DIAGNOSIS — Z952 Presence of prosthetic heart valve: Secondary | ICD-10-CM

## 2018-07-25 DIAGNOSIS — I4819 Other persistent atrial fibrillation: Secondary | ICD-10-CM | POA: Diagnosis not present

## 2018-07-25 DIAGNOSIS — N63 Unspecified lump in unspecified breast: Secondary | ICD-10-CM

## 2018-07-25 DIAGNOSIS — I25810 Atherosclerosis of coronary artery bypass graft(s) without angina pectoris: Secondary | ICD-10-CM

## 2018-07-25 DIAGNOSIS — I779 Disorder of arteries and arterioles, unspecified: Secondary | ICD-10-CM

## 2018-07-25 NOTE — Patient Instructions (Signed)
Medication Instructions:  1. Your physician recommends that you continue on your current medications as directed. Please refer to the Current Medication list given to you today.  If you need a refill on your cardiac medications before your next appointment, please call your pharmacy.   Lab work: TODAY BMET, CBC  If you have labs (blood work) drawn today and your tests are completely normal, you will receive your results only by: Marland Kitchen MyChart Message (if you have MyChart) OR . A paper copy in the mail If you have any lab test that is abnormal or we need to change your treatment, we will call you to review the results.  Testing/Procedures: YOU NEED TO HAVE A MAMMOGRAM DONE AT [place]: At [facility], you and your health needs are our priority.  As part of our continuing mission to provide you with exceptional heart care, we have created designated Provider Care Teams.  These Care Teams include your primary Cardiologist (physician) and Advanced Practice Providers (APPs -  Physician Assistants and Nurse Practitioners) who all work together to provide you with the care you need, when you need it. You will need a follow up appointment in:  3 months.  Please call our office 2 months in advance to schedule this appointment.  You may see [name], MD or one of the following Advanced Practice Providers on your designated Care Team: [name], PA-C [place], [place] . Daune Perch, NP  Any Other Special Instructions Will Be Listed Below (If Applicable). BE SURE TO FOLLOW UP WITH PRIMARY CARE IN 1-2 WEEKS

## 2018-07-25 NOTE — Progress Notes (Signed)
Cardiology Office Note:    Date:  07/25/2018   ID:  [name], DOB [birth year], MRN [id number]  PCP:  [name], MD  Cardiologist:  [name], MD   Electrophysiologist:  None   Referring MD: [name], MD   Chief Complaint  Patient presents with  . Follow-up    CHF, CAD, AFib     History of Present Illness:    Daniel Cooksey. is a 84 y.o. male with coronary artery disease s/p CABG in 2010, atrial fibrillation, aortic stenosis s/p TAVR, (04/2016), complete heart block s/p CRT-P ([employer] - MRI compatible), combined systolic and diastolic CHF, orthostatic hypotension.  He has had multiple PCI procedures since his coronary artery bypass grafting.  He underwent stenting to the S-OM1 in 2017, DES to the ostial and distal portions of the S-RPDA in 07/2017 and, most recently, POBA to the [facility] in the setting of a NSTEMI ([place], [place]) in 3/19.  His LV function has worsened and his EF is now 20-25%. He has had worsening heart failure symptoms in the setting of new onset atrial fibrillation.  He underwent cardioversion in August 2019 with restoration of normal sinus rhythm and significantly improved heart failure symptoms.  He was feeling much better when he was last seen by Dr. Burt Knack in September 2019.  ICM clinic follow up 07/15/18 demonstrated normal thoracic impedance.      [name] returns for follow up. He is here with his wife.  Since last seen, he has been doing well.  He has not had any chest pain.  He continues to feel better since his cardioversion.  He has days when he feels more short of breath than others.  He denies syncope, paroxysmal nocturnal dyspnea, edema.  He does note recent onset of a lump in his L chest.  It is mildly uncomfortable.    Prior CV studies:   The following studies were reviewed today:  Cardiac catheterization 11/12/2017([facility], [place], [place]) LVEDP 16 EF 50% LM occluded RCA occluded SVG-RCA patent with mid  stent with subtotal ISR SVG-OM patent stent LIMA-LAD patent PCI: POBA to the SVG-RCA  Echo 11/13/2017([facility], [place], [place]) EF 20-25, diffuse HK, mild MR, normally functioning aortic valve prosthesis, mild to moderate TR  Cardiac Catheterization12/20/18 LM ostial 100 RCA proximal 100-CTO SVG-RPDA ostial 90, distal 90 SVG-OM1 stent patent LIMA-LAD patent EF 40-45 Mean PCWP 20; LVEDP 22 PCI: 4 x 12 mm Resolute Onyx DES to the ostial/proximal SVG-RPDA  PCI: 3 x 22 mm Resolute Onyx DES to the distal SVG-RPDA Recommend: Continue clopidogrel without interruption, add aspirin 81 mg daily, diurese overnight with a single dose of IV Lasix and then continue oral furosemide. If no complications arise should be okay for discharge tomorrow.  Carotid US 05/14/17 L CEA patent; RICA 40-59  Echo 05/09/17 Inferior akinesis, mild LVH, EF 40, status post TAVR with no perivalvular regurgitation, MAC, mild MR, moderate LAE, mild RAE, PASP 36  Echo 06/22/16 Mild LVH, EF 40-45, diffuse HK, grade 2 diastolic dysfunction, status post TAVR with no significant stenosis or perivalvular regurgitation, MAC, trivial MR, mild LAE, mild RAE, PASP 39  Past Medical History:  Diagnosis Date  . Arthritis   . Benign prostatic hypertrophy   . CAD (coronary artery disease)    DES, SVG to circumflex marginal, October, 2010 ( SVG to PDA and PLA patent , LIMA to LAD patent, EF 60%, mild inferior hypokinesis 12/18 PCI/DES to SVG-PDA  x2, EF 40% // NSTEMI 3/19 [facility]) >> S-OM and L-LAD ok; S-RCA stent sub-total ISR >> PCI:  POBA  . Chronic systolic CHF (congestive heart failure) ([place])    Echo 9/18: Inferior akinesis, mild LVH, EF 40, status post TAVR with no perivalvular regurgitation, MAC, mild MR, moderate LAE, mild RAE, PASP 36 // Echo 3/19: EF 20-25, mild MR, AVR ok, mild to mod TR // Echo 6/19: Mild LVH, EF 30-35, diffuse HK, inferolateral and inferior AK, status post TAVR, no stenosis, no  AI (mean gradient 5), MAC, mild MR, severe LAE, normal RVSF, moderate TR, PASP 50   . Complete heart block ([place])    a. s/p MDT CRTP 04/2016 Dr [name]  . GERD (gastroesophageal reflux disease)   . History of kidney stones 1970   /notes 01/03/2011  . HTN (hypertension)   . Hyperlipidemia   . Hypothyroidism   . Neuromuscular disorder ([place])   . Peripheral neuropathy   . Persistent atrial fibrillation   . Psoriasis    severe; with total skin exfoliation in the past resolved  . S/P TAVR (transcatheter aortic valve replacement)    severe AS >> s/p TAVR 9/17 // Limited Echo 10/17: EF 40-45%, lat and inf-lat HK, TAVR ok, MAC, mild BAE, PASP 31 mmHg, no effusion  . Ventricular tachycardia, non-sustained (HCC)    Surgical Hx: The patient  has a past surgical history that includes decompression of left median nerve; Colonoscopy (11/23/2011); Tonsillectomy; Appendectomy; Total knee arthroplasty (04/16/2012); Cataract extraction, bilateral; Carpal tunnel release (Bilateral, 2009-2011); left heart catheterization with coronary angiogram (N/A, 12/27/2012); TEE without cardioversion (N/A, 04/21/2016); Cardiac catheterization (N/A, 04/27/2016); Transcatheter aortic valve replacement, transfemoral (N/A, 05/16/2016); TEE without cardioversion (N/A, 05/16/2016); Cardiac catheterization (N/A, 11/17/2015); Cardiac catheterization (N/A, 11/17/2015); Cardiac catheterization (N/A, 04/19/2016); Coronary angioplasty with stent; Coronary angioplasty with stent (08/09/2017); Coronary angioplasty with stent (11/17/2015); Lumbar laminectomy (Bilateral, 09/2004); Carotid endarterectomy (Left, 02/2008); Synovial cyst excision (09/2004); Back surgery; Joint replacement; Lumbar disc surgery; Anterior cervical decomp/discectomy fusion; Kidney stone surgery; Coronary artery bypass graft (1995); RIGHT/LEFT HEART CATH AND CORONARY/GRAFT ANGIOGRAPHY (N/A, 08/09/2017); CORONARY STENT INTERVENTION (N/A, 08/09/2017); and Cardioversion (N/A, 03/27/2018).    Current Medications: Current Meds  Medication Sig  . allopurinol (ZYLOPRIM) 300 MG tablet Take 300 mg by mouth daily.  Marland Kitchen apixaban (ELIQUIS) 5 MG TABS tablet Take 1 tablet (5 mg total) by mouth 2 (two) times daily.  Marland Kitchen atorvastatin (LIPITOR) 20 MG tablet Take 20 mg by mouth every evening.   . docusate sodium (COLACE) 100 MG capsule Take 100 mg by mouth daily as needed for moderate constipation.   . fluticasone (FLONASE) 50 MCG/ACT nasal spray Place 1 spray into both nostrils daily.   Marland Kitchen latanoprost (XALATAN) 0.005 % ophthalmic solution Place 1 drop into both eyes at bedtime.  Marland Kitchen levothyroxine (SYNTHROID, LEVOTHROID) 112 MCG tablet Take 112 mcg by mouth daily before breakfast.  . Magnesium 250 MG TABS Take 250 mg by mouth 2 (two) times daily.   . metoprolol tartrate (LOPRESSOR) 25 MG tablet Take 0.5 tablets (12.5 mg total) by mouth 2 (two) times daily.  . multivitamin (THERAGRAN) per tablet Take 1 tablet by mouth at bedtime.   . nitroGLYCERIN (NITROSTAT) 0.4 MG SL tablet Place 0.4 mg under the tongue every 5 (five) minutes x 3 doses as needed for chest pain.   . potassium chloride SA (KLOR-CON M20) 20 MEQ tablet Take 2 tabs in the AM and 1 tab in the PM  . pyridOXINE (VITAMIN B-6) 100 MG tablet Take  100 mg by mouth daily.   . Tamsulosin HCl (FLOMAX) 0.4 MG CAPS Take 0.4 mg by mouth daily.   Marland Kitchen torsemide (DEMADEX) 20 MG tablet Take 2 tabs in the AM and 1 tab in the PM     Allergies:   Sulfamethoxazole; Other; and Propylene glycol   Social History   Tobacco Use  . Smoking status: Never Smoker  . Smokeless tobacco: Never Used  Substance Use Topics  . Alcohol use: Yes    Alcohol/week: 1.0 standard drinks    Types: 1 Cans of beer per week  . Drug use: No     Family Hx: The patient's family history includes Heart attack in his mother; Heart attack (age of onset: 29) in his son; Heart disease in his father and mother; Hyperlipidemia in his father; Hypertension in his father. There is no  history of Colon cancer.  ROS:   Please see the history of present illness.    Review of Systems  Cardiovascular: Positive for dyspnea on exertion.  Hematologic/Lymphatic: Bruises/bleeds easily.  Neurological: Positive for dizziness and loss of balance.   All other systems reviewed and are negative.   EKGs/Labs/Other Test Reviewed:    EKG:  EKG is not ordered today.    Recent Labs: 08/07/2017: NT-Pro BNP 2,488 05/07/2018: ALT 25; BUN 32; Creatinine, Ser 1.28; Hemoglobin 11.4; Platelets 100; Potassium 3.8; Sodium 140   Recent Lipid Panel Lab Results  Component Value Date/Time   CHOL 143 08/06/2006 08:14 AM   TRIG 141 08/06/2006 08:14 AM   HDL 29.7 (L) 08/06/2006 08:14 AM   CHOLHDL 4.8 CALC 08/06/2006 08:14 AM   LDLCALC 85 08/06/2006 08:14 AM    Physical Exam:    VS:  BP (!) 122/56   Pulse (!) 56   Ht 5' 8"  (1.727 m)   Wt 182 lb 12.8 oz (82.9 kg)   SpO2 96%   BMI 27.79 kg/m     Wt Readings from Last 3 Encounters:  07/25/18 182 lb 12.8 oz (82.9 kg)  05/14/18 181 lb 12.8 oz (82.5 kg)  05/13/18 175 lb 12.8 oz (79.7 kg)     Physical Exam  Constitutional: He is oriented to person, place, and time. He appears well-developed and well-nourished. No distress.  HENT:  Head: Normocephalic and atraumatic.  Eyes: No scleral icterus.  Neck: No JVD present. No thyromegaly present.  Cardiovascular: Normal rate. An irregular rhythm present.  Murmur heard.  Systolic murmur is present with a grade of 2/6 at the lower left sternal border. Pulmonary/Chest: Effort normal. He has no rales. Left breast exhibits mass (apricot sized mass; subareolar; mobile; +/- tenderness; small area of erythema at ~ 3:004-4:00).  Abdominal: Soft. He exhibits no distension.  Musculoskeletal: He exhibits no edema.  Lymphadenopathy:    He has no cervical adenopathy.  Neurological: He is alert and oriented to person, place, and time.  Skin: Skin is warm and dry.  Psychiatric: He has a normal mood and  affect.    ASSESSMENT & PLAN:    Chronic combined systolic and diastolic CHF (congestive heart failure) (HCC) EF 20-25.  NYHA 2.  Volume status stable.  He has not been able to tolerate CHF medications due to low BP and orthostasis.  Continue current dose of beta-blocker, loop diuretic.  Obtain f/u BMET today.   Persistent atrial fibrillation  CHF improved since his cardioversion.  He is tolerating anticoagulation.  Continue current dose of Eliquis.  Obtain f/u BMET, CBC.  Coronary artery disease involving coronary bypass  graft of native heart without angina pectoris Prior CABG and subsequent PCI of the SVG-OM1, DES x2 to the SVG-RCA and balloon angioplasty of the SVG-RCA.  He denies angina.  He is not on ASA as he is on Eliquis.  Continue beta-blocker, statin.  Bilateral carotid artery disease, unspecified type (HCC) Hx of L CEA.  Managed by [facility].  Severe Aortic Stenosis S/P TAVR (transcatheter aortic valve replacement) Normally functioning AVR by last echo.  Continue SBE prophylaxis.    Biventricular cardiac pacemaker in situ Follow up with EP as planned.   Breast mass in male L breast subareolar mass with mild amount of erythema.  This appears concerning.  He needs further evaluation.  We tried to arrange follow up with his PCP today prior to leaving the office.  We could not get through on the phone.  I will set him up for a mammogram at [facility]. I will also get a CBC today (rule out leukocytosis).  I have asked him to make sure he arranges follow up with his PCP in the next 1-2 weeks to follow up on this.   Dispo:  Return in about 3 months (around 10/24/2018) for Routine Follow Up, w/ Dr. Burt Knack.   Medication Adjustments/Labs and Tests Ordered: Current medicines are reviewed at length with the patient today.  Concerns regarding medicines are outlined above.  Tests Ordered: Orders Placed This Encounter  Procedures  . MM DIAG BREAST TOMO BILATERAL  . Basic metabolic panel  .  CBC   Medication Changes: No orders of the defined types were placed in this encounter.   Signed, [name], PA-C  07/25/2018 2:21 PM    [facility] [address], [place], [place]  [address] Phone: [phone]; Fax: [phone]

## 2018-07-25 NOTE — Addendum Note (Signed)
Addended by: Michae Kava on: 07/25/2018 02:47 PM   Modules accepted: Orders

## 2018-07-26 ENCOUNTER — Telehealth: Payer: Self-pay

## 2018-07-26 LAB — BASIC METABOLIC PANEL WITH GFR
BUN/Creatinine Ratio: 21 (ref 10–24)
BUN: 29 mg/dL — ABNORMAL HIGH (ref 8–27)
CO2: 31 mmol/L — ABNORMAL HIGH (ref 20–29)
Calcium: 9.6 mg/dL (ref 8.6–10.2)
Chloride: 99 mmol/L (ref 96–106)
Creatinine, Ser: 1.37 mg/dL — ABNORMAL HIGH (ref 0.76–1.27)
GFR calc Af Amer: 54 mL/min/{1.73_m2} — ABNORMAL LOW
GFR calc non Af Amer: 47 mL/min/{1.73_m2} — ABNORMAL LOW
Glucose: 102 mg/dL — ABNORMAL HIGH (ref 65–99)
Potassium: 4.2 mmol/L (ref 3.5–5.2)
Sodium: 142 mmol/L (ref 134–144)

## 2018-07-26 LAB — CBC
HEMATOCRIT: 31.7 % — AB (ref 37.5–51.0)
Hemoglobin: 10.7 g/dL — ABNORMAL LOW (ref 13.0–17.7)
MCH: 33.3 pg — ABNORMAL HIGH (ref 26.6–33.0)
MCHC: 33.8 g/dL (ref 31.5–35.7)
MCV: 99 fL — AB (ref 79–97)
PLATELETS: 139 10*3/uL — AB (ref 150–450)
RBC: 3.21 x10E6/uL — ABNORMAL LOW (ref 4.14–5.80)
RDW: 13.3 % (ref 12.3–15.4)
WBC: 7 10*3/uL (ref 3.4–10.8)

## 2018-07-26 NOTE — Telephone Encounter (Signed)
-----   Message from Liliane Shi, PA-C sent at 07/26/2018 11:39 AM EST ----- Renal function and hemoglobin are stable.  The potassium is normal.   Recommendations:  - Continue current medications and follow up as planned.  [name], PA-C    07/26/2018 11:38 AM

## 2018-07-26 NOTE — Telephone Encounter (Signed)
Notes recorded by Frederik Schmidt, RN on 07/26/2018 at 11:41 AM EST The patient has been notified of the result and verbalized understanding. All questions (if any) were answered. Frederik Schmidt, RN 07/26/2018 11:41 AM   ------

## 2018-07-31 ENCOUNTER — Inpatient Hospital Stay (HOSPITAL_COMMUNITY): Payer: Medicare HMO | Attending: Hematology

## 2018-07-31 DIAGNOSIS — Z7902 Long term (current) use of antithrombotics/antiplatelets: Secondary | ICD-10-CM | POA: Diagnosis not present

## 2018-07-31 DIAGNOSIS — D7589 Other specified diseases of blood and blood-forming organs: Secondary | ICD-10-CM | POA: Insufficient documentation

## 2018-07-31 DIAGNOSIS — I10 Essential (primary) hypertension: Secondary | ICD-10-CM | POA: Insufficient documentation

## 2018-07-31 DIAGNOSIS — Z79899 Other long term (current) drug therapy: Secondary | ICD-10-CM | POA: Insufficient documentation

## 2018-07-31 DIAGNOSIS — G629 Polyneuropathy, unspecified: Secondary | ICD-10-CM | POA: Diagnosis not present

## 2018-07-31 DIAGNOSIS — I872 Venous insufficiency (chronic) (peripheral): Secondary | ICD-10-CM | POA: Diagnosis not present

## 2018-07-31 DIAGNOSIS — L821 Other seborrheic keratosis: Secondary | ICD-10-CM | POA: Diagnosis not present

## 2018-07-31 DIAGNOSIS — D649 Anemia, unspecified: Secondary | ICD-10-CM | POA: Diagnosis not present

## 2018-07-31 DIAGNOSIS — D485 Neoplasm of uncertain behavior of skin: Secondary | ICD-10-CM | POA: Diagnosis not present

## 2018-07-31 DIAGNOSIS — L570 Actinic keratosis: Secondary | ICD-10-CM | POA: Diagnosis not present

## 2018-07-31 DIAGNOSIS — D0359 Melanoma in situ of other part of trunk: Secondary | ICD-10-CM | POA: Diagnosis not present

## 2018-07-31 DIAGNOSIS — Z85828 Personal history of other malignant neoplasm of skin: Secondary | ICD-10-CM | POA: Diagnosis not present

## 2018-07-31 DIAGNOSIS — I781 Nevus, non-neoplastic: Secondary | ICD-10-CM | POA: Diagnosis not present

## 2018-07-31 LAB — CBC WITH DIFFERENTIAL/PLATELET
Abs Immature Granulocytes: 0.02 10*3/uL (ref 0.00–0.07)
BASOS PCT: 0 %
Basophils Absolute: 0 10*3/uL (ref 0.0–0.1)
EOS PCT: 2 %
Eosinophils Absolute: 0.2 10*3/uL (ref 0.0–0.5)
HEMATOCRIT: 32.4 % — AB (ref 39.0–52.0)
Hemoglobin: 10.6 g/dL — ABNORMAL LOW (ref 13.0–17.0)
Immature Granulocytes: 0 %
LYMPHS PCT: 15 %
Lymphs Abs: 1 10*3/uL (ref 0.7–4.0)
MCH: 33.8 pg (ref 26.0–34.0)
MCHC: 32.7 g/dL (ref 30.0–36.0)
MCV: 103.2 fL — AB (ref 80.0–100.0)
Monocytes Absolute: 0.8 10*3/uL (ref 0.1–1.0)
Monocytes Relative: 11 %
NRBC: 0 % (ref 0.0–0.2)
Neutro Abs: 4.9 10*3/uL (ref 1.7–7.7)
Neutrophils Relative %: 72 %
PLATELETS: 122 10*3/uL — AB (ref 150–400)
RBC: 3.14 MIL/uL — ABNORMAL LOW (ref 4.22–5.81)
RDW: 14.8 % (ref 11.5–15.5)
WBC: 6.9 10*3/uL (ref 4.0–10.5)

## 2018-07-31 LAB — COMPREHENSIVE METABOLIC PANEL
ALBUMIN: 3.9 g/dL (ref 3.5–5.0)
ALT: 24 U/L (ref 0–44)
ANION GAP: 9 (ref 5–15)
AST: 34 U/L (ref 15–41)
Alkaline Phosphatase: 113 U/L (ref 38–126)
BUN: 34 mg/dL — AB (ref 8–23)
CO2: 28 mmol/L (ref 22–32)
CREATININE: 1.34 mg/dL — AB (ref 0.61–1.24)
Calcium: 9.4 mg/dL (ref 8.9–10.3)
Chloride: 101 mmol/L (ref 98–111)
GFR calc non Af Amer: 48 mL/min — ABNORMAL LOW (ref >60)
GFR, EST AFRICAN AMERICAN: 56 mL/min — AB (ref >60)
Glucose, Bld: 107 mg/dL — ABNORMAL HIGH (ref 70–99)
POTASSIUM: 3.5 mmol/L (ref 3.5–5.1)
SODIUM: 138 mmol/L (ref 135–145)
TOTAL PROTEIN: 7.3 g/dL (ref 6.5–8.1)
Total Bilirubin: 0.9 mg/dL (ref 0.3–1.2)

## 2018-07-31 LAB — LACTATE DEHYDROGENASE: LDH: 156 U/L (ref 98–192)

## 2018-07-31 LAB — FERRITIN: Ferritin: 255 ng/mL (ref 24–336)

## 2018-08-05 ENCOUNTER — Other Ambulatory Visit: Payer: Self-pay

## 2018-08-05 ENCOUNTER — Inpatient Hospital Stay (HOSPITAL_BASED_OUTPATIENT_CLINIC_OR_DEPARTMENT_OTHER): Payer: Medicare HMO | Admitting: Internal Medicine

## 2018-08-05 ENCOUNTER — Encounter (HOSPITAL_COMMUNITY): Payer: Self-pay | Admitting: Internal Medicine

## 2018-08-05 ENCOUNTER — Ambulatory Visit (HOSPITAL_COMMUNITY): Payer: Medicare HMO | Admitting: Hematology

## 2018-08-05 VITALS — BP 110/61 | HR 68 | Temp 97.9°F | Resp 16 | Wt 181.7 lb

## 2018-08-05 DIAGNOSIS — Z7902 Long term (current) use of antithrombotics/antiplatelets: Secondary | ICD-10-CM | POA: Diagnosis not present

## 2018-08-05 DIAGNOSIS — G629 Polyneuropathy, unspecified: Secondary | ICD-10-CM

## 2018-08-05 DIAGNOSIS — D7589 Other specified diseases of blood and blood-forming organs: Secondary | ICD-10-CM

## 2018-08-05 DIAGNOSIS — Z79899 Other long term (current) drug therapy: Secondary | ICD-10-CM | POA: Diagnosis not present

## 2018-08-05 DIAGNOSIS — I10 Essential (primary) hypertension: Secondary | ICD-10-CM | POA: Diagnosis not present

## 2018-08-05 DIAGNOSIS — I5042 Chronic combined systolic (congestive) and diastolic (congestive) heart failure: Secondary | ICD-10-CM | POA: Diagnosis not present

## 2018-08-05 DIAGNOSIS — D649 Anemia, unspecified: Secondary | ICD-10-CM

## 2018-08-05 NOTE — Patient Instructions (Signed)
[facility]  Discharge Instructions: You saw Dr. Higgs today                               _______________________________________________________________  Thank you for choosing [facility] to provide your oncology and hematology care.  To afford each patient quality time with our providers, please arrive at least 15 minutes before your scheduled appointment.  You need to re-schedule your appointment if you arrive 10 or more minutes late.  We strive to give you quality time with our providers, and arriving late affects you and other patients whose appointments are after yours.  Also, if you no show three or more times for appointments you may be dismissed from the clinic.  Again, thank you for choosing [facility]. Our hope is that these requests will allow you access to exceptional care and in a timely manner. _______________________________________________________________  If you have questions after your visit, please contact our office at [phone] between the hours of 8:30 a.m. and 5:00 p.m. Voicemails left after 4:30 p.m. will not be returned until the following business day. _______________________________________________________________  For prescription refill requests, have your pharmacy contact our office. _______________________________________________________________  Recommendations made by the consultant and any test results will be sent to your referring physician. _______________________________________________________________ 
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
## 2018-08-07 DIAGNOSIS — H401131 Primary open-angle glaucoma, bilateral, mild stage: Secondary | ICD-10-CM | POA: Diagnosis not present

## 2018-08-08 DIAGNOSIS — N6321 Unspecified lump in the left breast, upper outer quadrant: Secondary | ICD-10-CM | POA: Diagnosis not present

## 2018-08-08 DIAGNOSIS — N62 Hypertrophy of breast: Secondary | ICD-10-CM | POA: Diagnosis not present

## 2018-08-12 DIAGNOSIS — I5022 Chronic systolic (congestive) heart failure: Secondary | ICD-10-CM | POA: Diagnosis not present

## 2018-08-12 DIAGNOSIS — N183 Chronic kidney disease, stage 3 (moderate): Secondary | ICD-10-CM | POA: Diagnosis not present

## 2018-08-15 ENCOUNTER — Ambulatory Visit (INDEPENDENT_AMBULATORY_CARE_PROVIDER_SITE_OTHER): Payer: Medicare HMO

## 2018-08-15 DIAGNOSIS — Z950 Presence of cardiac pacemaker: Secondary | ICD-10-CM | POA: Diagnosis not present

## 2018-08-15 DIAGNOSIS — I5042 Chronic combined systolic (congestive) and diastolic (congestive) heart failure: Secondary | ICD-10-CM

## 2018-08-16 NOTE — Progress Notes (Signed)
EPIC Encounter for ICM Monitoring  Patient Name: [name]. is a 84 y.o. male Date: 08/16/2018 Primary Care Physican: [name], MD Primary Cardiologist:Cooper/Weaver, PA Electrophysiologist:Allred Bi-V Pacing:85.7% Last Weight: 176lbs                                                           Transmission reviewed     Thoracic impedance normal.   Prescribed: Torsemide20 mg take 2 tabs (40 mg total) every morning and 1 tablet (20 mg) every evening. Potassium 20 mEq take 2 tabs (40 mEq total) every morning and 1 tab (20 mEq total) every evening.   Labs: 04/09/2018 Creatinine 1.16, BUN 22, Potassium 4.0, Sodium 142, eGFR 57-66 05/07/2018 Creatinine 1.28, BUN 32, Potassium 3.8, Sodium 140, eGFR 49-57  07/25/2018 Creatinine 1.37, BUN 29, Potassium 4.2, Sodium 142, eGFR 47-54  07/31/2018 Creatinine 1.34, BUN 34, Potassium 3.5, Sodium 138, eGFR 48-56  04/02/2018 Creatinine 1.11, BUN 22, Potassium 3.8, Sodium 141, EGFR 60-70 03/26/2018 Creatinine 1.40, BUN 26, Potassium 3.8, Sodium 142, EGFR 46-53 03/18/2018 Creatinine 1.34, BUN 28, Potassium 4.0, Sodium 141, EGFR 48-56 03/07/2018 Creatinine1.38, BUN28, Potassium4.5, Sodium141, [id number] 02/27/2018 Creatinine1.29, BUN28, Potassium4.1, Sodium140, [id number]  02/12/2018 Creatinine1.37, BUN30, Potassium4.2, Sodium141, [id number]  02/04/2018 Creatinine1.38, BUN28, Potassium4.3, [id number], [id number]  02/01/2018 Creatinine1.34, BUN21, Potassium4.3, [id number], [id number] 12/28/2017 Creatinine 1.24, BUN 30, Potassium 4.5, Sodium 138, EGFR 52-60 A complete set of results can be found in results review.  Recommendations: None  Follow-up plan: ICM clinic phone appointment on 09/16/2018.    Copy of ICM check sent to Dr. [name].   3 month ICM trend: 08/15/2018    1 Year ICM trend:       Rosalene Billings, RN 08/16/2018 8:36 AM

## 2018-09-16 ENCOUNTER — Ambulatory Visit (INDEPENDENT_AMBULATORY_CARE_PROVIDER_SITE_OTHER): Payer: Medicare HMO

## 2018-09-16 DIAGNOSIS — I441 Atrioventricular block, second degree: Secondary | ICD-10-CM

## 2018-09-16 DIAGNOSIS — R55 Syncope and collapse: Secondary | ICD-10-CM | POA: Diagnosis not present

## 2018-09-17 ENCOUNTER — Ambulatory Visit (INDEPENDENT_AMBULATORY_CARE_PROVIDER_SITE_OTHER): Payer: Medicare HMO

## 2018-09-17 DIAGNOSIS — Z950 Presence of cardiac pacemaker: Secondary | ICD-10-CM | POA: Diagnosis not present

## 2018-09-17 DIAGNOSIS — I5042 Chronic combined systolic (congestive) and diastolic (congestive) heart failure: Secondary | ICD-10-CM

## 2018-09-17 LAB — CUP PACEART REMOTE DEVICE CHECK
Battery Remaining Longevity: 74 mo
Battery Voltage: 2.97 V
Brady Statistic AP VP Percent: 61.12 %
Brady Statistic AP VS Percent: 2.56 %
Brady Statistic AS VS Percent: 16.14 %
Brady Statistic RA Percent Paced: 71.19 %
Brady Statistic RV Percent Paced: 81.32 %
Date Time Interrogation Session: 20200127055619
Implantable Lead Implant Date: 20170907
Implantable Lead Implant Date: 20170907
Implantable Lead Implant Date: 20170907
Implantable Lead Location: 753858
Implantable Lead Location: 753859
Implantable Lead Location: 753860
Implantable Lead Model: 4598
Implantable Lead Model: 5076
Implantable Lead Model: 5076
Implantable Pulse Generator Implant Date: 20170907
Lead Channel Impedance Value: 304 Ohm
Lead Channel Impedance Value: 304 Ohm
Lead Channel Impedance Value: 323 Ohm
Lead Channel Impedance Value: 361 Ohm
Lead Channel Impedance Value: 380 Ohm
Lead Channel Impedance Value: 399 Ohm
Lead Channel Impedance Value: 399 Ohm
Lead Channel Impedance Value: 399 Ohm
Lead Channel Impedance Value: 532 Ohm
Lead Channel Impedance Value: 532 Ohm
Lead Channel Impedance Value: 608 Ohm
Lead Channel Impedance Value: 627 Ohm
Lead Channel Impedance Value: 665 Ohm
Lead Channel Pacing Threshold Amplitude: 0.75 V
Lead Channel Pacing Threshold Amplitude: 1.25 V
Lead Channel Pacing Threshold Pulse Width: 0.4 ms
Lead Channel Pacing Threshold Pulse Width: 0.4 ms
Lead Channel Pacing Threshold Pulse Width: 0.8 ms
Lead Channel Sensing Intrinsic Amplitude: 2.625 mV
Lead Channel Sensing Intrinsic Amplitude: 8.875 mV
Lead Channel Sensing Intrinsic Amplitude: 8.875 mV
Lead Channel Setting Pacing Amplitude: 2 V
Lead Channel Setting Pacing Amplitude: 2.25 V
Lead Channel Setting Pacing Amplitude: 2.5 V
Lead Channel Setting Pacing Pulse Width: 0.4 ms
Lead Channel Setting Pacing Pulse Width: 0.8 ms
Lead Channel Setting Sensing Sensitivity: 0.9 mV
MDC IDC MSMT LEADCHNL RA IMPEDANCE VALUE: 285 Ohm
MDC IDC MSMT LEADCHNL RA SENSING INTR AMPL: 2.625 mV
MDC IDC MSMT LEADCHNL RV PACING THRESHOLD AMPLITUDE: 1 V
MDC IDC STAT BRADY AS VP PERCENT: 20.18 %

## 2018-09-17 NOTE — Progress Notes (Signed)
Remote pacemaker transmission.   

## 2018-09-19 ENCOUNTER — Telehealth: Payer: Self-pay

## 2018-09-19 NOTE — Progress Notes (Signed)
EPIC Encounter for ICM Monitoring  Patient Name: [name]. is a 85 y.o. male Date: 09/19/2018 Primary Care Physican: [name], MD Primary Cardiologist:Cooper/Weaver, PA Electrophysiologist:Allred Bi-V Pacing:81.3% Last Weight:176lbs   Attempted call to patient and unable to reach.  Transmission reviewed.   Thoracic impedance abnormal since 09/02/2018 suggesting fluid accumulation but trending toward baseline.   Prescribed:Torsemide20 mg take 2 tabs (40 mg total) every morning and 1 tablet (20 mg) every evening. Potassium 20 mEq take 2 tabs (40 mEq total) every morning and 1 tab (20 mEq total) every evening.   Labs: 04/09/2018 Creatinine 1.16, BUN 22, Potassium 4.0, Sodium 142, eGFR 57-66 05/07/2018 Creatinine 1.28, BUN 32, Potassium 3.8, Sodium 140, eGFR 49-57  07/25/2018 Creatinine 1.37, BUN 29, Potassium 4.2, Sodium 142, eGFR 47-54  07/31/2018 Creatinine 1.34, BUN 34, Potassium 3.5, Sodium 138, eGFR 48-56  04/02/2018 Creatinine 1.11, BUN 22, Potassium 3.8, Sodium 141, EGFR 60-70 03/26/2018 Creatinine 1.40, BUN 26, Potassium 3.8, Sodium 142, EGFR 46-53 03/18/2018 Creatinine 1.34, BUN 28, Potassium 4.0, Sodium 141, EGFR 48-56 03/07/2018 Creatinine1.38, BUN28, Potassium4.5, Sodium141, [id number] 02/27/2018 Creatinine1.29, BUN28, Potassium4.1, Sodium140, [id number]  02/12/2018 Creatinine1.37, BUN30, Potassium4.2, Sodium141, [id number]  02/04/2018 Creatinine1.38, BUN28, Potassium4.3, [id number], [id number]  02/01/2018 Creatinine1.34, BUN21, Potassium4.3, [id number], [id number] 12/28/2017 Creatinine 1.24, BUN 30, Potassium 4.5, Sodium 138, EGFR 52-60 A complete set of results can be found in results review.  Recommendations:Unable to reach.  Follow-up plan: ICM clinic phone appointment on2/05/2019 to recheck fluid levels.   Copy of ICM check sent to Dr.Allred and Dr Burt Knack.   3 month ICM trend: 09/19/2018    1 Year ICM  trend:       Rosalene Billings, RN 09/19/2018 1:46 PM

## 2018-09-19 NOTE — Telephone Encounter (Signed)
Remote ICM transmission received.  Attempted call to patient regarding ICM remote transmission and unable to leave a message.

## 2018-09-20 NOTE — Progress Notes (Addendum)
Patient returned call.  He said weight is stable at 177.4 lbs.  He has some shortness of breath but not all the time.  He said he will cut back on salt and work on getting resolving the fluid accumulation.  Advised if condition worsens to call back.

## 2018-09-24 DIAGNOSIS — D0359 Melanoma in situ of other part of trunk: Secondary | ICD-10-CM | POA: Diagnosis not present

## 2018-09-27 DIAGNOSIS — C439 Malignant melanoma of skin, unspecified: Secondary | ICD-10-CM | POA: Diagnosis not present

## 2018-09-30 ENCOUNTER — Ambulatory Visit (INDEPENDENT_AMBULATORY_CARE_PROVIDER_SITE_OTHER): Payer: Medicare HMO

## 2018-09-30 DIAGNOSIS — Z950 Presence of cardiac pacemaker: Secondary | ICD-10-CM

## 2018-09-30 DIAGNOSIS — I5042 Chronic combined systolic (congestive) and diastolic (congestive) heart failure: Secondary | ICD-10-CM

## 2018-09-30 NOTE — Progress Notes (Signed)
Spoke with patient. Advised [name], [place] recommended he increase Torsemide to 40 mg twice daily x 3 days, then resume 40 mg in A and 20 mg in P.  Also recommended to increase K+ to 40 mEq Twice daily x 3 days, then resume 40 mEq in A and 20 mEq in P.  Will have BMET drawn on 10/07/2018 at [facility] on [place] and order faxed to that facility.   Recheck impedance on 10/04/2018.  He verbalized understanding of all recommendations and repeated back correctly twice.

## 2018-09-30 NOTE — Addendum Note (Signed)
Addended by: Rosalene Billings on: 09/30/2018 03:55 PM   Modules accepted: Orders

## 2018-09-30 NOTE — Progress Notes (Signed)
EPIC Encounter for ICM Monitoring  Patient Name: [name]. is a 85 y.o. male Date: 09/30/2018 Primary Care Physican: [name], MD Primary Cardiologist:Cooper/Weaver, PA Electrophysiologist:Allred Bi-V Pacing:85.3% Last Weight:177.4lbs Today's weight: 177 lbs   Heart failure questions reviewed.  He reports he feels good. He said he has not been as strict with diet in last week. He had a melanoma removed from his chest 2/4.  He has eaten restaurant foods more than usual in the past week.    Thoracic impedance abnormal since 09/22/2018 suggesting fluid accumulation.  Uncommon for impedance to be so far below baseline.    Prescribed:Torsemide20 mg take 2 tabs (40 mg total) every morning and 1 tablet (20 mg) every evening. Potassium 20 mEq take 2 tabs (40 mEq total) every morning and 1 tab (20 mEq total) every evening.   Labs: 07/31/2018 Creatinine 1.34, BUN 34, Potassium 3.5, Sodium 138, GFR 48-56  07/25/2018 Creatinine 1.37, BUN 29, Potassium 4.2, Sodium 140, GFR 47-54  05/07/2018 Creatinine 1.28, BUN 32, Potassium 3.8, Sodium 140, GFR 49-57  04/09/2018 Creatinine1.16, BUN22, Potassium4.0, Sodium142, [id number] 05/07/2018 Creatinine1.28, BUN32, Potassium3.8, Sodium140, [id number]  07/25/2018 Creatinine1.37, BUN29, Potassium4.2, [id number], [id number]  12/11/2019Creatinine 1.34, BUN34, Potassium3.5, Sodium138, [id number] 04/02/2018 Creatinine 1.11, BUN 22, Potassium 3.8, Sodium 141, GFR 60-70 03/26/2018 Creatinine 1.40, BUN 26, Potassium 3.8, Sodium 142, GFR 46-53 A complete set of results can be found in results review.  Recommendations: Discussed limiting salt to 2000 mg and advised to read labels for salt content.  He said he will get back to eating better.  Advised to call for fluid symptoms.   Follow-up plan: ICM clinic phone appointment on2/14/2020 to recheck fluid levels.   Copy of ICM check sent to Dr.Allred, Dr Burt Knack and [name], PA for review and will call back with any recommendations.  3 month ICM trend: 09/30/2018    1 Year ICM trend:       Rosalene Billings, RN 09/30/2018 11:45 AM

## 2018-09-30 NOTE — Progress Notes (Signed)
Increase Torsemide to 40 mg Twice daily x 3 days, then resume 40 mg in A and 20 mg in P. Increase K+ to 40 mEq Twice daily x 3 days, then resume 40 mEq in A and 20 mEq in P. BMET 1 week. Repeat impedance check in 1 week. [name], PA-C    09/30/2018 2:22 PM

## 2018-10-04 ENCOUNTER — Telehealth: Payer: Self-pay

## 2018-10-04 ENCOUNTER — Ambulatory Visit (INDEPENDENT_AMBULATORY_CARE_PROVIDER_SITE_OTHER): Payer: Medicare HMO

## 2018-10-04 DIAGNOSIS — Z950 Presence of cardiac pacemaker: Secondary | ICD-10-CM

## 2018-10-04 DIAGNOSIS — I5042 Chronic combined systolic (congestive) and diastolic (congestive) heart failure: Secondary | ICD-10-CM

## 2018-10-04 NOTE — Progress Notes (Signed)
EPIC Encounter for ICM Monitoring  Patient Name: [name]. is a 85 y.o. male Date: 10/04/2018 Primary Cardiologist:Cooper/Weaver, PA Electrophysiologist:Allred Bi-V Pacing:90.4% Last Weight:177lbs Today's weight: 174 lbs   Heart failure questions reviewed.  He reports he feels better since losing 3 lbs after taking Torsemide 40 mg bid. Weight loss of 3lbs.  He has returned to his prior dosage of 40 AM and 20 every PM.      Thoracic impedanceis trending toward baseline. Impedance has improved after taking Torsemide 40 mg bid for the past 3 days but remains abnormal.    Prescribed:Torsemide20 mg take 2 tabs (40 mg total) every morning and 1 tablet (20 mg) every evening. Potassium 20 mEq take 2 tabs (40 mEq total) every morning and 1 tab (20 mEq total) every evening.   Labs: BMET scheduled for 10/07/2018 07/31/2018 Creatinine 1.34, BUN 34, Potassium 3.5, Sodium 138, GFR 48-56  07/25/2018 Creatinine 1.37, BUN 29, Potassium 4.2, Sodium 140, GFR 47-54  05/07/2018 Creatinine 1.28, BUN 32, Potassium 3.8, Sodium 140, GFR 49-57  04/09/2018 Creatinine1.16, BUN22, Potassium4.0, Sodium142, GFR57-66 05/07/2018 Creatinine1.28, BUN32, Potassium3.8, Sodium140, OPF29-24  07/25/2018 Creatinine1.37, BUN29, Potassium4.2, [id number], RNH65-79  12/11/2019Creatinine 1.34, BUN34, Potassium3.5, Sodium138, UXY33-38 04/02/2018 Creatinine 1.11, BUN 22, Potassium 3.8, Sodium 141, GFR 60-70 03/26/2018 Creatinine 1.40, BUN 26, Potassium 3.8, Sodium 142, GFR 46-53 A complete set of results can be found in results review.  Recommendations: Advised will send copy to [name], [place] for any further recommendations.  BMET scheduled for 10/07/2018.  He said he continues to focus on limiting salt.    Follow-up plan: ICM clinic phone appointment on2/18/2020to recheck fluid levels.   Copy of ICM check sent to Dr.Allred, Dr Burt Knack and [name], PA and will call back  with any recommendations.  3 month ICM trend: 10/04/2018    1 Year ICM trend:       Rosalene Billings, RN 10/04/2018 10:08 AM

## 2018-10-04 NOTE — Progress Notes (Signed)
Increase Torsemide to 40 mg Twice daily x 3 more days, then resume 40 mg in A and 20 mg in P. Increase K+ to 40 mEq Twice daily x 3 more days, then resume 40 mEq in A and 20 mEq in P. Repeat thoracic impedance 1 week. Thanks, [name], PA-C    10/04/2018 2:29 PM

## 2018-10-04 NOTE — Telephone Encounter (Signed)
Remote ICM transmission received.  Attempted call to patient regarding ICM remote transmission and left message, per DPR, to return call.    

## 2018-10-04 NOTE — Progress Notes (Signed)
Spoke with patient.  Advised [name], PA recommended to Increase Torsemide to 40 mgtwice dailyx 3 more days, then resume 40 mg in A and 20 mg in P. Also Increase K+ to 40 mEqtwice dailyx 3 more days, then resume 40 mEq in A and 20 mEq in PM.  Will repeat remote transmission 10/08/2018.

## 2018-10-07 ENCOUNTER — Other Ambulatory Visit: Payer: Self-pay | Admitting: Physician Assistant

## 2018-10-07 DIAGNOSIS — I509 Heart failure, unspecified: Secondary | ICD-10-CM | POA: Diagnosis not present

## 2018-10-08 ENCOUNTER — Ambulatory Visit (INDEPENDENT_AMBULATORY_CARE_PROVIDER_SITE_OTHER): Payer: Medicare HMO

## 2018-10-08 DIAGNOSIS — Z4802 Encounter for removal of sutures: Secondary | ICD-10-CM | POA: Diagnosis not present

## 2018-10-08 DIAGNOSIS — I5042 Chronic combined systolic (congestive) and diastolic (congestive) heart failure: Secondary | ICD-10-CM

## 2018-10-08 DIAGNOSIS — Z950 Presence of cardiac pacemaker: Secondary | ICD-10-CM

## 2018-10-08 LAB — BASIC METABOLIC PANEL
BUN/Creatinine Ratio: 20 (ref 10–24)
BUN: 30 mg/dL — ABNORMAL HIGH (ref 8–27)
CO2: 27 mmol/L (ref 20–29)
Calcium: 9.5 mg/dL (ref 8.6–10.2)
Chloride: 97 mmol/L (ref 96–106)
Creatinine, Ser: 1.49 mg/dL — ABNORMAL HIGH (ref 0.76–1.27)
GFR calc Af Amer: 49 mL/min/{1.73_m2} — ABNORMAL LOW (ref >59)
GFR calc non Af Amer: 42 mL/min/{1.73_m2} — ABNORMAL LOW (ref >59)
Glucose: 90 mg/dL (ref 65–99)
Potassium: 4.1 mmol/L (ref 3.5–5.2)
Sodium: 141 mmol/L (ref 134–144)

## 2018-10-09 ENCOUNTER — Telehealth: Payer: Self-pay

## 2018-10-09 NOTE — Telephone Encounter (Signed)
Spoke with patient to remind of missed remote transmission 

## 2018-10-09 NOTE — Progress Notes (Signed)
Continue current Rx and repeat ICM check in 1 week. Thank you! [name], PA-C    10/09/2018 3:09 PM

## 2018-10-09 NOTE — Progress Notes (Signed)
EPIC Encounter for ICM Monitoring  Patient Name: [name]. is a 85 y.o. male Date: 10/09/2018 Primary Care Physican: [name], MD Primary Cardiologist:Cooper/Weaver, PA Electrophysiologist:Allred Bi-V Pacing:90.4% 09/30/2018 Weight:177lbs 10/04/2018 Weight: 174 lbs 10/09/2018 Weight: 174 lbs   Heart failure questions reviewed. Pt reports he is feeling fine and denies any fluid symptoms.    Thoracic impedanceimproved and continues to trend toward baseline after taking Torsemide 40 mg bid x 3 days 2/10-2/13 and another 3 days 2/14-2/16 but remains abnormal.   Prescribed:Torsemide20 mg take 2 tabs (40 mg total) every morning and 1 tablet (20 mg) every evening. Potassium 20 mEq take 2 tabs (40 mEq total) every morning and 1 tab (20 mEq total) every evening.   Labs:  10/07/2018 Creatinine 1.49, BUN 30, Potassium 4.4, Sodium 141, GFR 42-49 (results faxed today, 10/09/2018, from [facility]) 07/31/2018 Creatinine1.34, BUN34, Potassium3.5, Sodium138, [id number]  07/25/2018 Creatinine1.37, BUN29, Potassium4.2, Sodium140, [id number]  05/07/2018 Creatinine1.28, BUN32, Potassium3.8, Sodium140, SEG31-51 04/09/2018 Creatinine1.16, BUN22, Potassium4.0, [id number], GGY69-48 05/07/2018 Creatinine1.28, BUN32, Potassium3.8, Sodium140, NIO27-03  07/25/2018 Creatinine1.37, BUN29, Potassium4.2, [id number], XHB71-69  12/11/2019Creatinine 1.34, BUN34, Potassium3.5, CVELFY101, BPZ02-58 04/02/2018 Creatinine 1.11, BUN 22, Potassium 3.8, Sodium 141, GFR 60-70 03/26/2018 Creatinine 1.40, BUN 26, Potassium 3.8, Sodium 142, GFR 46-53 A complete set of results can be found in results review.  Recommendations: Advised will send copy to [name], [place] for any further recommendations.  He stated he continues to try lower salt intake.      Follow-up plan: ICM clinic phone appointment on2/24/2020to recheck fluid levels.  Office visit with [name], PA  on 12/02/2018 and Dr [name] on  12/06/2018.  Copy of ICM check sent to Dr.Allred,Dr Burt Knack and [name], PA and will call back with any recommendations.  3 month ICM trend: 10/09/2018     1 Year ICM trend:       Rosalene Billings, RN 10/09/2018 12:15 PM

## 2018-10-09 NOTE — Telephone Encounter (Signed)
Call to [facility] customer service requesting 10/07/2018 BMET results to be faxed to [phone] and she representative said she would fax them.

## 2018-10-11 ENCOUNTER — Telehealth: Payer: Self-pay

## 2018-10-11 DIAGNOSIS — R55 Syncope and collapse: Secondary | ICD-10-CM

## 2018-10-11 DIAGNOSIS — I4819 Other persistent atrial fibrillation: Secondary | ICD-10-CM

## 2018-10-11 DIAGNOSIS — I5042 Chronic combined systolic (congestive) and diastolic (congestive) heart failure: Secondary | ICD-10-CM

## 2018-10-11 DIAGNOSIS — I25810 Atherosclerosis of coronary artery bypass graft(s) without angina pectoris: Secondary | ICD-10-CM

## 2018-10-11 NOTE — Telephone Encounter (Signed)
Reviewed results with pt who verbalized understanding. Per [name], PA-C to have pt obtain a repeat BMET in 1 week. Pt is agreeable for lab appointment scheduled for 2/27.  Pt thanked me for the call. Orders have been placed in Epic.

## 2018-10-14 ENCOUNTER — Ambulatory Visit (INDEPENDENT_AMBULATORY_CARE_PROVIDER_SITE_OTHER): Payer: Medicare HMO

## 2018-10-14 DIAGNOSIS — Z950 Presence of cardiac pacemaker: Secondary | ICD-10-CM

## 2018-10-14 DIAGNOSIS — I5042 Chronic combined systolic (congestive) and diastolic (congestive) heart failure: Secondary | ICD-10-CM

## 2018-10-14 NOTE — Progress Notes (Signed)
EPIC Encounter for ICM Monitoring  Patient Name: Wilberto Console. is a 85 y.o. male Date: 10/14/2018 Primary Care Physican: [name], MD Primary Cardiologist:Cooper/Weaver, PA Electrophysiologist:Allred Bi-V Pacing:84.3%(effective 78.3%) 10/09/2018 Weight: 174 lbs 10/14/2018 Weight 171 lbs  Heart failure questions reviewed. Pt reports he is feeling fine and denies any fluid symptoms. Weight has decreased since last week.  He has been reviewing labels for salt amount and cut out foods that had higher amounts.  He asked about using No Salt product and advised to use very sparingly because it is high in potassium.    Thoracic impedancecontinues to improve and trending to baseline after taking Torsemide 40 mg bid x 3 days 2/10-2/13 and another 3 days 2/14-2/16 but remainsbelow baseline.  Prescribed:Torsemide20 mg take 2 tabs (40 mg total) every morning and 1 tablet (20 mg) every evening. Potassium 20 mEq take 2 tabs (40 mEq total) every morning and 1 tab (20 mEq total) every evening.   Labs: 10/07/2018 Creatinine 1.49, BUN 30, Potassium 4.4, Sodium 141, GFR 42-49 (results faxed today, 10/09/2018, from [facility]) 07/31/2018 Creatinine1.34, BUN34, Potassium3.5, Sodium138, [id number]  07/25/2018 Creatinine1.37, BUN29, Potassium4.2, Sodium140, VFI43-32  05/07/2018 Creatinine1.28, BUN32, Potassium3.8, Sodium140, RJJ88-41 04/09/2018 Creatinine1.16, BUN22, Potassium4.0, YSAYTK160, FUX32-35 05/07/2018 Creatinine1.28, BUN32, Potassium3.8, Sodium140, TDD22-02  07/25/2018 Creatinine1.37, BUN29, Potassium4.2, RKYHCW237, SEG31-51  12/11/2019Creatinine 1.34, BUN34, Potassium3.5, VOHYWV371, GGY69-48 04/02/2018 Creatinine 1.11, BUN 22, Potassium 3.8, Sodium 141, GFR 60-70 03/26/2018 Creatinine 1.40, BUN 26, Potassium 3.8, Sodium 142, GFR 46-53 A complete set of results can be found in results review.  Recommendations: Confirmed he has returned to his  prescribed Torsemide dosage.  Advised will send copy to [name], PA for any further recommendations.  He stated he continues to try lower salt intake.    Follow-up plan: ICM clinic phone appointment on3/10/2018.  Office visit with [name], PA on 12/02/2018 and Dr [name] on  12/06/2018.  Copy of ICM check sent to Dr.Allred,Dr Burt Knack and [name], PA and will call back with any recommendations.  3 month ICM trend: 10/14/2018    1 Year ICM trend:       Rosalene Billings, RN 10/14/2018 10:20 AM

## 2018-10-16 NOTE — Progress Notes (Signed)
Ok.  No change in Rx.  Repeat ICM check again in 2 weeks. [name], PA-C    10/16/2018 5:26 PM

## 2018-10-17 ENCOUNTER — Other Ambulatory Visit: Payer: Medicare HMO | Admitting: *Deleted

## 2018-10-17 DIAGNOSIS — I4819 Other persistent atrial fibrillation: Secondary | ICD-10-CM | POA: Diagnosis not present

## 2018-10-17 DIAGNOSIS — I25810 Atherosclerosis of coronary artery bypass graft(s) without angina pectoris: Secondary | ICD-10-CM | POA: Diagnosis not present

## 2018-10-17 DIAGNOSIS — I5042 Chronic combined systolic (congestive) and diastolic (congestive) heart failure: Secondary | ICD-10-CM

## 2018-10-17 DIAGNOSIS — R55 Syncope and collapse: Secondary | ICD-10-CM | POA: Diagnosis not present

## 2018-10-17 LAB — BASIC METABOLIC PANEL
BUN/Creatinine Ratio: 21 (ref 10–24)
BUN: 31 mg/dL — AB (ref 8–27)
CO2: 27 mmol/L (ref 20–29)
Calcium: 9.7 mg/dL (ref 8.6–10.2)
Chloride: 95 mmol/L — ABNORMAL LOW (ref 96–106)
Creatinine, Ser: 1.48 mg/dL — ABNORMAL HIGH (ref 0.76–1.27)
GFR calc Af Amer: 49 mL/min/{1.73_m2} — ABNORMAL LOW (ref >59)
GFR calc non Af Amer: 43 mL/min/{1.73_m2} — ABNORMAL LOW (ref >59)
Glucose: 95 mg/dL (ref 65–99)
Potassium: 4.3 mmol/L (ref 3.5–5.2)
Sodium: 140 mmol/L (ref 134–144)

## 2018-10-21 ENCOUNTER — Ambulatory Visit (INDEPENDENT_AMBULATORY_CARE_PROVIDER_SITE_OTHER): Payer: Medicare HMO

## 2018-10-21 DIAGNOSIS — I5042 Chronic combined systolic (congestive) and diastolic (congestive) heart failure: Secondary | ICD-10-CM

## 2018-10-21 DIAGNOSIS — Z950 Presence of cardiac pacemaker: Secondary | ICD-10-CM

## 2018-10-22 NOTE — Progress Notes (Signed)
Perfect. Thanks Margarita Grizzle. [name], PA-C    10/22/2018 4:51 PM

## 2018-10-22 NOTE — Progress Notes (Signed)
EPIC Encounter for ICM Monitoring  Patient Name: [name]. is a 85 y.o. male Date: 10/22/2018 Primary Care Physican: [name], MD Primary Cardiologist:Cooper/Weaver, PA Electrophysiologist:Allred Bi-V Pacing:84.3%(effective 78.3%) 10/09/2018 Weight:174 lbs 10/14/2018 Weight 171 lbs 11/18/2018 Weight: 170 lbs  Heart failure questions reviewed. Pt reportshe is feeling better and denied any fluid symptoms.He is currently in [place] until 1st of April.      Thoracic impedancehas returned to baseline normal since taking increased Torsemide 40 mg bid x 3 days 2/10-2/13 and another 3 days 2/14-2/16.  Prescribed:Torsemide20 mg take 2 tabs (40 mg total) every morning and 1 tablet (20 mg) every evening. Potassium 20 mEq take 2 tabs (40 mEq total) every morning and 1 tab (20 mEq total) every evening.   Labs: 10/17/2018 Creatinine 1.48, BUN 31, Potassium 4.3, Sodium 140, GFR 43-49 10/07/2018 Creatinine 1.49, BUN 30, Potassium 4.4, Sodium 141, GFR 42-49 (results faxed today, 10/09/2018, from [facility]) 07/31/2018 Creatinine1.34, BUN34, Potassium3.5, Sodium138, LPF79-02  07/25/2018 Creatinine1.37, BUN29, Potassium4.2, Sodium140, IOX73-53  05/07/2018 Creatinine1.28, BUN32, Potassium3.8, Sodium140, GDJ24-26 04/09/2018 Creatinine1.16, BUN22, Potassium4.0, Sodium142, [id number] 05/07/2018 Creatinine1.28, BUN32, Potassium3.8, Sodium140, IWL79-89  07/25/2018 Creatinine1.37, BUN29, Potassium4.2, QJJHER740, CXK48-18  12/11/2019Creatinine 1.34, BUN34, Potassium3.5, HUDJSH702, OVZ85-88 04/02/2018 Creatinine 1.11, BUN 22, Potassium 3.8, Sodium 141, GFR 60-70 03/26/2018 Creatinine 1.40, BUN 26, Potassium 3.8, Sodium 142, GFR 46-53 A complete set of results can be found in results review.  Recommendations: Continue to limit salt intake.  Patient has monitor with him in [place].   Follow-up plan: ICM clinic phone appointment on3/16/2020.Office  visit with [name], PA on 12/02/2018 and Dr [name] on 12/06/2018.  Copy of ICM check sent to Dr.Allred and [name], PA.  3 month ICM trend: 10/20/2018    1 Year ICM trend:       Rosalene Billings, RN 10/22/2018 4:03 PM

## 2018-11-04 ENCOUNTER — Telehealth: Payer: Self-pay | Admitting: *Deleted

## 2018-11-04 ENCOUNTER — Ambulatory Visit (INDEPENDENT_AMBULATORY_CARE_PROVIDER_SITE_OTHER): Payer: Medicare HMO

## 2018-11-04 DIAGNOSIS — Z950 Presence of cardiac pacemaker: Secondary | ICD-10-CM

## 2018-11-04 DIAGNOSIS — I5042 Chronic combined systolic (congestive) and diastolic (congestive) heart failure: Secondary | ICD-10-CM

## 2018-11-04 NOTE — Telephone Encounter (Signed)
Transmissions received from patient's PPM monitor showing persistent AF/A-flutter since 10/31/18. Pt's most recent DCCV was on 03/27/18. OptiVol is at baseline, improved since taking extra doses of torsemide in 09/2018 (per 10/14/18 ICM encounter).  Spoke with patient. He reports sensing some flutters a few times, but did not realize he is out of rhythm. Compliant with cardiac meds, including apixaban, no missed doses. Reports he has been doing well since getting the fluid off in Feb, reports he is able to do everything he wants to do. He is in [place] through at least 3/22. Originally supposed to stay until 4/1, but may come back early due to COVID-19. Encouraged him to contact our office if he notices HF symptoms prior to his next f/u.  Pt has f/u with [name], PA, on 12/02/18 and with Dr. [name] on 12/06/18. Advised I will send this message to Dr. [name] and call him back with any recommendations. Pt is agreeable to this plan and thanked me for my call.

## 2018-11-05 NOTE — Progress Notes (Signed)
EPIC Encounter for ICM Monitoring  Patient Name: [name]. is a 85 y.o. male Date: 11/05/2018 Primary Care Physican: [name], MD Primary Cardiologist:Cooper/Weaver, PA Electrophysiologist:Allred Bi-V Pacing:86.8%(effective 85.3%) 10/09/2018 Weight:174 lbs 10/14/2018 Weight 171 lbs 10/22/2018 Weight: 170 lbs  Clinical Status (31-Oct-2018 to 04-Nov-2018)  AT/AF 62 episodes   Time in AT/AF 24.0 hr/day (99.9%)  Observations (3) (31-Oct-2018 to 04-Nov-2018)  AT/AF >= 6 hr for 4 days.  V. Pacing less than 90%.  CRT Pacing is effective less than 90% of the time.     Heart failure questions reviewed. Pt reportshe is feeling much better since he no longer has fluid accumulation.  [facility] RN contacted patient 3/16 regarding AF/Aflutter since 10/31/2018. Plans to discuss with Dr [name] for recommendations if needed.    Prescribed:Torsemide20 mg take 2 tabs (40 mg total) every morning and 1 tablet (20 mg) every evening. Potassium 20 mEq take 2 tabs (40 mEq total) every morning and 1 tab (20 mEq total) every evening.   Labs: 10/17/2018 Creatinine 1.48, BUN 31, Potassium 4.3, Sodium 140, GFR 43-49 10/07/2018 Creatinine 1.49, BUN 30, Potassium 4.4, Sodium 141, GFR 42-49 (results faxed today, 10/09/2018, from [facility]) 07/31/2018 Creatinine1.34, BUN34, Potassium3.5, Sodium138, [id number]  07/25/2018 Creatinine1.37, BUN29, Potassium4.2, Sodium140, OIN86-76  05/07/2018 Creatinine1.28, BUN32, Potassium3.8, Sodium140, HMC94-70 04/09/2018 Creatinine1.16, BUN22, Potassium4.0, Sodium142, JGG83-66 05/07/2018 Creatinine1.28, BUN32, Potassium3.8, Sodium140, QHU76-54  07/25/2018 Creatinine1.37, BUN29, Potassium4.2, YTKPTW656, CLE75-17  12/11/2019Creatinine 1.34, BUN34, Potassium3.5, GYFVCB449, QPR91-63 04/02/2018 Creatinine 1.11, BUN 22, Potassium 3.8, Sodium 141, GFR 60-70 03/26/2018  Creatinine 1.40, BUN 26, Potassium 3.8, Sodium 142, GFR 46-53 A complete set of results can be found in results review.  Recommendations: Patient returning from [place] later this week.    Follow-up plan: ICM clinic phone appointment on4/05/2019.Office visit with [name], PA on 12/02/2018 and Dr [name] on 12/06/2018.  Copy of ICM check sent to Dr.Allred and [name], PA.  AT/AF   3 month ICM trend: 11/04/2018    1 Year ICM trend:       Rosalene Billings, RN 11/05/2018 4:28 PM

## 2018-11-06 NOTE — Progress Notes (Signed)
Volume status stable.  No change in diuretic Rx.   Defer AFib management/rhythm control to Dr. [name].   [name], PA-C    11/06/2018 12:49 PM

## 2018-11-07 NOTE — Telephone Encounter (Signed)
Spoke with patient, advised I do not yet have recommendations from Dr. [name], will plan to update pt again next week. Pt reports he is doing fine right now. He is coming back to [place] on Sunday. He denies questions or concerns at this time and thanked me for my call.

## 2018-11-11 ENCOUNTER — Telehealth: Payer: Self-pay | Admitting: Internal Medicine

## 2018-11-11 NOTE — Telephone Encounter (Addendum)
Presenting rhythm as of manual transmission received on 11/11/18 at 0712:   Plan per Dr [name]:  "Lets see if he is still in AF in a week. If so, check to see if symptomatic"  LMOVM requesting call back to [place]. Will update pt regarding plan. Gave direct number for return call.

## 2018-11-11 NOTE — Telephone Encounter (Signed)
See phone note opened 11/04/18.

## 2018-11-11 NOTE — Telephone Encounter (Signed)
New Message   Patient returning [place] phone call.

## 2018-11-11 NOTE — Telephone Encounter (Signed)
Spoke with patient. He is agreeable to plan. Automatic transmission scheduled for 11/18/18 to reassess rhythm. Pt is aware we will call him with an update at that time. Pt agrees to call in the interim with questions or concerns.

## 2018-11-13 ENCOUNTER — Other Ambulatory Visit: Payer: Self-pay | Admitting: Physician Assistant

## 2018-11-15 ENCOUNTER — Encounter (INDEPENDENT_AMBULATORY_CARE_PROVIDER_SITE_OTHER): Payer: Self-pay | Admitting: *Deleted

## 2018-11-18 ENCOUNTER — Other Ambulatory Visit: Payer: Self-pay

## 2018-11-18 ENCOUNTER — Ambulatory Visit (INDEPENDENT_AMBULATORY_CARE_PROVIDER_SITE_OTHER): Payer: Medicare HMO | Admitting: *Deleted

## 2018-11-18 DIAGNOSIS — R55 Syncope and collapse: Secondary | ICD-10-CM

## 2018-11-18 DIAGNOSIS — I441 Atrioventricular block, second degree: Secondary | ICD-10-CM

## 2018-11-18 LAB — CUP PACEART REMOTE DEVICE CHECK
Battery Remaining Longevity: 72 mo
Battery Voltage: 2.96 V
Brady Statistic RA Percent Paced: 0.04 %
Brady Statistic RV Percent Paced: 84.93 %
Date Time Interrogation Session: 20200330113518
Implantable Lead Implant Date: 20170907
Implantable Lead Implant Date: 20170907
Implantable Lead Implant Date: 20170907
Implantable Lead Location: 753858
Implantable Lead Location: 753859
Implantable Lead Location: 753860
Implantable Lead Model: 4598
Implantable Lead Model: 5076
Implantable Lead Model: 5076
Implantable Pulse Generator Implant Date: 20170907
Lead Channel Impedance Value: 285 Ohm
Lead Channel Impedance Value: 304 Ohm
Lead Channel Impedance Value: 304 Ohm
Lead Channel Impedance Value: 342 Ohm
Lead Channel Impedance Value: 342 Ohm
Lead Channel Impedance Value: 380 Ohm
Lead Channel Impedance Value: 399 Ohm
Lead Channel Impedance Value: 399 Ohm
Lead Channel Impedance Value: 551 Ohm
Lead Channel Impedance Value: 589 Ohm
Lead Channel Impedance Value: 608 Ohm
Lead Channel Impedance Value: 646 Ohm
Lead Channel Impedance Value: 665 Ohm
Lead Channel Pacing Threshold Amplitude: 0.75 V
Lead Channel Pacing Threshold Amplitude: 1.125 V
Lead Channel Pacing Threshold Amplitude: 1.25 V
Lead Channel Pacing Threshold Pulse Width: 0.4 ms
Lead Channel Pacing Threshold Pulse Width: 0.4 ms
Lead Channel Pacing Threshold Pulse Width: 0.8 ms
Lead Channel Sensing Intrinsic Amplitude: 1.25 mV
Lead Channel Sensing Intrinsic Amplitude: 12.125 mV
Lead Channel Setting Pacing Amplitude: 2 V
Lead Channel Setting Pacing Amplitude: 2.25 V
Lead Channel Setting Pacing Amplitude: 2.5 V
Lead Channel Setting Pacing Pulse Width: 0.4 ms
Lead Channel Setting Pacing Pulse Width: 0.8 ms
Lead Channel Setting Sensing Sensitivity: 0.9 mV
MDC IDC MSMT LEADCHNL LV IMPEDANCE VALUE: 399 Ohm
MDC IDC MSMT LEADCHNL RA SENSING INTR AMPL: 1.25 mV
MDC IDC MSMT LEADCHNL RV SENSING INTR AMPL: 12.125 mV

## 2018-11-22 ENCOUNTER — Other Ambulatory Visit: Payer: Self-pay

## 2018-11-22 DIAGNOSIS — I6523 Occlusion and stenosis of bilateral carotid arteries: Secondary | ICD-10-CM

## 2018-11-26 NOTE — Progress Notes (Signed)
Remote pacemaker transmission.   

## 2018-11-27 ENCOUNTER — Telehealth: Payer: Self-pay | Admitting: *Deleted

## 2018-11-27 NOTE — Telephone Encounter (Signed)
   TELEPHONE CALL NOTE  [name]. has been deemed a candidate for a follow-up tele-health visit to limit community exposure during the Covid-19 pandemic. I spoke with the patient via phone to ensure availability of phone/video source, confirm preferred email & phone number, and discuss instructions and expectations.  I reminded [name] to be prepared with any vital sign and/or heart rhythm information that could potentially be obtained via home monitoring, at the time of his visit. I reminded [name] to expect a phone call at the time of his visit if his visit.  Did the patient verbally acknowledge consent to treatment?  Darlyn Chamber, CMA 11/27/2018 1:26 PM

## 2018-11-29 ENCOUNTER — Ambulatory Visit (INDEPENDENT_AMBULATORY_CARE_PROVIDER_SITE_OTHER): Payer: Medicare HMO

## 2018-11-29 ENCOUNTER — Telehealth: Payer: Self-pay

## 2018-11-29 ENCOUNTER — Other Ambulatory Visit: Payer: Self-pay

## 2018-11-29 DIAGNOSIS — Z950 Presence of cardiac pacemaker: Secondary | ICD-10-CM

## 2018-11-29 DIAGNOSIS — I5042 Chronic combined systolic (congestive) and diastolic (congestive) heart failure: Secondary | ICD-10-CM | POA: Diagnosis not present

## 2018-11-29 NOTE — Progress Notes (Signed)
EPIC Encounter for ICM Monitoring  Patient Name: [name]. is a 85 y.o. male Date: 11/29/2018 Primary Care Physican: [name], MD Primary Cardiologist:Cooper/Weaver, PA Electrophysiologist:Allred Bi-V Pacing:80.5%(effective 85.3%) 10/22/2018 Weight: 170 lbs 11/29/2018 Weight: unknown  Clinical Status (31-Oct-2018 to 04-Nov-2018)  AT/AF 1 episodes   Time in AT/AF 24.0 hr/day (100%)  Observations (2) (29-Nov-2018 to 29-Nov-2018)  AT/AF >= 6 hr for 1 days.  V. Pacing less than 90%   Attempted call to patient and unable to reach.  Left detailed message per DPR regarding transmission. Transmission reviewed.   Thoracic impedancenormal.     Prescribed:Torsemide20 mg take 2 tabs (40 mg total) every morning and 1 tablet (20 mg) every evening. Potassium 20 mEq take 2 tabs (40 mEq total) every morning and 1 tab (20 mEq total) every evening.   Labs: 10/17/2018 Creatinine 1.48, BUN 31, Potassium 4.3, Sodium 140, GFR 43-49 10/07/2018 Creatinine 1.49, BUN 30, Potassium 4.4, Sodium 141, GFR 42-49 (results faxed today, 10/09/2018, from [facility]) 07/31/2018 Creatinine1.34, BUN34, Potassium3.5, Sodium138, [id number]  07/25/2018 Creatinine1.37, BUN29, Potassium4.2, Sodium140, OMB55-97  05/07/2018 Creatinine1.28, BUN32, Potassium3.8, Sodium140, CBU38-45 04/09/2018 Creatinine1.16, BUN22, Potassium4.0, Sodium142, XMI68-03 05/07/2018 Creatinine1.28, BUN32, Potassium3.8, Sodium140, OZY24-82  07/25/2018 Creatinine1.37, BUN29, Potassium4.2, NOIBBC488, QBV69-45  12/11/2019Creatinine 1.34, BUN34, Potassium3.5, WTUUEK800, LKJ17-91 04/02/2018 Creatinine 1.11, BUN 22, Potassium 3.8, Sodium 141, GFR 60-70 03/26/2018 Creatinine 1.40, BUN 26, Potassium 3.8, Sodium 142, GFR 46-53 A complete set of results can be found in results review.  Recommendations:     Follow-up plan: ICM clinic phone appointment on5/06/2019.Virtual office visit  with [name], PA on 12/02/2018 and Dr [name] on 12/06/2018.  Copy of ICM check sent to Dr.Allred and [name], PA.  AT/AF   3 month ICM trend: 11/29/2018    1 Year ICM trend:       Rosalene Billings, RN 11/29/2018 11:19 AM

## 2018-11-29 NOTE — Telephone Encounter (Signed)
Remote ICM transmission received.  Attempted call to patient regarding ICM remote transmission and left detailed message, per DPR, with next ICM remote transmission date of 12/30/2018.  Advised to return call for any fluid symptoms or questions.

## 2018-12-02 ENCOUNTER — Telehealth (INDEPENDENT_AMBULATORY_CARE_PROVIDER_SITE_OTHER): Payer: Medicare HMO | Admitting: Physician Assistant

## 2018-12-02 ENCOUNTER — Encounter (HOSPITAL_COMMUNITY): Payer: Medicare HMO

## 2018-12-02 ENCOUNTER — Other Ambulatory Visit: Payer: Self-pay

## 2018-12-02 ENCOUNTER — Ambulatory Visit: Payer: Medicare HMO | Admitting: Family

## 2018-12-02 VITALS — BP 101/52 | HR 92 | Ht 68.0 in | Wt 171.0 lb

## 2018-12-02 DIAGNOSIS — Z952 Presence of prosthetic heart valve: Secondary | ICD-10-CM | POA: Diagnosis not present

## 2018-12-02 DIAGNOSIS — Z950 Presence of cardiac pacemaker: Secondary | ICD-10-CM | POA: Diagnosis not present

## 2018-12-02 DIAGNOSIS — I25810 Atherosclerosis of coronary artery bypass graft(s) without angina pectoris: Secondary | ICD-10-CM

## 2018-12-02 DIAGNOSIS — Z7189 Other specified counseling: Secondary | ICD-10-CM

## 2018-12-02 DIAGNOSIS — I739 Peripheral vascular disease, unspecified: Secondary | ICD-10-CM

## 2018-12-02 DIAGNOSIS — N189 Chronic kidney disease, unspecified: Secondary | ICD-10-CM | POA: Diagnosis not present

## 2018-12-02 DIAGNOSIS — I2510 Atherosclerotic heart disease of native coronary artery without angina pectoris: Secondary | ICD-10-CM | POA: Diagnosis not present

## 2018-12-02 DIAGNOSIS — I5042 Chronic combined systolic (congestive) and diastolic (congestive) heart failure: Secondary | ICD-10-CM

## 2018-12-02 DIAGNOSIS — Z955 Presence of coronary angioplasty implant and graft: Secondary | ICD-10-CM | POA: Diagnosis not present

## 2018-12-02 DIAGNOSIS — Z8679 Personal history of other diseases of the circulatory system: Secondary | ICD-10-CM | POA: Diagnosis not present

## 2018-12-02 DIAGNOSIS — I779 Disorder of arteries and arterioles, unspecified: Secondary | ICD-10-CM

## 2018-12-02 DIAGNOSIS — Z7901 Long term (current) use of anticoagulants: Secondary | ICD-10-CM

## 2018-12-02 DIAGNOSIS — Z951 Presence of aortocoronary bypass graft: Secondary | ICD-10-CM

## 2018-12-02 DIAGNOSIS — I4819 Other persistent atrial fibrillation: Secondary | ICD-10-CM

## 2018-12-02 NOTE — Patient Instructions (Signed)
Medication Instructions:  No changes  If you need a refill on your cardiac medications before your next appointment, please call your pharmacy.   Lab work: None  If you have labs (blood work) drawn today and your tests are completely normal, you will receive your results only by: Marland Kitchen MyChart Message (if you have MyChart) OR . A paper copy in the mail If you have any lab test that is abnormal or we need to change your treatment, we will call you to review the results.  Testing/Procedures: None   Follow-Up: At [facility], you and your health needs are our priority.  As part of our continuing mission to provide you with exceptional heart care, we have created designated Provider Care Teams.  These Care Teams include your primary Cardiologist (physician) and Advanced Practice Providers (APPs -  Physician Assistants and Nurse Practitioners) who all work together to provide you with the care you need, when you need it. You will need a follow up appointment in:  3 months.  Please call our office 2 months in advance to schedule this appointment.  You may see [name], MD or [name], PA-C   Any Other Special Instructions Will Be Listed Below (If Applicable).
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
## 2018-12-03 ENCOUNTER — Telehealth: Payer: Self-pay

## 2018-12-03 NOTE — Telephone Encounter (Signed)
Left message for pt regarding appt on 12/06/18.

## 2018-12-04 ENCOUNTER — Other Ambulatory Visit (HOSPITAL_COMMUNITY): Payer: Medicare HMO

## 2018-12-05 ENCOUNTER — Ambulatory Visit (HOSPITAL_COMMUNITY): Payer: Medicare HMO | Admitting: Hematology

## 2018-12-05 DIAGNOSIS — I2510 Atherosclerotic heart disease of native coronary artery without angina pectoris: Secondary | ICD-10-CM | POA: Diagnosis not present

## 2018-12-05 DIAGNOSIS — I10 Essential (primary) hypertension: Secondary | ICD-10-CM | POA: Diagnosis not present

## 2018-12-05 DIAGNOSIS — E039 Hypothyroidism, unspecified: Secondary | ICD-10-CM | POA: Diagnosis not present

## 2018-12-06 ENCOUNTER — Encounter: Payer: Self-pay | Admitting: Internal Medicine

## 2018-12-06 ENCOUNTER — Other Ambulatory Visit: Payer: Self-pay

## 2018-12-06 ENCOUNTER — Telehealth (INDEPENDENT_AMBULATORY_CARE_PROVIDER_SITE_OTHER): Payer: Medicare HMO | Admitting: Internal Medicine

## 2018-12-06 VITALS — BP 110/55 | HR 89

## 2018-12-06 DIAGNOSIS — I4819 Other persistent atrial fibrillation: Secondary | ICD-10-CM | POA: Diagnosis not present

## 2018-12-06 DIAGNOSIS — I119 Hypertensive heart disease without heart failure: Secondary | ICD-10-CM

## 2018-12-06 DIAGNOSIS — I25810 Atherosclerosis of coronary artery bypass graft(s) without angina pectoris: Secondary | ICD-10-CM

## 2018-12-06 DIAGNOSIS — I10 Essential (primary) hypertension: Secondary | ICD-10-CM | POA: Diagnosis not present

## 2018-12-06 DIAGNOSIS — I5042 Chronic combined systolic (congestive) and diastolic (congestive) heart failure: Secondary | ICD-10-CM

## 2018-12-06 DIAGNOSIS — I441 Atrioventricular block, second degree: Secondary | ICD-10-CM
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
## 2018-12-09 ENCOUNTER — Ambulatory Visit (HOSPITAL_COMMUNITY): Payer: Medicare HMO | Admitting: Nurse Practitioner

## 2018-12-10 ENCOUNTER — Ambulatory Visit: Admit: 2018-12-10 | Payer: Medicare HMO | Admitting: Ophthalmology

## 2018-12-10 SURGERY — PARS PLANA VITRECTOMY WITH 25G REMOVAL/SUTURE INTRAOCULAR LENS
Anesthesia: Monitor Anesthesia Care | Laterality: Left

## 2018-12-12 DIAGNOSIS — I5042 Chronic combined systolic (congestive) and diastolic (congestive) heart failure: Secondary | ICD-10-CM | POA: Diagnosis not present

## 2018-12-12 DIAGNOSIS — N183 Chronic kidney disease, stage 3 (moderate): Secondary | ICD-10-CM | POA: Diagnosis not present

## 2018-12-16 ENCOUNTER — Other Ambulatory Visit: Payer: Self-pay

## 2018-12-16 ENCOUNTER — Ambulatory Visit (INDEPENDENT_AMBULATORY_CARE_PROVIDER_SITE_OTHER): Payer: Medicare HMO | Admitting: *Deleted

## 2018-12-16 DIAGNOSIS — I472 Ventricular tachycardia: Secondary | ICD-10-CM

## 2018-12-16 DIAGNOSIS — I442 Atrioventricular block, complete: Secondary | ICD-10-CM

## 2018-12-16 DIAGNOSIS — I4729 Other ventricular tachycardia: Secondary | ICD-10-CM

## 2018-12-16 LAB — CUP PACEART REMOTE DEVICE CHECK
Battery Remaining Longevity: 67 mo
Battery Voltage: 2.96 V
Brady Statistic AP VP Percent: 86.11 %
Brady Statistic AP VS Percent: 1.1 %
Brady Statistic AS VP Percent: 40.78 %
Brady Statistic AS VS Percent: 0.55 %
Brady Statistic RA Percent Paced: 0.08 %
Brady Statistic RV Percent Paced: 83.64 %
Date Time Interrogation Session: 20200427112903
Implantable Lead Implant Date: 20170907
Implantable Lead Implant Date: 20170907
Implantable Lead Implant Date: 20170907
Implantable Lead Location: 753858
Implantable Lead Location: 753859
Implantable Lead Location: 753860
Implantable Lead Model: 4598
Implantable Lead Model: 5076
Implantable Lead Model: 5076
Implantable Pulse Generator Implant Date: 20170907
Lead Channel Impedance Value: 304 Ohm
Lead Channel Impedance Value: 304 Ohm
Lead Channel Impedance Value: 304 Ohm
Lead Channel Impedance Value: 323 Ohm
Lead Channel Impedance Value: 342 Ohm
Lead Channel Impedance Value: 380 Ohm
Lead Channel Impedance Value: 399 Ohm
Lead Channel Impedance Value: 399 Ohm
Lead Channel Impedance Value: 399 Ohm
Lead Channel Impedance Value: 532 Ohm
Lead Channel Impedance Value: 551 Ohm
Lead Channel Impedance Value: 608 Ohm
Lead Channel Impedance Value: 627 Ohm
Lead Channel Impedance Value: 646 Ohm
Lead Channel Pacing Threshold Amplitude: 0.75 V
Lead Channel Pacing Threshold Amplitude: 1.125 V
Lead Channel Pacing Threshold Amplitude: 1.375 V
Lead Channel Pacing Threshold Pulse Width: 0.4 ms
Lead Channel Pacing Threshold Pulse Width: 0.4 ms
Lead Channel Pacing Threshold Pulse Width: 0.8 ms
Lead Channel Sensing Intrinsic Amplitude: 1.25 mV
Lead Channel Sensing Intrinsic Amplitude: 1.25 mV
Lead Channel Sensing Intrinsic Amplitude: 13.25 mV
Lead Channel Sensing Intrinsic Amplitude: 13.25 mV
Lead Channel Setting Pacing Amplitude: 2 V
Lead Channel Setting Pacing Amplitude: 2.5 V
Lead Channel Setting Pacing Amplitude: 2.5 V
Lead Channel Setting Pacing Pulse Width: 0.4 ms
Lead Channel Setting Pacing Pulse Width: 0.8 ms
Lead Channel Setting Sensing Sensitivity: 0.9 mV

## 2018-12-24 NOTE — Progress Notes (Signed)
Remote pacemaker transmission.   

## 2018-12-25 ENCOUNTER — Inpatient Hospital Stay (HOSPITAL_COMMUNITY): Payer: Medicare HMO | Attending: Hematology

## 2018-12-25 ENCOUNTER — Other Ambulatory Visit: Payer: Self-pay

## 2018-12-25 DIAGNOSIS — D649 Anemia, unspecified: Secondary | ICD-10-CM | POA: Diagnosis not present

## 2018-12-25 LAB — COMPREHENSIVE METABOLIC PANEL
ALT: 22 U/L (ref 0–44)
AST: 38 U/L (ref 15–41)
Albumin: 3.9 g/dL (ref 3.5–5.0)
Alkaline Phosphatase: 120 U/L (ref 38–126)
Anion gap: 10 (ref 5–15)
BUN: 35 mg/dL — ABNORMAL HIGH (ref 8–23)
CO2: 31 mmol/L (ref 22–32)
Calcium: 9.4 mg/dL (ref 8.9–10.3)
Chloride: 99 mmol/L (ref 98–111)
Creatinine, Ser: 1.32 mg/dL — ABNORMAL HIGH (ref 0.61–1.24)
GFR calc Af Amer: 57 mL/min — ABNORMAL LOW (ref >60)
GFR calc non Af Amer: 49 mL/min — ABNORMAL LOW (ref >60)
Glucose, Bld: 108 mg/dL — ABNORMAL HIGH (ref 70–99)
Potassium: 4 mmol/L (ref 3.5–5.1)
Sodium: 140 mmol/L (ref 135–145)
Total Bilirubin: 1.1 mg/dL (ref 0.3–1.2)
Total Protein: 7.5 g/dL (ref 6.5–8.1)

## 2018-12-25 LAB — CBC WITH DIFFERENTIAL/PLATELET
Abs Immature Granulocytes: 0.02 10*3/uL (ref 0.00–0.07)
Basophils Absolute: 0 10*3/uL (ref 0.0–0.1)
Basophils Relative: 0 %
Eosinophils Absolute: 0.1 10*3/uL (ref 0.0–0.5)
Eosinophils Relative: 2 %
HCT: 33.4 % — ABNORMAL LOW (ref 39.0–52.0)
Hemoglobin: 10.8 g/dL — ABNORMAL LOW (ref 13.0–17.0)
Immature Granulocytes: 0 %
Lymphocytes Relative: 13 %
Lymphs Abs: 0.7 10*3/uL (ref 0.7–4.0)
MCH: 32.8 pg (ref 26.0–34.0)
MCHC: 32.3 g/dL (ref 30.0–36.0)
MCV: 101.5 fL — ABNORMAL HIGH (ref 80.0–100.0)
Monocytes Absolute: 0.6 10*3/uL (ref 0.1–1.0)
Monocytes Relative: 11 %
Neutro Abs: 4 10*3/uL (ref 1.7–7.7)
Neutrophils Relative %: 74 %
Platelets: 123 10*3/uL — ABNORMAL LOW (ref 150–400)
RBC: 3.29 MIL/uL — ABNORMAL LOW (ref 4.22–5.81)
RDW: 15.1 % (ref 11.5–15.5)
WBC: 5.5 10*3/uL (ref 4.0–10.5)
nRBC: 0 % (ref 0.0–0.2)

## 2018-12-25 LAB — FOLATE: Folate: 20.3 ng/mL (ref >5.9)

## 2018-12-25 LAB — FERRITIN: Ferritin: 127 ng/mL (ref 24–336)

## 2018-12-25 LAB — LACTATE DEHYDROGENASE: LDH: 170 U/L (ref 98–192)

## 2018-12-25 LAB — VITAMIN B12: Vitamin B-12: 768 pg/mL (ref 180–914)

## 2018-12-26 LAB — PROTEIN ELECTROPHORESIS, SERUM
A/G Ratio: 1.1 (ref 0.7–1.7)
Albumin ELP: 3.6 g/dL (ref 2.9–4.4)
Alpha-1-Globulin: 0.3 g/dL (ref 0.0–0.4)
Alpha-2-Globulin: 0.7 g/dL (ref 0.4–1.0)
Beta Globulin: 0.8 g/dL (ref 0.7–1.3)
Gamma Globulin: 1.4 g/dL (ref 0.4–1.8)
Globulin, Total: 3.2 g/dL (ref 2.2–3.9)
Total Protein ELP: 6.8 g/dL (ref 6.0–8.5)

## 2018-12-27 LAB — METHYLMALONIC ACID, SERUM: Methylmalonic Acid, Quantitative: 320 nmol/L (ref 0–378)

## 2018-12-30 ENCOUNTER — Other Ambulatory Visit: Payer: Self-pay

## 2018-12-30 ENCOUNTER — Ambulatory Visit (INDEPENDENT_AMBULATORY_CARE_PROVIDER_SITE_OTHER): Payer: Medicare HMO

## 2018-12-30 DIAGNOSIS — I5042 Chronic combined systolic (congestive) and diastolic (congestive) heart failure: Secondary | ICD-10-CM | POA: Diagnosis not present

## 2018-12-30 DIAGNOSIS — Z950 Presence of cardiac pacemaker: Secondary | ICD-10-CM | POA: Diagnosis not present

## 2019-01-01 ENCOUNTER — Inpatient Hospital Stay (HOSPITAL_BASED_OUTPATIENT_CLINIC_OR_DEPARTMENT_OTHER): Payer: Medicare HMO | Admitting: Nurse Practitioner

## 2019-01-01 ENCOUNTER — Other Ambulatory Visit: Payer: Self-pay

## 2019-01-01 DIAGNOSIS — D649 Anemia, unspecified: Secondary | ICD-10-CM | POA: Diagnosis not present

## 2019-01-01 NOTE — Progress Notes (Signed)
EPIC Encounter for ICM Monitoring  Patient Name: [name]. is a 85 y.o. male Date: 01/01/2019 Primary Care Physican: [name], MD Primary Cardiologist:Cooper/Weaver, PA Electrophysiologist:Allred Bi-V Pacing:85.7%(effective84.8%) 10/22/2018 Weight: 170 lbs 01/01/2019 Weight: 172 lbs  Clinical Status (16-Dec-2018 to 30-Dec-2018)  AT/AF   19 episodes  Time in AT/AF        24.0 hr/day (100.0%)   Observations (3) (16-Dec-2018 to 30-Dec-2018) AT/AF >= 6 hr for 14 days.  V. Pacing less than 90%.  CRT Pacing is effective less than 90% of the time.   Transmission reviewed.  Spoke with patient.  He reports he is doing fine and denied any fluid symptoms.  Adhering to low salt diet.  Optivol Thoracic impedancenormal.  Prescribed:Torsemide20 mg take 2 tabs (40 mg total) every morning and 1 tablet (20 mg) every evening. Potassium 20 mEq take 2 tabs (40 mEq total) every morning and 1 tab (20 mEq total) every evening.   Labs: 12/25/2018 Creatinine 1.32, BUN 35, Potassium 4.0, Sodium 140, GFR 49-57 10/17/2018 Creatinine 1.48, BUN 31, Potassium 4.3, Sodium 140, GFR 43-49 10/07/2018 Creatinine 1.49, BUN 30, Potassium 4.4, Sodium 141, GFR 42-49 (results faxed today, 10/09/2018, from [facility]) A complete set of results can be found in results review.  Recommendations:No changes and encouraged to call if experiencing any fluid symptoms.   Follow-up plan: ICM clinic phone appointment on6/15/2020.  Copy of ICM check sent to Dr.Allred.  AT/AF 3 month   AT/AF 1 year    3 month ICM trend: 12/30/2018    1 Year ICM trend:       Rosalene Billings, RN 01/01/2019 8:42 AM

## 2019-01-01 NOTE — Progress Notes (Signed)
Virtual Visit via Telephone Note  I connected with [name]. on 01/01/19 at  1:00 PM EDT by telephone and verified that I am speaking with the correct person using two identifiers.   I discussed the limitations, risks, security and privacy concerns of performing an evaluation and management service by telephone and the availability of in person appointments. I also discussed with the patient that there may be a patient responsible charge related to this service. The patient expressed understanding and agreed to proceed.   History of Present Illness: Patient has been doing well since his last visit.  He denies any bright red bleeding per rectum or melena.  He does experience fatigue from time to time.  Otherwise he is doing great.  Denies any nausea, vomiting, or diarrhea. Denies any new pains. Had not noticed any recent bleeding such as epistaxis, hematuria or hematochezia. Denies recent chest pain on exertion, shortness of breath on minimal exertion, pre-syncopal episodes, or palpitations. Denies any numbness or tingling in hands or feet. Denies any recent fevers, infections, or recent hospitalizations. Patient reports appetite at 100% and energy level at 100%.  He is eating well and maintaining his weight at this time.    Observations/Objective: -Patient appeared alert and oriented and in no distress. - Physical assessment deferred due to to telephone visit.  Assessment and Plan: 1.  Normocytic anemia: - This is due to CKD and relative iron deficiency state. -He tried oral iron supplementation in the past and was unable to tolerate it due to constipation. - His last Feraheme infusion was 03/07/2018 and 03/14/2018. - He has been on Plavix for many years. -He denies any bright red bleeding per rectum or melena. -His last colonoscopy was 11/2011 which she had a tubular adenoma removed. -His work-up in office showed stool for occult blood was negative.  SPEP was negative. -Labs done on  12/25/2018 showed his hemoglobin at 10.8, ferritin 127, platelets 123, and creatinine 1.32. -He will follow-up in 3 months with repeat labs.  2.  Macrocytosis: - Labs on 12/25/2018 showed his B12 at 768, folate 20.3, methylmalonic acid at 320. - We will continue to monitor.  Follow Up Instructions: -Patient will follow-up in 3 months with repeat labs.   I discussed the assessment and treatment plan with the patient. The patient was provided an opportunity to ask questions and all were answered. The patient agreed with the plan and demonstrated an understanding of the instructions.   The patient was advised to call back or seek an in-person evaluation if the symptoms worsen or if the condition fails to improve as anticipated.  I provided 20 minutes of non-face-to-face time during this encounter. Patient does not have access to video chat.  Glennie [place], NP-C

## 2019-01-01 NOTE — Patient Instructions (Signed)
[facility] Discharge Instructions     Thank you for choosing [facility] at [facility] to provide your oncology and hematology care.  To afford each patient quality time with our provider, please arrive at least 15 minutes before your scheduled appointment time.   If you have a lab appointment with the Cancer Center please come in thru the  Main Entrance and check in at the main information desk  You need to re-schedule your appointment should you arrive 10 or more minutes late.  We strive to give you quality time with our providers, and arriving late affects you and other patients whose appointments are after yours.  Also, if you no show three or more times for appointments you may be dismissed from the clinic at the providers discretion.     Again, thank you for choosing [facility].  Our hope is that these requests will decrease the amount of time that you wait before being seen by our physicians.       _____________________________________________________________  Should you have questions after your visit to [facility], please contact our office at [phone] between the hours of 8:00 a.m. and 4:30 p.m.  Voicemails left after 4:00 p.m. will not be returned until the following business day.  For prescription refill requests, have your pharmacy contact our office and allow 72 hours.    Cancer Center Support Programs:   > Cancer Support Group  2nd Tuesday of the month 1pm-2pm, Journey Room    

## 2019-01-29 DIAGNOSIS — L821 Other seborrheic keratosis: Secondary | ICD-10-CM | POA: Diagnosis not present

## 2019-01-29 DIAGNOSIS — Z85820 Personal history of malignant melanoma of skin: Secondary | ICD-10-CM | POA: Diagnosis not present

## 2019-01-29 DIAGNOSIS — I872 Venous insufficiency (chronic) (peripheral): Secondary | ICD-10-CM | POA: Diagnosis not present

## 2019-01-29 DIAGNOSIS — D1801 Hemangioma of skin and subcutaneous tissue: Secondary | ICD-10-CM | POA: Diagnosis not present

## 2019-01-29 DIAGNOSIS — L570 Actinic keratosis: Secondary | ICD-10-CM | POA: Diagnosis not present

## 2019-01-29 DIAGNOSIS — Z85828 Personal history of other malignant neoplasm of skin: Secondary | ICD-10-CM | POA: Diagnosis not present

## 2019-01-29 DIAGNOSIS — Z86006 Personal history of melanoma in-situ: Secondary | ICD-10-CM | POA: Diagnosis not present

## 2019-02-03 ENCOUNTER — Ambulatory Visit (INDEPENDENT_AMBULATORY_CARE_PROVIDER_SITE_OTHER): Payer: Medicare HMO

## 2019-02-03 DIAGNOSIS — Z950 Presence of cardiac pacemaker: Secondary | ICD-10-CM

## 2019-02-03 DIAGNOSIS — I5042 Chronic combined systolic (congestive) and diastolic (congestive) heart failure: Secondary | ICD-10-CM | POA: Diagnosis not present

## 2019-02-04 NOTE — Progress Notes (Signed)
EPIC Encounter for ICM Monitoring  Patient Name: [name]. is a 85 y.o. male Date: 02/04/2019 Primary Care Physican: [name], MD Primary Cardiologist:Cooper/Weaver, PA Electrophysiologist:Allred Bi-V Pacing:86.7%(effective85.7%) 01/01/2019 Weight:172 lbs   Clinical Status (30-Dec-2018 to 03-February-2019)  AT/AF   42 episodes  Time in AT/AF        24.0 hr/day (100.0%)   Observations (3) (30-Dec-2018 to 03-February-2019) AT/AF >= 6 hr for 35 days.  V. Pacing less than 90%.  CRT Pacing is effective less than 90% of the time.   Transmission reviewed.  Spoke with patient.  He reports he is doing fine.  Adhering to low salt diet.  Optivol thoracic impedancenormal.  Prescribed:Torsemide20 mg take 2 tabs (40 mg total) every morning and 1 tablet (20 mg) every evening. Potassium 20 mEq take 2 tabs (40 mEq total) every morning and 1 tab (20 mEq total) every evening.   Labs: 12/25/2018 Creatinine 1.32, BUN 35, Potassium 4.0, Sodium 140, GFR 49-57 10/17/2018 Creatinine 1.48, BUN 31, Potassium 4.3, Sodium 140, GFR 43-49 10/07/2018 Creatinine 1.49, BUN 30, Potassium 4.4, Sodium 141, GFR 42-49  A complete set of results can be found in results review.  Recommendations:No changes and encouraged to call if experiencing any fluid symptoms.   Follow-up plan: ICM clinic phone appointment on7/28/2020.  Copy of ICM check sent to Dr.Allred.  AT/AF    3 month ICM trend: 02/03/2019    1 Year ICM trend:       Rosalene Billings, RN 02/04/2019 10:21 AM

## 2019-02-25 ENCOUNTER — Telehealth: Payer: Self-pay | Admitting: *Deleted

## 2019-03-02 NOTE — Progress Notes (Signed)
Cardiology Office Note:    Date:  03/10/2019   ID:  [name]., DOB 05-02-1931, MRN [id number]  PCP:  [name], MD  Cardiologist:  [name], MD   Electrophysiologist:  [name], MD   Referring MD: [name], MD   Chief Complaint  Patient presents with   Follow-up    CHF, CAD, AFib     History of Present Illness:    [name]. is a 85 y.o. male with:  Combined systolic and diastolic CHF EF 60-04  Coronary artery disease   S/p CABG in 2010  S/p multiple PCIs since CABG in 2010  S/p stent to the S-OM1 in 2017  S/p DES to the ostial and distal portions of the S-RPDA in 07/2017  S/p POBA to the S-RCA in the setting of a NSTEMI [facility], [place]) in 3/19  Persistent Atrial fibrillation   Contributed to worsening CHF 8/19 >> improved in normal sinus rhythm   S/p DCCV in 03/2018  Aortic stenosis  S/p TAVR 04/2016  Complete heart block  S/p CRT-P (Medtronic - MRI compatible)  Orthostatic Hypotension  [name] was last seen via Telemedicine 12/02/2018.  He was noted to be back in AFib on his device interrogation.  Volume status was stable.  He then saw Dr. [name] for follow up via Telemedicine who recommended continued rate control.  AAD and DCCV could be considered after COVID-19 restrictions are lifted.  He returns for follow up.  He is here alone.  Since last seen, he has not had any chest pain.  He has fatigue and shortness of breath that seems to be worse at times and better at others.  His symptoms are stable.  His weights are stable.  He has not had syncope.  His leg swelling is stable.    Prior CV studies:   The following studies were reviewed today:  Cardiac catheterization 11/12/2017 [facility], [place], [place]) LVEDP 16 EF 50% LM occluded RCA occluded SVG-RCA patent with mid stent with subtotal ISR SVG-OM patent stent LIMA-LAD patent PCI: POBA to the SVG-RCA   Echo 11/13/2017 [facility], [place], [place]) EF 20-25, diffuse HK, mild MR, normally functioning aortic valve prosthesis, mild to moderate TR   Cardiac Catheterization 08/09/17 LM ostial 100 RCA proximal 100-CTO SVG-RPDA ostial 90, distal 90 SVG-OM1 stent patent LIMA-LAD patent EF 40-45 Mean PCWP 20; LVEDP 22 PCI: 4 x 12 mm Resolute Onyx DES to the ostial/proximal SVG-RPDA  PCI:  3 x 22 mm Resolute Onyx DES to the distal SVG-RPDA Recommend: Continue clopidogrel without interruption, add aspirin 81 mg daily, diurese overnight with a single dose of IV Lasix and then continue oral furosemide.  If no complications arise should be okay for discharge tomorrow.   Carotid US 05/14/17 L CEA patent; RICA 40-59   Echo 05/09/17 Inferior akinesis, mild LVH, EF 40, status post TAVR with no perivalvular regurgitation, MAC, mild MR, moderate LAE, mild RAE, PASP 36   Echo 06/22/16 Mild LVH, EF 40-45, diffuse HK, grade 2 diastolic dysfunction, status post TAVR with no significant stenosis or perivalvular regurgitation, MAC, trivial MR, mild LAE, mild RAE, PASP 39   Past Medical History:  Diagnosis Date   Arthritis    Benign prostatic hypertrophy    CAD (coronary artery disease)    DES, SVG to circumflex marginal, October, 2010 ( SVG to PDA and PLA patent , LIMA to LAD patent, EF 60%, mild inferior hypokinesis 12/18  PCI/DES to SVG-PDA x2, EF 40% // NSTEMI 3/19 ([place]) >> S-OM and L-LAD ok; S-RCA stent sub-total ISR >> PCI:  POBA   Chronic systolic CHF (congestive heart failure) ([place])    Echo 9/18: Inferior akinesis, mild LVH, EF 40, status post TAVR with no perivalvular regurgitation, MAC, mild MR, moderate LAE, mild RAE, PASP 36 // Echo 3/19: EF 20-25, mild MR, AVR ok, mild to mod TR // Echo 6/19: Mild LVH, EF 30-35, diffuse HK, inferolateral and inferior AK, status post TAVR, no stenosis, no AI (mean gradient 5), MAC, mild MR, severe LAE, normal RVSF, moderate TR, PASP 50    Complete heart block ([place])    a. s/p MDT  CRTP 04/2016 Dr [name]   GERD (gastroesophageal reflux disease)    History of kidney stones 1970   /notes 01/03/2011   HTN (hypertension)    Hyperlipidemia    Hypothyroidism    Neuromuscular disorder (HCC)    Peripheral neuropathy    Persistent atrial fibrillation    Psoriasis    severe; with total skin exfoliation in the past resolved   S/P TAVR (transcatheter aortic valve replacement)    severe AS >> s/p TAVR 9/17 // Limited Echo 10/17: EF 40-45%, lat and inf-lat HK, TAVR ok, MAC, mild BAE, PASP 31 mmHg, no effusion   Ventricular tachycardia, non-sustained (HCC)    Surgical Hx: The patient  has a past surgical history that includes decompression of left median nerve; Colonoscopy (11/23/2011); Tonsillectomy; Appendectomy; Total knee arthroplasty (04/16/2012); Cataract extraction, bilateral; Carpal tunnel release (Bilateral, 2009-2011); left heart catheterization with coronary angiogram (N/A, 12/27/2012); TEE without cardioversion (N/A, 04/21/2016); Cardiac catheterization (N/A, 04/27/2016); Transcatheter aortic valve replacement, transfemoral (N/A, 05/16/2016); TEE without cardioversion (N/A, 05/16/2016); Cardiac catheterization (N/A, 11/17/2015); Cardiac catheterization (N/A, 11/17/2015); Cardiac catheterization (N/A, 04/19/2016); Coronary angioplasty with stent; Coronary angioplasty with stent (08/09/2017); Coronary angioplasty with stent (11/17/2015); Lumbar laminectomy (Bilateral, 09/2004); Carotid endarterectomy (Left, 02/2008); Synovial cyst excision (09/2004); Back surgery; Joint replacement; Lumbar disc surgery; Anterior cervical decomp/discectomy fusion; Kidney stone surgery; Coronary artery bypass graft (1995); RIGHT/LEFT HEART CATH AND CORONARY/GRAFT ANGIOGRAPHY (N/A, 08/09/2017); CORONARY STENT INTERVENTION (N/A, 08/09/2017); and Cardioversion (N/A, 03/27/2018).   Current Medications: Current Meds  Medication Sig   allopurinol (ZYLOPRIM) 300 MG tablet Take 300 mg by mouth daily.    apixaban (ELIQUIS) 5 MG TABS tablet Take 1 tablet (5 mg total) by mouth 2 (two) times daily.   atorvastatin (LIPITOR) 20 MG tablet Take 20 mg by mouth every evening.    docusate sodium (COLACE) 100 MG capsule Take 100 mg by mouth daily as needed for moderate constipation.    fluticasone (FLONASE) 50 MCG/ACT nasal spray Place 1 spray into both nostrils daily.    latanoprost (XALATAN) 0.005 % ophthalmic solution Place 1 drop into both eyes at bedtime.   levothyroxine (SYNTHROID, LEVOTHROID) 112 MCG tablet Take 112 mcg by mouth daily before breakfast.   Magnesium 250 MG TABS Take 250 mg by mouth 2 (two) times daily.    metoprolol tartrate (LOPRESSOR) 25 MG tablet Take 0.5 tablets (12.5 mg total) by mouth 2 (two) times daily.   multivitamin (THERAGRAN) per tablet Take 1 tablet by mouth at bedtime.    nitroGLYCERIN (NITROSTAT) 0.4 MG SL tablet Place 0.4 mg under the tongue every 5 (five) minutes x 3 doses as needed for chest pain.    potassium chloride SA (KLOR-CON M20) 20 MEQ tablet TAKE 2 TABS IN THE AM AND 1 TAB IN THE PM   pyridOXINE (VITAMIN B-6) 100  MG tablet Take 100 mg by mouth daily.    Tamsulosin HCl (FLOMAX) 0.4 MG CAPS Take 0.4 mg by mouth daily.    torsemide (DEMADEX) 20 MG tablet Take 2 tabs in the AM and 1 tab in the PM   triamcinolone cream (KENALOG) 0.1 % Apply 1 application topically. Pt is allergic to some of the ingredients but is able to use sparingly     Allergies:   Sulfamethoxazole, Other, and Propylene glycol   Social History   Tobacco Use   Smoking status: Never Smoker   Smokeless tobacco: Never Used  Substance Use Topics   Alcohol use: Yes    Alcohol/week: 1.0 standard drinks    Types: 1 Cans of beer per week   Drug use: No     Family Hx: The patient's family history includes Heart attack in his mother; Heart attack (age of onset: 3) in his son; Heart disease in his father and mother; Hyperlipidemia in his father; Hypertension in his father.  There is no history of Colon cancer.  ROS:   Please see the history of present illness.    ROS All other systems reviewed and are negative.   EKGs/Labs/Other Test Reviewed:    EKG:  EKG is  ordered today.  The ekg ordered today demonstrates V paced, HR 88, probable underlying AFlutter  Recent Labs: 12/25/2018: ALT 22; BUN 35; Creatinine, Ser 1.32; Hemoglobin 10.8; Platelets 123; Potassium 4.0; Sodium 140   Recent Lipid Panel Lab Results  Component Value Date/Time   CHOL 143 08/06/2006 08:14 AM   TRIG 141 08/06/2006 08:14 AM   HDL 29.7 (L) 08/06/2006 08:14 AM   CHOLHDL 4.8 CALC 08/06/2006 08:14 AM   LDLCALC 85 08/06/2006 08:14 AM    Physical Exam:    VS:  BP 110/66    Pulse 88    Ht _0  (1.727 m)    Wt 178 lb (80.7 kg)    SpO2 90%    BMI 27.06 kg/m     Wt Readings from Last 3 Encounters:  03/10/19 178 lb (80.7 kg)  12/02/18 171 lb (77.6 kg)  08/05/18 181 lb 11.2 oz (82.4 kg)     Physical Exam  Constitutional: He is oriented to person, place, and time. He appears well-developed and well-nourished. No distress.  HENT:  Head: Normocephalic and atraumatic.  Eyes: No scleral icterus.  Neck: No JVD present. No thyromegaly present.  Cardiovascular: Normal rate and regular rhythm.  Murmur heard.  Holosystolic murmur is present with a grade of 2/6 at the lower left sternal border. Pulmonary/Chest: Effort normal and breath sounds normal. He has no rales.  Abdominal: Soft. There is no hepatomegaly.  Musculoskeletal:        General: Edema (trace bilat LE edema) present.  Lymphadenopathy:    He has no cervical adenopathy.  Neurological: He is alert and oriented to person, place, and time.  Skin: Skin is warm and dry.  Psychiatric: He has a normal mood and affect.    ASSESSMENT & PLAN:    1. Chronic combined systolic and diastolic CHF (congestive heart failure) (HCC) EF 20-25.  NYHA 2b-3a.  His volume status seems stable.  His most recent thoracic impedance in June 2020 was  normal.  BP has limited titration of meds for CHF in the past.  Continue beta-blocker, torsemide.  FU with me or Dr. Burt Knack in 3 mos.   2. Persistent atrial fibrillation He is probably symptomatic with fatigue and occasional shortness of breath.  His volume  appears stable.  He had issues with worsening CHF in the past with AFib.  At his last visit with Dr. [name], the plan was to hold off on any rhythm control measures until COVID-19 restrictions were lifted.  I will review with Dr. [name] again to see if we should consider Amiodarone +/- DCCV now that we are doing routine procedures again.  Continue Apixaban.  Creatinine < 1.5 and weight > 60 kg.  Continue Apixaban 5 mg twice daily.   3. Coronary artery disease involving coronary bypass graft of native heart without angina pectoris Prior CABG and subsequent PCI of the SVG-OM1, DES x2 to the SVG-RCA and balloon angioplasty of the SVG-RCA.  He is not on ASA as he is on Apixaban.  Continue statin Rx.   4. Severe Aortic Stenosis S/P TAVR (transcatheter aortic valve replacement) Normal function by Echocardiogram in 2019.  Continue SBE prophylaxis.    5. CKD (chronic kidney disease) stage 3, GFR 30-59 ml/min (HCC) Recent creatinine stable.     Dispo:  Return in about 3 months (around 06/10/2019) for Routine Follow Up, w/ Dr. Burt Knack, or [name], PA-C.   Medication Adjustments/Labs and Tests Ordered: Current medicines are reviewed at length with the patient today.  Concerns regarding medicines are outlined above.  Tests Ordered: Orders Placed This Encounter  Procedures   EKG 12-Lead   Medication Changes: No orders of the defined types were placed in this encounter.   Signed, [name], PA-C  03/10/2019 11:59 AM    [facility] [address], [place], [place]  [address] Phone: [phone]; Fax: [phone]

## 2019-03-07 ENCOUNTER — Telehealth: Payer: Self-pay | Admitting: Physician Assistant

## 2019-03-07 NOTE — Telephone Encounter (Signed)

## 2019-03-10 ENCOUNTER — Ambulatory Visit: Payer: Medicare HMO | Admitting: Physician Assistant

## 2019-03-10 ENCOUNTER — Other Ambulatory Visit: Payer: Self-pay

## 2019-03-10 ENCOUNTER — Encounter (INDEPENDENT_AMBULATORY_CARE_PROVIDER_SITE_OTHER): Payer: Self-pay

## 2019-03-10 ENCOUNTER — Encounter: Payer: Self-pay | Admitting: Physician Assistant

## 2019-03-10 VITALS — BP 110/66 | HR 88 | Ht 68.0 in | Wt 178.0 lb

## 2019-03-10 DIAGNOSIS — I5042 Chronic combined systolic (congestive) and diastolic (congestive) heart failure: Secondary | ICD-10-CM | POA: Diagnosis not present

## 2019-03-10 DIAGNOSIS — N183 Chronic kidney disease, stage 3 unspecified: Secondary | ICD-10-CM

## 2019-03-10 DIAGNOSIS — I25810 Atherosclerosis of coronary artery bypass graft(s) without angina pectoris: Secondary | ICD-10-CM

## 2019-03-10 DIAGNOSIS — Z952 Presence of prosthetic heart valve: Secondary | ICD-10-CM | POA: Diagnosis not present

## 2019-03-10 DIAGNOSIS — I4819 Other persistent atrial fibrillation: Secondary | ICD-10-CM | POA: Diagnosis not present

## 2019-03-10 NOTE — Patient Instructions (Signed)
Medication Instructions:   Your physician recommends that you continue on your current medications as directed. Please refer to the Current Medication list given to you today.   If you need a refill on your cardiac medications before your next appointment, please call your pharmacy.   Lab work: NONE ORDERED  TODAY   If you have labs (blood work) drawn today and your tests are completely normal, you will receive your results only by: Marland Kitchen MyChart Message (if you have MyChart) OR . A paper copy in the mail If you have any lab test that is abnormal or we need to change your treatment, we will call you to review the results.  Testing/Procedures: NONE ORDERED  TODAY    Follow-Up: At [facility], you and your health needs are our priority.  As part of our continuing mission to provide you with exceptional heart care, we have created designated Provider Care Teams.  These Care Teams include your primary Cardiologist (physician) and Advanced Practice Providers (APPs -  Physician Assistants and Nurse Practitioners) who all work together to provide you with the care you need, when you need it. You will need a follow up appointment in:  3 months.  Please call our office 2 months in advance to schedule this appointment.  You may see [name], MD or one of the following Advanced Practice Providers on your designated Care Team: [name], PA-C [place], [place] . Daune Perch, NP  Any Other Special Instructions Will Be Listed Below (If Applicable).

## 2019-03-13 DIAGNOSIS — I482 Chronic atrial fibrillation, unspecified: Secondary | ICD-10-CM | POA: Diagnosis not present

## 2019-03-13 DIAGNOSIS — I10 Essential (primary) hypertension: Secondary | ICD-10-CM | POA: Diagnosis not present

## 2019-03-13 DIAGNOSIS — I5042 Chronic combined systolic (congestive) and diastolic (congestive) heart failure: Secondary | ICD-10-CM | POA: Diagnosis not present

## 2019-03-17 ENCOUNTER — Ambulatory Visit (INDEPENDENT_AMBULATORY_CARE_PROVIDER_SITE_OTHER): Payer: Medicare HMO | Admitting: *Deleted

## 2019-03-17 DIAGNOSIS — I442 Atrioventricular block, complete: Secondary | ICD-10-CM | POA: Diagnosis not present

## 2019-03-17 DIAGNOSIS — R55 Syncope and collapse: Secondary | ICD-10-CM

## 2019-03-17 LAB — CUP PACEART REMOTE DEVICE CHECK
Battery Remaining Longevity: 64 mo
Battery Voltage: 2.96 V
Brady Statistic AP VP Percent: 84.69 %
Brady Statistic AP VS Percent: 0.38 %
Brady Statistic AS VP Percent: 38.86 %
Brady Statistic AS VS Percent: 0.39 %
Brady Statistic RA Percent Paced: 0.1 %
Brady Statistic RV Percent Paced: 80.24 %
Date Time Interrogation Session: 20200727064044
Implantable Lead Implant Date: 20170907
Implantable Lead Implant Date: 20170907
Implantable Lead Implant Date: 20170907
Implantable Lead Location: 753858
Implantable Lead Location: 753859
Implantable Lead Location: 753860
Implantable Lead Model: 4598
Implantable Lead Model: 5076
Implantable Lead Model: 5076
Implantable Pulse Generator Implant Date: 20170907
Lead Channel Impedance Value: 285 Ohm
Lead Channel Impedance Value: 304 Ohm
Lead Channel Impedance Value: 304 Ohm
Lead Channel Impedance Value: 323 Ohm
Lead Channel Impedance Value: 342 Ohm
Lead Channel Impedance Value: 361 Ohm
Lead Channel Impedance Value: 380 Ohm
Lead Channel Impedance Value: 399 Ohm
Lead Channel Impedance Value: 418 Ohm
Lead Channel Impedance Value: 513 Ohm
Lead Channel Impedance Value: 532 Ohm
Lead Channel Impedance Value: 589 Ohm
Lead Channel Impedance Value: 608 Ohm
Lead Channel Impedance Value: 646 Ohm
Lead Channel Pacing Threshold Amplitude: 0.75 V
Lead Channel Pacing Threshold Amplitude: 1.125 V
Lead Channel Pacing Threshold Amplitude: 1.25 V
Lead Channel Pacing Threshold Pulse Width: 0.4 ms
Lead Channel Pacing Threshold Pulse Width: 0.4 ms
Lead Channel Pacing Threshold Pulse Width: 0.8 ms
Lead Channel Sensing Intrinsic Amplitude: 1.25 mV
Lead Channel Sensing Intrinsic Amplitude: 1.25 mV
Lead Channel Sensing Intrinsic Amplitude: 13.5 mV
Lead Channel Sensing Intrinsic Amplitude: 13.5 mV
Lead Channel Setting Pacing Amplitude: 2 V
Lead Channel Setting Pacing Amplitude: 2.25 V
Lead Channel Setting Pacing Amplitude: 2.5 V
Lead Channel Setting Pacing Pulse Width: 0.4 ms
Lead Channel Setting Pacing Pulse Width: 0.8 ms
Lead Channel Setting Sensing Sensitivity: 0.9 mV

## 2019-03-18 ENCOUNTER — Ambulatory Visit (INDEPENDENT_AMBULATORY_CARE_PROVIDER_SITE_OTHER): Payer: Medicare HMO

## 2019-03-18 DIAGNOSIS — Z950 Presence of cardiac pacemaker: Secondary | ICD-10-CM

## 2019-03-18 DIAGNOSIS — I5042 Chronic combined systolic (congestive) and diastolic (congestive) heart failure: Secondary | ICD-10-CM | POA: Diagnosis not present

## 2019-03-21 NOTE — Progress Notes (Signed)
EPIC Encounter for ICM Monitoring  Patient Name: [name]. is a 85 y.o. male Date: 03/21/2019 Primary Care Physican: [name], MD Primary Cardiologist:Cooper/Weaver, PA Electrophysiologist:Allred Bi-V Pacing:80.2%(effective76.1%) 01/01/2019 Weight:172 lbs 03/21/2019 Weight: 172-174 lbs   Clinical Status (25-Feb-2019 to 17-Mar-2019)   AT/AF 28 episodes  Time in AT/AF 24.0 hr/day (100.0%)  Observations (3) (25-Feb-2019 to 17-Mar-2019)  AT/AF >= 6 hr for 20 days.  V. Pacing less than 90%.  CRT Pacing is effective less than 90% of the time.   Transmission reviewed.Spoke with patient and he reports he is doing fine. He is trying to lose weight and is adhering to low salt diet.  He is having trouble with the monitor and provide Medtr[employer] tech support number.    Optivolthoracic impedancenormal.  Prescribed:Torsemide20 mg take 2 tabs (40 mg total) every morning and 1 tablet (20 mg) every evening. Potassium 20 mEq take 2 tabs (40 mEq total) every morning and 1 tab (20 mEq total) every evening.   Labs: 12/25/2018 Creatinine 1.32, BUN 35, Potassium 4.0, Sodium 140, GFR 49-57 10/17/2018 Creatinine 1.48, BUN 31, Potassium 4.3, Sodium 140, GFR 43-49 10/07/2018 Creatinine 1.49, BUN 30, Potassium 4.4, Sodium 141, GFR 42-49  A complete set of results can be found in results review.  Recommendations:No changes and encouraged to call if experiencing any fluid symptoms.  Follow-up plan: ICM clinic phone appointment on8/28/2020.  Copy of ICM check sent to Dr.Allred.   3 month ICM trend: 03/17/2019     1 Year ICM trend:       Rosalene Billings, RN 03/21/2019 11:11 AM

## 2019-04-01 ENCOUNTER — Other Ambulatory Visit: Payer: Self-pay

## 2019-04-01 ENCOUNTER — Inpatient Hospital Stay (HOSPITAL_COMMUNITY): Payer: Medicare HMO | Attending: Hematology

## 2019-04-01 DIAGNOSIS — D7589 Other specified diseases of blood and blood-forming organs: Secondary | ICD-10-CM | POA: Insufficient documentation

## 2019-04-01 DIAGNOSIS — D649 Anemia, unspecified: Secondary | ICD-10-CM

## 2019-04-01 DIAGNOSIS — D509 Iron deficiency anemia, unspecified: Secondary | ICD-10-CM | POA: Insufficient documentation

## 2019-04-01 DIAGNOSIS — N189 Chronic kidney disease, unspecified: Secondary | ICD-10-CM | POA: Diagnosis present

## 2019-04-01 DIAGNOSIS — D631 Anemia in chronic kidney disease: Secondary | ICD-10-CM | POA: Insufficient documentation

## 2019-04-01 LAB — FERRITIN: Ferritin: 80 ng/mL (ref 24–336)

## 2019-04-01 LAB — COMPREHENSIVE METABOLIC PANEL
ALT: 23 U/L (ref 0–44)
AST: 38 U/L (ref 15–41)
Albumin: 4.2 g/dL (ref 3.5–5.0)
Alkaline Phosphatase: 120 U/L (ref 38–126)
Anion gap: 12 (ref 5–15)
BUN: 43 mg/dL — ABNORMAL HIGH (ref 8–23)
CO2: 27 mmol/L (ref 22–32)
Calcium: 9.4 mg/dL (ref 8.9–10.3)
Chloride: 100 mmol/L (ref 98–111)
Creatinine, Ser: 1.48 mg/dL — ABNORMAL HIGH (ref 0.61–1.24)
GFR calc Af Amer: 49 mL/min — ABNORMAL LOW (ref >60)
GFR calc non Af Amer: 42 mL/min — ABNORMAL LOW (ref >60)
Glucose, Bld: 119 mg/dL — ABNORMAL HIGH (ref 70–99)
Potassium: 4.1 mmol/L (ref 3.5–5.1)
Sodium: 139 mmol/L (ref 135–145)
Total Bilirubin: 1.1 mg/dL (ref 0.3–1.2)
Total Protein: 7.7 g/dL (ref 6.5–8.1)

## 2019-04-01 LAB — CBC WITH DIFFERENTIAL/PLATELET
Abs Immature Granulocytes: 0.02 10*3/uL (ref 0.00–0.07)
Basophils Absolute: 0 10*3/uL (ref 0.0–0.1)
Basophils Relative: 0 %
Eosinophils Absolute: 0.1 10*3/uL (ref 0.0–0.5)
Eosinophils Relative: 2 %
HCT: 35.6 % — ABNORMAL LOW (ref 39.0–52.0)
Hemoglobin: 11.5 g/dL — ABNORMAL LOW (ref 13.0–17.0)
Immature Granulocytes: 0 %
Lymphocytes Relative: 15 %
Lymphs Abs: 0.8 10*3/uL (ref 0.7–4.0)
MCH: 32 pg (ref 26.0–34.0)
MCHC: 32.3 g/dL (ref 30.0–36.0)
MCV: 99.2 fL (ref 80.0–100.0)
Monocytes Absolute: 0.7 10*3/uL (ref 0.1–1.0)
Monocytes Relative: 12 %
Neutro Abs: 3.8 10*3/uL (ref 1.7–7.7)
Neutrophils Relative %: 71 %
Platelets: 111 10*3/uL — ABNORMAL LOW (ref 150–400)
RBC: 3.59 MIL/uL — ABNORMAL LOW (ref 4.22–5.81)
RDW: 16.7 % — ABNORMAL HIGH (ref 11.5–15.5)
WBC: 5.3 10*3/uL (ref 4.0–10.5)
nRBC: 0 % (ref 0.0–0.2)

## 2019-04-01 LAB — IRON AND TIBC
Iron: 37 ug/dL — ABNORMAL LOW (ref 45–182)
Saturation Ratios: 10 % — ABNORMAL LOW (ref 17.9–39.5)
TIBC: 356 ug/dL (ref 250–450)
UIBC: 319 ug/dL

## 2019-04-01 LAB — LACTATE DEHYDROGENASE: LDH: 170 U/L (ref 98–192)

## 2019-04-01 NOTE — Progress Notes (Signed)
Remote pacemaker transmission.   

## 2019-04-03 ENCOUNTER — Encounter (HOSPITAL_COMMUNITY): Payer: Self-pay | Admitting: Nurse Practitioner

## 2019-04-03 ENCOUNTER — Other Ambulatory Visit: Payer: Self-pay

## 2019-04-03 ENCOUNTER — Inpatient Hospital Stay (HOSPITAL_COMMUNITY): Payer: Medicare HMO | Admitting: Nurse Practitioner

## 2019-04-03 DIAGNOSIS — D631 Anemia in chronic kidney disease: Secondary | ICD-10-CM | POA: Diagnosis not present

## 2019-04-03 DIAGNOSIS — D649 Anemia, unspecified: Secondary | ICD-10-CM

## 2019-04-03 DIAGNOSIS — N189 Chronic kidney disease, unspecified: Secondary | ICD-10-CM | POA: Diagnosis not present

## 2019-04-03 DIAGNOSIS — D509 Iron deficiency anemia, unspecified: Secondary | ICD-10-CM | POA: Diagnosis not present

## 2019-04-03 DIAGNOSIS — D7589 Other specified diseases of blood and blood-forming organs: Secondary | ICD-10-CM | POA: Diagnosis not present

## 2019-04-03 NOTE — Patient Instructions (Signed)
[facility] Discharge Instructions  Follow up in 3 months with labs prior.    Thank you for choosing [facility] to provide your oncology and hematology care.  To afford each patient quality time with our provider, please arrive at least 15 minutes before your scheduled appointment time.   If you have a lab appointment with the Cancer Center please come in thru the  Main Entrance and check in at the main information desk  You need to re-schedule your appointment should you arrive 10 or more minutes late.  We strive to give you quality time with our providers, and arriving late affects you and other patients whose appointments are after yours.  Also, if you no show three or more times for appointments you may be dismissed from the clinic at the providers discretion.     Again, thank you for choosing [facility].  Our hope is that these requests will decrease the amount of time that you wait before being seen by our physicians.       _____________________________________________________________  Should you have questions after your visit to [facility], please contact our office at [phone] between the hours of 8:00 a.m. and 4:30 p.m.  Voicemails left after 4:00 p.m. will not be returned until the following business day.  For prescription refill requests, have your pharmacy contact our office and allow 72 hours.    Cancer Center Support Programs:   > Cancer Support Group  2nd Tuesday of the month 1pm-2pm, Journey Room    

## 2019-04-03 NOTE — Assessment & Plan Note (Addendum)
1.  Normocytic anemia: - This is due to CKD and relative iron deficiency state. -He tried oral iron supplementation in the past and was unable to tolerate it due to constipation. - His last Feraheme infusion was 03/07/2018 and 03/14/2018. - He has been on Plavix for many years. -He denies any bright red bleeding per rectum or melena. -His last colonoscopy was 11/2011 which she had a tubular adenoma removed. -His work-up in office showed stool for occult blood was negative.  SPEP was negative. -Labs done on 04/01/2019 showed his hemoglobin 11.5, ferritin 80, percent saturation 10, platelets 111, creatinine 2.48. -I have recommended 2 infusions of IV iron -He will follow-up in 3 months with repeat labs.  2.  Macrocytosis: - Labs on 12/25/2018 showed his B12 at 768, folate 20.3, methylmalonic acid at 320. - We will continue to monitor and check at his next visit.
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
## 2019-04-09 ENCOUNTER — Inpatient Hospital Stay (HOSPITAL_COMMUNITY): Payer: Medicare HMO

## 2019-04-09 ENCOUNTER — Telehealth: Payer: Self-pay | Admitting: Physician Assistant

## 2019-04-09 ENCOUNTER — Other Ambulatory Visit: Payer: Self-pay

## 2019-04-09 ENCOUNTER — Encounter (HOSPITAL_COMMUNITY): Payer: Self-pay

## 2019-04-09 DIAGNOSIS — D631 Anemia in chronic kidney disease: Secondary | ICD-10-CM | POA: Diagnosis not present

## 2019-04-09 DIAGNOSIS — D649 Anemia, unspecified: Secondary | ICD-10-CM

## 2019-04-09 DIAGNOSIS — D7589 Other specified diseases of blood and blood-forming organs: Secondary | ICD-10-CM | POA: Diagnosis not present

## 2019-04-09 DIAGNOSIS — N189 Chronic kidney disease, unspecified: Secondary | ICD-10-CM | POA: Diagnosis not present

## 2019-04-09 DIAGNOSIS — D509 Iron deficiency anemia, unspecified: Secondary | ICD-10-CM | POA: Diagnosis not present

## 2019-04-09 MED ORDER — SODIUM CHLORIDE 0.9 % IV SOLN
INTRAVENOUS | Status: DC
  Administered 2019-04-09: 14:00:00 via INTRAVENOUS

## 2019-04-09 MED ORDER — SODIUM CHLORIDE 0.9 % IV SOLN
510.0000 mg | Freq: Once | INTRAVENOUS | Status: AC
  Administered 2019-04-09: 510 mg via INTRAVENOUS
  Filled 2019-04-09: qty 510

## 2019-04-09 NOTE — Patient Instructions (Signed)
[facility]  Discharge Instructions:   _______________________________________________________________  Thank you for choosing [facility] to provide your oncology and hematology care.  To afford each patient quality time with our providers, please arrive at least 15 minutes before your scheduled appointment.  You need to re-schedule your appointment if you arrive 10 or more minutes late.  We strive to give you quality time with our providers, and arriving late affects you and other patients whose appointments are after yours.  Also, if you no show three or more times for appointments you may be dismissed from the clinic.  Again, thank you for choosing [facility] at  Hospital. Our hope is that these requests will allow you access to exceptional care and in a timely manner. _______________________________________________________________  If you have questions after your visit, please contact our office at [phone] between the hours of 8:30 a.m. and 5:00 p.m. Voicemails left after 4:30 p.m. will not be returned until the following business day. _______________________________________________________________  For prescription refill requests, have your pharmacy contact our office. _______________________________________________________________  Recommendations made by the consultant and any test results will be sent to your referring physician. _______________________________________________________________ 

## 2019-04-09 NOTE — Telephone Encounter (Signed)
lvm will call pt tomorrow am.

## 2019-04-09 NOTE — Telephone Encounter (Signed)
Please call Mr. Mccambridge. I reviewed his case with Dr. [name].  We think that we should try to get his heart back in normal sinus rhythm.  Therefore, we would like to start him on Amiodarone and then follow up in a 2-3 weeks to discuss proceeding with a cardioversion.   PLAN:  1. Start Amiodarone 400 mg twice daily x 1 week, then reduce to 200 mg twice daily. 2. Make sure he does not miss a dose of Eliquis.  If he does let us know.  If he has recently missed, do not start Amiodarone and let me know. 3. Follow up with [name] in 2-3 weeks in person.  He will need an EKG at that appointment.   4. Call if fatigue, shortness of breath, leg swelling gets worse.   [name], PA-C    04/09/2019 1:36 PM

## 2019-04-09 NOTE — Progress Notes (Signed)
Feraheme given today per orders. Patient tolerated it well without problems. Vitals stable and discharged home from clinic ambulatory. Follow up as scheduled.  

## 2019-04-10 ENCOUNTER — Other Ambulatory Visit: Payer: Self-pay | Admitting: *Deleted

## 2019-04-10 MED ORDER — AMIODARONE HCL 200 MG PO TABS: ORAL_TABLET | ORAL | 3 refills | Status: DC

## 2019-04-10 NOTE — Telephone Encounter (Signed)
S/w pt and pt's wife.  Aware of recommendation's  Will start amio two tablets (400mg ) bid X1 week than one tablet (200 mg) bid. Medication sent in today to requested pharmacy. Pt has not missed a dose of Eliquis.  Will come in on 8/31 to see Dr.Allred with EKG.  If pt needs to be seen after has a VT appt with [name] on 9/11.  Two days prior if necessary pt will send in a transmission, device clinic aware.       Virtual Visit Pre-Appointment Phone Call  "(Name), I am calling you today to discuss your upcoming appointment. We are currently trying to limit exposure to the virus that causes COVID-19 by seeing patients at home rather than in the office."  1. "What is the BEST phone number to call the day of the visit?" - include this in appointment notes  2. "Do you have or have access to (through a family member/friend) a smartphone with video capability that we can use for your visit?" a. If yes - list this number in appt notes as "cell" (if different from BEST phone #) and list the appointment type as a VIDEO visit in appointment notes b. If no - list the appointment type as a PHONE visit in appointment notes  3. Confirm consent - "In the setting of the current Covid19 crisis, you are scheduled for a (phone or video) visit with your provider on (Sept 11) at (10:45 am ).  Just as we do with many in-office visits, in order for you to participate in this visit, we must obtain consent.  If you'd like, I can send this to your mychart (if signed up) or email for you to review.  Otherwise, I can obtain your verbal consent now.  All virtual visits are billed to your insurance company just like a normal visit would be.  By agreeing to a virtual visit, we'd like you to understand that the technology does not allow for your provider to perform an examination, and thus may limit your provider's ability to fully assess your condition. If your provider identifies any concerns that need to be evaluated in  person, we will make arrangements to do so.  Finally, though the technology is pretty good, we cannot assure that it will always work on either your or our end, and in the setting of a video visit, we may have to convert it to a phone-only visit.  In either situation, we cannot ensure that we have a secure connection.  Are you willing to proceed?" STAFF: Did the patient verbally acknowledge consent to telehealth visit? Document YES/NO here: YES  4. Advise patient to be prepared - "Two hours prior to your appointment, go ahead and check your blood pressure, pulse, oxygen saturation, and your weight (if you have the equipment to check those) and write them all down. When your visit starts, your provider will ask you for this information. If you have an Apple Watch or Kardia device, please plan to have heart rate information ready on the day of your appointment. Please have a pen and paper handy nearby the day of the visit as well."  5. Give patient instructions for MyChart download to smartphone OR Doximity/Doxy.me as below if video visit (depending on what platform provider is using)  6. Inform patient they will receive a phone call 15 minutes prior to their appointment time (may be from unknown caller ID) so they should be prepared to answer    TELEPHONE CALL  NOTE  Daniel Klabunde. has been deemed a candidate for a follow-up tele-health visit to limit community exposure during the Covid-19 pandemic. I spoke with the patient via phone to ensure availability of phone/video source, confirm preferred email & phone number, and discuss instructions and expectations.  I reminded Daniel Dandy. to be prepared with any vital sign and/or heart rhythm information that could potentially be obtained via home monitoring, at the time of his visit. I reminded Daniel Dandy. to expect a phone call prior to his visit.  [name] 04/10/2019 9:35 AM   INSTRUCTIONS FOR DOWNLOADING THE MYCHART  APP TO SMARTPHONE  - The patient must first make sure to have activated MyChart and know their login information - If Apple, go to App Store and type in MyChart in the search bar and download the app. If Android, ask patient to go to [employer] and type in [place] in the search bar and download the app. The app is free but as with any other app downloads, their phone may require them to verify saved payment information or Apple/Android password.  - The patient will need to then log into the app with their MyChart username and password, and select [facility] as their healthcare provider to link the account. When it is time for your visit, go to the MyChart app, find appointments, and click Begin Video Visit. Be sure to Select Allow for your device to access the Microphone and Camera for your visit. You will then be connected, and your provider will be with you shortly.  **If they have any issues connecting, or need assistance please contact MyChart service desk [phone])**  **If using a computer, in order to ensure the best quality for their visit they will need to use either of the following Internet Browsers: [employer], or Google Chrome**  IF USING DOXIMITY or DOXY.ME - The patient will receive a link just prior to their visit by text.     FULL LENGTH CONSENT FOR TELE-HEALTH VISIT   I hereby voluntarily request, consent and authorize [facility] and its employed or contracted physicians, physician assistants, nurse practitioners or other licensed health care professionals (the Practitioner), to provide me with telemedicine health care services (the "Services") as deemed necessary by the treating Practitioner. I acknowledge and consent to receive the Services by the Practitioner via telemedicine. I understand that the telemedicine visit will involve communicating with the Practitioner through live audiovisual communication technology and the disclosure of certain  medical information by electronic transmission. I acknowledge that I have been given the opportunity to request an in-person assessment or other available alternative prior to the telemedicine visit and am voluntarily participating in the telemedicine visit.  I understand that I have the right to withhold or withdraw my consent to the use of telemedicine in the course of my care at any time, without affecting my right to future care or treatment, and that the Practitioner or I may terminate the telemedicine visit at any time. I understand that I have the right to inspect all information obtained and/or recorded in the course of the telemedicine visit and may receive copies of available information for a reasonable fee.  I understand that some of the potential risks of receiving the Services via telemedicine include:  Marland Kitchen Delay or interruption in medical evaluation due to technological equipment failure or disruption; . Information transmitted may not be sufficient (e.g. poor resolution of images) to allow for appropriate  medical decision making by the Practitioner; and/or  . In rare instances, security protocols could fail, causing a breach of personal health information.  Furthermore, I acknowledge that it is my responsibility to provide information about my medical history, conditions and care that is complete and accurate to the best of my ability. I acknowledge that Practitioner's advice, recommendations, and/or decision may be based on factors not within their control, such as incomplete or inaccurate data provided by me or distortions of diagnostic images or specimens that may result from electronic transmissions. I understand that the practice of medicine is not an exact science and that Practitioner makes no warranties or guarantees regarding treatment outcomes. I acknowledge that I will receive a copy of this consent concurrently upon execution via email to the email address I last provided but may  also request a printed copy by calling the office of [facility].    I understand that my insurance will be billed for this visit.   I have read or had this consent read to me. . I understand the contents of this consent, which adequately explains the benefits and risks of the Services being provided via telemedicine.  . I have been provided ample opportunity to ask questions regarding this consent and the Services and have had my questions answered to my satisfaction. . I give my informed consent for the services to be provided through the use of telemedicine in my medical care  By participating in this telemedicine visit I agree to the above.

## 2019-04-14 ENCOUNTER — Ambulatory Visit (INDEPENDENT_AMBULATORY_CARE_PROVIDER_SITE_OTHER): Payer: Medicare HMO | Admitting: Family

## 2019-04-14 ENCOUNTER — Encounter: Payer: Self-pay | Admitting: Family

## 2019-04-14 ENCOUNTER — Other Ambulatory Visit: Payer: Self-pay

## 2019-04-14 ENCOUNTER — Ambulatory Visit (HOSPITAL_COMMUNITY)
Admission: RE | Admit: 2019-04-14 | Discharge: 2019-04-14 | Disposition: A | Discharge status: Home / Self Care | Payer: Medicare HMO | Source: Ambulatory Visit | Attending: Family | Admitting: Family

## 2019-04-14 VITALS — BP 114/68 | HR 85 | Temp 96.5°F | Resp 18 | Ht 68.0 in | Wt 175.0 lb

## 2019-04-14 DIAGNOSIS — I6523 Occlusion and stenosis of bilateral carotid arteries: Secondary | ICD-10-CM | POA: Insufficient documentation

## 2019-04-14 DIAGNOSIS — Z9889 Other specified postprocedural states: Secondary | ICD-10-CM

## 2019-04-14 NOTE — Patient Instructions (Signed)
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
## 2019-04-16 ENCOUNTER — Encounter (HOSPITAL_COMMUNITY): Payer: Self-pay

## 2019-04-16 ENCOUNTER — Inpatient Hospital Stay (HOSPITAL_COMMUNITY): Payer: Medicare HMO

## 2019-04-16 ENCOUNTER — Other Ambulatory Visit: Payer: Self-pay

## 2019-04-16 VITALS — BP 118/62 | HR 71 | Temp 97.4°F | Resp 18

## 2019-04-16 DIAGNOSIS — N189 Chronic kidney disease, unspecified: Secondary | ICD-10-CM | POA: Diagnosis not present

## 2019-04-16 DIAGNOSIS — D649 Anemia, unspecified: Secondary | ICD-10-CM

## 2019-04-16 DIAGNOSIS — D631 Anemia in chronic kidney disease: Secondary | ICD-10-CM | POA: Diagnosis not present

## 2019-04-16 DIAGNOSIS — D7589 Other specified diseases of blood and blood-forming organs: Secondary | ICD-10-CM | POA: Diagnosis not present

## 2019-04-16 DIAGNOSIS — D509 Iron deficiency anemia, unspecified: Secondary | ICD-10-CM | POA: Diagnosis not present

## 2019-04-16 MED ORDER — SODIUM CHLORIDE 0.9 % IV SOLN
Freq: Once | INTRAVENOUS | Status: AC
  Administered 2019-04-16: 14:00:00 via INTRAVENOUS

## 2019-04-16 MED ORDER — SODIUM CHLORIDE 0.9 % IV SOLN
200.0000 mg | Freq: Once | INTRAVENOUS | Status: AC
  Administered 2019-04-16: 200 mg via INTRAVENOUS
  Filled 2019-04-16: qty 10

## 2019-04-16 NOTE — Progress Notes (Signed)
Patient tolerated iron infusion with no complaints voiced.  Peripheral IV site clean and dry with good blood return noted before and after infusion.  Band aid applied.  VSS with discharge and left ambulatory with no s/s of distress noted.  

## 2019-04-16 NOTE — Progress Notes (Signed)
Pt presents today for Venofer due to pt's insurance. VSS. Pt has no complaints of pain or any changes since the last visit. MAR reviewed.

## 2019-04-18 ENCOUNTER — Telehealth: Payer: Self-pay

## 2019-04-18 ENCOUNTER — Ambulatory Visit (INDEPENDENT_AMBULATORY_CARE_PROVIDER_SITE_OTHER): Payer: Medicare HMO

## 2019-04-18 DIAGNOSIS — I5042 Chronic combined systolic (congestive) and diastolic (congestive) heart failure: Secondary | ICD-10-CM

## 2019-04-18 DIAGNOSIS — Z950 Presence of cardiac pacemaker: Secondary | ICD-10-CM

## 2019-04-18 NOTE — Telephone Encounter (Signed)
Spoke with pt regarding appt on 04/21/19. Pt stated he would rather do a phone call for his appt. Pt also stated he will check his vitals prior to his appt. Pt questions and concerns were address.

## 2019-04-21 ENCOUNTER — Other Ambulatory Visit: Payer: Self-pay

## 2019-04-21 ENCOUNTER — Telehealth (INDEPENDENT_AMBULATORY_CARE_PROVIDER_SITE_OTHER): Payer: Medicare HMO | Admitting: Internal Medicine

## 2019-04-21 ENCOUNTER — Encounter: Payer: Self-pay | Admitting: Internal Medicine

## 2019-04-21 VITALS — BP 105/48 | HR 76 | Ht 68.0 in | Wt 175.0 lb

## 2019-04-21 DIAGNOSIS — I4819 Other persistent atrial fibrillation: Secondary | ICD-10-CM | POA: Diagnosis not present

## 2019-04-21 DIAGNOSIS — Z950 Presence of cardiac pacemaker: Secondary | ICD-10-CM | POA: Diagnosis not present

## 2019-04-21 DIAGNOSIS — I5042 Chronic combined systolic (congestive) and diastolic (congestive) heart failure: Secondary | ICD-10-CM | POA: Diagnosis not present

## 2019-04-21 DIAGNOSIS — I25810 Atherosclerosis of coronary artery bypass graft(s) without angina pectoris: Secondary | ICD-10-CM

## 2019-04-21 MED ORDER — AMIODARONE HCL 200 MG PO TABS: 200.0000 mg | ORAL_TABLET | Freq: Every day | ORAL | 3 refills | Status: AC
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
## 2019-04-22 NOTE — Progress Notes (Signed)
EPIC Encounter for ICM Monitoring  Patient Name: [name]. is a 85 y.o. male Date: 04/22/2019 Primary Care Physican: [name], MD Primary Cardiologist:Cooper/Weaver, PA Electrophysiologist:Allred Bi-V Pacing:89.8%(effective86%) 04/21/2019 Weight: 172-174 lbs  Clinical Status (22-Mar-2019 to 18-Apr-2019)  AT/AF                 147 episodes  Time in AT/AF   21.4 hr/day (89.0%)  Observations (3) (22-Mar-2019 to 18-Apr-2019)  AT/AF >= 6 hr for 25 days  V. Pacing less than 90%  CRT Pacing is effective less than 90% of the time  Spoke with patient. He says he feels weak.  Per telehealth visit with Dr [name] patient started Amiodarone.  He said he will be having cardioversion scheduled.  Optivolthoracic impedancenormal.  Prescribed:Torsemide20 mg take 2 tabs (40 mg total) every morning and 1 tablet (20 mg) every evening. Potassium 20 mEq take 2 tabs (40 mEq total) every morning and 1 tab (20 mEq total) every evening.   Labs: 12/25/2018 Creatinine 1.32, BUN 35, Potassium 4.0, Sodium 140, GFR 49-57 10/17/2018 Creatinine 1.48, BUN 31, Potassium 4.3, Sodium 140, GFR 43-49 10/07/2018 Creatinine 1.49, BUN 30, Potassium 4.4, Sodium 141, GFR 42-49  A complete set of results can be found in results review.  Recommendations:No changes and encouraged to call if experiencing any fluid symptoms.  Follow-up plan: ICM clinic phone appointment on10/21/2020. Device clinic remote transmission scheduled 04/30/2019.  OV with [name], PA 06/13/2019.  Copy of ICM check sent to Dr.Allred.   3 month ICM trend: 04/18/2019    1 Year ICM trend:       Rosalene Billings, RN 04/22/2019 3:39 PM

## 2019-04-23 ENCOUNTER — Ambulatory Visit (HOSPITAL_COMMUNITY): Payer: Medicare HMO

## 2019-04-24 ENCOUNTER — Telehealth: Payer: Self-pay

## 2019-04-24 DIAGNOSIS — I25810 Atherosclerosis of coronary artery bypass graft(s) without angina pectoris: Secondary | ICD-10-CM

## 2019-04-24 DIAGNOSIS — Z01810 Encounter for preprocedural cardiovascular examination: Secondary | ICD-10-CM

## 2019-04-24 DIAGNOSIS — R55 Syncope and collapse: Secondary | ICD-10-CM

## 2019-04-24 DIAGNOSIS — Z950 Presence of cardiac pacemaker: Secondary | ICD-10-CM

## 2019-04-24 NOTE — Telephone Encounter (Signed)
Spoke with the pt regarding his cardioversion and since he is having GI issues this week, he prefers to wait until next week.   He would like to have his COVID testing and labs in [place], but with Monday being Labor Day it will need to be set up thereafter. [phone]  Tried to put the pt on per his request for Friday 05/02/19 with Dr. Stanford Breed, but according to central scheduling all out patient spots are full, so I spoke with endoscopy [phone] and we can call back tomorrow to speak with the charge nurse to take one of the several open inpatient spots. She has gone for the day today.   Pt aware we will be calling him with all of his information. Will forward to [place].   Letter pending.

## 2019-04-25 ENCOUNTER — Other Ambulatory Visit: Payer: Self-pay

## 2019-04-25 DIAGNOSIS — I25810 Atherosclerosis of coronary artery bypass graft(s) without angina pectoris: Secondary | ICD-10-CM

## 2019-04-25 DIAGNOSIS — R55 Syncope and collapse: Secondary | ICD-10-CM

## 2019-04-25 DIAGNOSIS — Z950 Presence of cardiac pacemaker: Secondary | ICD-10-CM

## 2019-04-25 DIAGNOSIS — Z01810 Encounter for preprocedural cardiovascular examination: Secondary | ICD-10-CM

## 2019-04-25 NOTE — Telephone Encounter (Signed)
Called pt and he verbalized understanding of his Cardioversion 05/02/19 at 12 noon with Dr. Stanford Breed and arrive 11am... labs and COVID testing 04/29/19 at [facility].   Pt to call if he has any questions.

## 2019-04-25 NOTE — Telephone Encounter (Signed)
Left detailed message for Pt advising covid testing is now in front of [facility]/near where Pt will get his lab work.  Advised to call office if any questions.

## 2019-04-29 ENCOUNTER — Other Ambulatory Visit: Payer: Self-pay

## 2019-04-29 ENCOUNTER — Other Ambulatory Visit (HOSPITAL_COMMUNITY)
Admission: RE | Admit: 2019-04-29 | Discharge: 2019-04-29 | Disposition: A | Discharge status: Home / Self Care | Payer: Medicare HMO | Source: Ambulatory Visit | Attending: Nurse Practitioner | Admitting: Nurse Practitioner

## 2019-04-29 ENCOUNTER — Other Ambulatory Visit (HOSPITAL_COMMUNITY)
Admission: RE | Admit: 2019-04-29 | Discharge: 2019-04-29 | Disposition: A | Discharge status: Home / Self Care | Payer: Medicare HMO | Source: Ambulatory Visit | Attending: Cardiology | Admitting: Cardiology

## 2019-04-29 ENCOUNTER — Other Ambulatory Visit (HOSPITAL_COMMUNITY)
Admission: RE | Admit: 2019-04-29 | Discharge: 2019-04-29 | Disposition: A | Discharge status: Home / Self Care | Payer: Medicare HMO | Source: Ambulatory Visit | Attending: Internal Medicine | Admitting: Internal Medicine

## 2019-04-29 DIAGNOSIS — Z01812 Encounter for preprocedural laboratory examination: Secondary | ICD-10-CM | POA: Insufficient documentation

## 2019-04-29 DIAGNOSIS — Z20828 Contact with and (suspected) exposure to other viral communicable diseases: Secondary | ICD-10-CM | POA: Insufficient documentation

## 2019-04-29 LAB — CBC WITH DIFFERENTIAL/PLATELET
Abs Immature Granulocytes: 0.02 10*3/uL (ref 0.00–0.07)
Basophils Absolute: 0 10*3/uL (ref 0.0–0.1)
Basophils Relative: 0 %
Eosinophils Absolute: 0.1 10*3/uL (ref 0.0–0.5)
Eosinophils Relative: 1 %
HCT: 36.4 % — ABNORMAL LOW (ref 39.0–52.0)
Hemoglobin: 11.7 g/dL — ABNORMAL LOW (ref 13.0–17.0)
Immature Granulocytes: 0 %
Lymphocytes Relative: 15 %
Lymphs Abs: 1 10*3/uL (ref 0.7–4.0)
MCH: 32.7 pg (ref 26.0–34.0)
MCHC: 32.1 g/dL (ref 30.0–36.0)
MCV: 101.7 fL — ABNORMAL HIGH (ref 80.0–100.0)
Monocytes Absolute: 0.7 10*3/uL (ref 0.1–1.0)
Monocytes Relative: 10 %
Neutro Abs: 4.7 10*3/uL (ref 1.7–7.7)
Neutrophils Relative %: 74 %
Platelets: 120 10*3/uL — ABNORMAL LOW (ref 150–400)
RBC: 3.58 MIL/uL — ABNORMAL LOW (ref 4.22–5.81)
RDW: 18.1 % — ABNORMAL HIGH (ref 11.5–15.5)
WBC: 6.4 10*3/uL (ref 4.0–10.5)
nRBC: 0 % (ref 0.0–0.2)

## 2019-04-29 LAB — BASIC METABOLIC PANEL
Anion gap: 11 (ref 5–15)
BUN: 37 mg/dL — ABNORMAL HIGH (ref 8–23)
CO2: 28 mmol/L (ref 22–32)
Calcium: 9 mg/dL (ref 8.9–10.3)
Chloride: 98 mmol/L (ref 98–111)
Creatinine, Ser: 1.91 mg/dL — ABNORMAL HIGH (ref 0.61–1.24)
GFR calc Af Amer: 36 mL/min — ABNORMAL LOW (ref >60)
GFR calc non Af Amer: 31 mL/min — ABNORMAL LOW (ref >60)
Glucose, Bld: 98 mg/dL (ref 70–99)
Potassium: 4.2 mmol/L (ref 3.5–5.1)
Sodium: 137 mmol/L (ref 135–145)

## 2019-04-30 ENCOUNTER — Ambulatory Visit (INDEPENDENT_AMBULATORY_CARE_PROVIDER_SITE_OTHER): Payer: Medicare HMO | Admitting: *Deleted

## 2019-04-30 ENCOUNTER — Ambulatory Visit (HOSPITAL_COMMUNITY): Payer: Medicare HMO

## 2019-04-30 DIAGNOSIS — I4819 Other persistent atrial fibrillation: Secondary | ICD-10-CM

## 2019-04-30 DIAGNOSIS — I472 Ventricular tachycardia: Secondary | ICD-10-CM

## 2019-04-30 DIAGNOSIS — I4729 Other ventricular tachycardia: Secondary | ICD-10-CM

## 2019-04-30 LAB — SARS CORONAVIRUS 2 (TAT 6-24 HRS): SARS Coronavirus 2: NEGATIVE

## 2019-05-01 LAB — CUP PACEART REMOTE DEVICE CHECK
Battery Remaining Longevity: 57 mo
Battery Voltage: 2.95 V
Brady Statistic AP VP Percent: 4.55 %
Brady Statistic AP VS Percent: 0.01 %
Brady Statistic AS VP Percent: 95.31 %
Brady Statistic AS VS Percent: 0.1 %
Brady Statistic RA Percent Paced: 6.32 %
Brady Statistic RV Percent Paced: 99.85 %
Date Time Interrogation Session: 20200910040339
Implantable Lead Implant Date: 20170907
Implantable Lead Implant Date: 20170907
Implantable Lead Implant Date: 20170907
Implantable Lead Location: 753858
Implantable Lead Location: 753859
Implantable Lead Location: 753860
Implantable Lead Model: 4598
Implantable Lead Model: 5076
Implantable Lead Model: 5076
Implantable Pulse Generator Implant Date: 20170907
Lead Channel Impedance Value: 304 Ohm
Lead Channel Impedance Value: 304 Ohm
Lead Channel Impedance Value: 304 Ohm
Lead Channel Impedance Value: 323 Ohm
Lead Channel Impedance Value: 361 Ohm
Lead Channel Impedance Value: 361 Ohm
Lead Channel Impedance Value: 399 Ohm
Lead Channel Impedance Value: 399 Ohm
Lead Channel Impedance Value: 418 Ohm
Lead Channel Impedance Value: 532 Ohm
Lead Channel Impedance Value: 551 Ohm
Lead Channel Impedance Value: 608 Ohm
Lead Channel Impedance Value: 627 Ohm
Lead Channel Impedance Value: 646 Ohm
Lead Channel Pacing Threshold Amplitude: 0.75 V
Lead Channel Pacing Threshold Amplitude: 1.125 V
Lead Channel Pacing Threshold Amplitude: 1.5 V
Lead Channel Pacing Threshold Pulse Width: 0.4 ms
Lead Channel Pacing Threshold Pulse Width: 0.4 ms
Lead Channel Pacing Threshold Pulse Width: 0.8 ms
Lead Channel Sensing Intrinsic Amplitude: 1.25 mV
Lead Channel Sensing Intrinsic Amplitude: 1.25 mV
Lead Channel Sensing Intrinsic Amplitude: 9.25 mV
Lead Channel Sensing Intrinsic Amplitude: 9.25 mV
Lead Channel Setting Pacing Amplitude: 2 V
Lead Channel Setting Pacing Amplitude: 2.5 V
Lead Channel Setting Pacing Amplitude: 2.5 V
Lead Channel Setting Pacing Pulse Width: 0.4 ms
Lead Channel Setting Pacing Pulse Width: 0.8 ms
Lead Channel Setting Sensing Sensitivity: 0.9 mV

## 2019-05-02 ENCOUNTER — Ambulatory Visit (HOSPITAL_COMMUNITY): Payer: Medicare HMO | Admitting: Certified Registered Nurse Anesthetist

## 2019-05-02 ENCOUNTER — Ambulatory Visit (HOSPITAL_COMMUNITY)
Admission: RE | Admit: 2019-05-02 | Discharge: 2019-05-02 | Disposition: A | Discharge status: Home / Self Care | Payer: Medicare HMO | Attending: Cardiology | Admitting: Cardiology

## 2019-05-02 ENCOUNTER — Other Ambulatory Visit: Payer: Self-pay

## 2019-05-02 ENCOUNTER — Encounter (HOSPITAL_COMMUNITY)
Admission: RE | Disposition: A | Discharge status: Home / Self Care | Payer: Self-pay | Source: Home / Self Care | Attending: Cardiology

## 2019-05-02 ENCOUNTER — Encounter (HOSPITAL_COMMUNITY): Payer: Self-pay | Admitting: Certified Registered Nurse Anesthetist

## 2019-05-02 ENCOUNTER — Telehealth: Payer: Medicare HMO | Admitting: Physician Assistant

## 2019-05-02 DIAGNOSIS — I110 Hypertensive heart disease with heart failure: Secondary | ICD-10-CM | POA: Diagnosis not present

## 2019-05-02 DIAGNOSIS — Z79890 Hormone replacement therapy (postmenopausal): Secondary | ICD-10-CM | POA: Diagnosis not present

## 2019-05-02 DIAGNOSIS — N400 Enlarged prostate without lower urinary tract symptoms: Secondary | ICD-10-CM | POA: Insufficient documentation

## 2019-05-02 DIAGNOSIS — I4819 Other persistent atrial fibrillation: Secondary | ICD-10-CM | POA: Insufficient documentation

## 2019-05-02 DIAGNOSIS — I5042 Chronic combined systolic (congestive) and diastolic (congestive) heart failure: Secondary | ICD-10-CM | POA: Insufficient documentation

## 2019-05-02 DIAGNOSIS — K219 Gastro-esophageal reflux disease without esophagitis: Secondary | ICD-10-CM | POA: Diagnosis not present

## 2019-05-02 DIAGNOSIS — M1990 Unspecified osteoarthritis, unspecified site: Secondary | ICD-10-CM | POA: Diagnosis not present

## 2019-05-02 DIAGNOSIS — I252 Old myocardial infarction: Secondary | ICD-10-CM | POA: Insufficient documentation

## 2019-05-02 DIAGNOSIS — I2510 Atherosclerotic heart disease of native coronary artery without angina pectoris: Secondary | ICD-10-CM | POA: Diagnosis not present

## 2019-05-02 DIAGNOSIS — Z7901 Long term (current) use of anticoagulants: Secondary | ICD-10-CM | POA: Insufficient documentation

## 2019-05-02 DIAGNOSIS — Z955 Presence of coronary angioplasty implant and graft: Secondary | ICD-10-CM | POA: Insufficient documentation

## 2019-05-02 DIAGNOSIS — Z79899 Other long term (current) drug therapy: Secondary | ICD-10-CM | POA: Diagnosis not present

## 2019-05-02 DIAGNOSIS — E785 Hyperlipidemia, unspecified: Secondary | ICD-10-CM | POA: Diagnosis not present

## 2019-05-02 DIAGNOSIS — N183 Chronic kidney disease, stage 3 (moderate): Secondary | ICD-10-CM | POA: Diagnosis not present

## 2019-05-02 DIAGNOSIS — I442 Atrioventricular block, complete: Secondary | ICD-10-CM | POA: Diagnosis not present

## 2019-05-02 DIAGNOSIS — Z8249 Family history of ischemic heart disease and other diseases of the circulatory system: Secondary | ICD-10-CM | POA: Diagnosis not present

## 2019-05-02 DIAGNOSIS — I350 Nonrheumatic aortic (valve) stenosis: Secondary | ICD-10-CM | POA: Diagnosis not present

## 2019-05-02 DIAGNOSIS — Z882 Allergy status to sulfonamides status: Secondary | ICD-10-CM | POA: Insufficient documentation

## 2019-05-02 DIAGNOSIS — Z951 Presence of aortocoronary bypass graft: Secondary | ICD-10-CM | POA: Diagnosis not present

## 2019-05-02 DIAGNOSIS — I130 Hypertensive heart and chronic kidney disease with heart failure and stage 1 through stage 4 chronic kidney disease, or unspecified chronic kidney disease: Secondary | ICD-10-CM | POA: Insufficient documentation

## 2019-05-02 DIAGNOSIS — E039 Hypothyroidism, unspecified: Secondary | ICD-10-CM | POA: Insufficient documentation

## 2019-05-02 HISTORY — PX: CARDIOVERSION: SHX1299

## 2019-05-02 SURGERY — CARDIOVERSION
Anesthesia: General

## 2019-05-02 MED ORDER — LACTATED RINGERS IV SOLN: INTRAVENOUS | Status: DC | PRN

## 2019-05-02 MED ORDER — LIDOCAINE 2% (20 MG/ML) 5 ML SYRINGE
INTRAMUSCULAR | Status: DC | PRN
  Administered 2019-05-02: 40 mg via INTRAVENOUS

## 2019-05-02 MED ORDER — PROPOFOL 10 MG/ML IV BOLUS
INTRAVENOUS | Status: DC | PRN
  Administered 2019-05-02: 60 mg via INTRAVENOUS

## 2019-05-02 MED ORDER — SODIUM CHLORIDE 0.9 % IV SOLN
INTRAVENOUS | Status: DC | PRN
  Administered 2019-05-02: 12:00:00 via INTRAVENOUS

## 2019-05-02 NOTE — Interval H&P Note (Signed)
History and Physical Interval Note:  05/02/2019 11:45 AM  [name].  has presented today for surgery, with the diagnosis of Afib.  The various methods of treatment have been discussed with the patient and family. After consideration of risks, benefits and other options for treatment, the patient has consented to  Procedure(s): CARDIOVERSION (N/A) as a surgical intervention.  The patient's history has been reviewed, patient examined, no change in status, stable for surgery.  I have reviewed the patient's chart and labs.  Questions were answered to the patient's satisfaction.     Kirk Ruths

## 2019-05-02 NOTE — Transfer of Care (Signed)
Immediate Anesthesia Transfer of Care Note  Patient: [name].  Procedure(s) Performed: CARDIOVERSION (N/A )  Patient Location: Endoscopy Unit  Anesthesia Type:General  Level of Consciousness: awake, alert  and oriented  Airway & Oxygen Therapy: Patient Spontanous Breathing  Post-op Assessment: Report given to RN and Post -op Vital signs reviewed and stable  Post vital signs: Reviewed and stable  Last Vitals:  Vitals Value Taken Time  BP 100/50 05/02/19 1233  Temp 36.5 C 05/02/19 1231  Pulse 71 05/02/19 1233  Resp 13 05/02/19 1233  SpO2 92 % 05/02/19 1233    Last Pain:  Vitals:   05/02/19 1231  TempSrc: Temporal  PainSc: 0-No pain         Complications: No apparent anesthesia complications

## 2019-05-02 NOTE — Procedures (Addendum)
Electrical Cardioversion Procedure Note Ocean Steensma [id number] 1931/03/25  Procedure: Electrical Cardioversion Indications:  Atrial Fibrillation  Procedure Details Consent: Risks of procedure as well as the alternatives and risks of each were explained to the (patient/caregiver).  Consent for procedure obtained. Time Out: Verified patient identification, verified procedure, site/side was marked, verified correct patient position, special equipment/implants available, medications/allergies/relevent history reviewed, required imaging and test results available.  Performed  Patient placed on cardiac monitor, pulse oximetry, supplemental oxygen as necessary.  Sedation given: Pt sedated by anesthesia with lidocaine 40 mg and diprovan 60 mg IV. Pacer pads placed anterior and posterior chest.  Cardioverted 1 time(s).  Cardioverted at 120J.  Evaluation Findings: Post procedure EKG shows: Ventricular pacing with underlying atrial fibrillation. Complications: None Patient did tolerate procedure well.  Note Cr > 1.5; if continues to be in that range, will need to decrease apixaban to 2.5 mg BID.  Kirk Ruths 05/02/2019, 11:47 AM

## 2019-05-02 NOTE — Anesthesia Postprocedure Evaluation (Signed)
Anesthesia Post Note  Patient: [name].  Procedure(s) Performed: CARDIOVERSION (N/A )     Patient location during evaluation: Endoscopy Anesthesia Type: General Level of consciousness: awake Pain management: satisfactory to patient Respiratory status: spontaneous breathing Cardiovascular status: stable Postop Assessment: no apparent nausea or vomiting Anesthetic complications: no    Last Vitals:  Vitals:   05/02/19 1255 05/02/19 1305  BP: 114/62 126/63  Pulse: 69 70  Resp: 14 (!) 21  Temp:    SpO2: 97% 97%    Last Pain:  Vitals:   05/02/19 1300  TempSrc:   PainSc: 0-No pain                 Aradhya Shellenbarger

## 2019-05-02 NOTE — Discharge Instructions (Signed)

## 2019-05-02 NOTE — Anesthesia Preprocedure Evaluation (Addendum)
Anesthesia Evaluation  Patient identified by MRN, date of birth, ID band Patient awake    Reviewed: Allergy & Precautions, NPO status , Patient's Chart, lab work & pertinent test results  Airway Mallampati: II  TM Distance: >3 FB     Dental   Pulmonary    breath sounds clear to auscultation       Cardiovascular hypertension, + CAD and +CHF  + dysrhythmias Atrial Fibrillation  Rhythm:Regular Rate:Normal     Neuro/Psych    GI/Hepatic GERD  ,  Endo/Other  Hypothyroidism   Renal/GU Renal disease     Musculoskeletal   Abdominal   Peds  Hematology  (+) anemia ,   Anesthesia Other Findings   Reproductive/Obstetrics                             Anesthesia Physical Anesthesia Plan  ASA: III  Anesthesia Plan: General   Post-op Pain Management:    Induction: Intravenous  PONV Risk Score and Plan: 2 and Ondansetron, Dexamethasone and Midazolam  Airway Management Planned: Nasal Cannula and Simple Face Mask  Additional Equipment:   Intra-op Plan:   Post-operative Plan:   Informed Consent: I have reviewed the patients History and Physical, chart, labs and discussed the procedure including the risks, benefits and alternatives for the proposed anesthesia with the patient or authorized representative who has indicated his/her understanding and acceptance.     Dental advisory given  Plan Discussed with: CRNA and Anesthesiologist  Anesthesia Plan Comments:         Anesthesia Quick Evaluation

## 2019-05-02 NOTE — H&P (Signed)
Office Visit  03/10/2019 [facility] [address]   [place], [place], [facility]  Chronic combined systolic and diastolic CHF (congestive heart failure) ([place]) +4 more Dx  Follow-up   ; Referred by [name], MD Reason for Visit  Additional Documentation  Vitals:   BP 110/66  Pulse 88  Ht _0  (1.727 m)  Wt 80.7 kg  SpO2 90%  BMI 27.06 kg/m  BSA 1.97 m  Flowsheets:   MEWS Score,  Anthropometrics,  Method of Visit    Encounter Info:   Billing Info,  History,  Allergies,  Detailed Report    All Notes  Progress Notes by Liliane Shi, PA-C at 03/10/2019 10:45 AM Author: Liliane Shi, PA-C Author Type: Physician Assistant Filed: 03/10/2019 12:00 PM  Note Status: Signed Cosign: Cosign Not Required Encounter Date: 03/10/2019  Editor: [name] (Physician Assistant)  Expand All Collapse All    Cardiology Office Note:    Date:  03/10/2019   ID:  Daniel Soffer., DOB 02-22-1931, MRN [id number]  PCP:  [name], MD            Cardiologist:  [name], MD   Electrophysiologist:  [name], MD   Referring MD: [name], MD       Chief Complaint  Patient presents with  . Follow-up    CHF, CAD, AFib     History of Present Illness:    Daniel Swager. is a 85 y.o. male with:  Combined systolic and diastolic CHF EF 25-42  Coronary artery disease  ? S/p CABG in 2010 ? S/p multiple PCIs since CABG in 2010 ? S/p stent to the S-OM1 in 2017 ? S/p DES to the ostial and distal portions of the S-RPDA in 07/2017 ? S/p POBA to the [facility] in the setting of a NSTEMI ([place], [place]) in 3/19  Persistent Atrial fibrillation  ? Contributed to worsening CHF 8/19 >> improved in normal sinus rhythm  ? S/p DCCV in 03/2018  Aortic stenosis ? S/p TAVR 04/2016  Complete heart block ? S/p CRT-P ([employer] - MRI compatible)  Orthostatic Hypotension  [name] was last seen via Telemedicine 12/02/2018.  He was noted to be  back in AFib on his device interrogation.  Volume status was stable.  He then saw Dr. [name] for follow up via Telemedicine who recommended continued rate control.  AAD and DCCV could be considered after COVID-19 restrictions are lifted.  He returns for follow up.  He is here alone.  Since last seen, he has not had any chest pain.  He has fatigue and shortness of breath that seems to be worse at times and better at others.  His symptoms are stable.  His weights are stable.  He has not had syncope.  His leg swelling is stable.    Prior CV studies:   The following studies were reviewed today:  Cardiac catheterization 11/12/2017([facility], [place], [place]) LVEDP 16 EF 50% LM occluded RCA occluded SVG-RCA patent with mid stent with subtotal ISR SVG-OM patent stent LIMA-LAD patent PCI: POBA to the SVG-RCA  Echo 11/13/2017([facility], [place], [place]) EF 20-25, diffuse HK, mild MR, normally functioning aortic valve prosthesis, mild to moderate TR  Cardiac Catheterization12/20/18 LM ostial 100 RCA proximal 100-CTO SVG-RPDA ostial 90, distal 90 SVG-OM1 stent patent LIMA-LAD patent EF 40-45 Mean PCWP 20; LVEDP 22 PCI: 4 x 12 mm Resolute Onyx DES to the ostial/proximal SVG-RPDA  PCI: 3 x 22 mm Resolute Onyx DES to the distal SVG-RPDA Recommend: Continue clopidogrel without interruption, add aspirin 81 mg daily, diurese overnight with a single dose of IV Lasix and then continue oral furosemide. If no complications arise should be okay for discharge tomorrow.  Carotid US 05/14/17 L CEA patent; RICA 40-59  Echo 05/09/17 Inferior akinesis, mild LVH, EF 40, status post TAVR with no perivalvular regurgitation, MAC, mild MR, moderate LAE, mild RAE, PASP 36  Echo 06/22/16 Mild LVH, EF 40-45, diffuse HK, grade 2 diastolic dysfunction, status post TAVR with no significant stenosis or perivalvular regurgitation, MAC, trivial MR, mild LAE, mild RAE, PASP 39        Past Medical History:  Diagnosis Date  . Arthritis   . Benign prostatic hypertrophy   . CAD (coronary artery disease)    DES, SVG to circumflex marginal, October, 2010 ( SVG to PDA and PLA patent , LIMA to LAD patent, EF 60%, mild inferior hypokinesis 12/18 PCI/DES to SVG-PDA x2, EF 40% // NSTEMI 3/19 [facility]) >> S-OM and L-LAD ok; S-RCA stent sub-total ISR >> PCI:  POBA  . Chronic systolic CHF (congestive heart failure) ([place])    Echo 9/18: Inferior akinesis, mild LVH, EF 40, status post TAVR with no perivalvular regurgitation, MAC, mild MR, moderate LAE, mild RAE, PASP 36 // Echo 3/19: EF 20-25, mild MR, AVR ok, mild to mod TR // Echo 6/19: Mild LVH, EF 30-35, diffuse HK, inferolateral and inferior AK, status post TAVR, no stenosis, no AI (mean gradient 5), MAC, mild MR, severe LAE, normal RVSF, moderate TR, PASP 50   . Complete heart block ([place])    a. s/p MDT CRTP 04/2016 Dr [name]  . GERD (gastroesophageal reflux disease)   . History of kidney stones 1970   /notes 01/03/2011  . HTN (hypertension)   . Hyperlipidemia   . Hypothyroidism   . Neuromuscular disorder ([place])   . Peripheral neuropathy   . Persistent atrial fibrillation   . Psoriasis    severe; with total skin exfoliation in the past resolved  . S/P TAVR (transcatheter aortic valve replacement)    severe AS >> s/p TAVR 9/17 // Limited Echo 10/17: EF 40-45%, lat and inf-lat HK, TAVR ok, MAC, mild BAE, PASP 31 mmHg, no effusion  . Ventricular tachycardia, non-sustained (HCC)    Surgical Hx: The patient  has a past surgical history that includes decompression of left median nerve; Colonoscopy (11/23/2011); Tonsillectomy; Appendectomy; Total knee arthroplasty (04/16/2012); Cataract extraction, bilateral; Carpal tunnel release (Bilateral, 2009-2011); left heart catheterization with coronary angiogram (N/A, 12/27/2012); TEE without cardioversion (N/A, 04/21/2016); Cardiac catheterization (N/A, 04/27/2016);  Transcatheter aortic valve replacement, transfemoral (N/A, 05/16/2016); TEE without cardioversion (N/A, 05/16/2016); Cardiac catheterization (N/A, 11/17/2015); Cardiac catheterization (N/A, 11/17/2015); Cardiac catheterization (N/A, 04/19/2016); Coronary angioplasty with stent; Coronary angioplasty with stent (08/09/2017); Coronary angioplasty with stent (11/17/2015); Lumbar laminectomy (Bilateral, 09/2004); Carotid endarterectomy (Left, 02/2008); Synovial cyst excision (09/2004); Back surgery; Joint replacement; Lumbar disc surgery; Anterior cervical decomp/discectomy fusion; Kidney stone surgery; Coronary artery bypass graft (1995); RIGHT/LEFT HEART CATH AND CORONARY/GRAFT ANGIOGRAPHY (N/A, 08/09/2017); CORONARY STENT INTERVENTION (N/A, 08/09/2017); and Cardioversion (N/A, 03/27/2018).   Current Medications: Active Medications      Current Meds  Medication Sig  . allopurinol (ZYLOPRIM) 300 MG tablet Take 300 mg by mouth daily.  Marland Kitchen apixaban (ELIQUIS) 5 MG TABS tablet Take 1 tablet (5 mg total) by mouth 2 (two) times daily.  Marland Kitchen atorvastatin (LIPITOR) 20 MG tablet Take 20 mg by mouth every  evening.   . docusate sodium (COLACE) 100 MG capsule Take 100 mg by mouth daily as needed for moderate constipation.   . fluticasone (FLONASE) 50 MCG/ACT nasal spray Place 1 spray into both nostrils daily.   Marland Kitchen latanoprost (XALATAN) 0.005 % ophthalmic solution Place 1 drop into both eyes at bedtime.  Marland Kitchen levothyroxine (SYNTHROID, LEVOTHROID) 112 MCG tablet Take 112 mcg by mouth daily before breakfast.  . Magnesium 250 MG TABS Take 250 mg by mouth 2 (two) times daily.   . metoprolol tartrate (LOPRESSOR) 25 MG tablet Take 0.5 tablets (12.5 mg total) by mouth 2 (two) times daily.  . multivitamin (THERAGRAN) per tablet Take 1 tablet by mouth at bedtime.   . nitroGLYCERIN (NITROSTAT) 0.4 MG SL tablet Place 0.4 mg under the tongue every 5 (five) minutes x 3 doses as needed for chest pain.   . potassium chloride SA (KLOR-CON M20)  20 MEQ tablet TAKE 2 TABS IN THE AM AND 1 TAB IN THE PM  . pyridOXINE (VITAMIN B-6) 100 MG tablet Take 100 mg by mouth daily.   . Tamsulosin HCl (FLOMAX) 0.4 MG CAPS Take 0.4 mg by mouth daily.   Marland Kitchen torsemide (DEMADEX) 20 MG tablet Take 2 tabs in the AM and 1 tab in the PM  . triamcinolone cream (KENALOG) 0.1 % Apply 1 application topically. Pt is allergic to some of the ingredients but is able to use sparingly       Allergies:   Sulfamethoxazole, Other, and Propylene glycol   Social History        Tobacco Use  . Smoking status: Never Smoker  . Smokeless tobacco: Never Used  Substance Use Topics  . Alcohol use: Yes    Alcohol/week: 1.0 standard drinks    Types: 1 Cans of beer per week  . Drug use: No     Family Hx: The patient's family history includes Heart attack in his mother; Heart attack (age of onset: 97) in his son; Heart disease in his father and mother; Hyperlipidemia in his father; Hypertension in his father. There is no history of Colon cancer.  ROS:   Please see the history of present illness.    ROS All other systems reviewed and are negative.   EKGs/Labs/Other Test Reviewed:    EKG:  EKG is  ordered today.  The ekg ordered today demonstrates V paced, HR 88, probable underlying AFlutter  Recent Labs: 12/25/2018: ALT 22; BUN 35; Creatinine, Ser 1.32; Hemoglobin 10.8; Platelets 123; Potassium 4.0; Sodium 140   Recent Lipid Panel Labs (Brief)       Lab Results  Component Value Date/Time   CHOL 143 08/06/2006 08:14 AM   TRIG 141 08/06/2006 08:14 AM   HDL 29.7 (L) 08/06/2006 08:14 AM   CHOLHDL 4.8 CALC 08/06/2006 08:14 AM   LDLCALC 85 08/06/2006 08:14 AM      Physical Exam:    VS:  BP 110/66   Pulse 88   Ht _0  (1.727 m)   Wt 178 lb (80.7 kg)   SpO2 90%   BMI 27.06 kg/m        Wt Readings from Last 3 Encounters:  03/10/19 178 lb (80.7 kg)  12/02/18 171 lb (77.6 kg)  08/05/18 181 lb 11.2 oz (82.4 kg)     Physical Exam   Constitutional: He is oriented to person, place, and time. He appears well-developed and well-nourished. No distress.  HENT:  Head: Normocephalic and atraumatic.  Eyes: No scleral icterus.  Neck: No JVD  present. No thyromegaly present.  Cardiovascular: Normal rate and regular rhythm.  Murmur heard.  Holosystolic murmur is present with a grade of 2/6 at the lower left sternal border. Pulmonary/Chest: Effort normal and breath sounds normal. He has no rales.  Abdominal: Soft. There is no hepatomegaly.  Musculoskeletal:        General: Edema (trace bilat LE edema) present.  Lymphadenopathy:    He has no cervical adenopathy.  Neurological: He is alert and oriented to person, place, and time.  Skin: Skin is warm and dry.  Psychiatric: He has a normal mood and affect.    ASSESSMENT & PLAN:    1. Chronic combined systolic and diastolic CHF (congestive heart failure) (HCC) EF 20-25.  NYHA 2b-3a.  His volume status seems stable.  His most recent thoracic impedance in June 2020 was normal.  BP has limited titration of meds for CHF in the past.  Continue beta-blocker, torsemide.  FU with me or Dr. Burt Knack in 3 mos.   2. Persistent atrial fibrillation He is probably symptomatic with fatigue and occasional shortness of breath.  His volume appears stable.  He had issues with worsening CHF in the past with AFib.  At his last visit with Dr. [name], the plan was to hold off on any rhythm control measures until COVID-19 restrictions were lifted.  I will review with Dr. [name] again to see if we should consider Amiodarone +/- DCCV now that we are doing routine procedures again.  Continue Apixaban.  Creatinine < 1.5 and weight > 60 kg.  Continue Apixaban 5 mg twice daily.   3. Coronary artery disease involving coronary bypass graft of native heart without angina pectoris Prior CABG and subsequent PCI of the SVG-OM1, DES x2 to the SVG-RCA and balloon angioplasty of the SVG-RCA.He is not on ASA as he is  on Apixaban.  Continue statin Rx.   4. Severe Aortic Stenosis S/P TAVR (transcatheter aortic valve replacement) Normal function by Echocardiogram in 2019.  Continue SBE prophylaxis.    5. CKD (chronic kidney disease) stage 3, GFR 30-59 ml/min (HCC) Recent creatinine stable.     Dispo:  Return in about 3 months (around 06/10/2019) for Routine Follow Up, w/ Dr. Burt Knack, or [name], PA-C.   Medication Adjustments/Labs and Tests Ordered: Current medicines are reviewed at length with the patient today.  Concerns regarding medicines are outlined above.  Tests Ordered:    Orders Placed This Encounter  Procedures  . EKG 12-Lead   Medication Changes: No orders of the defined types were placed in this encounter.   Signed, [name], PA-C  03/10/2019 11:59 AM    [facility] [place], [place], [place]  [address] Phone: [phone]; Fax: [phone]      For DCCV; no changes; compliant with apixaban. Kirk Ruths

## 2019-05-04 ENCOUNTER — Encounter (HOSPITAL_COMMUNITY): Payer: Self-pay | Admitting: Cardiology

## 2019-05-07 ENCOUNTER — Ambulatory Visit (HOSPITAL_COMMUNITY): Payer: Medicare HMO

## 2019-05-15 NOTE — Addendum Note (Signed)
Addended by: Douglass Rivers D on: 05/15/2019 11:09 AM   Modules accepted: Level of Service

## 2019-05-15 NOTE — Progress Notes (Signed)
Remote pacemaker transmission.   

## 2019-05-16 ENCOUNTER — Encounter (HOSPITAL_COMMUNITY): Payer: Self-pay

## 2019-05-16 ENCOUNTER — Other Ambulatory Visit: Payer: Self-pay

## 2019-05-16 ENCOUNTER — Inpatient Hospital Stay (HOSPITAL_COMMUNITY): Payer: Medicare HMO | Attending: Hematology

## 2019-05-16 VITALS — BP 110/63 | HR 66 | Temp 97.6°F | Resp 18

## 2019-05-16 DIAGNOSIS — D649 Anemia, unspecified: Secondary | ICD-10-CM | POA: Diagnosis not present

## 2019-05-16 MED ORDER — SODIUM CHLORIDE 0.9 % IV SOLN
Freq: Once | INTRAVENOUS | Status: AC
  Administered 2019-05-16: 13:00:00 via INTRAVENOUS

## 2019-05-16 MED ORDER — SODIUM CHLORIDE 0.9 % IV SOLN
200.0000 mg | Freq: Once | INTRAVENOUS | Status: AC
  Administered 2019-05-16: 13:00:00 200 mg via INTRAVENOUS
  Filled 2019-05-16: qty 10

## 2019-05-16 MED ORDER — SODIUM CHLORIDE 0.9% FLUSH
10.0000 mL | Freq: Once | INTRAVENOUS | Status: AC | PRN
  Administered 2019-05-16: 10 mL

## 2019-05-16 NOTE — Progress Notes (Signed)
Patient tolerated iron infusion with no complaints voiced.  Peripheral IV site clean and dry with good blood return noted before and after infusion.  Band aid applied.  VSS with discharge and left ambulatory with no s/s of distress noted.  

## 2019-05-30 ENCOUNTER — Other Ambulatory Visit: Payer: Self-pay

## 2019-05-30 ENCOUNTER — Encounter (HOSPITAL_COMMUNITY): Payer: Self-pay | Admitting: Physician Assistant

## 2019-05-30 ENCOUNTER — Ambulatory Visit (HOSPITAL_COMMUNITY)
Admission: RE | Admit: 2019-05-30 | Discharge: 2019-05-30 | Disposition: A | Discharge status: Home / Self Care | Payer: Medicare HMO | Source: Ambulatory Visit | Attending: Physician Assistant | Admitting: Physician Assistant

## 2019-05-30 VITALS — BP 122/58 | HR 70 | Ht 68.0 in | Wt 196.8 lb

## 2019-05-30 DIAGNOSIS — I2510 Atherosclerotic heart disease of native coronary artery without angina pectoris: Secondary | ICD-10-CM | POA: Diagnosis not present

## 2019-05-30 DIAGNOSIS — I5042 Chronic combined systolic (congestive) and diastolic (congestive) heart failure: Secondary | ICD-10-CM | POA: Insufficient documentation

## 2019-05-30 DIAGNOSIS — E669 Obesity, unspecified: Secondary | ICD-10-CM | POA: Diagnosis not present

## 2019-05-30 DIAGNOSIS — Z951 Presence of aortocoronary bypass graft: Secondary | ICD-10-CM | POA: Insufficient documentation

## 2019-05-30 DIAGNOSIS — Z8249 Family history of ischemic heart disease and other diseases of the circulatory system: Secondary | ICD-10-CM | POA: Insufficient documentation

## 2019-05-30 DIAGNOSIS — I252 Old myocardial infarction: Secondary | ICD-10-CM | POA: Diagnosis not present

## 2019-05-30 DIAGNOSIS — Z888 Allergy status to other drugs, medicaments and biological substances status: Secondary | ICD-10-CM | POA: Insufficient documentation

## 2019-05-30 DIAGNOSIS — Z955 Presence of coronary angioplasty implant and graft: Secondary | ICD-10-CM | POA: Diagnosis not present

## 2019-05-30 DIAGNOSIS — Z882 Allergy status to sulfonamides status: Secondary | ICD-10-CM | POA: Diagnosis not present

## 2019-05-30 DIAGNOSIS — N400 Enlarged prostate without lower urinary tract symptoms: Secondary | ICD-10-CM | POA: Insufficient documentation

## 2019-05-30 DIAGNOSIS — G629 Polyneuropathy, unspecified: Secondary | ICD-10-CM | POA: Insufficient documentation

## 2019-05-30 DIAGNOSIS — I4819 Other persistent atrial fibrillation: Secondary | ICD-10-CM | POA: Insufficient documentation

## 2019-05-30 DIAGNOSIS — E039 Hypothyroidism, unspecified: Secondary | ICD-10-CM | POA: Diagnosis not present

## 2019-05-30 DIAGNOSIS — K219 Gastro-esophageal reflux disease without esophagitis: Secondary | ICD-10-CM | POA: Diagnosis not present

## 2019-05-30 DIAGNOSIS — I110 Hypertensive heart disease with heart failure: Secondary | ICD-10-CM | POA: Insufficient documentation

## 2019-05-30 DIAGNOSIS — Z7901 Long term (current) use of anticoagulants: Secondary | ICD-10-CM | POA: Insufficient documentation

## 2019-05-30 DIAGNOSIS — E785 Hyperlipidemia, unspecified: Secondary | ICD-10-CM | POA: Insufficient documentation

## 2019-05-30 DIAGNOSIS — Z79899 Other long term (current) drug therapy: Secondary | ICD-10-CM | POA: Insufficient documentation

## 2019-05-30 DIAGNOSIS — Z6829 Body mass index (BMI) 29.0-29.9, adult: Secondary | ICD-10-CM | POA: Diagnosis not present

## 2019-05-30 DIAGNOSIS — Z79890 Hormone replacement therapy (postmenopausal): Secondary | ICD-10-CM | POA: Insufficient documentation
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
## 2019-06-04 ENCOUNTER — Telehealth: Payer: Self-pay | Admitting: *Deleted

## 2019-06-04 NOTE — Telephone Encounter (Signed)
Follow up:     Patient returning a call back. Please call patient back 

## 2019-06-04 NOTE — Telephone Encounter (Signed)
Pt wanted to be seen in office,  R/s to oct 27.

## 2019-06-04 NOTE — Telephone Encounter (Signed)
lvm to call office back to see if pt can do a VT visit.   Pt has an upcoming appt on Oct 23 with [name], this is a VT day for [place]. Advised with Nicki Reaper pt can do a VT visit.  Pt is getting device checked two days prior which Scott wanted prior to VT.

## 2019-06-11 ENCOUNTER — Ambulatory Visit (INDEPENDENT_AMBULATORY_CARE_PROVIDER_SITE_OTHER): Payer: Medicare HMO

## 2019-06-11 DIAGNOSIS — Z950 Presence of cardiac pacemaker: Secondary | ICD-10-CM | POA: Diagnosis not present

## 2019-06-11 DIAGNOSIS — I5042 Chronic combined systolic (congestive) and diastolic (congestive) heart failure: Secondary | ICD-10-CM | POA: Diagnosis not present

## 2019-06-11 DIAGNOSIS — R69 Illness, unspecified: Secondary | ICD-10-CM | POA: Diagnosis not present

## 2019-06-11 NOTE — Progress Notes (Signed)
EPIC Encounter for ICM Monitoring  Patient Name: [name]. is a 85 y.o. male Date: 06/11/2019 Primary Care Physican: [name], MD Primary Cardiologist:Cooper/Weaver, PA Electrophysiologist:Allred Bi-V Pacing:99.7 Last Weight: 172-174 lbs  Since 02-May-2019 Time in AT/AF     <0.1 hr/day (<0.1%)  Spoke with patient. He passed out on 10/10 between 11:00 PM and midnight while he was standing up in the bathroom.  His wife told him he passed out for about 3 minutes and then took another 2 minutes to get him talking. He felt fine after the episode and has not had any since then.  He still remains SOB when walking short distances and still feels weak even after having cardioversion done.  He does think the cardioversion has helped and he can tell he is back in NSR.      Optivolthoracic impedancenormal.Device clinic to review 10/21 report for any abnormalities.  Prescribed:Torsemide20 mg take 2 tabs (40 mg total) every morning and 1 tablet (20 mg) every evening. Potassium 20 mEq take 2 tabs (40 mEq total) every morning and 1 tab (20 mEq total) every evening.   Labs: 04/29/2019 Creatinine 1.91, BUN 37, Potassium 4.2, Sodium 137, GFR 31-36  04/01/2019 Creatinine 1.48, BUN 43, Potassium 4.1, Sodium 139, GFR 42-49 12/25/2018 Creatinine 1.32, BUN 35, Potassium 4.0, Sodium 140, GFR 49-57 10/17/2018 Creatinine 1.48, BUN 31, Potassium 4.3, Sodium 140, GFR 43-49 10/07/2018 Creatinine 1.49, BUN 30, Potassium 4.4, Sodium 141, GFR 42-49  A complete set of results can be found in results review.  Recommendations: No changes and encouraged to call if experiencing any fluid symptoms.  Advised to call if he has any further episodes of passing out.   Follow-up plan: ICM clinic phone appointment on 07/14/2019.   91 day device clinic remote transmission 06/17/2019.  Office appt 06/17/2019 with [name], PA.    Copy of ICM check sent to Dr. [name].   AT/AF   3 month ICM trend:  06/11/2019    1 Year ICM trend:       Rosalene Billings, RN 06/11/2019 2:54 PM

## 2019-06-12 NOTE — Progress Notes (Signed)
Reviewed notes from AFib Clinic.  Syncope was evaluated at that visit.  It looks like it was felt the episode was related to postural hypotension.  He has a hx of this.    Agree with device transmission interrogation to r/o ventricular arrhythmias.  He is on Flomax which can increase postural hypotension.  He should use great caution when getting up in the night to go to the bathroom.  He should sit on the edge of the bed for several seconds, pump his calf muscles and have something to hold on to while he is up and on his feet (a walker if he has one).    Have him decrease his Metoprolol to once daily in the AM to see if this helps cut back on BP drops at night when he gets up.  Since his fluid level is optimal, continue current dose of diuretic.  At this point, I do not think that Amiodarone is a cause of his shortness of breath.  Dyspnea is likely multifactorial.  We can evaluate further at his future appointment with me.  [name], PA-C    06/12/2019 9:26 AM

## 2019-06-13 ENCOUNTER — Ambulatory Visit: Payer: Medicare HMO | Admitting: Physician Assistant

## 2019-06-13 MED ORDER — METOPROLOL TARTRATE 25 MG PO TABS: ORAL_TABLET | ORAL | 3 refills | Status: AC

## 2019-06-13 NOTE — Addendum Note (Signed)
Addended by: Rosalene Billings on: 06/13/2019 01:13 PM   Modules accepted: Orders

## 2019-06-13 NOTE — Progress Notes (Signed)
Received: 2 days ago Message Contents  Mechele Dawley, RN  Emmanuelle Coxe Panda, RN        No arrhythmias since 9/11, device is functioning normally. Looks like pt may benefit from turning rate response on as his histograms are blunted, but this should not cause syncope. Looks like he has a history of postural syncope per Ricky's note on 10/9. Let me know if you need any additional information.   Thanks!  Raquel Sarna

## 2019-06-13 NOTE — Progress Notes (Signed)
Call to patient.  Advised Daniel Dopp, PA recommended the following.  1. Decrease Metoprolol 25 mg to 0.5 tablet (12.5 mg) once daily every morning.  He should stop the evening dose. 2.  He should use great caution when getting up in the night to go to the bathroom.  He should sit on the edge of the bed for several seconds, pump his calf muscles and have something to hold on to while he is up and on his feet (a walker if he has one).    Advised to monitor BP and bring recordings to 10/27 OV for Scott to review.    He verbalized understanding of instructions and repeated back correctly.   He requested to bring his wife to the appointment so that she can be there to hear his plan of care.

## 2019-06-16 DIAGNOSIS — D649 Anemia, unspecified: Secondary | ICD-10-CM | POA: Diagnosis not present

## 2019-06-16 DIAGNOSIS — I482 Chronic atrial fibrillation, unspecified: Secondary | ICD-10-CM | POA: Diagnosis not present

## 2019-06-16 DIAGNOSIS — R55 Syncope and collapse: Secondary | ICD-10-CM | POA: Diagnosis not present

## 2019-06-17 ENCOUNTER — Encounter: Payer: Self-pay | Admitting: Physician Assistant

## 2019-06-17 ENCOUNTER — Other Ambulatory Visit: Payer: Self-pay

## 2019-06-17 ENCOUNTER — Encounter: Payer: Medicare HMO | Admitting: *Deleted

## 2019-06-17 ENCOUNTER — Ambulatory Visit: Payer: Medicare HMO | Admitting: Physician Assistant

## 2019-06-17 ENCOUNTER — Ambulatory Visit: Payer: Medicare HMO

## 2019-06-17 VITALS — BP 124/62 | HR 70 | Ht 68.0 in | Wt 180.0 lb

## 2019-06-17 DIAGNOSIS — D6869 Other thrombophilia: Secondary | ICD-10-CM | POA: Diagnosis not present

## 2019-06-17 DIAGNOSIS — R55 Syncope and collapse: Secondary | ICD-10-CM

## 2019-06-17 DIAGNOSIS — I350 Nonrheumatic aortic (valve) stenosis: Secondary | ICD-10-CM

## 2019-06-17 DIAGNOSIS — I25810 Atherosclerosis of coronary artery bypass graft(s) without angina pectoris: Secondary | ICD-10-CM

## 2019-06-17 DIAGNOSIS — S51811A Laceration without foreign body of right forearm, initial encounter: Secondary | ICD-10-CM | POA: Diagnosis not present

## 2019-06-17 DIAGNOSIS — I4819 Other persistent atrial fibrillation: Secondary | ICD-10-CM | POA: Diagnosis not present

## 2019-06-17 DIAGNOSIS — S41111A Laceration without foreign body of right upper arm, initial encounter: Secondary | ICD-10-CM

## 2019-06-17 DIAGNOSIS — R0602 Shortness of breath: Secondary | ICD-10-CM

## 2019-06-17 DIAGNOSIS — Z952 Presence of prosthetic heart valve: Secondary | ICD-10-CM

## 2019-06-17 DIAGNOSIS — N1832 Chronic kidney disease, stage 3b: Secondary | ICD-10-CM | POA: Diagnosis not present

## 2019-06-17 DIAGNOSIS — I5042 Chronic combined systolic (congestive) and diastolic (congestive) heart failure: Secondary | ICD-10-CM | POA: Diagnosis not present

## 2019-06-17 DIAGNOSIS — Z79899 Other long term (current) drug therapy: Secondary | ICD-10-CM | POA: Diagnosis not present

## 2019-06-17 LAB — CUP PACEART REMOTE DEVICE CHECK
Battery Remaining Longevity: 49 mo
Battery Voltage: 2.95 V
Brady Statistic AP VP Percent: 98.56 %
Brady Statistic AP VS Percent: 0.21 %
Brady Statistic AS VP Percent: 1.05 %
Brady Statistic AS VS Percent: 0.18 %
Brady Statistic RA Percent Paced: 98.91 %
Brady Statistic RV Percent Paced: 99.6 %
Date Time Interrogation Session: 20201027063721
Implantable Lead Implant Date: 20170907
Implantable Lead Implant Date: 20170907
Implantable Lead Implant Date: 20170907
Implantable Lead Location: 753858
Implantable Lead Location: 753859
Implantable Lead Location: 753860
Implantable Lead Model: 4598
Implantable Lead Model: 5076
Implantable Lead Model: 5076
Implantable Pulse Generator Implant Date: 20170907
Lead Channel Impedance Value: 285 Ohm
Lead Channel Impedance Value: 285 Ohm
Lead Channel Impedance Value: 304 Ohm
Lead Channel Impedance Value: 304 Ohm
Lead Channel Impedance Value: 323 Ohm
Lead Channel Impedance Value: 342 Ohm
Lead Channel Impedance Value: 361 Ohm
Lead Channel Impedance Value: 380 Ohm
Lead Channel Impedance Value: 399 Ohm
Lead Channel Impedance Value: 494 Ohm
Lead Channel Impedance Value: 513 Ohm
Lead Channel Impedance Value: 589 Ohm
Lead Channel Impedance Value: 589 Ohm
Lead Channel Impedance Value: 608 Ohm
Lead Channel Pacing Threshold Amplitude: 0.75 V
Lead Channel Pacing Threshold Amplitude: 1.375 V
Lead Channel Pacing Threshold Amplitude: 1.5 V
Lead Channel Pacing Threshold Pulse Width: 0.4 ms
Lead Channel Pacing Threshold Pulse Width: 0.4 ms
Lead Channel Pacing Threshold Pulse Width: 0.8 ms
Lead Channel Sensing Intrinsic Amplitude: 1.875 mV
Lead Channel Sensing Intrinsic Amplitude: 1.875 mV
Lead Channel Sensing Intrinsic Amplitude: 9.125 mV
Lead Channel Sensing Intrinsic Amplitude: 9.125 mV
Lead Channel Setting Pacing Amplitude: 2 V
Lead Channel Setting Pacing Amplitude: 2.5 V
Lead Channel Setting Pacing Amplitude: 3 V
Lead Channel Setting Pacing Pulse Width: 0.4 ms
Lead Channel Setting Pacing Pulse Width: 0.8 ms
Lead Channel Setting Sensing Sensitivity: 0.9 mV

## 2019-06-17 NOTE — Patient Instructions (Addendum)
Medication Instructions:  1) HOLD your Eliquis tonight and tomorrow.  Restart it on Thursday.  *If you need a refill on your cardiac medications before your next appointment, please call your pharmacy*  Lab Work: BMET, Liver, CBC, TSH and Pro BNP today  If you have labs (blood work) drawn today and your tests are completely normal, you will receive your results only by: Marland Kitchen MyChart Message (if you have MyChart) OR . A paper copy in the mail If you have any lab test that is abnormal or we need to change your treatment, we will call you to review the results.  Testing/Procedures: None  Follow-Up: At [facility], you and your health needs are our priority.  As part of our continuing mission to provide you with exceptional heart care, we have created designated Provider Care Teams.  These Care Teams include your primary Cardiologist (physician) and Advanced Practice Providers (APPs -  Physician Assistants and Nurse Practitioners) who all work together to provide you with the care you need, when you need it.  Your next appointment:   3-4 weeks  The format for your next appointment:   In Person  Provider:   You may see [name], MD or one of the following Advanced Practice Providers on your designated Care Team:    [name], PA-C  [place], [place]  Daune Perch, NP   Other Instructions

## 2019-06-17 NOTE — Progress Notes (Signed)
Cardiology Office Note:    Date:  06/17/2019   ID:  Daniel Dandy., DOB 07-Jan-1931, MRN [id number]  PCP:  [name], MD  Cardiologist:  [name], MD   Electrophysiologist:  [name], MD   Referring MD: [name], MD   Chief Complaint  Patient presents with  . Follow-up    CHF, CAD, AFib    History of Present Illness:    [name]. is a 85 y.o. male with:   Combined systolic and diastolic CHF EF 72-53  Coronary artery disease  ? S/p CABG in 2010 ? S/p multiple PCIs since CABG in 2010 ? S/p stent to the S-OM1 in 2017 ? S/p DES to the ostial and distal portions of the S-RPDA in 07/2017 ? S/p POBA to the [facility] in the setting of a NSTEMI ([place], [place]) in 3/19  Persistent Atrial fibrillation  ? Contributed to worsening CHF 8/19 >> improved in normal sinus rhythm  ? S/p DCCV in 03/2018 ? Amiodarone started 02/2019; s/p DCCV 04/2019 >> NSR  Aortic stenosis ? S/p TAVR 04/2016  Complete heart block ? S/p CRT-P ([employer] - MRI compatible)  Orthostatic Hypotension  [name] was noted to be in recurrent atrial fibrillation in July 2020.  He was placed on amiodarone and ultimately underwent cardioversion.  He was last seen in the atrial fibrillation clinic 05/30/2019.  He was maintaining normal sinus rhythm.  Of note, at that appointment, the patient reported a recent syncopal event.  This occurred with postural change.  He has a history of orthostatic hypotension in the past.  Recent ICM clinic device interrogation demonstrated normal fluid levels.  No arrhythmias were noted.  There was a mention of consideration for turning on rate response.  I recommended reducing his metoprolol to once daily and to use caution with standing as he is on chronic Rx with Flomax.    He returns for follow up.  He can tell that he is back in normal sinus rhythm.  However, he still feels short of breath with minimal activity.  He has not had orthopnea, paroxysmal nocturnal  dyspnea.  His weight has been stable.  He does have leg swelling and his wife notes it has been somewhat worse in the past 3 weeks.  He has not had further syncope.  He feels that cutting back on the metoprolol has helped.  He did fall recently and injured his R arm.  This caused a skin tear on his forearm and he put some coagulant on it (OTC).  His wound is bandaged but did bleed during his visit today.  One of the device RNs came down to make some changes on his device and helped with re-dressing his wound.    Prior CV studies:   The following studies were reviewed today:  Carotid US 04/14/2019 Summary: Right Carotid: Velocities in the right ICA are consistent with a 1-39% stenosis.                Non-hemodynamically significant plaque <50% noted in the CCA. The                ECA appears <50% stenosed.  Left Carotid: Velocities in the left ICA are consistent with a 1-39% stenosis.               Non-hemodynamically significant plaque <50% noted in the CCA. The               ECA appears <50% stenosed.  Patent carotid endarterectomy with               non-hemodynamically signigicant plaque noted.  Vertebrals:  Bilateral vertebral arteries demonstrate antegrade flow. Subclavians: Normal flow hemodynamics were seen in bilateral subclavian              arteries.  Cardiac catheterization 11/12/2017([facility], [place], [place]) LVEDP 16 EF 50% LM occluded RCA occluded SVG-RCA patent with mid stent with subtotal ISR SVG-OM patent stent LIMA-LAD patent PCI: POBA to the SVG-RCA  Echo 11/13/2017([facility], [place], [place]) EF 20-25, diffuse HK, mild MR, normally functioning aortic valve prosthesis, mild to moderate TR  Cardiac Catheterization12/20/18 LM ostial 100 RCA proximal 100-CTO SVG-RPDA ostial 90, distal 90 SVG-OM1 stent patent LIMA-LAD patent EF 40-45 Mean PCWP 20; LVEDP 22 PCI: 4 x 12 mm Resolute Onyx DES to the ostial/proximal SVG-RPDA   PCI: 3 x 22 mm Resolute Onyx DES to the distal SVG-RPDA Recommend: Continue clopidogrel without interruption, add aspirin 81 mg daily, diurese overnight with a single dose of IV Lasix and then continue oral furosemide. If no complications arise should be okay for discharge tomorrow.  Carotid US 05/14/17 L CEA patent; RICA 40-59  Echo 05/09/17 Inferior akinesis, mild LVH, EF 40, status post TAVR with no perivalvular regurgitation, MAC, mild MR, moderate LAE, mild RAE, PASP 36  Echo 06/22/16 Mild LVH, EF 40-45, diffuse HK, grade 2 diastolic dysfunction, status post TAVR with no significant stenosis or perivalvular regurgitation, MAC, trivial MR, mild LAE, mild RAE, PASP 39  Past Medical History:  Diagnosis Date  . Arthritis   . Benign prostatic hypertrophy   . CAD (coronary artery disease)    DES, SVG to circumflex marginal, October, 2010 ( SVG to PDA and PLA patent , LIMA to LAD patent, EF 60%, mild inferior hypokinesis 12/18 PCI/DES to SVG-PDA x2, EF 40% // NSTEMI 3/19 [facility]) >> S-OM and L-LAD ok; S-RCA stent sub-total ISR >> PCI:  POBA  . Chronic systolic CHF (congestive heart failure) ([place])    Echo 9/18: Inferior akinesis, mild LVH, EF 40, status post TAVR with no perivalvular regurgitation, MAC, mild MR, moderate LAE, mild RAE, PASP 36 // Echo 3/19: EF 20-25, mild MR, AVR ok, mild to mod TR // Echo 6/19: Mild LVH, EF 30-35, diffuse HK, inferolateral and inferior AK, status post TAVR, no stenosis, no AI (mean gradient 5), MAC, mild MR, severe LAE, normal RVSF, moderate TR, PASP 50   . Complete heart block ([place])    a. s/p MDT CRTP 04/2016 Dr [name]  . GERD (gastroesophageal reflux disease)   . History of kidney stones 1970   /notes 01/03/2011  . HTN (hypertension)   . Hyperlipidemia   . Hypothyroidism   . Neuromuscular disorder ([place])   . Peripheral neuropathy   . Persistent atrial fibrillation ([place])   . Psoriasis    severe; with total skin exfoliation in the past resolved  .  S/P TAVR (transcatheter aortic valve replacement)    severe AS >> s/p TAVR 9/17 // Limited Echo 10/17: EF 40-45%, lat and inf-lat HK, TAVR ok, MAC, mild BAE, PASP 31 mmHg, no effusion  . Ventricular tachycardia, non-sustained (HCC)    Surgical Hx: The patient  has a past surgical history that includes decompression of left median nerve; Colonoscopy (11/23/2011); Tonsillectomy; Appendectomy; Total knee arthroplasty (04/16/2012); Cataract extraction, bilateral; Carpal tunnel release (Bilateral, 2009-2011); left heart catheterization with coronary angiogram (N/A, 12/27/2012); TEE without cardioversion (N/A, 04/21/2016); Cardiac catheterization (N/A,  04/27/2016); Transcatheter aortic valve replacement, transfemoral (N/A, 05/16/2016); TEE without cardioversion (N/A, 05/16/2016); Cardiac catheterization (N/A, 11/17/2015); Cardiac catheterization (N/A, 11/17/2015); Cardiac catheterization (N/A, 04/19/2016); Coronary angioplasty with stent; Coronary angioplasty with stent (08/09/2017); Coronary angioplasty with stent (11/17/2015); Lumbar laminectomy (Bilateral, 09/2004); Carotid endarterectomy (Left, 02/2008); Synovial cyst excision (09/2004); Back surgery; Joint replacement; Lumbar disc surgery; Anterior cervical decomp/discectomy fusion; Kidney stone surgery; Coronary artery bypass graft (1995); RIGHT/LEFT HEART CATH AND CORONARY/GRAFT ANGIOGRAPHY (N/A, 08/09/2017); CORONARY STENT INTERVENTION (N/A, 08/09/2017); Cardioversion (N/A, 03/27/2018); and Cardioversion (N/A, 05/02/2019).   Current Medications: Current Meds  Medication Sig  . allopurinol (ZYLOPRIM) 300 MG tablet Take 300 mg by mouth daily.  Marland Kitchen amiodarone (PACERONE) 200 MG tablet Take 1 tablet (200 mg total) by mouth daily.  Marland Kitchen apixaban (ELIQUIS) 5 MG TABS tablet Take 1 tablet (5 mg total) by mouth 2 (two) times daily.  Marland Kitchen atorvastatin (LIPITOR) 20 MG tablet Take 20 mg by mouth every evening.   . docusate sodium (COLACE) 100 MG capsule Take 100 mg by mouth 2 (two) times  daily.  . fluticasone (FLONASE) 50 MCG/ACT nasal spray Place 1 spray into both nostrils every evening.   . latanoprost (XALATAN) 0.005 % ophthalmic solution Place 1 drop into both eyes at bedtime.  Marland Kitchen levothyroxine (SYNTHROID, LEVOTHROID) 112 MCG tablet Take 112 mcg by mouth daily before breakfast.  . Magnesium 250 MG TABS Take 250 mg by mouth 2 (two) times daily.   . metoprolol tartrate (LOPRESSOR) 25 MG tablet Take 0.5 tablet (12.5 mg total) by mouth every morning.  . Multiple Vitamin (MULTIVITAMIN WITH MINERALS) TABS tablet Take 1 tablet by mouth at bedtime.  . nitroGLYCERIN (NITROSTAT) 0.4 MG SL tablet Place 0.4 mg under the tongue every 5 (five) minutes x 3 doses as needed for chest pain.   Vladimir Faster Glycol-Propyl Glycol (SYSTANE) 0.4-0.3 % SOLN Place 1 drop into both eyes every evening.  . potassium chloride SA (KLOR-CON M20) 20 MEQ tablet TAKE 2 TABS IN THE AM AND 1 TAB IN THE PM (Patient taking differently: Take 20-40 mEq by mouth See admin instructions. Take 2 tablets (40 meq) in the morning & take 1 tablet (20 meq) in the evening.)  . pyridOXINE (VITAMIN B-6) 100 MG tablet Take 100 mg by mouth daily.   . Tamsulosin HCl (FLOMAX) 0.4 MG CAPS Take 0.4 mg by mouth daily.   Marland Kitchen torsemide (DEMADEX) 20 MG tablet Take 2 tabs in the AM and 1 tab in the PM (Patient taking differently: Take 20-40 mg by mouth See admin instructions. Take 2 tablets (40 mg) by mouth in the morning & 1 tablet (20 mg) by mouth in the evening.)     Allergies:   Sulfamethoxazole, Other, and Propylene glycol   Social History   Tobacco Use  . Smoking status: Never Smoker  . Smokeless tobacco: Never Used  Substance Use Topics  . Alcohol use: Yes    Alcohol/week: 1.0 standard drinks    Types: 1 Cans of beer per week  . Drug use: No     Family Hx: The patient's family history includes Heart attack in his mother; Heart attack (age of onset: 47) in his son; Heart disease in his father and mother; Hyperlipidemia in his  father; Hypertension in his father. There is no history of Colon cancer.  ROS:   Please see the history of present illness.    ROS All other systems reviewed and are negative.   EKGs/Labs/Other Test Reviewed:    ICM DEVICE INTERROGATION 06/11/2019  EKG:  EKG is  ordered today.  The ekg ordered today demonstrates AV paced, HR 70  Recent Labs: 04/01/2019: ALT 23 04/29/2019: BUN 37; Creatinine, Ser 1.91; Hemoglobin 11.7; Platelets 120; Potassium 4.2; Sodium 137   Recent Lipid Panel Lab Results  Component Value Date/Time   CHOL 143 08/06/2006 08:14 AM   TRIG 141 08/06/2006 08:14 AM   HDL 29.7 (L) 08/06/2006 08:14 AM   CHOLHDL 4.8 CALC 08/06/2006 08:14 AM   LDLCALC 85 08/06/2006 08:14 AM    Physical Exam:    VS:  BP 124/62   Pulse 70   Ht _0  (1.727 m)   Wt 180 lb (81.6 kg)   BMI 27.37 kg/m     Wt Readings from Last 3 Encounters:  06/17/19 180 lb (81.6 kg)  05/30/19 196 lb 12.8 oz (89.3 kg)  05/02/19 176 lb 5.9 oz (80 kg)     Physical Exam  Constitutional: He is oriented to person, place, and time. He appears well-developed and well-nourished. No distress.  HENT:  Head: Normocephalic and atraumatic.  Eyes: No scleral icterus.  Neck: No thyromegaly present.  Cardiovascular: Normal rate and regular rhythm.  Murmur heard.  Systolic murmur is present with a grade of 2/6 at the upper right sternal border. Pulmonary/Chest: Effort normal and breath sounds normal. He has no wheezes. He has no rales.  Abdominal: Soft. There is no hepatomegaly.  Musculoskeletal:        General: Edema (1-2+ bilat LE edema) present.  Lymphadenopathy:    He has no cervical adenopathy.  Neurological: He is alert and oriented to person, place, and time.  Skin: Skin is warm and dry.  Psychiatric: He has a normal mood and affect.    ASSESSMENT & PLAN:    1. Shortness of breath He notes shortness of breath with activity.  On his recent device interrogation, it was noted that his rate  response was not turned on.  I asked device clinic to come see him in the office and his rate response was turned on.  I will have him come back in 3-4 weeks to see me or Dr. Burt Knack.  If his symptoms do not improve with this, we may need to consider stress testing (his anginal equivalent has always been shortness of breath).  I will also get a CBC, TSH, BNP today.  His recent Optivol was normal and his weights have been stable.  But, his legs are swollen.  If his BNP is high, I will adjust his Torsemide.    2. Chronic combined systolic and diastolic CHF (congestive heart failure) (HCC) EF 20-25.  NYHA 3a.  As noted, recent thoracic impedance normal.  Hopefully, turning on rate response will help his breathing.  If his BNP is high, I will adjust his Torsemide.  His BP has limited titration of his CHF meds in the past.  He is now only on once a day metoprolol tartrate.    3. Persistent atrial fibrillation ([place]) 4. On amiodarone therapy 5. Secondary hypercoagulable state ([place]) He is now on Amiodarone and maintaining NSR after his recent DCCV.  I will get surveillance labs to check his LFTs, TSH.  He has a lot of falls. Hopefully, we will be able to keep him on anticoagulation.  His recent Creatinine was 1.9.  He is 85 yo.  Obtain follow up CMET, TSH, CBC today.  If his Creatinine remains > 1.5, I will reduce his Eliquis to 2.5 mg twice daily.    6. Aortic  valve stenosis, etiology of cardiac valve disease unspecified 7. S/P TAVR (transcatheter aortic valve replacement) Normally functioning AVR by Echocardiogram in 2019.  Continue SBE prophylaxis.    8. Stage 3b chronic kidney disease Repeat CMET today.   9. Coronary artery disease involving coronary bypass graft of native heart without angina pectoris Hx of CABG and multiple subsequent PCI procedures.  He has had an anginal equivalent of shortness of breath in the past.  Hopefully, turning on his rate response will help his shortness of breath.  If  not, we may need to consider a stress test.  Given his age, comorbid illnesses and chronic kidney disease, hopefully we can avoid cardiac catheterization.    10. Postural syncope He has not had a recurrence. I have asked him to continue to be careful going from lying to standing.   11. Skin tear of right upper extremity His R forearm wound began to bleed in the office today and the RN with the device clinic helped re-dress it.  I have asked him to follow up with his PCP.  He can hold his Eliquis today and tomorrow and resume it on Thursday.      Total time spent with patient today 45 minutes. This includes reviewing records, evaluating the patient and coordinating care. Face-to-face time >50%.    Dispo:  Return in about 4 weeks (around 07/15/2019) for Close Follow Up, w/ Dr. Burt Knack, or [name], PA-C, in person.   Medication Adjustments/Labs and Tests Ordered: Current medicines are reviewed at length with the patient today.  Concerns regarding medicines are outlined above.  Tests Ordered: Orders Placed This Encounter  Procedures  . CBC  . TSH  . Basic metabolic panel  . Hepatic function panel  . Pro b natriuretic peptide  . EKG 12-Lead   Medication Changes: No orders of the defined types were placed in this encounter.   Signed, [name], PA-C  06/17/2019 4:39 PM    [facility] [address], [place], [place]  [address] Phone: [phone]; Fax: [phone]

## 2019-06-18 ENCOUNTER — Other Ambulatory Visit: Payer: Self-pay | Admitting: *Deleted

## 2019-06-18 DIAGNOSIS — Z79899 Other long term (current) drug therapy: Secondary | ICD-10-CM

## 2019-06-18 DIAGNOSIS — R0602 Shortness of breath: Secondary | ICD-10-CM

## 2019-06-18 DIAGNOSIS — I5042 Chronic combined systolic (congestive) and diastolic (congestive) heart failure: Secondary | ICD-10-CM

## 2019-06-18 DIAGNOSIS — I4819 Other persistent atrial fibrillation: Secondary | ICD-10-CM

## 2019-06-18 LAB — BASIC METABOLIC PANEL
BUN/Creatinine Ratio: 18 (ref 10–24)
BUN: 36 mg/dL — ABNORMAL HIGH (ref 8–27)
CO2: 25 mmol/L (ref 20–29)
Calcium: 9.2 mg/dL (ref 8.6–10.2)
Chloride: 100 mmol/L (ref 96–106)
Creatinine, Ser: 2.01 mg/dL — ABNORMAL HIGH (ref 0.76–1.27)
GFR calc Af Amer: 34 mL/min/{1.73_m2} — ABNORMAL LOW (ref >59)
GFR calc non Af Amer: 29 mL/min/{1.73_m2} — ABNORMAL LOW (ref >59)
Glucose: 109 mg/dL — ABNORMAL HIGH (ref 65–99)
Potassium: 4.7 mmol/L (ref 3.5–5.2)
Sodium: 141 mmol/L (ref 134–144)

## 2019-06-18 LAB — CBC
Hematocrit: 31.1 % — ABNORMAL LOW (ref 37.5–51.0)
Hemoglobin: 10.2 g/dL — ABNORMAL LOW (ref 13.0–17.7)
MCH: 32.9 pg (ref 26.6–33.0)
MCHC: 32.8 g/dL (ref 31.5–35.7)
MCV: 100 fL — ABNORMAL HIGH (ref 79–97)
Platelets: 141 10*3/uL — ABNORMAL LOW (ref 150–450)
RBC: 3.1 x10E6/uL — ABNORMAL LOW (ref 4.14–5.80)
RDW: 15.3 % (ref 11.6–15.4)
WBC: 7.5 10*3/uL (ref 3.4–10.8)

## 2019-06-18 LAB — HEPATIC FUNCTION PANEL
ALT: 44 IU/L (ref 0–44)
AST: 59 IU/L — ABNORMAL HIGH (ref 0–40)
Albumin: 4.3 g/dL (ref 3.6–4.6)
Alkaline Phosphatase: 216 IU/L — ABNORMAL HIGH (ref 39–117)
Bilirubin Total: 0.8 mg/dL (ref 0.0–1.2)
Bilirubin, Direct: 0.31 mg/dL (ref 0.00–0.40)
Total Protein: 7.1 g/dL (ref 6.0–8.5)

## 2019-06-18 LAB — PRO B NATRIURETIC PEPTIDE: NT-Pro BNP: 3986 pg/mL — ABNORMAL HIGH (ref 0–486)

## 2019-06-18 LAB — TSH: TSH: 8.81 u[IU]/mL — ABNORMAL HIGH (ref 0.450–4.500)

## 2019-06-18 MED ORDER — APIXABAN 2.5 MG PO TABS: 2.5000 mg | ORAL_TABLET | Freq: Two times a day (BID) | ORAL | 2 refills | Status: AC

## 2019-06-18 MED ORDER — LEVOTHYROXINE SODIUM 125 MCG PO TABS: 125.0000 ug | ORAL_TABLET | Freq: Every day | ORAL | 1 refills | Status: AC

## 2019-06-18 MED ORDER — TORSEMIDE 20 MG PO TABS: ORAL_TABLET | ORAL | 3 refills | Status: AC

## 2019-06-19 ENCOUNTER — Other Ambulatory Visit: Payer: Self-pay

## 2019-06-19 ENCOUNTER — Emergency Department (HOSPITAL_COMMUNITY)
Admission: EM | Admit: 2019-06-19 | Discharge: 2019-06-22 | DRG: 310 | Disposition: E | Payer: Medicare HMO | Attending: Internal Medicine | Admitting: Internal Medicine

## 2019-06-19 ENCOUNTER — Inpatient Hospital Stay (HOSPITAL_COMMUNITY): Payer: Medicare HMO | Attending: Hematology

## 2019-06-19 DIAGNOSIS — E039 Hypothyroidism, unspecified: Secondary | ICD-10-CM | POA: Diagnosis present

## 2019-06-19 DIAGNOSIS — I509 Heart failure, unspecified: Secondary | ICD-10-CM | POA: Diagnosis not present

## 2019-06-19 DIAGNOSIS — E1165 Type 2 diabetes mellitus with hyperglycemia: Secondary | ICD-10-CM | POA: Diagnosis not present

## 2019-06-19 DIAGNOSIS — Z96652 Presence of left artificial knee joint: Secondary | ICD-10-CM | POA: Diagnosis present

## 2019-06-19 DIAGNOSIS — I462 Cardiac arrest due to underlying cardiac condition: Secondary | ICD-10-CM | POA: Diagnosis present

## 2019-06-19 DIAGNOSIS — Z8349 Family history of other endocrine, nutritional and metabolic diseases: Secondary | ICD-10-CM | POA: Diagnosis not present

## 2019-06-19 DIAGNOSIS — Z950 Presence of cardiac pacemaker: Secondary | ICD-10-CM | POA: Diagnosis not present

## 2019-06-19 DIAGNOSIS — Z951 Presence of aortocoronary bypass graft: Secondary | ICD-10-CM

## 2019-06-19 DIAGNOSIS — I2510 Atherosclerotic heart disease of native coronary artery without angina pectoris: Secondary | ICD-10-CM | POA: Diagnosis present

## 2019-06-19 DIAGNOSIS — E785 Hyperlipidemia, unspecified: Secondary | ICD-10-CM | POA: Diagnosis present

## 2019-06-19 DIAGNOSIS — Z952 Presence of prosthetic heart valve: Secondary | ICD-10-CM

## 2019-06-19 DIAGNOSIS — Z79890 Hormone replacement therapy (postmenopausal): Secondary | ICD-10-CM

## 2019-06-19 DIAGNOSIS — I4819 Other persistent atrial fibrillation: Secondary | ICD-10-CM | POA: Diagnosis not present

## 2019-06-19 DIAGNOSIS — Z7901 Long term (current) use of anticoagulants: Secondary | ICD-10-CM

## 2019-06-19 DIAGNOSIS — D696 Thrombocytopenia, unspecified: Secondary | ICD-10-CM | POA: Diagnosis present

## 2019-06-19 DIAGNOSIS — I4901 Ventricular fibrillation: Secondary | ICD-10-CM | POA: Diagnosis present

## 2019-06-19 DIAGNOSIS — I469 Cardiac arrest, cause unspecified: Secondary | ICD-10-CM

## 2019-06-19 DIAGNOSIS — I10 Essential (primary) hypertension: Secondary | ICD-10-CM | POA: Diagnosis not present

## 2019-06-19 DIAGNOSIS — I499 Cardiac arrhythmia, unspecified: Secondary | ICD-10-CM | POA: Diagnosis not present

## 2019-06-19 DIAGNOSIS — R0681 Apnea, not elsewhere classified: Secondary | ICD-10-CM | POA: Diagnosis not present

## 2019-06-19 DIAGNOSIS — Z955 Presence of coronary angioplasty implant and graft: Secondary | ICD-10-CM | POA: Diagnosis not present

## 2019-06-19 DIAGNOSIS — R4182 Altered mental status, unspecified: Secondary | ICD-10-CM | POA: Diagnosis not present

## 2019-06-19 DIAGNOSIS — U071 COVID-19: Secondary | ICD-10-CM | POA: Diagnosis present

## 2019-06-19 DIAGNOSIS — Z981 Arthrodesis status: Secondary | ICD-10-CM | POA: Diagnosis not present

## 2019-06-19 DIAGNOSIS — R404 Transient alteration of awareness: Secondary | ICD-10-CM | POA: Diagnosis not present

## 2019-06-19 DIAGNOSIS — Z8249 Family history of ischemic heart disease and other diseases of the circulatory system: Secondary | ICD-10-CM | POA: Diagnosis not present

## 2019-06-19 DIAGNOSIS — E871 Hypo-osmolality and hyponatremia: Secondary | ICD-10-CM | POA: Diagnosis present

## 2019-06-19 DIAGNOSIS — J1289 Other viral pneumonia: Secondary | ICD-10-CM | POA: Diagnosis present

## 2019-06-19 DIAGNOSIS — D649 Anemia, unspecified: Secondary | ICD-10-CM | POA: Insufficient documentation

## 2019-06-19 LAB — CBC WITH DIFFERENTIAL/PLATELET
Abs Immature Granulocytes: 0.02 10*3/uL (ref 0.00–0.07)
Basophils Absolute: 0 10*3/uL (ref 0.0–0.1)
Basophils Relative: 0 %
Eosinophils Absolute: 0.1 10*3/uL (ref 0.0–0.5)
Eosinophils Relative: 2 %
HCT: 30.3 % — ABNORMAL LOW (ref 39.0–52.0)
Hemoglobin: 9.6 g/dL — ABNORMAL LOW (ref 13.0–17.0)
Immature Granulocytes: 0 %
Lymphocytes Relative: 12 %
Lymphs Abs: 0.7 10*3/uL (ref 0.7–4.0)
MCH: 32.9 pg (ref 26.0–34.0)
MCHC: 31.7 g/dL (ref 30.0–36.0)
MCV: 103.8 fL — ABNORMAL HIGH (ref 80.0–100.0)
Monocytes Absolute: 0.6 10*3/uL (ref 0.1–1.0)
Monocytes Relative: 11 %
Neutro Abs: 4.3 10*3/uL (ref 1.7–7.7)
Neutrophils Relative %: 75 %
Platelets: 128 10*3/uL — ABNORMAL LOW (ref 150–400)
RBC: 2.92 MIL/uL — ABNORMAL LOW (ref 4.22–5.81)
RDW: 17.3 % — ABNORMAL HIGH (ref 11.5–15.5)
WBC: 5.7 10*3/uL (ref 4.0–10.5)
nRBC: 0 % (ref 0.0–0.2)

## 2019-06-19 LAB — COMPREHENSIVE METABOLIC PANEL
ALT: 42 U/L (ref 0–44)
AST: 50 U/L — ABNORMAL HIGH (ref 15–41)
Albumin: 3.8 g/dL (ref 3.5–5.0)
Alkaline Phosphatase: 155 U/L — ABNORMAL HIGH (ref 38–126)
Anion gap: 12 (ref 5–15)
BUN: 34 mg/dL — ABNORMAL HIGH (ref 8–23)
CO2: 28 mmol/L (ref 22–32)
Calcium: 9.4 mg/dL (ref 8.9–10.3)
Chloride: 100 mmol/L (ref 98–111)
Creatinine, Ser: 1.98 mg/dL — ABNORMAL HIGH (ref 0.61–1.24)
GFR calc Af Amer: 34 mL/min — ABNORMAL LOW (ref >60)
GFR calc non Af Amer: 30 mL/min — ABNORMAL LOW (ref >60)
Glucose, Bld: 134 mg/dL — ABNORMAL HIGH (ref 70–99)
Potassium: 4.1 mmol/L (ref 3.5–5.1)
Sodium: 140 mmol/L (ref 135–145)
Total Bilirubin: 1.2 mg/dL (ref 0.3–1.2)
Total Protein: 7.3 g/dL (ref 6.5–8.1)

## 2019-06-19 LAB — IRON AND TIBC
Iron: 49 ug/dL (ref 45–182)
Saturation Ratios: 17 % — ABNORMAL LOW (ref 17.9–39.5)
TIBC: 291 ug/dL (ref 250–450)
UIBC: 242 ug/dL

## 2019-06-19 LAB — VITAMIN D 25 HYDROXY (VIT D DEFICIENCY, FRACTURES): Vit D, 25-Hydroxy: 38.07 ng/mL (ref 30–100)

## 2019-06-19 LAB — FERRITIN: Ferritin: 177 ng/mL (ref 24–336)

## 2019-06-19 LAB — FOLATE: Folate: 20.1 ng/mL (ref >5.9)

## 2019-06-19 LAB — LACTATE DEHYDROGENASE: LDH: 169 U/L (ref 98–192)

## 2019-06-19 LAB — CBG MONITORING, ED: Glucose-Capillary: 239 mg/dL — ABNORMAL HIGH (ref 70–99)

## 2019-06-19 LAB — VITAMIN B12: Vitamin B-12: 808 pg/mL (ref 180–914)

## 2019-06-19 MED ORDER — SODIUM BICARBONATE 8.4 % IV SOLN
INTRAVENOUS | Status: AC | PRN
  Administered 2019-06-19 (×2): 50 meq via INTRAVENOUS

## 2019-06-19 MED ORDER — CALCIUM CHLORIDE 10 % IV SOLN
INTRAVENOUS | Status: AC | PRN
  Administered 2019-06-19: 1 g via INTRAVENOUS

## 2019-06-19 MED ORDER — MAGNESIUM SULFATE 50 % IJ SOLN
INTRAMUSCULAR | Status: AC | PRN
  Administered 2019-06-19: 1 g via INTRAVENOUS

## 2019-06-19 MED ORDER — EPINEPHRINE 1 MG/10ML IJ SOSY
PREFILLED_SYRINGE | INTRAMUSCULAR | Status: AC | PRN
  Administered 2019-06-19 (×3): 1 via INTRAVENOUS

## 2019-06-20 MED FILL — Medication: Qty: 1 | Status: AC

## 2019-06-22 NOTE — ED Notes (Addendum)
rhythym check shows PEA.

## 2019-06-22 NOTE — Code Documentation (Signed)
US shows no true cardiac activity

## 2019-06-22 NOTE — Code Documentation (Signed)
Pulse check, no pulse,  Intubation attempt

## 2019-06-22 NOTE — Code Documentation (Signed)
Pulse check , PEA Resume CPR

## 2019-06-22 NOTE — ED Notes (Signed)
Pt with gag reflex.
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
## 2019-06-22 NOTE — Code Documentation (Addendum)
CPR in process  CPR started 1758 3 shocks by fire, 4 by EMS 7epis total, epi drip 1 sodium bicarb 450 emi  CBG 266  CO2 63 aginal w/ resp approx 8 16 L forarm

## 2019-06-22 NOTE — Code Documentation (Signed)
Stop CPR, Attempted intubation -

## 2019-06-22 NOTE — Progress Notes (Signed)
Chaplain rec'd call from Daniel. Patient died in Trauma C.  Family in Consult room while CPR was underway.  Chaplain accompanied doctors to inform patient's daughter, son and girlfriend that patient died.    Patient's son, [name], III will be handling arrangements.   [address], [place], [place]  Wish to have patient's body cremated by [facility].  Chaplain provided ministry of presence. And gave family patient placement card. Rev. Tamsen Snider Pager [phone]

## 2019-06-22 DEATH — deceased

## 2019-06-23 LAB — CUP PACEART INCLINIC DEVICE CHECK
Battery Remaining Longevity: 49 mo
Battery Voltage: 2.94 V
Brady Statistic AP VP Percent: 99.17 %
Brady Statistic AP VS Percent: 0.19 %
Brady Statistic AS VP Percent: 0.52 %
Brady Statistic AS VS Percent: 0.12 %
Brady Statistic RA Percent Paced: 99.44 %
Brady Statistic RV Percent Paced: 99.68 %
Date Time Interrogation Session: 20201027170155
Implantable Lead Implant Date: 20170907
Implantable Lead Implant Date: 20170907
Implantable Lead Implant Date: 20170907
Implantable Lead Location: 753858
Implantable Lead Location: 753859
Implantable Lead Location: 753860
Implantable Lead Model: 4598
Implantable Lead Model: 5076
Implantable Lead Model: 5076
Implantable Pulse Generator Implant Date: 20170907
Lead Channel Impedance Value: 285 Ohm
Lead Channel Impedance Value: 304 Ohm
Lead Channel Impedance Value: 342 Ohm
Lead Channel Impedance Value: 380 Ohm
Lead Channel Impedance Value: 399 Ohm
Lead Channel Impedance Value: 494 Ohm
Lead Channel Impedance Value: 513 Ohm
Lead Channel Impedance Value: 589 Ohm
Lead Channel Impedance Value: 589 Ohm
Lead Channel Impedance Value: 608 Ohm
Lead Channel Pacing Threshold Amplitude: 0.75 V
Lead Channel Pacing Threshold Amplitude: 1.375 V
Lead Channel Pacing Threshold Amplitude: 1.5 V
Lead Channel Pacing Threshold Pulse Width: 0.4 ms
Lead Channel Pacing Threshold Pulse Width: 0.4 ms
Lead Channel Pacing Threshold Pulse Width: 0.8 ms
Lead Channel Sensing Intrinsic Amplitude: 1.875 mV
Lead Channel Sensing Intrinsic Amplitude: 1.875 mV
Lead Channel Sensing Intrinsic Amplitude: 12.25 mV
Lead Channel Sensing Intrinsic Amplitude: 12.25 mV
Lead Channel Setting Pacing Amplitude: 2 V
Lead Channel Setting Pacing Amplitude: 2.5 V
Lead Channel Setting Pacing Amplitude: 3 V
Lead Channel Setting Pacing Pulse Width: 0.4 ms
Lead Channel Setting Pacing Pulse Width: 0.8 ms
Lead Channel Setting Sensing Sensitivity: 0.9 mV

## 2019-06-26 ENCOUNTER — Ambulatory Visit (HOSPITAL_COMMUNITY): Payer: Medicare HMO | Admitting: Nurse Practitioner

## 2019-07-15 ENCOUNTER — Ambulatory Visit: Payer: Medicare HMO | Admitting: Physician Assistant

## 2019-07-31 ENCOUNTER — Encounter: Payer: Medicare HMO | Admitting: Internal Medicine
